# Patient Record
Sex: Male | Born: 1935 | Race: White | Hispanic: No | Marital: Single | Smoking: Former smoker
Health system: Southern US, Community
[De-identification: ages and names within clinical notes are randomized; demographics above are authoritative.]

## PROBLEM LIST (undated history)

## (undated) DIAGNOSIS — K8020 Calculus of gallbladder without cholecystitis without obstruction: Secondary | ICD-10-CM

## (undated) DIAGNOSIS — C320 Malignant neoplasm of glottis: Secondary | ICD-10-CM

## (undated) DIAGNOSIS — E785 Hyperlipidemia, unspecified: Secondary | ICD-10-CM

## (undated) DIAGNOSIS — D649 Anemia, unspecified: Secondary | ICD-10-CM

## (undated) DIAGNOSIS — M1990 Unspecified osteoarthritis, unspecified site: Secondary | ICD-10-CM

## (undated) DIAGNOSIS — K449 Diaphragmatic hernia without obstruction or gangrene: Secondary | ICD-10-CM

## (undated) DIAGNOSIS — I10 Essential (primary) hypertension: Secondary | ICD-10-CM

## (undated) DIAGNOSIS — J189 Pneumonia, unspecified organism: Secondary | ICD-10-CM

## (undated) DIAGNOSIS — Z923 Personal history of irradiation: Secondary | ICD-10-CM

## (undated) DIAGNOSIS — I4891 Unspecified atrial fibrillation: Secondary | ICD-10-CM

## (undated) DIAGNOSIS — I272 Pulmonary hypertension, unspecified: Secondary | ICD-10-CM

## (undated) DIAGNOSIS — H269 Unspecified cataract: Secondary | ICD-10-CM

## (undated) DIAGNOSIS — C61 Malignant neoplasm of prostate: Secondary | ICD-10-CM

## (undated) DIAGNOSIS — S72002A Fracture of unspecified part of neck of left femur, initial encounter for closed fracture: Secondary | ICD-10-CM

## (undated) DIAGNOSIS — K222 Esophageal obstruction: Secondary | ICD-10-CM

## (undated) DIAGNOSIS — C44310 Basal cell carcinoma of skin of unspecified parts of face: Secondary | ICD-10-CM

## (undated) DIAGNOSIS — I69991 Dysphagia following unspecified cerebrovascular disease: Secondary | ICD-10-CM

## (undated) DIAGNOSIS — Z87438 Personal history of other diseases of male genital organs: Secondary | ICD-10-CM

## (undated) DIAGNOSIS — E871 Hypo-osmolality and hyponatremia: Secondary | ICD-10-CM

## (undated) DIAGNOSIS — D61818 Other pancytopenia: Secondary | ICD-10-CM

## (undated) DIAGNOSIS — E876 Hypokalemia: Secondary | ICD-10-CM

## (undated) DIAGNOSIS — K219 Gastro-esophageal reflux disease without esophagitis: Secondary | ICD-10-CM

## (undated) HISTORY — PX: TONSILLECTOMY: SUR1361

## (undated) HISTORY — DX: Malignant neoplasm of glottis: C32.0

## (undated) HISTORY — PX: BACK SURGERY: SHX140

## (undated) HISTORY — DX: Unspecified cataract: H26.9

## (undated) HISTORY — DX: Hyperlipidemia, unspecified: E78.5

## (undated) HISTORY — DX: Other pancytopenia: D61.818

## (undated) HISTORY — DX: Unspecified atrial fibrillation: I48.91

## (undated) HISTORY — DX: Pneumonia, unspecified organism: J18.9

## (undated) HISTORY — DX: Hypo-osmolality and hyponatremia: E87.1

## (undated) HISTORY — PX: APPENDECTOMY: SHX54

## (undated) HISTORY — DX: Dysphagia following unspecified cerebrovascular disease: I69.991

## (undated) HISTORY — PX: OTHER SURGICAL HISTORY: SHX169

## (undated) HISTORY — DX: Unspecified osteoarthritis, unspecified site: M19.90

## (undated) HISTORY — DX: Calculus of gallbladder without cholecystitis without obstruction: K80.20

## (undated) HISTORY — PX: ESOPHAGEAL DILATION: SHX303

## (undated) HISTORY — DX: Personal history of irradiation: Z92.3

## (undated) HISTORY — DX: Esophageal obstruction: K22.2

## (undated) HISTORY — DX: Basal cell carcinoma of skin of unspecified parts of face: C44.310

## (undated) HISTORY — DX: Hypokalemia: E87.6

## (undated) HISTORY — DX: Anemia, unspecified: D64.9

## (undated) HISTORY — DX: Malignant neoplasm of prostate: C61

## (undated) HISTORY — DX: Other disorders of bilirubin metabolism: E80.6

## (undated) HISTORY — DX: Essential (primary) hypertension: I10

## (undated) HISTORY — PX: COLONOSCOPY: SHX174

## (undated) HISTORY — PX: HIP SURGERY: SHX245

## (undated) HISTORY — DX: Pulmonary hypertension, unspecified: I27.20

## (undated) HISTORY — DX: Diaphragmatic hernia without obstruction or gangrene: K44.9

## (undated) HISTORY — PX: UPPER GASTROINTESTINAL ENDOSCOPY: SHX188

## (undated) HISTORY — DX: Personal history of other diseases of male genital organs: Z87.438

## (undated) HISTORY — DX: Fracture of unspecified part of neck of left femur, initial encounter for closed fracture: S72.002A

## (undated) HISTORY — DX: Gastro-esophageal reflux disease without esophagitis: K21.9

---

## 2004-01-24 ENCOUNTER — Ambulatory Visit (HOSPITAL_COMMUNITY): Admission: RE | Admit: 2004-01-24 | Discharge: 2004-01-24 | Payer: Self-pay | Admitting: Neurological Surgery

## 2004-01-31 ENCOUNTER — Inpatient Hospital Stay (HOSPITAL_COMMUNITY): Admission: RE | Admit: 2004-01-31 | Discharge: 2004-02-01 | Payer: Self-pay | Admitting: Neurological Surgery

## 2004-04-19 ENCOUNTER — Ambulatory Visit (HOSPITAL_COMMUNITY): Admission: RE | Admit: 2004-04-19 | Discharge: 2004-04-19 | Payer: Self-pay | Admitting: Orthopedic Surgery

## 2004-04-19 ENCOUNTER — Ambulatory Visit (HOSPITAL_BASED_OUTPATIENT_CLINIC_OR_DEPARTMENT_OTHER): Admission: RE | Admit: 2004-04-19 | Discharge: 2004-04-19 | Payer: Self-pay | Admitting: Orthopedic Surgery

## 2005-06-13 ENCOUNTER — Ambulatory Visit (HOSPITAL_BASED_OUTPATIENT_CLINIC_OR_DEPARTMENT_OTHER): Admission: RE | Admit: 2005-06-13 | Discharge: 2005-06-13 | Payer: Self-pay | Admitting: Orthopedic Surgery

## 2006-06-02 ENCOUNTER — Encounter: Admission: RE | Admit: 2006-06-02 | Discharge: 2006-06-02 | Payer: Self-pay | Admitting: General Surgery

## 2006-12-16 ENCOUNTER — Inpatient Hospital Stay (HOSPITAL_COMMUNITY): Admission: RE | Admit: 2006-12-16 | Discharge: 2006-12-23 | Payer: Self-pay | Admitting: Orthopedic Surgery

## 2009-01-16 ENCOUNTER — Encounter (INDEPENDENT_AMBULATORY_CARE_PROVIDER_SITE_OTHER): Payer: Self-pay | Admitting: General Surgery

## 2009-01-16 ENCOUNTER — Ambulatory Visit (HOSPITAL_BASED_OUTPATIENT_CLINIC_OR_DEPARTMENT_OTHER): Admission: RE | Admit: 2009-01-16 | Discharge: 2009-01-16 | Payer: Self-pay | Admitting: General Surgery

## 2009-03-15 ENCOUNTER — Encounter: Admission: RE | Admit: 2009-03-15 | Discharge: 2009-03-15 | Payer: Self-pay | Admitting: General Surgery

## 2009-03-21 ENCOUNTER — Encounter: Admission: RE | Admit: 2009-03-21 | Discharge: 2009-03-21 | Payer: Self-pay | Admitting: General Surgery

## 2009-03-29 ENCOUNTER — Ambulatory Visit: Payer: Self-pay | Admitting: Thoracic Surgery

## 2009-04-04 ENCOUNTER — Ambulatory Visit (HOSPITAL_COMMUNITY): Admission: RE | Admit: 2009-04-04 | Discharge: 2009-04-04 | Payer: Self-pay | Admitting: Thoracic Surgery

## 2009-04-17 ENCOUNTER — Inpatient Hospital Stay (HOSPITAL_COMMUNITY): Admission: RE | Admit: 2009-04-17 | Discharge: 2009-04-23 | Payer: Self-pay | Admitting: Thoracic Surgery

## 2009-04-17 ENCOUNTER — Ambulatory Visit: Payer: Self-pay | Admitting: Thoracic Surgery

## 2009-04-17 ENCOUNTER — Encounter: Payer: Self-pay | Admitting: Thoracic Surgery

## 2009-04-20 ENCOUNTER — Ambulatory Visit: Payer: Self-pay | Admitting: Oncology

## 2009-04-21 ENCOUNTER — Ambulatory Visit: Payer: Self-pay | Admitting: Oncology

## 2009-04-27 ENCOUNTER — Encounter: Admission: RE | Admit: 2009-04-27 | Discharge: 2009-04-27 | Payer: Self-pay | Admitting: Thoracic Surgery

## 2009-04-27 ENCOUNTER — Ambulatory Visit: Payer: Self-pay | Admitting: Thoracic Surgery

## 2009-05-05 ENCOUNTER — Encounter: Payer: Self-pay | Admitting: Oncology

## 2009-05-05 ENCOUNTER — Other Ambulatory Visit: Admission: RE | Admit: 2009-05-05 | Discharge: 2009-05-05 | Payer: Self-pay | Admitting: Oncology

## 2009-05-17 ENCOUNTER — Encounter: Admission: RE | Admit: 2009-05-17 | Discharge: 2009-05-17 | Payer: Self-pay | Admitting: Thoracic Surgery

## 2009-05-17 ENCOUNTER — Ambulatory Visit: Payer: Self-pay | Admitting: Thoracic Surgery

## 2009-06-20 ENCOUNTER — Ambulatory Visit: Payer: Self-pay | Admitting: Oncology

## 2009-06-28 ENCOUNTER — Ambulatory Visit: Payer: Self-pay | Admitting: Thoracic Surgery

## 2009-06-28 ENCOUNTER — Encounter: Admission: RE | Admit: 2009-06-28 | Discharge: 2009-06-28 | Payer: Self-pay | Admitting: Thoracic Surgery

## 2009-07-04 LAB — CBC WITH DIFFERENTIAL/PLATELET
BASO%: 0.3 % (ref 0.0–2.0)
HCT: 38.2 % — ABNORMAL LOW (ref 38.4–49.9)
MCHC: 34.5 g/dL (ref 32.0–36.0)
MONO#: 0.6 10*3/uL (ref 0.1–0.9)
NEUT%: 58.9 % (ref 39.0–75.0)
RBC: 4.13 10*6/uL — ABNORMAL LOW (ref 4.20–5.82)
RDW: 14.2 % (ref 11.0–14.6)
WBC: 6 10*3/uL (ref 4.0–10.3)
lymph#: 1.8 10*3/uL (ref 0.9–3.3)

## 2009-07-05 LAB — COMPREHENSIVE METABOLIC PANEL
BUN: 18 mg/dL (ref 6–23)
CO2: 25 mEq/L (ref 19–32)
Calcium: 9.9 mg/dL (ref 8.4–10.5)
Chloride: 107 mEq/L (ref 96–112)
Creatinine, Ser: 0.94 mg/dL (ref 0.40–1.50)

## 2009-07-05 LAB — KAPPA/LAMBDA LIGHT CHAINS
Kappa free light chain: 0.7 mg/dL (ref 0.33–1.94)
Kappa:Lambda Ratio: 0 — ABNORMAL LOW (ref 0.26–1.65)

## 2009-08-29 ENCOUNTER — Ambulatory Visit: Payer: Self-pay | Admitting: Oncology

## 2009-08-29 LAB — CBC WITH DIFFERENTIAL/PLATELET
BASO%: 0.2 % (ref 0.0–2.0)
EOS%: 1.3 % (ref 0.0–7.0)
Eosinophils Absolute: 0.1 10*3/uL (ref 0.0–0.5)
LYMPH%: 27 % (ref 14.0–49.0)
MCH: 32.3 pg (ref 27.2–33.4)
MCHC: 35.1 g/dL (ref 32.0–36.0)
MCV: 92.2 fL (ref 79.3–98.0)
MONO%: 12.6 % (ref 0.0–14.0)
Platelets: 164 10*3/uL (ref 140–400)
RBC: 3.73 10*6/uL — ABNORMAL LOW (ref 4.20–5.82)

## 2009-08-30 ENCOUNTER — Ambulatory Visit (HOSPITAL_COMMUNITY): Admission: RE | Admit: 2009-08-30 | Discharge: 2009-08-30 | Payer: Self-pay | Admitting: Oncology

## 2009-08-30 LAB — COMPREHENSIVE METABOLIC PANEL
CO2: 26 mEq/L (ref 19–32)
Creatinine, Ser: 1.23 mg/dL (ref 0.40–1.50)
Glucose, Bld: 93 mg/dL (ref 70–99)
Sodium: 137 mEq/L (ref 135–145)
Total Bilirubin: 0.9 mg/dL (ref 0.3–1.2)
Total Protein: 7.1 g/dL (ref 6.0–8.3)

## 2009-08-30 LAB — KAPPA/LAMBDA LIGHT CHAINS
Kappa free light chain: 0.65 mg/dL (ref 0.33–1.94)
Lambda Free Lght Chn: 298 mg/dL — ABNORMAL HIGH (ref 0.57–2.63)

## 2009-09-05 LAB — BETA 2 MICROGLOBULIN, SERUM: Beta-2 Microglobulin: 5.36 mg/L — ABNORMAL HIGH (ref 1.01–1.73)

## 2009-09-05 LAB — BASIC METABOLIC PANEL
BUN: 11 mg/dL (ref 6–23)
Potassium: 4.1 mEq/L (ref 3.5–5.3)

## 2009-09-27 ENCOUNTER — Encounter: Admission: RE | Admit: 2009-09-27 | Discharge: 2009-09-27 | Payer: Self-pay | Admitting: Thoracic Surgery

## 2009-09-27 ENCOUNTER — Ambulatory Visit: Payer: Self-pay | Admitting: Thoracic Surgery

## 2009-09-27 LAB — CBC WITH DIFFERENTIAL/PLATELET
Basophils Absolute: 0 10*3/uL (ref 0.0–0.1)
Eosinophils Absolute: 0.4 10*3/uL (ref 0.0–0.5)
HGB: 12 g/dL — ABNORMAL LOW (ref 13.0–17.1)
LYMPH%: 18.2 % (ref 14.0–49.0)
MCH: 30.9 pg (ref 27.2–33.4)
MCV: 92.3 fL (ref 79.3–98.0)
MONO%: 15.9 % — ABNORMAL HIGH (ref 0.0–14.0)
NEUT#: 5.4 10*3/uL (ref 1.5–6.5)
NEUT%: 61.6 % (ref 39.0–75.0)
Platelets: 129 10*3/uL — ABNORMAL LOW (ref 140–400)

## 2009-10-11 ENCOUNTER — Ambulatory Visit: Payer: Self-pay | Admitting: Oncology

## 2009-10-13 LAB — CBC WITH DIFFERENTIAL/PLATELET
Basophils Absolute: 0 10*3/uL (ref 0.0–0.1)
EOS%: 0 % (ref 0.0–7.0)
Eosinophils Absolute: 0 10*3/uL (ref 0.0–0.5)
HCT: 34.2 % — ABNORMAL LOW (ref 38.4–49.9)
HGB: 11.8 g/dL — ABNORMAL LOW (ref 13.0–17.1)
LYMPH%: 9.4 % — ABNORMAL LOW (ref 14.0–49.0)
MCH: 32.2 pg (ref 27.2–33.4)
MCV: 93.3 fL (ref 79.3–98.0)
MONO%: 7.2 % (ref 0.0–14.0)
NEUT#: 12.9 10*3/uL — ABNORMAL HIGH (ref 1.5–6.5)
NEUT%: 83.3 % — ABNORMAL HIGH (ref 39.0–75.0)
Platelets: 228 10*3/uL (ref 140–400)
RDW: 16.5 % — ABNORMAL HIGH (ref 11.0–14.6)

## 2009-10-30 LAB — CBC WITH DIFFERENTIAL/PLATELET
EOS%: 14.5 % — ABNORMAL HIGH (ref 0.0–7.0)
Eosinophils Absolute: 1 10*3/uL — ABNORMAL HIGH (ref 0.0–0.5)
LYMPH%: 27.4 % (ref 14.0–49.0)
MCH: 31.8 pg (ref 27.2–33.4)
MCV: 93.7 fL (ref 79.3–98.0)
MONO%: 18.2 % — ABNORMAL HIGH (ref 0.0–14.0)
NEUT#: 2.7 10*3/uL (ref 1.5–6.5)
Platelets: 104 10*3/uL — ABNORMAL LOW (ref 140–400)
RBC: 3.95 10*6/uL — ABNORMAL LOW (ref 4.20–5.82)
RDW: 16.3 % — ABNORMAL HIGH (ref 11.0–14.6)

## 2009-10-30 LAB — COMPREHENSIVE METABOLIC PANEL
AST: 15 U/L (ref 0–37)
Alkaline Phosphatase: 81 U/L (ref 39–117)
BUN: 24 mg/dL — ABNORMAL HIGH (ref 6–23)
Glucose, Bld: 92 mg/dL (ref 70–99)
Sodium: 139 mEq/L (ref 135–145)
Total Bilirubin: 0.8 mg/dL (ref 0.3–1.2)

## 2009-11-08 ENCOUNTER — Ambulatory Visit: Payer: Self-pay | Admitting: Oncology

## 2009-11-08 LAB — CBC WITH DIFFERENTIAL/PLATELET
BASO%: 0.5 % (ref 0.0–2.0)
EOS%: 0.5 % (ref 0.0–7.0)
HCT: 36.2 % — ABNORMAL LOW (ref 38.4–49.9)
HGB: 12.3 g/dL — ABNORMAL LOW (ref 13.0–17.1)
MCH: 31.8 pg (ref 27.2–33.4)
MCHC: 34.1 g/dL (ref 32.0–36.0)
MONO#: 0.8 10*3/uL (ref 0.1–0.9)
NEUT%: 55.6 % (ref 39.0–75.0)
RDW: 16.2 % — ABNORMAL HIGH (ref 11.0–14.6)
WBC: 6.8 10*3/uL (ref 4.0–10.3)
lymph#: 2.2 10*3/uL (ref 0.9–3.3)

## 2009-11-09 LAB — BETA 2 MICROGLOBULIN, SERUM: Beta-2 Microglobulin: 2.24 mg/L — ABNORMAL HIGH (ref 1.01–1.73)

## 2009-11-09 LAB — KAPPA/LAMBDA LIGHT CHAINS
Kappa free light chain: 1.84 mg/dL (ref 0.33–1.94)
Kappa:Lambda Ratio: 0.3 (ref 0.26–1.65)
Lambda Free Lght Chn: 6.04 mg/dL — ABNORMAL HIGH (ref 0.57–2.63)

## 2009-11-30 LAB — BASIC METABOLIC PANEL
CO2: 27 mEq/L (ref 19–32)
Chloride: 101 mEq/L (ref 96–112)
Creatinine, Ser: 0.99 mg/dL (ref 0.40–1.50)
Potassium: 3.7 mEq/L (ref 3.5–5.3)
Sodium: 135 mEq/L (ref 135–145)

## 2009-11-30 LAB — CBC WITH DIFFERENTIAL/PLATELET
BASO%: 0.5 % (ref 0.0–2.0)
Basophils Absolute: 0 10*3/uL (ref 0.0–0.1)
EOS%: 6.6 % (ref 0.0–7.0)
HGB: 12.7 g/dL — ABNORMAL LOW (ref 13.0–17.1)
MCH: 32 pg (ref 27.2–33.4)
MCHC: 34.4 g/dL (ref 32.0–36.0)
MCV: 93.1 fL (ref 79.3–98.0)
MONO%: 15.1 % — ABNORMAL HIGH (ref 0.0–14.0)
RBC: 3.98 10*6/uL — ABNORMAL LOW (ref 4.20–5.82)
RDW: 16.6 % — ABNORMAL HIGH (ref 11.0–14.6)
lymph#: 1.4 10*3/uL (ref 0.9–3.3)

## 2009-12-29 ENCOUNTER — Ambulatory Visit: Payer: Self-pay | Admitting: Oncology

## 2009-12-29 LAB — CBC WITH DIFFERENTIAL/PLATELET
BASO%: 0.2 % (ref 0.0–2.0)
EOS%: 7 % (ref 0.0–7.0)
LYMPH%: 36.2 % (ref 14.0–49.0)
MCHC: 33.2 g/dL (ref 32.0–36.0)
MCV: 92.3 fL (ref 79.3–98.0)
MONO%: 23.7 % — ABNORMAL HIGH (ref 0.0–14.0)
Platelets: 117 10*3/uL — ABNORMAL LOW (ref 140–400)
RBC: 4.04 10*6/uL — ABNORMAL LOW (ref 4.20–5.82)
WBC: 4 10*3/uL (ref 4.0–10.3)
nRBC: 0 % (ref 0–0)

## 2010-01-01 LAB — KAPPA/LAMBDA LIGHT CHAINS: Kappa free light chain: 1.78 mg/dL (ref 0.33–1.94)

## 2010-01-01 LAB — COMPREHENSIVE METABOLIC PANEL
ALT: 20 U/L (ref 0–53)
AST: 16 U/L (ref 0–37)
Alkaline Phosphatase: 94 U/L (ref 39–117)
Creatinine, Ser: 1.1 mg/dL (ref 0.40–1.50)
Total Bilirubin: 0.8 mg/dL (ref 0.3–1.2)

## 2010-01-03 LAB — CBC WITH DIFFERENTIAL/PLATELET
Eosinophils Absolute: 0.1 10*3/uL (ref 0.0–0.5)
HCT: 34.7 % — ABNORMAL LOW (ref 38.4–49.9)
LYMPH%: 30.9 % (ref 14.0–49.0)
MONO#: 0.6 10*3/uL (ref 0.1–0.9)
NEUT#: 3.4 10*3/uL (ref 1.5–6.5)
Platelets: 149 10*3/uL (ref 140–400)
RBC: 3.81 10*6/uL — ABNORMAL LOW (ref 4.20–5.82)
WBC: 6 10*3/uL (ref 4.0–10.3)

## 2010-01-24 ENCOUNTER — Ambulatory Visit: Payer: Self-pay | Admitting: Thoracic Surgery

## 2010-01-24 ENCOUNTER — Encounter: Admission: RE | Admit: 2010-01-24 | Discharge: 2010-01-24 | Payer: Self-pay | Admitting: Thoracic Surgery

## 2010-01-26 LAB — CBC WITH DIFFERENTIAL/PLATELET
BASO%: 0.1 % (ref 0.0–2.0)
HCT: 34.4 % — ABNORMAL LOW (ref 38.4–49.9)
LYMPH%: 31.1 % (ref 14.0–49.0)
MCH: 32.4 pg (ref 27.2–33.4)
MCHC: 34.8 g/dL (ref 32.0–36.0)
MCV: 92.9 fL (ref 79.3–98.0)
MONO#: 0.6 10*3/uL (ref 0.1–0.9)
MONO%: 17.2 % — ABNORMAL HIGH (ref 0.0–14.0)
NEUT%: 46.3 % (ref 39.0–75.0)
Platelets: 97 10*3/uL — ABNORMAL LOW (ref 140–400)
WBC: 3.5 10*3/uL — ABNORMAL LOW (ref 4.0–10.3)

## 2010-01-26 LAB — COMPREHENSIVE METABOLIC PANEL
ALT: 20 U/L (ref 0–53)
CO2: 28 mEq/L (ref 19–32)
Creatinine, Ser: 1.01 mg/dL (ref 0.40–1.50)
Glucose, Bld: 98 mg/dL (ref 70–99)
Total Bilirubin: 0.8 mg/dL (ref 0.3–1.2)

## 2010-01-29 ENCOUNTER — Ambulatory Visit: Payer: Self-pay | Admitting: Oncology

## 2010-01-31 LAB — CBC WITH DIFFERENTIAL/PLATELET
Eosinophils Absolute: 0.1 10*3/uL (ref 0.0–0.5)
HCT: 35.5 % — ABNORMAL LOW (ref 38.4–49.9)
LYMPH%: 32 % (ref 14.0–49.0)
MONO#: 0.6 10*3/uL (ref 0.1–0.9)
NEUT#: 2.9 10*3/uL (ref 1.5–6.5)
NEUT%: 55.5 % (ref 39.0–75.0)
Platelets: 145 10*3/uL (ref 140–400)
WBC: 5.3 10*3/uL (ref 4.0–10.3)

## 2010-02-19 ENCOUNTER — Ambulatory Visit (HOSPITAL_COMMUNITY): Admission: RE | Admit: 2010-02-19 | Discharge: 2010-02-19 | Payer: Self-pay | Admitting: Oncology

## 2010-02-26 LAB — CBC WITH DIFFERENTIAL/PLATELET
BASO%: 0.4 % (ref 0.0–2.0)
EOS%: 4.4 % (ref 0.0–7.0)
MCH: 33.2 pg (ref 27.2–33.4)
MCHC: 35.5 g/dL (ref 32.0–36.0)
MONO#: 0.8 10*3/uL (ref 0.1–0.9)
RDW: 18.5 % — ABNORMAL HIGH (ref 11.0–14.6)
WBC: 4.8 10*3/uL (ref 4.0–10.3)
lymph#: 1.3 10*3/uL (ref 0.9–3.3)

## 2010-02-26 LAB — COMPREHENSIVE METABOLIC PANEL
ALT: 23 U/L (ref 0–53)
AST: 17 U/L (ref 0–37)
Albumin: 3.4 g/dL — ABNORMAL LOW (ref 3.5–5.2)
CO2: 29 mEq/L (ref 19–32)
Calcium: 9.2 mg/dL (ref 8.4–10.5)
Chloride: 106 mEq/L (ref 96–112)
Potassium: 3.6 mEq/L (ref 3.5–5.3)
Sodium: 139 mEq/L (ref 135–145)
Total Protein: 6 g/dL (ref 6.0–8.3)

## 2010-03-15 ENCOUNTER — Ambulatory Visit: Payer: Self-pay | Admitting: Oncology

## 2010-03-19 LAB — BASIC METABOLIC PANEL
CO2: 24 mEq/L (ref 19–32)
Calcium: 9.5 mg/dL (ref 8.4–10.5)
Creatinine, Ser: 1.13 mg/dL (ref 0.40–1.50)
Glucose, Bld: 88 mg/dL (ref 70–99)
Sodium: 135 mEq/L (ref 135–145)

## 2010-03-19 LAB — CBC WITH DIFFERENTIAL/PLATELET
BASO%: 0.5 % (ref 0.0–2.0)
Eosinophils Absolute: 0.1 10*3/uL (ref 0.0–0.5)
HCT: 34.8 % — ABNORMAL LOW (ref 38.4–49.9)
LYMPH%: 34.5 % (ref 14.0–49.0)
MCHC: 34.8 g/dL (ref 32.0–36.0)
MCV: 93.7 fL (ref 79.3–98.0)
MONO#: 0.6 10*3/uL (ref 0.1–0.9)
MONO%: 14.5 % — ABNORMAL HIGH (ref 0.0–14.0)
NEUT%: 49.1 % (ref 39.0–75.0)
Platelets: 108 10*3/uL — ABNORMAL LOW (ref 140–400)
RBC: 3.71 10*6/uL — ABNORMAL LOW (ref 4.20–5.82)
WBC: 4.3 10*3/uL (ref 4.0–10.3)

## 2010-04-09 LAB — COMPREHENSIVE METABOLIC PANEL
ALT: 23 U/L (ref 0–53)
AST: 16 U/L (ref 0–37)
Alkaline Phosphatase: 59 U/L (ref 39–117)
CO2: 28 mEq/L (ref 19–32)
Creatinine, Ser: 0.95 mg/dL (ref 0.40–1.50)
Sodium: 137 mEq/L (ref 135–145)
Total Bilirubin: 0.8 mg/dL (ref 0.3–1.2)
Total Protein: 6 g/dL (ref 6.0–8.3)

## 2010-04-09 LAB — CBC WITH DIFFERENTIAL/PLATELET
BASO%: 0.2 % (ref 0.0–2.0)
EOS%: 4.2 % (ref 0.0–7.0)
LYMPH%: 33.5 % (ref 14.0–49.0)
MCH: 32.9 pg (ref 27.2–33.4)
MCHC: 34.7 g/dL (ref 32.0–36.0)
MONO#: 0.6 10*3/uL (ref 0.1–0.9)
Platelets: 99 10*3/uL — ABNORMAL LOW (ref 140–400)
RBC: 3.72 10*6/uL — ABNORMAL LOW (ref 4.20–5.82)
WBC: 4.4 10*3/uL (ref 4.0–10.3)

## 2010-04-10 LAB — KAPPA/LAMBDA LIGHT CHAINS: Kappa free light chain: 1.11 mg/dL (ref 0.33–1.94)

## 2010-04-18 ENCOUNTER — Ambulatory Visit: Payer: Self-pay | Admitting: Oncology

## 2010-04-19 LAB — CBC WITH DIFFERENTIAL/PLATELET
Basophils Absolute: 0 10*3/uL (ref 0.0–0.1)
Eosinophils Absolute: 0 10*3/uL (ref 0.0–0.5)
HCT: 37.4 % — ABNORMAL LOW (ref 38.4–49.9)
HGB: 12.7 g/dL — ABNORMAL LOW (ref 13.0–17.1)
LYMPH%: 17 % (ref 14.0–49.0)
MCV: 93.7 fL (ref 79.3–98.0)
MONO#: 0 10*3/uL — ABNORMAL LOW (ref 0.1–0.9)
MONO%: 0.8 % (ref 0.0–14.0)
NEUT#: 3.3 10*3/uL (ref 1.5–6.5)
NEUT%: 82 % — ABNORMAL HIGH (ref 39.0–75.0)
Platelets: 143 10*3/uL (ref 140–400)
WBC: 4 10*3/uL (ref 4.0–10.3)

## 2010-05-15 LAB — BASIC METABOLIC PANEL
BUN: 12 mg/dL (ref 6–23)
Chloride: 103 mEq/L (ref 96–112)
Glucose, Bld: 105 mg/dL — ABNORMAL HIGH (ref 70–99)
Potassium: 4 mEq/L (ref 3.5–5.3)

## 2010-05-15 LAB — CBC WITH DIFFERENTIAL/PLATELET
Basophils Absolute: 0 10*3/uL (ref 0.0–0.1)
Eosinophils Absolute: 0 10*3/uL (ref 0.0–0.5)
HGB: 10.8 g/dL — ABNORMAL LOW (ref 13.0–17.1)
MCV: 92.6 fL (ref 79.3–98.0)
MONO#: 0.5 10*3/uL (ref 0.1–0.9)
NEUT#: 2.6 10*3/uL (ref 1.5–6.5)
RDW: 17.4 % — ABNORMAL HIGH (ref 11.0–14.6)
WBC: 4.3 10*3/uL (ref 4.0–10.3)
lymph#: 1.1 10*3/uL (ref 0.9–3.3)

## 2010-05-30 ENCOUNTER — Ambulatory Visit: Payer: Self-pay | Admitting: Oncology

## 2010-06-01 LAB — CBC WITH DIFFERENTIAL/PLATELET
BASO%: 0.1 % (ref 0.0–2.0)
Eosinophils Absolute: 0 10*3/uL (ref 0.0–0.5)
HCT: 35.2 % — ABNORMAL LOW (ref 38.4–49.9)
LYMPH%: 11.1 % — ABNORMAL LOW (ref 14.0–49.0)
MCHC: 34.2 g/dL (ref 32.0–36.0)
MCV: 94.9 fL (ref 79.3–98.0)
MONO%: 8.8 % (ref 0.0–14.0)
NEUT%: 80 % — ABNORMAL HIGH (ref 39.0–75.0)
Platelets: 120 10*3/uL — ABNORMAL LOW (ref 140–400)
RBC: 3.71 10*6/uL — ABNORMAL LOW (ref 4.20–5.82)

## 2010-06-04 ENCOUNTER — Encounter (INDEPENDENT_AMBULATORY_CARE_PROVIDER_SITE_OTHER): Payer: Self-pay | Admitting: *Deleted

## 2010-06-04 ENCOUNTER — Encounter: Admission: RE | Admit: 2010-06-04 | Discharge: 2010-06-04 | Payer: Self-pay | Admitting: Otolaryngology

## 2010-06-12 LAB — CBC WITH DIFFERENTIAL/PLATELET
Eosinophils Absolute: 0.1 10*3/uL (ref 0.0–0.5)
LYMPH%: 28.3 % (ref 14.0–49.0)
MCHC: 34.6 g/dL (ref 32.0–36.0)
MCV: 94 fL (ref 79.3–98.0)
MONO%: 11.6 % (ref 0.0–14.0)
NEUT#: 2.6 10*3/uL (ref 1.5–6.5)
Platelets: 117 10*3/uL — ABNORMAL LOW (ref 140–400)
RBC: 3.51 10*6/uL — ABNORMAL LOW (ref 4.20–5.82)

## 2010-06-13 LAB — COMPREHENSIVE METABOLIC PANEL
Alkaline Phosphatase: 51 U/L (ref 39–117)
Glucose, Bld: 119 mg/dL — ABNORMAL HIGH (ref 70–99)
Sodium: 138 mEq/L (ref 135–145)
Total Bilirubin: 0.7 mg/dL (ref 0.3–1.2)
Total Protein: 6.2 g/dL (ref 6.0–8.3)

## 2010-06-13 LAB — KAPPA/LAMBDA LIGHT CHAINS
Kappa:Lambda Ratio: 0.7 (ref 0.26–1.65)
Lambda Free Lght Chn: 2 mg/dL (ref 0.57–2.63)

## 2010-07-04 ENCOUNTER — Encounter (INDEPENDENT_AMBULATORY_CARE_PROVIDER_SITE_OTHER): Payer: Self-pay | Admitting: *Deleted

## 2010-07-09 ENCOUNTER — Ambulatory Visit: Payer: Self-pay | Admitting: Oncology

## 2010-07-11 LAB — BASIC METABOLIC PANEL
BUN: 14 mg/dL (ref 6–23)
CO2: 22 mEq/L (ref 19–32)
Chloride: 104 mEq/L (ref 96–112)
Glucose, Bld: 90 mg/dL (ref 70–99)
Potassium: 4.5 mEq/L (ref 3.5–5.3)

## 2010-07-11 LAB — CBC WITH DIFFERENTIAL/PLATELET
Basophils Absolute: 0 10*3/uL (ref 0.0–0.1)
Eosinophils Absolute: 0.1 10*3/uL (ref 0.0–0.5)
HGB: 10.6 g/dL — ABNORMAL LOW (ref 13.0–17.1)
MONO#: 0.4 10*3/uL (ref 0.1–0.9)
NEUT#: 2.2 10*3/uL (ref 1.5–6.5)
RDW: 18.2 % — ABNORMAL HIGH (ref 11.0–14.6)
lymph#: 1.2 10*3/uL (ref 0.9–3.3)

## 2010-07-26 ENCOUNTER — Encounter (INDEPENDENT_AMBULATORY_CARE_PROVIDER_SITE_OTHER): Payer: Self-pay | Admitting: *Deleted

## 2010-07-26 ENCOUNTER — Inpatient Hospital Stay (HOSPITAL_COMMUNITY): Admission: EM | Admit: 2010-07-26 | Discharge: 2010-08-03 | Payer: Self-pay | Admitting: Emergency Medicine

## 2010-07-26 ENCOUNTER — Encounter: Payer: Self-pay | Admitting: Cardiology

## 2010-07-26 ENCOUNTER — Ambulatory Visit: Payer: Self-pay | Admitting: Pulmonary Disease

## 2010-07-26 ENCOUNTER — Encounter: Payer: Self-pay | Admitting: Internal Medicine

## 2010-07-27 ENCOUNTER — Ambulatory Visit: Payer: Self-pay | Admitting: Thoracic Surgery

## 2010-07-29 ENCOUNTER — Encounter (INDEPENDENT_AMBULATORY_CARE_PROVIDER_SITE_OTHER): Payer: Self-pay | Admitting: Internal Medicine

## 2010-07-30 ENCOUNTER — Encounter: Payer: Self-pay | Admitting: Internal Medicine

## 2010-07-30 ENCOUNTER — Encounter (INDEPENDENT_AMBULATORY_CARE_PROVIDER_SITE_OTHER): Payer: Self-pay | Admitting: Pulmonary Disease

## 2010-08-02 ENCOUNTER — Ambulatory Visit: Payer: Self-pay | Admitting: Oncology

## 2010-08-03 ENCOUNTER — Encounter: Payer: Self-pay | Admitting: Internal Medicine

## 2010-08-06 ENCOUNTER — Encounter: Payer: Self-pay | Admitting: Cardiology

## 2010-08-09 ENCOUNTER — Ambulatory Visit: Payer: Self-pay | Admitting: Oncology

## 2010-08-09 ENCOUNTER — Encounter: Payer: Self-pay | Admitting: Cardiology

## 2010-08-09 LAB — CBC WITH DIFFERENTIAL/PLATELET
Basophils Absolute: 0 10*3/uL (ref 0.0–0.1)
EOS%: 0.8 % (ref 0.0–7.0)
HCT: 29.6 % — ABNORMAL LOW (ref 38.4–49.9)
HGB: 10.3 g/dL — ABNORMAL LOW (ref 13.0–17.1)
MCH: 33 pg (ref 27.2–33.4)
NEUT%: 63.3 % (ref 39.0–75.0)
lymph#: 1 10*3/uL (ref 0.9–3.3)

## 2010-08-10 ENCOUNTER — Ambulatory Visit: Payer: Self-pay | Admitting: Cardiology

## 2010-08-10 ENCOUNTER — Telehealth: Payer: Self-pay | Admitting: Cardiology

## 2010-08-10 DIAGNOSIS — R011 Cardiac murmur, unspecified: Secondary | ICD-10-CM

## 2010-08-10 DIAGNOSIS — I4891 Unspecified atrial fibrillation: Secondary | ICD-10-CM

## 2010-08-10 DIAGNOSIS — I2789 Other specified pulmonary heart diseases: Secondary | ICD-10-CM

## 2010-08-10 LAB — COMPREHENSIVE METABOLIC PANEL
AST: 12 U/L (ref 0–37)
BUN: 9 mg/dL (ref 6–23)
CO2: 29 mEq/L (ref 19–32)
Calcium: 9.2 mg/dL (ref 8.4–10.5)
Chloride: 103 mEq/L (ref 96–112)
Creatinine, Ser: 1.16 mg/dL (ref 0.40–1.50)
Glucose, Bld: 87 mg/dL (ref 70–99)

## 2010-08-10 LAB — KAPPA/LAMBDA LIGHT CHAINS
Kappa free light chain: 2.29 mg/dL — ABNORMAL HIGH (ref 0.33–1.94)
Kappa:Lambda Ratio: 0.65 (ref 0.26–1.65)
Lambda Free Lght Chn: 3.52 mg/dL — ABNORMAL HIGH (ref 0.57–2.63)

## 2010-08-21 ENCOUNTER — Encounter: Admission: RE | Admit: 2010-08-21 | Discharge: 2010-08-21 | Payer: Self-pay | Admitting: Thoracic Surgery

## 2010-08-21 ENCOUNTER — Ambulatory Visit: Payer: Self-pay | Admitting: Thoracic Surgery

## 2010-08-22 ENCOUNTER — Encounter: Admission: RE | Admit: 2010-08-22 | Discharge: 2010-08-23 | Payer: Self-pay | Admitting: Family Medicine

## 2010-08-23 DIAGNOSIS — K8020 Calculus of gallbladder without cholecystitis without obstruction: Secondary | ICD-10-CM | POA: Insufficient documentation

## 2010-08-23 DIAGNOSIS — K219 Gastro-esophageal reflux disease without esophagitis: Secondary | ICD-10-CM | POA: Insufficient documentation

## 2010-08-24 ENCOUNTER — Telehealth (INDEPENDENT_AMBULATORY_CARE_PROVIDER_SITE_OTHER): Payer: Self-pay | Admitting: *Deleted

## 2010-08-28 ENCOUNTER — Ambulatory Visit: Payer: Self-pay | Admitting: Internal Medicine

## 2010-08-28 ENCOUNTER — Encounter: Payer: Self-pay | Admitting: Cardiology

## 2010-08-28 DIAGNOSIS — R1310 Dysphagia, unspecified: Secondary | ICD-10-CM | POA: Insufficient documentation

## 2010-08-28 DIAGNOSIS — K222 Esophageal obstruction: Secondary | ICD-10-CM | POA: Insufficient documentation

## 2010-08-28 DIAGNOSIS — R933 Abnormal findings on diagnostic imaging of other parts of digestive tract: Secondary | ICD-10-CM

## 2010-09-11 ENCOUNTER — Ambulatory Visit: Payer: Self-pay | Admitting: Oncology

## 2010-09-11 ENCOUNTER — Encounter: Payer: Self-pay | Admitting: Internal Medicine

## 2010-09-11 LAB — CBC WITH DIFFERENTIAL/PLATELET
BASO%: 0.3 % (ref 0.0–2.0)
Basophils Absolute: 0 10*3/uL (ref 0.0–0.1)
Eosinophils Absolute: 0 10*3/uL (ref 0.0–0.5)
HCT: 34 % — ABNORMAL LOW (ref 38.4–49.9)
LYMPH%: 30.1 % (ref 14.0–49.0)
MCHC: 35 g/dL (ref 32.0–36.0)
MONO#: 0.4 10*3/uL (ref 0.1–0.9)
NEUT%: 60.1 % (ref 39.0–75.0)
Platelets: 126 10*3/uL — ABNORMAL LOW (ref 140–400)
WBC: 4 10*3/uL (ref 4.0–10.3)

## 2010-09-13 LAB — PROTEIN ELECTROPHORESIS, SERUM
Albumin ELP: 60.9 % (ref 55.8–66.1)
Alpha-1-Globulin: 4.3 % (ref 2.9–4.9)
Beta 2: 3.6 % (ref 3.2–6.5)
Beta Globulin: 5.5 % (ref 4.7–7.2)

## 2010-09-13 LAB — COMPREHENSIVE METABOLIC PANEL
ALT: 20 U/L (ref 0–53)
AST: 22 U/L (ref 0–37)
Alkaline Phosphatase: 49 U/L (ref 39–117)
Sodium: 140 mEq/L (ref 135–145)
Total Bilirubin: 0.7 mg/dL (ref 0.3–1.2)
Total Protein: 6.2 g/dL (ref 6.0–8.3)

## 2010-09-17 ENCOUNTER — Telehealth: Payer: Self-pay | Admitting: Internal Medicine

## 2010-10-01 ENCOUNTER — Ambulatory Visit: Payer: Self-pay | Admitting: Internal Medicine

## 2010-10-01 ENCOUNTER — Ambulatory Visit (HOSPITAL_COMMUNITY): Admission: RE | Admit: 2010-10-01 | Discharge: 2010-10-01 | Payer: Self-pay | Admitting: Internal Medicine

## 2010-10-09 ENCOUNTER — Encounter: Payer: Self-pay | Admitting: Cardiology

## 2010-10-10 ENCOUNTER — Telehealth: Payer: Self-pay | Admitting: Internal Medicine

## 2010-10-10 ENCOUNTER — Ambulatory Visit: Payer: Self-pay | Admitting: Cardiology

## 2010-10-10 DIAGNOSIS — I10 Essential (primary) hypertension: Secondary | ICD-10-CM | POA: Insufficient documentation

## 2010-10-18 ENCOUNTER — Ambulatory Visit: Payer: Self-pay | Admitting: Oncology

## 2010-10-22 ENCOUNTER — Encounter: Payer: Self-pay | Admitting: Cardiology

## 2010-10-22 LAB — CBC WITH DIFFERENTIAL/PLATELET
BASO%: 0.2 % (ref 0.0–2.0)
EOS%: 0.9 % (ref 0.0–7.0)
HCT: 36.3 % — ABNORMAL LOW (ref 38.4–49.9)
LYMPH%: 20.6 % (ref 14.0–49.0)
MCH: 33.9 pg — ABNORMAL HIGH (ref 27.2–33.4)
MCHC: 36.1 g/dL — ABNORMAL HIGH (ref 32.0–36.0)
MCV: 93.8 fL (ref 79.3–98.0)
NEUT%: 67.5 % (ref 39.0–75.0)
Platelets: 127 10*3/uL — ABNORMAL LOW (ref 140–400)

## 2010-10-23 LAB — COMPREHENSIVE METABOLIC PANEL
ALT: 22 U/L (ref 0–53)
AST: 21 U/L (ref 0–37)
CO2: 27 mEq/L (ref 19–32)
Creatinine, Ser: 1.27 mg/dL (ref 0.40–1.50)
Total Bilirubin: 0.9 mg/dL (ref 0.3–1.2)

## 2010-10-23 LAB — KAPPA/LAMBDA LIGHT CHAINS: Kappa free light chain: 1.1 mg/dL (ref 0.33–1.94)

## 2010-11-29 ENCOUNTER — Ambulatory Visit: Payer: Self-pay | Admitting: Oncology

## 2010-12-04 ENCOUNTER — Encounter: Payer: Self-pay | Admitting: Internal Medicine

## 2010-12-19 ENCOUNTER — Telehealth: Payer: Self-pay | Admitting: Cardiology

## 2011-01-08 LAB — BASIC METABOLIC PANEL
Calcium: 10.1 mg/dL (ref 8.4–10.5)
Creatinine, Ser: 1.19 mg/dL (ref 0.4–1.5)
GFR calc Af Amer: >60 mL/min (ref >60)
GFR calc non Af Amer: 60 mL/min — ABNORMAL LOW (ref >60)

## 2011-01-08 LAB — SURGICAL PCR SCREEN: MRSA, PCR: NEGATIVE

## 2011-01-08 LAB — CBC
Platelets: 105 10*3/uL — ABNORMAL LOW (ref 150–400)
RBC: 4.46 MIL/uL (ref 4.22–5.81)
WBC: 6 10*3/uL (ref 4.0–10.5)

## 2011-01-08 NOTE — Letter (Signed)
Summary: p 2/[facility]  p 2/[facility]   Imported By: Lester Summerhill 10/02/2010 07:46:05  _____________________________________________________________________  External Attachment:    Type:   Image     Comment:   External Document

## 2011-01-08 NOTE — Progress Notes (Signed)
Summary: Question concerning Pradaxa  Phone Note Call from Patient   Caller: Daughter Reason for Call: Talk to Doctor Summary of Call: Returned call from pt's daughter concerning pradaxa.  Pt had cut his finger and had to go to the urgent care for 16 stitches, daughter states the practitioner had a difficult time stopping the bleeding.  The bleeding had stopped once the sutures were in place and the finger was wrapped.  I spoke to the daughter approx 30 min after leaving the urgent care and at this time there was no signs of bleeding and the finger was still wrapped.  She was calling to see if the patient should continue his pradaxa.  I recommended to continue the pradaxa as prescribed since the bleeding appears to be under control.  She is to call if this does not remain the case.  She truly appreciated the call back.   Initial call taken by: Robbi Garter NP-PA,  August 24, 2010 6:15 PM

## 2011-01-08 NOTE — Letter (Signed)
Summary: [facility]  [facility]   Imported By: Lester Piatt 10/02/2010 07:42:46  _____________________________________________________________________  External Attachment:    Type:   Image     Comment:   External Document

## 2011-01-08 NOTE — Letter (Signed)
Summary: [facility]  [facility]   Imported By: Marylou Mccoy 09/13/2010 09:09:41  _____________________________________________________________________  External Attachment:    Type:   Image     Comment:   External Document

## 2011-01-08 NOTE — Letter (Signed)
Summary: [facility]  [facility]   Imported By: Marylou Mccoy 08/27/2010 09:23:45  _____________________________________________________________________  External Attachment:    Type:   Image     Comment:   External Document

## 2011-01-08 NOTE — Assessment & Plan Note (Signed)
Summary: [name]   Visit Type:  Follow-up Primary Provider:  [name], MD  CC:  Atrial Fibrillation.  History of Present Illness: The patient presents for followup of the above. Since I last saw him he has had absolutely no palpitations. At every doctor's appointment he has been in regular rhythm. He was started on Pradaxa and tolerates this well. He says he is feeling well and exercising. He has no presyncope or syncope. He has no chest discomfort, neck or arm discomfort. He has no shortness of breath, PND or orthopnea. He has had no weight gain or edema. He has had some mild cough and phlegm production but no fevers or chills.  Current Medications (verified): 1)  Nexium 40 Mg Cpdr (Esomeprazole Magnesium) .Marland Kitchen.. 1 By Mouth Two Times A Day 2)  Multivitamins   Tabs (Multiple Vitamin) .Marland Kitchen.. 1 By Mouth Daily 3)  Aspirin 81 Mg  Tabs (Aspirin) .Marland Kitchen.. 1 By Mouth Daily 4)  Saw Palmetto Plus  Caps (Misc Natural Products) .Marland Kitchen.. 1 By Mouth Daily 5)  Pacerone 200 Mg Tabs (Amiodarone Hcl) .... Once A Day 6)  Flomax 0.4 Mg Caps (Tamsulosin Hcl) .Marland Kitchen.. 1 By Mouth Daily 7)  Vitamin B-12 500 Mcg  Tabs (Cyanocobalamin) .Marland Kitchen.. 1 By Mouth Daily 8)  Benicar 20 Mg Tabs (Olmesartan Medoxomil) .Marland Kitchen.. 1 By Mouth Daily 9)  Pradaxa 150 Mg Caps (Dabigatran Etexilate Mesylate) .... One Twice A Day  Allergies (verified): No Known Drug Allergies  Past History:  Past Medical History: Reviewed history from 08/23/2010 and no changes required. Multiple myeloma.  CAD.  Hypertension.  Dyslipidemia Atrial fibrillation Cholelithiasis Aortic Atherosclerosis GERD Hiatal Hernia Esophageal Stricture Anemia BPH  Past Surgical History: Reviewed history from 08/10/2010 and no changes required. Hip replacement surgery Back surgery Trigeminal neuralgia Appendectomy  Tonsillectomy.  Rght chest wall resection of second, third and fourth rib.   Review of Systems       As stated in the HPI and negative for all  other systems.   Vital Signs:  Patient profile:   77 year old male Height:      68 inches Weight:      190 pounds BMI:     28.99 Pulse rate:   79 / minute Resp:     16 per minute BP sitting:   158 / 82  (right arm)  Vitals Entered By: Marrion Coy, CNA (October 10, 2010 9:48 AM)  Physical Exam  General:  Well developed, well nourished elderly male, no acute distress. Head:  Normocephalic and atraumatic. Neck:  Supple; no masses or thyromegaly. Chest Wall:  Well-healed thoracotomy scar Lungs:  Clear throughout to auscultation. Abdomen:  Soft, nontender and nondistended. No masses, hepatosplenomegaly or hernias noted. Normal bowel sounds. Msk:  no deformities Extremities:  no edema Neurologic:  Alert and  oriented x4;  grossly normal neurologically. Skin:  Intact without significant lesions or rashes. Cervical Nodes:  no significant adenopathy Inguinal Nodes:  no significant adenopathy Psych:  Alert and cooperative. Normal mood and affect.   Detailed Cardiovascular Exam  Neck    Carotids: Transmitted systolic murmur    Neck Veins: Normal, no JVD.    Heart    Inspection: no deformities or lifts noted.      Palpation: normal PMI with no thrills palpable.      Auscultation: S1 and S2 within normal limits, no S3, no S4, 3/6 systolic murmur heard best at the right upper sternal border, no diastolic murmurs, no clicks, no rubs  Vascular  Abdominal Aorta: no palpable masses, pulsations, or audible bruits.      Femoral Pulses: normal femoral pulses bilaterally.      Pedal Pulses: Normal pulses noted.    Radial Pulses: normal radial pulses bilaterally.      Peripheral Circulation: no clubbing, cyanosis, or edema noted with normal capillary refill.     Impression & Recommendations:  Problem # 1:  ATRIAL FIBRILLATION (ICD-427.31) I suspect he has been maintaining sinus rhythm. Today I will give him amiodarone. He will continue anticoagulation. If in the months ot come,   there is absolutely no evidence of atrial fibrillation I will get rid of the Pradaxa  Problem # 2:  ESSENTIAL HYPERTENSION, BENIGN (ICD-401.1) His blood pressure is not controlled and I will HCT 12.5 mg to his regimen.  He was instructed to increase his intake of potassium containing foods.  Problem # 3:  MURMUR (ICD-785.2) I will followup with an echo in the months to come  Patient Instructions: 1)  Your physician recommends that you schedule a follow-up appointment in: 3 months with Dr Antoine Poche 2)  Your physician has recommended you make the following change in your medication: Stop Pacerone and Benicar.  Start Benicar 20/12.5 mg once a day 3)  You have been diagnosed with atrial fibrillation.  Atrial fibrillation is a condition in which one of the upper chambers of the heart has extra electrical cells causing it to beat very fast.  Please see the handout/brochure given to you today for further information. Prescriptions: BENICAR HCT 20-12.5 MG TABS (OLMESARTAN MEDOXOMIL-HCTZ) once a day  #30 x 11   Entered by:   Charolotte Capuchin, RN   Authorized by:   Rollene Rotunda, MD, [facility]   Signed by:   Charolotte Capuchin, RN on 10/10/2010   Method used:   Electronically to        [facility]* (retail)       [address] [place], [place]  [address]       Ph: [phone]       Fax: [phone]   RxID:   [id number]  I have reviewed and approved all prescriptions at the time of this visit. Rollene Rotunda, MD, [facility]  October 10, 2010 10:08 AM

## 2011-01-08 NOTE — Letter (Signed)
Summary: [facility]  [facility]   Imported By: Marylou Mccoy 08/24/2010 16:51:11  _____________________________________________________________________  External Attachment:    Type:   Image     Comment:   External Document

## 2011-01-08 NOTE — Procedures (Signed)
Summary: Preparation for Endoscopy / [facility]  Preparation for Endoscopy /  GI   Imported By: Lennie Odor 10/02/2010 10:06:45  _____________________________________________________________________  External Attachment:    Type:   Image     Comment:   External Document

## 2011-01-08 NOTE — Letter (Signed)
Summary: [facility]  [facility]   Imported By: Lennie Odor 11/02/2010 11:30:00  _____________________________________________________________________  External Attachment:    Type:   Image     Comment:   External Document

## 2011-01-08 NOTE — Progress Notes (Signed)
Summary: Triage / direct colonoscopy  Phone Note Call from Patient Call back at Home Phone [phone]   Caller: Daughter Malenda Call For: Dr. Marina Goodell Reason for Call: Talk to Nurse Summary of Call: On BT and wants to sch'd his COL directly. Saw his cardiologist this morning and was told to stop BT 3 days before procedure. Does not want to sch'd REV first. Can we sch'd directly? Initial call taken by: Karna Christmas,  October 10, 2010 10:25 AM  Follow-up for Phone Call        Dr Marina Goodell does this pt still need to have rov first?  The note form DrHochrein this morning does not mention stopping the Pradaxa.  please advise Follow-up by: Chales Abrahams CMA Duncan Dull),  October 10, 2010 10:37 AM  Additional Follow-up for Phone Call Additional follow up Details #1::        who recommended that he have a colonoscopy at this time? Dr. Berna Bue mentioned that the patient may possibly come off of his Pradaxa in a few months if no further atrial fibrillation. If this is for routine screening, and it has been greater than 10 years since his last exam, and he is having no acute lower GI problems, I would wait until he is on Pradaxa at which point he could see one of our pre-visit nurses and the schedule directly for colonoscopy (without an office visit first) Additional Follow-up by: Hilarie Fredrickson MD,  October 10, 2010 11:01 AM    Additional Follow-up for Phone Call Additional follow up Details #2::    left message on machine to call back Chales Abrahams CMA Duncan Dull)  October 10, 2010 11:10 AM   I spoke with the pt daughter and she states it has been 10 years since her father has had a colon (she is having those records faxed to [place])  He is having no GI problems and agrees to wait until pt is off of Pradaxa to call and schedule a direct procedure as long as no problems arise before then.   Follow-up by: Chales Abrahams CMA Duncan Dull),  October 10, 2010 1:36 PM

## 2011-01-08 NOTE — Letter (Signed)
Summary: Anticoagulation Modification Letter  [facility]  [address] [place], [place] [address]   Phone: [phone]  Fax: [phone]    August 28, 2010  Re:    Richard [name] DOB:    [birth year] MRN:    [id number]    Dear Rollene Rotunda, MD:  We have scheduled the above patient for an EGD with Dil procedure. Our records show that  he is on anticoagulation therapy. Please advise as to how long the patient may come off their therapy of Pradaxa prior to the scheduled procedure(s) on 10/01/10.   Please fax back/or route the completed form to Milford Cage, CMA at [phone].  Thank you for your help with this matter.  Sincerely,    Wilhemina Bonito. Marina Goodell, MD  Physician Recommendation:   Other:   Pradaxa ____________________________________  Appended Document: Anticoagulation Modification Letter OK to hold anticoagulation as needed for the planned procedure.  Appended Document: Anticoagulation Modification Letter Pt. called and notified.

## 2011-01-08 NOTE — Procedures (Signed)
Summary: Upper Endoscopy  Patient: Richard [name] Note: All result statuses are Final unless otherwise noted.  Tests: (1) Upper Endoscopy (EGD)   EGD Upper Endoscopy       DONE     [facility]     [address] [place], [place]  [address]           ENDOSCOPY PROCEDURE REPORT           PATIENT:  Richard [name], Richard [name]  MR#:  [id number]     BIRTHDATE:  1933/05/23, 77 yrs. old  GENDER:  male           ENDOSCOPIST:  Wilhemina Bonito. Eda Keys, MD     Referred by:  Suzanna Obey, M.D.           PROCEDURE DATE:  10/01/2010     PROCEDURE:  EGD with dilatation over guidewire - 18mm     FLUOROSCOPY USED TO ASSIST     DILATION     ASA CLASS:  Class III     INDICATIONS:  dysphagia, dilation of esophageal stricture,     abnormal imaging           MEDICATIONS:   Fentanyl 50 mcg IV, Versed 5 mg IV     TOPICAL ANESTHETIC:  Exactacain Spray           DESCRIPTION OF PROCEDURE:   After the risks benefits and     alternatives of the procedure were thoroughly explained, informed     consent was obtained.  The EG-[id number] ([id number]) endoscope was     introduced through the mouth and advanced to the second portion of     the duodenum, without limitations.  The instrument was slowly     withdrawn as the mucosa was fully examined.     <<PROCEDUREIMAGES>>           A ring-like 15mm stricture was found in the distal esophagus.  The     stomach was entered and closely examined. The antrum, angularis,     and lesser curvature were well visualized, including a retroflexed     view of the cardia and fundus. The stomach wall was normally     distensable. The scope passed easily through the pylorus into the     duodenum.  The duodenal bulb was normal in appearance, as was the     postbulbar duodenum.    Retroflexed views revealed a small hiatal     hernia.           THERAPY: SAVARY GUIDEWIRE PLACED INTO GASTRIC ANTRUM WITH     FLUOROSCOPIC CONTROL. DILATOR PASSED OVER WIRE. NO RESISTANCE     OR HEME.  TOLERATED WELL           COMPLICATIONS:  None           ENDOSCOPIC IMPRESSION:     1) Benign Stricture in the distal esophagus - s/p dilation 18mm           2) Normal stomach     3) Normal duodenum           RECOMMENDATIONS:     1) Clear liquids until 11am, then soft foods rest of day. Resume     prior diet tomorrow.     2) Continue Nexium     3) Resume Pradaxa today     4) Follow-up as needed           ______________________________     Jonny Ruiz  Lanelle Bal, MD           CC:  Suzanna Obey, MD, [name], MD, The Patient           n.     eSIGNED:   Wilhemina Bonito. Eda Keys at 10/01/2010 09:06 AM           Drucie Opitz, [id number]  Note: An exclamation mark (!) indicates a result that was not dispersed into the flowsheet. Document Creation Date: 10/01/2010 9:07 AM _______________________________________________________________________  (1) Order result status: Final Collection or observation date-time: 10/01/2010 08:57 Requested date-time:  Receipt date-time:  Reported date-time:  Referring Physician:   Ordering Physician: Fransico Setters [phone]) Specimen Source:  Source: Launa Grill Order Number: [id number] Lab site:

## 2011-01-08 NOTE — Letter (Signed)
Summary: EGD Instructions  [facility]  [address] [place], [place] [address]   Phone: [phone]  Fax: [phone]       Richard [name]    [birth year]    MRN: [id number]       Procedure Day /Date:MONDAY 10/01/10     Arrival Time: 7:30 AM     Procedure Time:8:30 AM     Location of Procedure:                     [facility] ( Outpatient Registration)   PREPARATION FOR ENDOSCOPY W/DIL   On MONDAY, 10/01/10 THE DAY OF THE PROCEDURE:  1.   No solid foods, milk or milk products are allowed after midnight the night before your procedure.  2.   Do not drink anything colored red or purple.  Avoid juices with pulp.  No orange juice.  3.  You may drink clear liquids until 4:30 AM, which is 4 hours before your procedure.                                                                                                CLEAR LIQUIDS INCLUDE: Water Jello Ice Popsicles Tea (sugar ok, no milk/cream) Powdered fruit flavored drinks Coffee (sugar ok, no milk/cream) Gatorade Juice: apple, white grape, white cranberry  Lemonade Clear bullion, consomm, broth Carbonated beverages (any kind) Strained chicken noodle soup Hard Candy   MEDICATION INSTRUCTIONS  Unless otherwise instructed, you should take regular prescription medications with a small sip of water as early as possible the morning of your procedure.  YOU WILL NEED TO HOLD YOU PRADAXA.  OUR OFFICE WILL CONTACT YOU ONCE WE HEAR FROM DR. HOCHREIN.  IF YOU HAVE NOT HEAR FROM OUR OFFICE WITHIN 2 WEEKS PRIOR TO YOUR PROCEDURE, PLEASE  CALL OUR OFFICE.               OTHER INSTRUCTIONS  You will need a responsible adult at least 75 years of age to accompany you and drive you home.   This person must remain in the waiting room during your procedure.  Wear loose fitting clothing that is easily removed.  Leave jewelry and other valuables at home.  However, you may wish to bring a book to read or an  iPod/MP3 player to listen to music as you wait for your procedure to start.  Remove all body piercing jewelry and leave at home.  Total time from sign-in until discharge is approximately 2-3 hours.  You should go home directly after your procedure and rest.  You can resume normal activities the day after your procedure.  The day of your procedure you should not:   Drive   Make legal decisions   Operate machinery   Drink alcohol   Return to work  You will receive specific instructions about eating, activities and medications before you leave.    The above instructions have been reviewed and explained to me by   _______________________    I fully understand and can verbalize these instructions _____________________________ Date _________

## 2011-01-08 NOTE — Progress Notes (Signed)
  Phone Note Outgoing Call   Call placed by: Milford Cage NCMA,  September 17, 2010 1:51 PM Call placed to: Patient Summary of Call: Returned patient's daughter [name] phone call regarding Pre-0p.  I left msg. stating that he does not need pre-op for this procedure.   Initial call taken by: Milford Cage NCMA,  September 17, 2010 1:55 PM

## 2011-01-08 NOTE — Letter (Signed)
Summary: New Patient letter  [facility]  [address] [place], [place] [address]   Phone: [phone]  Fax: [phone]       07/04/2008 MRN: [id number]  Richard [name] [address] [place], [place]  [address]  Dear Richard [name],  Welcome to the Gastroenterology Division at [facility].    You are scheduled to see Dr.  Marina Goodell on 08-22-10 at 10:00a.m. on the 3rd floor at [facility], [address]  We ask that you try to arrive at our office 15 minutes prior to your appointment time to allow for check-in.  We would like you to complete the enclosed self-administered evaluation form prior to your visit and bring it with you on the day of your appointment.  We will review it with you.  Also, please bring a complete list of all your medications or, if you prefer, bring the medication bottles and we will list them.  Please bring your insurance card so that we may make a copy of it.  If your insurance requires a referral to see a specialist, please bring your referral form from your primary care physician.  Co-payments are due at the time of your visit and may be paid by cash, check or credit card.     Your office visit will consist of a consult with your physician (includes a physical exam), any laboratory testing he/she may order, scheduling of any necessary diagnostic testing (e.g. x-ray, ultrasound, CT-scan), and scheduling of a procedure (e.g. Endoscopy, Colonoscopy) if required.  Please allow enough time on your schedule to allow for any/all of these possibilities.    If you cannot keep your appointment, please call [phone] to cancel or reschedule prior to your appointment date.  This allows [place] the opportunity to schedule an appointment for another patient in need of care.  If you do not cancel or reschedule by 5 p.m. the business day prior to your appointment date, you will be charged a $50.00 late cancellation/no-show fee.    Thank you for choosing  [facility] for your medical needs.  We appreciate the opportunity to care for you.  Please visit [place] at our website  to learn more about our practice.                     Sincerely,                                                             The Gastroenterology Division

## 2011-01-08 NOTE — Letter (Signed)
[facility]   Imported By: Roderic Ovens 08/14/2010 15:25:38  _____________________________________________________________________  External Attachment:    Type:   Image     Comment:   External Document

## 2011-01-08 NOTE — Miscellaneous (Signed)
  Clinical Lists Changes  Observations: Added new observation of ECHOINTERP:   - Left ventricle: The cavity size was normal. Systolic function was     normal. The estimated ejection fraction was 50%. Regional wall     motion abnormalities cannot be excluded.   - Pulmonary arteries: PA peak pressure: 51mm Hg (S).   Impressions:    - The right ventricular systolic pressure was increased consistent     with moderate pulmonary hypertension (07/30/2010 10:27)      Echocardiogram  Procedure date:  07/30/2010  Findings:        - Left ventricle: The cavity size was normal. Systolic function was     normal. The estimated ejection fraction was 50%. Regional wall     motion abnormalities cannot be excluded.   - Pulmonary arteries: PA peak pressure: 51mm Hg (S).   Impressions:    - The right ventricular systolic pressure was increased consistent     with moderate pulmonary hypertension

## 2011-01-08 NOTE — Assessment & Plan Note (Signed)
Summary: DYSPHAGIA / STRICTURE - new patient   History of Present Illness Visit Type: new patient Primary GI MD: Yancey Flemings MD Primary Provider: [name], MD Requesting Provider: Suzanna Obey M.D. Chief Complaint: Solid food and liquid dysphagia with some acid reflux.  History of Present Illness:   77 year old white male with multiple myeloma, coronary artery disease, hypertension, dyslipidemia, recent pneumonia with respiratory insufficiency, recent atrial fibrillation for which he is on Pradaxa. He presents today regarding dysphagia. He is accompanied by his daughter. The patient reports a three-year history of intermittent solid food dysphagia. A problem currently occurs about once per week. He was evaluated by Dr. Jearld Fenton regarding hoarseness. 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 He was placed on PPI therapy. Biopsies of the nodules was anticipated but the patient developed pneumonia and was hospitalized for 8 days in mid-August 2011. Because of complaints of dysphagia, he did undergo a barium esophagram on June 04, 2010. This revealed a smooth distal esophageal stricture. 13 mm barium tablet lodged. He does have a background of intermittent pyrosis and regurgitation. Better on Nexium. No lower GI complaints. His daughter states that he underwent colonoscopy within the past 10 years and this was said to be negative and without polyps. Also, colonoscopy prior to that, also negative.. Not certain with whom or where . No lower GI complaints.   GI Review of Systems    Reports acid reflux, dysphagia with liquids, and  dysphagia with solids.      Denies abdominal pain, belching, bloating, chest pain, heartburn, loss of appetite, nausea, vomiting, vomiting blood, weight loss, and  weight gain.        Denies anal fissure, black tarry stools, change in bowel habit, constipation, diarrhea, diverticulosis, fecal incontinence, heme positive stool,  hemorrhoids, irritable bowel syndrome, jaundice, light color stool, liver problems, rectal bleeding, and  rectal pain.    Current Medications (verified): 1)  Nexium 40 Mg Cpdr (Esomeprazole Magnesium) .Marland Kitchen.. 1 By Mouth Two Times A Day 2)  Multivitamins   Tabs (Multiple Vitamin) .Marland Kitchen.. 1 By Mouth Daily 3)  Furosemide 20 Mg Tabs (Furosemide) .Marland Kitchen.. 1 By Mouth Daily 4)  Klor-Con 10 10 Meq Cr-Tabs (Potassium Chloride) .Marland Kitchen.. 1 By Mouth Daily 5)  Aspirin 81 Mg  Tabs (Aspirin) .Marland Kitchen.. 1 By Mouth Daily 6)  Saw Palmetto Plus  Caps (Misc Natural Products) .Marland Kitchen.. 1 By Mouth Daily 7)  Pacerone 200 Mg Tabs (Amiodarone Hcl) .... Once A Day 8)  Flomax 0.4 Mg Caps (Tamsulosin Hcl) .Marland Kitchen.. 1 By Mouth Daily 9)  Vitamin B-12 500 Mcg  Tabs (Cyanocobalamin) .Marland Kitchen.. 1 By Mouth Daily 10)  Benicar 20 Mg Tabs (Olmesartan Medoxomil) .Marland Kitchen.. 1 By Mouth Daily 11)  Pradaxa 150 Mg Caps (Dabigatran Etexilate Mesylate) .... One Twice A Day 12)  Doxycycline Hyclate 100 Mg Tabs (Doxycycline Hyclate) .... One Capsule By Mouth Two Times A Day  Allergies (verified): No Known Drug Allergies  Past History:  Past Medical History: Reviewed history from 08/23/2010 and no changes required. Multiple myeloma.  CAD.  Hypertension.  Dyslipidemia Atrial fibrillation Cholelithiasis Aortic Atherosclerosis GERD Hiatal Hernia Esophageal Stricture Anemia BPH  Past Surgical History: Reviewed history from 08/10/2010 and no changes required. Hip replacement surgery Back surgery Trigeminal neuralgia Appendectomy  Tonsillectomy.  Rght chest wall resection of second, third and fourth rib.   Family History: Reviewed history from 08/10/2010 and no changes required. His father died of MI infarctions age 36.  Social History: Reviewed  history from 08/10/2010 and no changes required.  He is single, has 2 children, is retired.  Quit smoking   in 1995.  Does not drink alcohol on a regular basis.      Review of Systems  The patient denies  allergy/sinus, anemia, anxiety-new, arthritis/joint pain, back pain, blood in urine, breast changes/lumps, change in vision, confusion, cough, coughing up blood, depression-new, fainting, fatigue, fever, headaches-new, hearing problems, heart murmur, heart rhythm changes, itching, menstrual pain, muscle pains/cramps, night sweats, nosebleeds, pregnancy symptoms, shortness of breath, skin rash, sleeping problems, sore throat, swelling of feet/legs, swollen lymph glands, thirst - excessive , urination - excessive , urination changes/pain, urine leakage, vision changes, and voice change.    Vital Signs:  Patient profile:   77 year old male Height:      68 inches Weight:      181.13 pounds BMI:     27.64 Pulse rate:   80 / minute Pulse rhythm:   regular BP sitting:   158 / 80  (right arm) Cuff size:   regular  Vitals Entered By: Christie Nottingham CMA Duncan Dull) (August 28, 2010 9:46 AM)  Physical Exam  General:  Well developed, well nourished elderly male, no acute distress. Head:  Normocephalic and atraumatic. Eyes:  PERRLA, no icterus. Mouth:  No deformity or lesions. No thrush Neck:  Supple; no masses or thyromegaly. Lungs:  Clear throughout to auscultation. Heart:  Regular rate and rhythm; no murmurs, rubs,  or bruits. Abdomen:  Soft, nontender and nondistended. No masses, hepatosplenomegaly or hernias noted. Normal bowel sounds. Msk:  no deformities Pulses:  Normal pulses noted. Extremities:  no edema Neurologic:  Alert and  oriented x4;  grossly normal neurologically. Skin:  Intact without significant lesions or rashes. Psych:  Alert and cooperative. Normal mood and affect.   Impression & Recommendations:  Problem # 1:  DYSPHAGIA UNSPECIFIED (ICD-86.64) 77 year old with multiple significant medical problems who presents today regarding a three-year history of intermittent solid food dysphagia. This on a background of GERD (530.81). Abnormal barium radiology (793.4) demonstrating  what appears to be a benign smooth stricture (530.3) of the distal esophagus, likely peptic. Would benefit from esophageal dilation. The patient is HIGH RISK given his comorbidities and the need to address his anticoagulation therapy.  Plan: #1. Upper endoscopy with esophageal dilation. The nature of the procedure as well as the risks, benefits, and alternatives have been reviewed. He understood and agreed to proceed. This will be performed at the hospital given his comorbidities. #2. Hold Pradaxa preprocedure. We will formally addressed this with his cardiologist Dr. Rollene Rotunda. A standard form letter has been sent to his office in this regard.  Problem # 2:  ATRIAL FIBRILLATION (ICD-427.31) on Pradaxa. See above discussion. Currently in sinus rhythm  Other Orders: EGD SAV (EGD SAV)  Patient Instructions: 1)  EGD with [facility] 10/01/10 8:30 am arrive at 7:30 am 2)  Letter will be sent to Dr. Antoine Poche for directions on how long to hold your Pradaxa.  We will call you once that is done. 3)  Copy sent to : [name], MD, Rollene Rotunda, MD, Dr. Suzanna Obey 4)  The medication list was reviewed and reconciled.  All changed / newly prescribed medications were explained.  A complete medication list was provided to the patient / caregiver.

## 2011-01-08 NOTE — Assessment & Plan Note (Signed)
Summary: np6/ new afib, pt has [employer]. gd   Visit Type:  Initial Consult Primary Richard [name]:  Richard Gu, MD  CC:  Atrial fibrillation and flutter.  History of Present Illness: The patient presents for evaluation of the above.  He has no prior cardiac history until recently. He has had problems related to the diagnosis of multiple myeloma. He's been treated for this successfully but has had apparent neutropenia intermittently. He was hospitalized on 818 with pneumonia and pleural effusions. He had acute respiratory failure. During this hospitalization he did have atrial fibrillation and flutter. He was treated with amiodarone and had paroxysms apparently. It was suggested that he start anticoagulation but the daughter was very hesitant about this.   He now presents as a new patient. Prior to this he had not had any cardiac issues. He never noted any arrhythmias. He had a treadmill test years ago. Since going home from the hospital he has been weak. However, he has had no palpitations, presyncope or syncope. He has had a chest discomfort, neck or arm discomfort. He has had no shortness of breath, PND or orthopnea. He has been maintained on amiodarone. Of note during his hospitalization he did have an echocardiogram.  His ejection fraction was found to be 50%. There was some mild-to-moderate pulmonary hypertension noted.   Allergies (verified): No Known Drug Allergies  Past History:  Past Medical History: Multiple myeloma.  CAD.  Hypertension.  Dyslipidemia Atrial fibrillation  Past Surgical History: Hip replacement surgery Back surgery Trigeminal neuralgia Appendectomy  Tonsillectomy.  Rght chest wall resection of second, third and fourth rib.   Family History: His father died of MI infarctions age 17.  Social History:  He is single, has 2 children, is retired.  Quit smoking   in 1995.  Does not drink alcohol on a regular basis.      Review of Systems       Positive  for diarrhea, reflux, decreased urinary stream, joint pain, edema  Vital Signs:  Patient profile:   77 year old male Height:      68 inches Weight:      181 pounds BMI:     27.62 Pulse rate:   73 / minute Resp:     16 per minute BP sitting:   150 / 84  (right arm)  Vitals Entered By: Marrion Coy, CNA (August 10, 2010 11:42 AM)  Physical Exam  General:  Well developed, well nourished, in no acute distress. Head:  normocephalic and atraumatic Eyes:  PERRLA/EOM intact; conjunctiva and lids normal. Mouth:  Teeth, gums and palate normal. Oral mucosa normal. Neck:  Neck supple, no JVD. No masses, thyromegaly or abnormal cervical nodes. Chest Wall:  Well-healed thoracotomy scar Lungs:  Mildly decreased breath sounds right greater than left Abdomen:  Bowel sounds positive; abdomen soft and non-tender without masses, organomegaly, or hernias noted. No hepatosplenomegaly. Msk:  Back normal, normal gait. Muscle strength and tone normal. Pulses:  pulses normal in all 4 extremities Extremities:  No clubbing or cyanosis, mild bilateral ankle edema Neurologic:  Alert and oriented x 3. Skin:  Ecchymosis Cervical Nodes:  no significant adenopathy Inguinal Nodes:  no significant adenopathy Psych:  Normal affect.   Detailed Cardiovascular Exam  Neck    Carotids: Transmitted systolic murmur    Neck Veins: Normal, no JVD.    Heart    Inspection: no deformities or lifts noted.      Palpation: normal PMI with no thrills palpable.  Auscultation: S1 and S2 within normal limits, no S3, no S4, 3/6 systolic murmur heard best at the right upper sternal border, no diastolic murmurs, no clicks, no rubs  Vascular    Abdominal Aorta: no palpable masses, pulsations, or audible bruits.      Femoral Pulses: normal femoral pulses bilaterally.      Pedal Pulses: normal pedal pulses bilaterally.      Radial Pulses: normal radial pulses bilaterally.      Peripheral Circulation: no clubbing,  cyanosis, or edema noted with normal capillary refill.     EKG  Procedure date:  08/10/2010  Findings:      Sinus rhythm, rate 73, axis within normal limits, RSR prime V1, QT prolonged, no acute ST-T wave changes  Impression & Recommendations:  Problem # 1:  ATRIAL FIBRILLATION (ICD-427.31) I do believe the patient should be on Coumadin given his recent paroxysm. Per DACs who would be a reasonable choice. He has no contraindications to this. His daughter would be okay with per DACs. She will look in to see whether it is cause prohibitive. They did not want to take Coumadin. Today I will decrease his amiodarone to 200 mg daily. Eventually I would like to get rid of the amiodarone as this arrhythmia could have been related to the acute illness. At that point I would like to have him on systemic anticoagulation in case he pops back into fibrillation. If he has any palpitations going forward I will place a monitor to see if there is any evidence of ongoing paroxysmal fibrillation. Orders: EKG w/ Interpretation (93000)  Problem # 2:  MURMUR (ICD-785.2) His murmur sounds like that of aortic sclerosis though this was not described on the echo which I reviewed. At this point I'll follow this up with a repeat echo in the future as described below.  Problem # 3:  HYPERTENSION, PULMONARY (ICD-416.8) There was some evidence of pulmonary hypertension on the recent echo. I will follow this up with an echocardiogram in a few months. This could have been related to his acute illness.  Patient Instructions: 1)  Your physician recommends that you schedule a follow-up appointment in 2 months 2)  Your physician has recommended you make the following change in your medication: Decrease Pacerone to 200 mg once a day

## 2011-01-08 NOTE — Progress Notes (Signed)
Summary: pt needs pradaxa called in     done  Phone Note Call from Patient Call back at [phone]   Caller: Daughter/Melinda Rob Bunting Reason for Call: Talk to Nurse, Talk to Doctor Summary of Call: pt needs pradaxa called in to insurance for prior autherization, pls contact medicare complete/aarp/secure horizon's @ [phone] 150mg  two times a day's he uses [facility] [phone]. She also needs a discount card she was told about one at the visit but didn't get it prior to leaving. Initial call taken by: Omer Jack,  August 10, 2010 3:11 PM  Follow-up for Phone Call        pt's dtr picked up discount card and activated it-now she needs to make sure the rx is called in so she can pick up today Glynda Jaeger  August 10, 2010 4:58 PM   Additional Follow-up for Phone Call Additional follow up Details #1::        prior auth obtained - rx to be sent to [facility] ar requested.  Daughter aware Additional Follow-up by: Charolotte Capuchin, RN,  August 10, 2010 6:26 PM    New/Updated Medications: PRADAXA 150 MG CAPS (DABIGATRAN ETEXILATE MESYLATE) one twice a day Prescriptions: PRADAXA 150 MG CAPS (DABIGATRAN ETEXILATE MESYLATE) one twice a day  #60 x 6   Entered by:   Charolotte Capuchin, RN   Authorized by:   Rollene Rotunda, MD, [facility]   Signed by:   Charolotte Capuchin, RN on 08/10/2010   Method used:   Electronically to        [facility]* (retail)       [address] [place], [place]  [address]       Ph: [phone]       Fax: [phone]   RxID:   [id number]

## 2011-01-10 ENCOUNTER — Ambulatory Visit (HOSPITAL_COMMUNITY)
Admission: RE | Admit: 2011-01-10 | Discharge: 2011-01-10 | Disposition: A | Discharge status: Home / Self Care | Payer: MEDICARE | Attending: Otolaryngology | Admitting: Otolaryngology

## 2011-01-10 ENCOUNTER — Other Ambulatory Visit: Payer: Self-pay | Admitting: Otolaryngology

## 2011-01-10 DIAGNOSIS — C320 Malignant neoplasm of glottis: Secondary | ICD-10-CM

## 2011-01-10 DIAGNOSIS — J383 Other diseases of vocal cords: Secondary | ICD-10-CM | POA: Insufficient documentation

## 2011-01-10 HISTORY — DX: Malignant neoplasm of glottis: C32.0

## 2011-01-10 NOTE — Letter (Signed)
Summary:  Cancer Center  [facility]   Imported By: Sherian Rein 12/21/2010 12:30:04  _____________________________________________________________________  External Attachment:    Type:   Image     Comment:   External Document

## 2011-01-10 NOTE — Progress Notes (Signed)
Summary: question re surgery and meds  Phone Note From Other Clinic Call back at [phone]   Caller: Nurse/glenda dr. Suzanna Obey office Summary of Call: pt needs clearnace for surgery. they have question re PRADAXA 150 MG and ASPIRIN 81 MG glenda wants to know how long can pt be off meds for surgery. Initial call taken by: Roe Coombs,  December 19, 2010 4:37 PM  Follow-up for Phone Call        per [facility] pt will be having a micro laryngoscopy with biopsy of r. vocal cord due to a lesion. will be under general anesthesia and a 45 min procedure at the main hospital.  need surgical clearance and approval to hold pradaxa and ASA. please advise.  Follow-up by: Claris Gladden RN,  December 19, 2010 4:55 PM  Additional Follow-up for Phone Call Additional follow up Details #1::        OK for the planned surgery.  He can hold the ASA for 5 days prior.  Even thought we don't need to hold the Pradaxa for that long, for convenience he can also hold this for five days.  He should restart both when the MD thinks the risk of bleeding is low. 12/20/10--faxed surgical clearance letter to glenda at dr byer's office--they will call pt with instructions on pradaxa and ASA--NT Additional Follow-up by: Rollene Rotunda, MD, [facility],  December 19, 2010 5:07 PM

## 2011-01-14 ENCOUNTER — Encounter: Payer: Self-pay | Admitting: Cardiology

## 2011-01-14 ENCOUNTER — Ambulatory Visit (INDEPENDENT_AMBULATORY_CARE_PROVIDER_SITE_OTHER): Payer: MEDICARE | Admitting: Cardiology

## 2011-01-14 DIAGNOSIS — I4891 Unspecified atrial fibrillation: Secondary | ICD-10-CM

## 2011-01-15 ENCOUNTER — Ambulatory Visit: Payer: MEDICARE | Attending: Radiation Oncology | Admitting: Radiation Oncology

## 2011-01-15 DIAGNOSIS — Z87891 Personal history of nicotine dependence: Secondary | ICD-10-CM | POA: Insufficient documentation

## 2011-01-15 DIAGNOSIS — D020 Carcinoma in situ of larynx: Secondary | ICD-10-CM | POA: Insufficient documentation

## 2011-01-15 DIAGNOSIS — I4892 Unspecified atrial flutter: Secondary | ICD-10-CM | POA: Insufficient documentation

## 2011-01-15 DIAGNOSIS — I4891 Unspecified atrial fibrillation: Secondary | ICD-10-CM | POA: Insufficient documentation

## 2011-01-15 DIAGNOSIS — Z79899 Other long term (current) drug therapy: Secondary | ICD-10-CM | POA: Insufficient documentation

## 2011-01-15 DIAGNOSIS — Z510 Encounter for antineoplastic radiation therapy: Secondary | ICD-10-CM | POA: Insufficient documentation

## 2011-01-15 DIAGNOSIS — Z807 Family history of other malignant neoplasms of lymphoid, hematopoietic and related tissues: Secondary | ICD-10-CM | POA: Insufficient documentation

## 2011-01-15 DIAGNOSIS — C9001 Multiple myeloma in remission: Secondary | ICD-10-CM | POA: Insufficient documentation

## 2011-01-15 DIAGNOSIS — K219 Gastro-esophageal reflux disease without esophagitis: Secondary | ICD-10-CM | POA: Insufficient documentation

## 2011-01-15 DIAGNOSIS — Y842 Radiological procedure and radiotherapy as the cause of abnormal reaction of the patient, or of later complication, without mention of misadventure at the time of the procedure: Secondary | ICD-10-CM | POA: Insufficient documentation

## 2011-01-15 DIAGNOSIS — I10 Essential (primary) hypertension: Secondary | ICD-10-CM | POA: Insufficient documentation

## 2011-01-15 DIAGNOSIS — T66XXXA Radiation sickness, unspecified, initial encounter: Secondary | ICD-10-CM | POA: Insufficient documentation

## 2011-01-15 DIAGNOSIS — R1310 Dysphagia, unspecified: Secondary | ICD-10-CM | POA: Insufficient documentation

## 2011-01-15 DIAGNOSIS — R490 Dysphonia: Secondary | ICD-10-CM | POA: Insufficient documentation

## 2011-01-17 ENCOUNTER — Telehealth: Payer: Self-pay | Admitting: Cardiology

## 2011-01-17 ENCOUNTER — Encounter (HOSPITAL_BASED_OUTPATIENT_CLINIC_OR_DEPARTMENT_OTHER): Payer: MEDICARE | Admitting: Oncology

## 2011-01-17 ENCOUNTER — Other Ambulatory Visit: Payer: Self-pay | Admitting: Oncology

## 2011-01-17 ENCOUNTER — Encounter: Payer: Self-pay | Admitting: Cardiology

## 2011-01-17 DIAGNOSIS — D801 Nonfamilial hypogammaglobulinemia: Secondary | ICD-10-CM

## 2011-01-17 DIAGNOSIS — M549 Dorsalgia, unspecified: Secondary | ICD-10-CM

## 2011-01-17 DIAGNOSIS — C9000 Multiple myeloma not having achieved remission: Secondary | ICD-10-CM

## 2011-01-17 DIAGNOSIS — D6959 Other secondary thrombocytopenia: Secondary | ICD-10-CM

## 2011-01-17 LAB — CBC WITH DIFFERENTIAL/PLATELET
BASO%: 0.2 % (ref 0.0–2.0)
Basophils Absolute: 0 10*3/uL (ref 0.0–0.1)
HCT: 40.5 % (ref 38.4–49.9)
HGB: 14.2 g/dL (ref 13.0–17.1)
MCHC: 35.1 g/dL (ref 32.0–36.0)
MONO#: 0.5 10*3/uL (ref 0.1–0.9)
NEUT%: 59.5 % (ref 39.0–75.0)
RDW: 13.9 % (ref 11.0–14.6)
WBC: 5.3 10*3/uL (ref 4.0–10.3)
lymph#: 1.5 10*3/uL (ref 0.9–3.3)

## 2011-01-18 LAB — COMPREHENSIVE METABOLIC PANEL
AST: 21 U/L (ref 0–37)
Alkaline Phosphatase: 45 U/L (ref 39–117)
BUN: 14 mg/dL (ref 6–23)
CO2: 27 mEq/L (ref 19–32)
Chloride: 98 mEq/L (ref 96–112)
Glucose, Bld: 89 mg/dL (ref 70–99)
Potassium: 4 mEq/L (ref 3.5–5.3)
Total Bilirubin: 0.9 mg/dL (ref 0.3–1.2)

## 2011-01-18 LAB — KAPPA/LAMBDA LIGHT CHAINS
Kappa:Lambda Ratio: 0.52 (ref 0.26–1.65)
Lambda Free Lght Chn: 1.76 mg/dL (ref 0.57–2.63)

## 2011-01-23 DIAGNOSIS — C61 Malignant neoplasm of prostate: Secondary | ICD-10-CM

## 2011-01-23 HISTORY — DX: Malignant neoplasm of prostate: C61

## 2011-01-24 NOTE — Progress Notes (Signed)
Summary: Need to hold Pradaxa and ASA for biopsy  Phone Note From Other Clinic   Caller: Nurse Call For: pt Details for Reason: need to hold Pradaxa for biopsy Summary of Call: Biopsy of prostate - MD wants to hold Pradaxa and ASA for 5 days prior to biopsy and needs OK for this.  Dr Larey Dresser, fax number [phone].  Will discuss with Dr Antoine Poche Initial call taken by: Charolotte Capuchin, RN,  January 17, 2011 10:41 AM  Follow-up for Phone Call        OK to hold Pradaxa and ECASA as needed for the planned biopsy.  We suggest holding Pradaxa for 5 doses. Follow-up by: Rollene Rotunda, MD, [facility],  January 17, 2011 11:49 AM  Additional Follow-up for Phone Call Additional follow up Details #1::        information faxed as requested Additional Follow-up by: Charolotte Capuchin, RN,  January 17, 2011 12:25 PM

## 2011-01-24 NOTE — Op Note (Signed)
  NAMEPruitt, Richard [name]                ACCOUNT NO.:  [id number]  MEDICAL RECORD NO.:  [id number]           PATIENT TYPE:  O  LOCATION:  [facility]                         FACILITY:  [facility]  PHYSICIAN:  Suzanna Obey, M.D.       DATE OF BIRTH:  15-Feb-1933  DATE OF PROCEDURE:  01/10/2011 DATE OF DISCHARGE:  01/10/2011                              OPERATIVE REPORT   PREOPERATIVE DIAGNOSIS:  Right true vocal cord lesion.  POSTOPERATIVE DIAGNOSIS:  Right true vocal cord lesion.  SURGICAL PROCEDURE:  Microlaryngoscopy with biopsy of right vocal cord.  ANESTHESIA:  General.  ESTIMATED BLOOD LOSS:  Less than 5 mL.  INDICATIONS:  This is a 77 year old with a lesion on this right vocal cord that was diagnosed many months ago.  The patient was cancelled the original surgery for cardiac workup and has been through all of that, now has clearance.  He has maintained his hoarseness.  No new symptoms. He was informed of risks and benefits of the procedure and options were discussed.  All questions were answered and consent was obtained.  OPERATION:  The patient was taken to the operating room and placed in supine position.  After general endotracheal tube anesthesia, was placed in this Rose position.  The Dedo scope was inserted and the larynx and hypopharynx were examined, and there was no lesions in the hypopharynx or oral cavity.  The larynx was then identified, and the scope was suspended.  Using the microscope, the right vocal cord had an obvious exophytic lesion that extended from about mid cord up into the area of the anterior commissure.  It was completely replacing that portion of the cord.  It did not appear to extend the best I could tell significantly into the ventricle.  The left cord looked normal as well as the arytenoids and postcricoid area.  The biopsies were taken from this exophytic right lesion and sent for pathology.  Adrenaline-soaked pledget was placed to gain good hemostasis.   There was no further blood or blood clots.  The patient was then removed of the pledgets and scope, and was awakened and brought to recovery in stable condition.  Counts correct.          ______________________________ Suzanna Obey, M.D.     JB/MEDQ  D:  01/10/2011  T:  01/11/2011  Job:  [id number]  Electronically Signed by Suzanna Obey M.D. on 01/24/2011 10:02:24 AM

## 2011-01-24 NOTE — Assessment & Plan Note (Signed)
Summary: 427.31, 401.1, 3 month rov. pfh.rn/sp   Visit Type:  Follow-up Primary Provider:  [name], MD  CC:  Atrial Fibrillation.  History of Present Illness: The patient presents for followup of the above. Since I last saw him he has had a biopsy of his vocal cords. I actually reviewed these results with him and discussed it with Dr. Donell Beers.  This is showing squamous cell cancer. The plan is for radiation. The patient also has an elevated PSA. Finally he is being followed for management of his multiple myeloma which has been in remission. He has been off of his amiodarone but has had no symptomatic tachypalpitations. He has had no presyncope or syncope. He denies any new shortness of breath, PND or orthopnea. He has had no weight gain or edema. We have treated him with Predaxa and he tolerates this.  He did recently have diarrhea that he thought was related to Nexium. He's had some minimal specks of blood when he uses wipes after using the bathroom.  Current Medications (verified): 1)  Nexium 40 Mg Cpdr (Esomeprazole Magnesium) .Marland Kitchen.. 1 By Mouth Two Times A Day 2)  Multivitamins   Tabs (Multiple Vitamin) .Marland Kitchen.. 1 By Mouth Daily 3)  Aspirin 81 Mg  Tabs (Aspirin) .Marland Kitchen.. 1 By Mouth Daily 4)  Saw Palmetto Plus  Caps (Misc Natural Products) .Marland Kitchen.. 1 By Mouth Daily 5)  Flomax 0.4 Mg Caps (Tamsulosin Hcl) .Marland Kitchen.. 1 By Mouth Daily 6)  Vitamin B-12 500 Mcg  Tabs (Cyanocobalamin) .Marland Kitchen.. 1 By Mouth Daily 7)  Pradaxa 150 Mg Caps (Dabigatran Etexilate Mesylate) .... One Twice A Day 8)  Benicar Hct 20-12.5 Mg Tabs (Olmesartan Medoxomil-Hctz) .... Once A Day  Allergies (verified): No Known Drug Allergies  Past History:  Past Medical History: Last updated: 08/23/2010 Multiple myeloma.  CAD.  Hypertension.  Dyslipidemia Atrial fibrillation Cholelithiasis Aortic Atherosclerosis GERD Hiatal Hernia Esophageal Stricture Anemia BPH  Review of Systems       As stated in the HPI and negative for all  other systems  Vital Signs:  Patient profile:   77 year old male Height:      68 inches Weight:      195 pounds BMI:     29.76 Pulse rate:   75 / minute Resp:     18 per minute BP sitting:   138 / 76  (right arm)  Vitals Entered By: Marrion Coy, CNA (January 14, 2011 10:36 AM)  Physical Exam  General:  Well developed, well nourished elderly male, no acute distress. Head:  Normocephalic and atraumatic. Eyes:  PERRLA, no icterus. Neck:  Supple; no masses or thyromegaly. Chest Wall:  Well-healed thoracotomy scar Lungs:  Clear throughout to auscultation. Abdomen:  Soft, nontender and nondistended. No masses, hepatosplenomegaly or hernias noted. Normal bowel sounds. Msk:  no deformities Extremities:  no edema Neurologic:  Alert and  oriented x4;  grossly normal neurologically. Skin:  Intact without significant lesions or rashes. Cervical Nodes:  no significant adenopathy Inguinal Nodes:  no significant adenopathy Psych:  Alert and cooperative. Normal mood and affect.   Detailed Cardiovascular Exam  Neck    Carotids: Transmitted systolic murmur    Neck Veins: Normal, no JVD.    Heart    Inspection: no deformities or lifts noted.      Palpation: normal PMI with no thrills palpable.      Auscultation: S1 and S2 within normal limits, no S3, no S4, 3/6 systolic murmur heard best at the right upper  sternal border, no diastolic murmurs, no clicks, no rubs  Vascular    Abdominal Aorta: no palpable masses, pulsations, or audible bruits.      Femoral Pulses: normal femoral pulses bilaterally.      Pedal Pulses: Normal pulses noted.    Radial Pulses: normal radial pulses bilaterally.      Peripheral Circulation: no clubbing, cyanosis, or edema noted with normal capillary refill.     EKG  Procedure date:  01/14/2011  Findings:      Sinus rhythm, rate 75, axis left, intervals within normal limits, no acute ST-T wave changes.  Impression & Recommendations:  Problem # 1:   ATRIAL FIBRILLATION (ICD-427.31) The patient has had no symptomatic. He has been off of amiodarone. At this point I reviewed all of his past in recent records in the hospital in the office extensively. He does have some risk factors for thromboembolic event such as his hypertension and mildly reduced ejection fraction. He has a new diagnosis of malignancy. He is having absolutely no problems with Pradaxa except for some very small spotting on the toilet paper when he wipes.  At this point, after lengthy discussion,  I think that it is most prudent to continue the Pradaxa until I see hm again in a few months.  Again, we discussed at length the risks and benefits. Orders: EKG w/ Interpretation (93000)  Problem # 2:  ESSENTIAL HYPERTENSION, BENIGN (ICD-401.1) His blood pressure is well controlled.  He will continue his same meds.    Problem # 3:  MURMUR (ICD-785.2) I reviewed the echo done most recently and there was no significant valve pathology.  No further work up is indicated.  Patient Instructions: 1)  Your physician recommends that you schedule a follow-up appointment in: 3 months 2)  Your physician recommends that you continue on your current medications as directed. Please refer to the Current Medication list given to you today.

## 2011-01-26 IMAGING — CR DG CHEST 2V
2 series · 2 of 2 positions shown · non-contrast
Comparison: Chest radiographs CTs 12/11/2006 and earlier.

Addendum Begins

The first line of the findings section should read:
"Extrapleural abnormal density in the lateral right upper
hemithorax corresponding to the lateral aspect of the right second
and third ribs."
Also, I discussed this case with Dr. Nipat E Aucin on the morning
of 03/16/2009.
Addendum Ends
CLINICAL DATA: 75-year-old male with "lump "removed from right
axilla 1 month ago.  Pain for 3 days.
CHEST - 2 VIEW

[w chest pa]
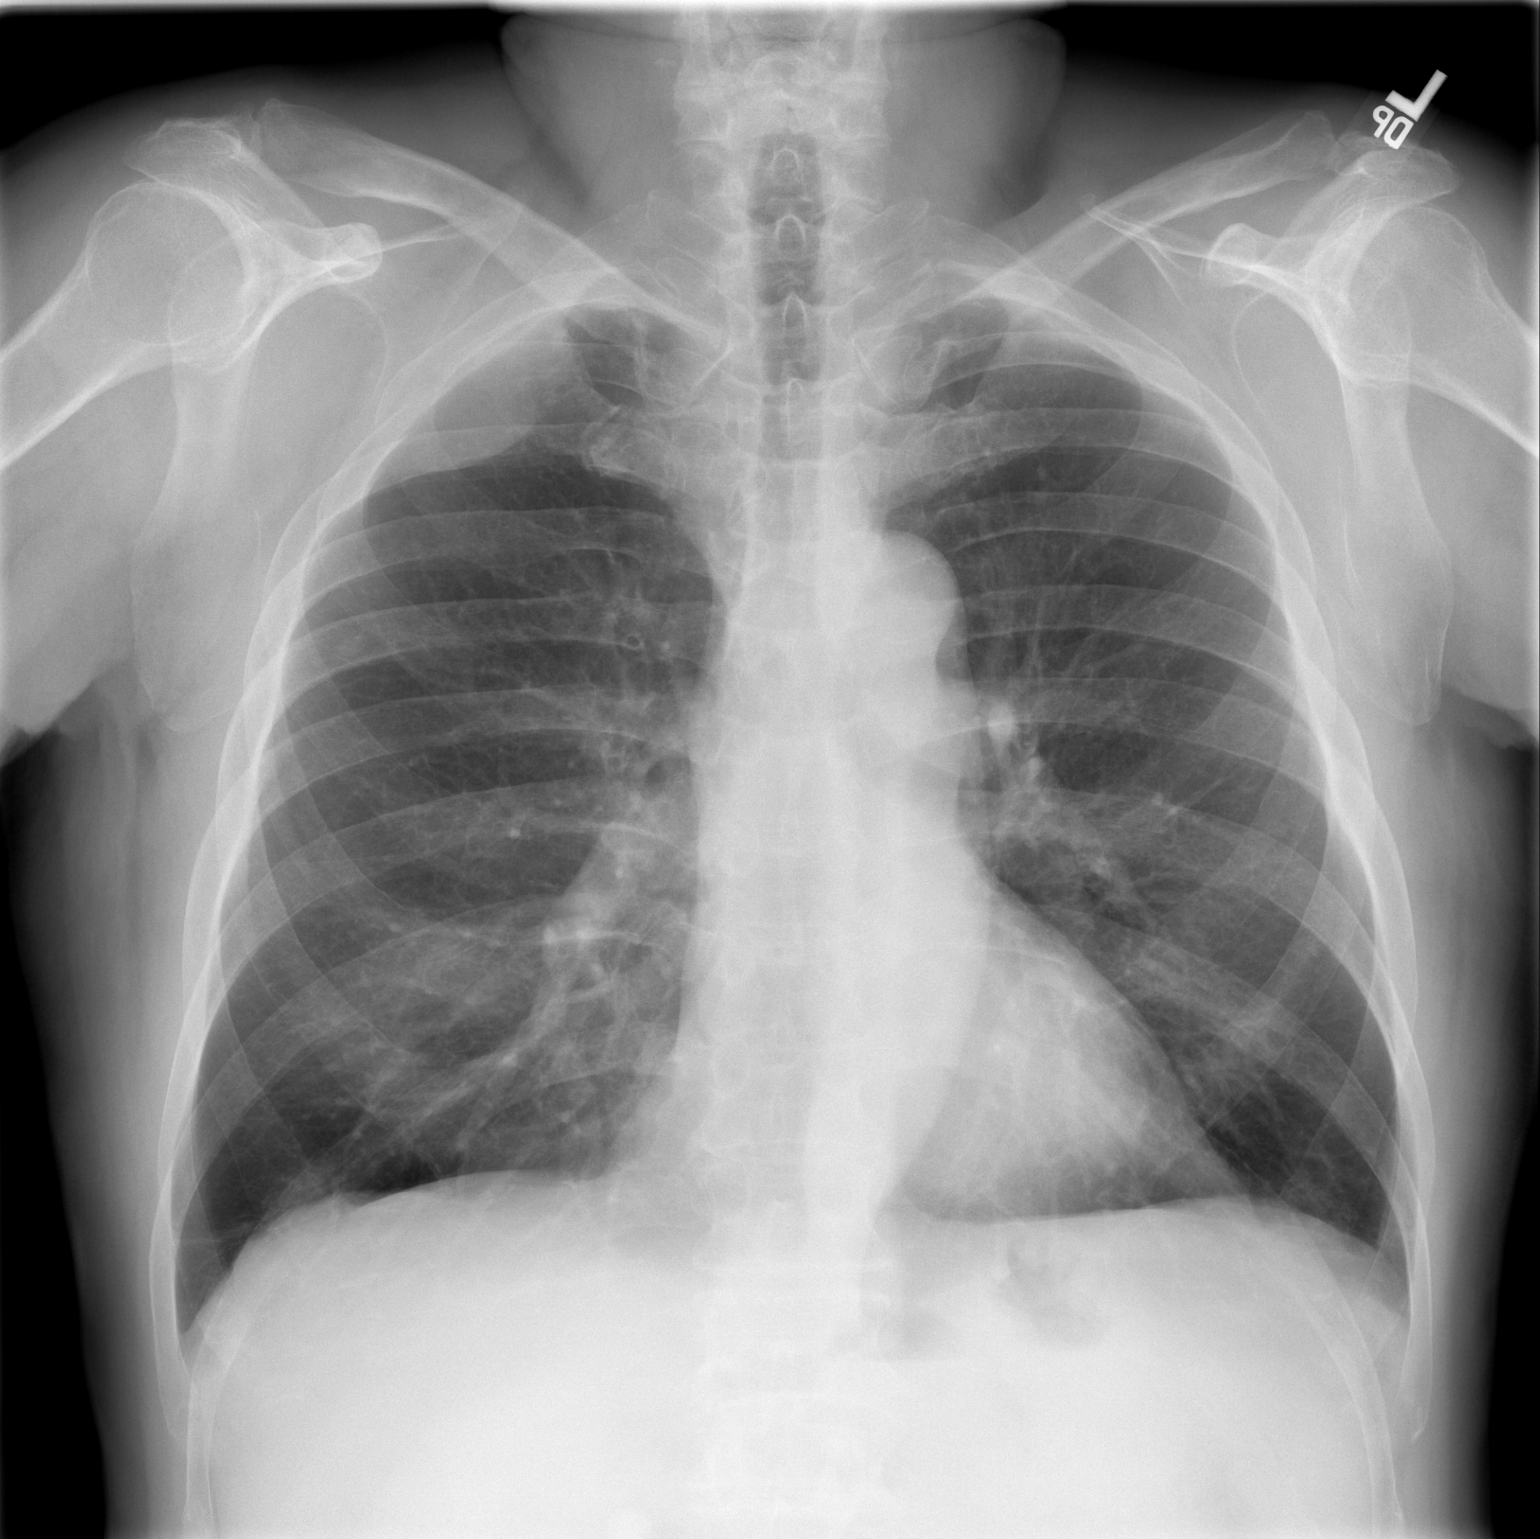

[w chest lat]
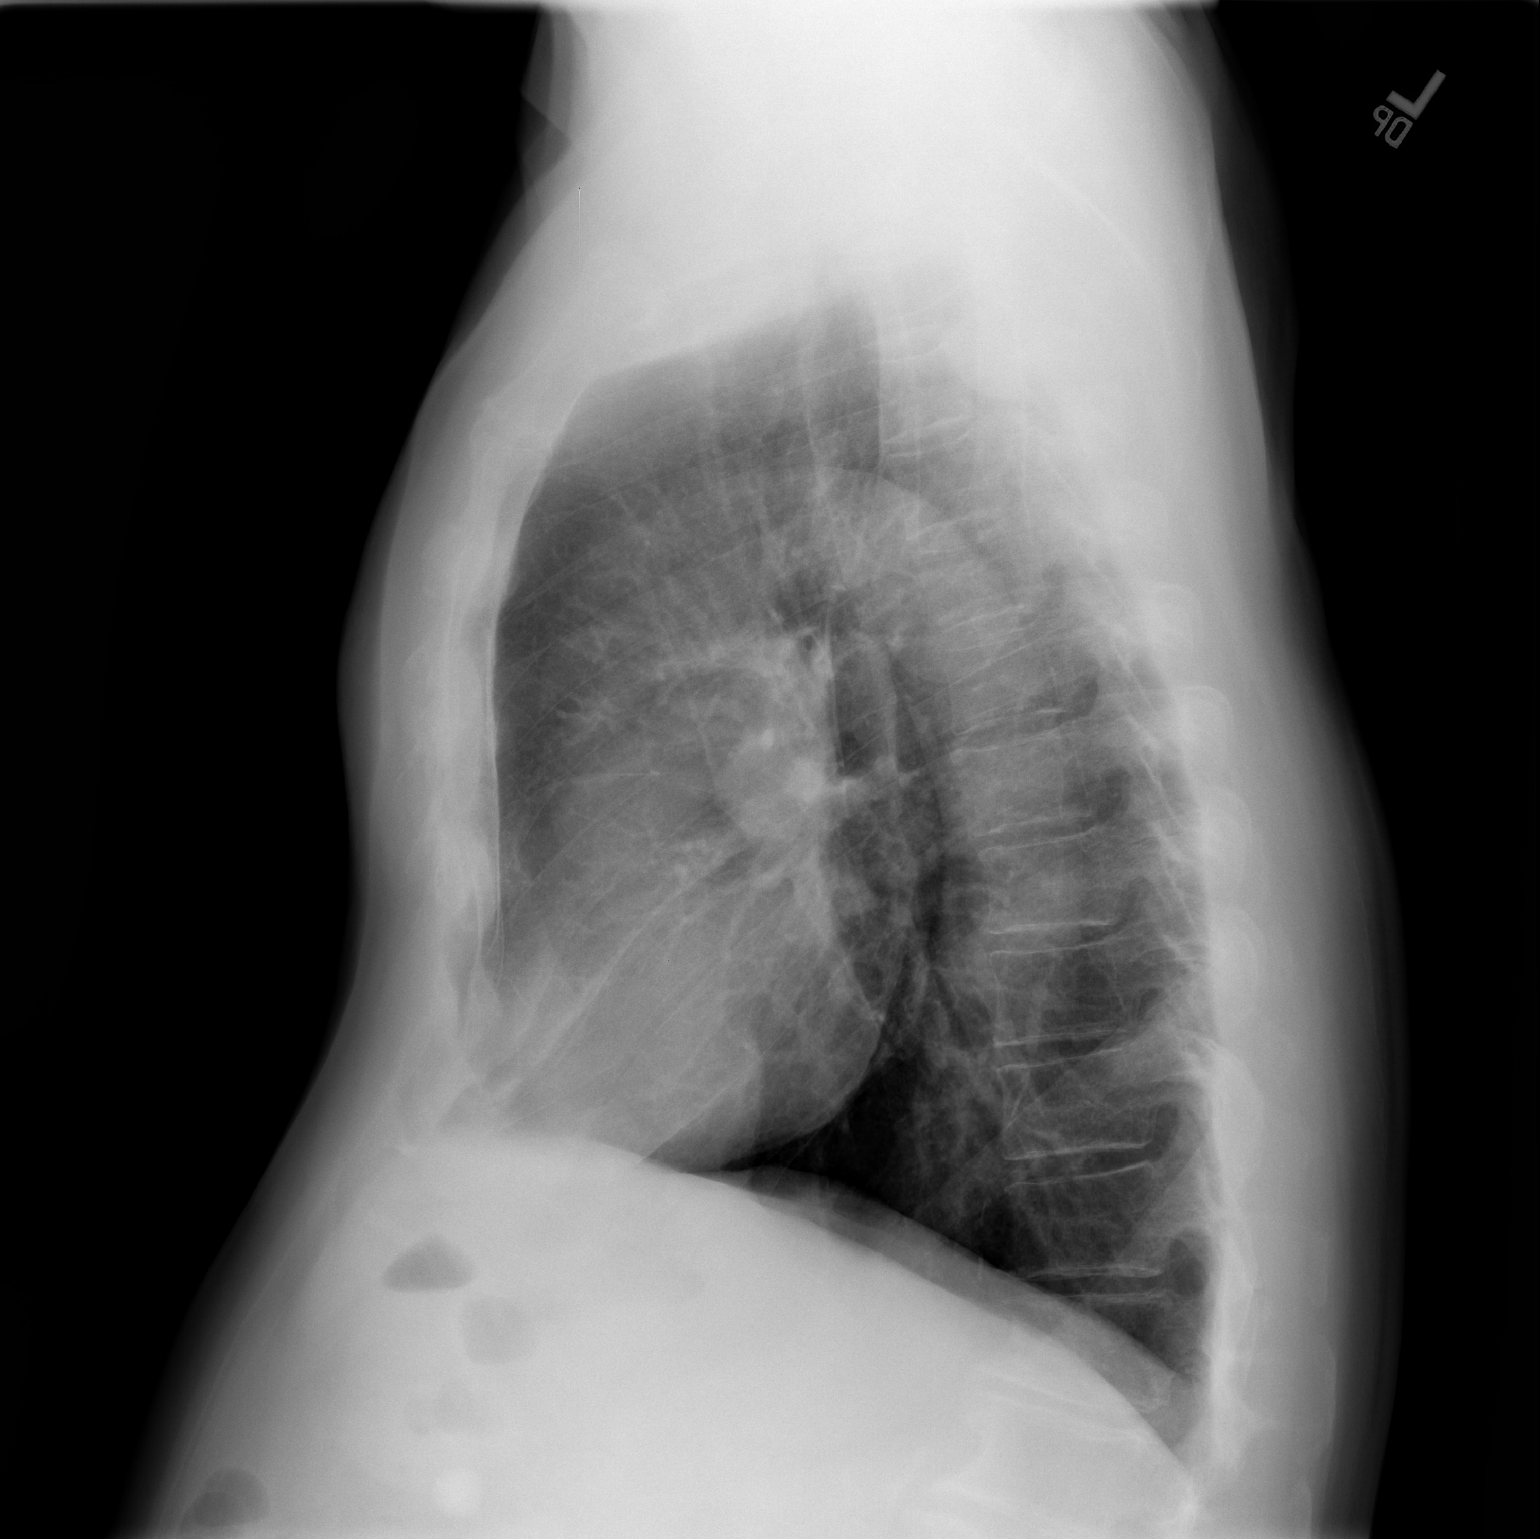

[2 of 2 positions shown; findings below may reference images not displayed]

FINDINGS: No extrapleural abnormal density in the lateral right
upper hemithorax corresponding to the lateral aspect of the right
second and third ribs.  There is destruction of the lateral aspect
of the right third rib.  No other acute bony changes identified.
Right axillary and supraclavicular soft tissues appear grossly
stable. Visualized tracheal air column is within normal limits.
Cardiac size and mediastinal contours are within normal limits.
Stable lung volumes.  No pneumothorax, pulmonary edema, pleural
effusion, consolidation, or acute airspace opacity.  Stable
visualized spine.  Chronic cholelithiasis re-identified.
IMPRESSION: 1.  Lateral right upper chest subpleural mass associated with bony
destruction of the adjacent lateral right third rib, consistent
with malignancy versus aggressive infection.  Correlate with
histology of recent right axillary surgery.  Cross sectional
imaging (chest CT with contrast) would characterize further.
2. No pulmonary metastatic disease or other acute cardiopulmonary
abnormality identified.

I discussed the above findings with Yeyuint Dongal nurse for Dr.
Nipat E Aucin by telephone at [phone] hours on 03/15/2009.

## 2011-01-28 ENCOUNTER — Encounter: Payer: Self-pay | Admitting: Cardiology

## 2011-02-14 ENCOUNTER — Encounter (HOSPITAL_BASED_OUTPATIENT_CLINIC_OR_DEPARTMENT_OTHER): Payer: MEDICARE | Admitting: Oncology

## 2011-02-14 ENCOUNTER — Other Ambulatory Visit: Payer: Self-pay | Admitting: Oncology

## 2011-02-14 DIAGNOSIS — D6959 Other secondary thrombocytopenia: Secondary | ICD-10-CM

## 2011-02-14 DIAGNOSIS — M549 Dorsalgia, unspecified: Secondary | ICD-10-CM

## 2011-02-14 DIAGNOSIS — D801 Nonfamilial hypogammaglobulinemia: Secondary | ICD-10-CM

## 2011-02-14 DIAGNOSIS — C9000 Multiple myeloma not having achieved remission: Secondary | ICD-10-CM

## 2011-02-14 LAB — CBC WITH DIFFERENTIAL/PLATELET
Eosinophils Absolute: 0.1 10*3/uL (ref 0.0–0.5)
LYMPH%: 21.4 % (ref 14.0–49.0)
MONO#: 0.7 10*3/uL (ref 0.1–0.9)
NEUT#: 4 10*3/uL (ref 1.5–6.5)
Platelets: 104 10*3/uL — ABNORMAL LOW (ref 140–400)
RBC: 4 10*6/uL — ABNORMAL LOW (ref 4.20–5.82)
WBC: 6.1 10*3/uL (ref 4.0–10.3)
lymph#: 1.3 10*3/uL (ref 0.9–3.3)

## 2011-02-14 NOTE — Letter (Signed)
Summary: [facility]   Imported By: Marylou Mccoy 02/07/2011 16:18:34  _____________________________________________________________________  External Attachment:    Type:   Image     Comment:   External Document

## 2011-02-19 NOTE — Progress Notes (Signed)
Summary: [facility]: Office Visit  [facility]: Office Visit   Imported By: Earl Many 02/08/2011 18:04:05  _____________________________________________________________________  External Attachment:    Type:   Image     Comment:   External Document

## 2011-02-22 LAB — PHOSPHORUS
Phosphorus: 1.5 mg/dL — ABNORMAL LOW (ref 2.3–4.6)
Phosphorus: 1.7 mg/dL — ABNORMAL LOW (ref 2.3–4.6)
Phosphorus: 2.4 mg/dL (ref 2.3–4.6)
Phosphorus: 2.8 mg/dL (ref 2.3–4.6)

## 2011-02-22 LAB — LACTATE DEHYDROGENASE, PLEURAL OR PERITONEAL FLUID: LD, Fluid: 197 U/L — ABNORMAL HIGH (ref 3–23)

## 2011-02-22 LAB — LIPID PANEL
Cholesterol: 82 mg/dL (ref 0–200)
LDL Cholesterol: 36 mg/dL (ref 0–99)
Total CHOL/HDL Ratio: 2.6 RATIO
Triglycerides: 75 mg/dL (ref <150)

## 2011-02-22 LAB — BLOOD GAS, ARTERIAL
Bicarbonate: 20.1 mEq/L (ref 20.0–24.0)
Drawn by: 331761
FIO2: 0.3 %
O2 Saturation: 98.5 %
Patient temperature: 98.6
TCO2: 18.8 mmol/L (ref 0–100)
pCO2 arterial: 27.9 mmHg — ABNORMAL LOW (ref 35.0–45.0)
pH, Arterial: 7.401 (ref 7.350–7.450)
pO2, Arterial: 110 mmHg — ABNORMAL HIGH (ref 80.0–100.0)

## 2011-02-22 LAB — BASIC METABOLIC PANEL
BUN: 10 mg/dL (ref 6–23)
BUN: 10 mg/dL (ref 6–23)
BUN: 11 mg/dL (ref 6–23)
CO2: 24 mEq/L (ref 19–32)
CO2: 24 mEq/L (ref 19–32)
CO2: 26 mEq/L (ref 19–32)
CO2: 26 mEq/L (ref 19–32)
CO2: 28 mEq/L (ref 19–32)
Calcium: 6.9 mg/dL — ABNORMAL LOW (ref 8.4–10.5)
Calcium: 7 mg/dL — ABNORMAL LOW (ref 8.4–10.5)
Calcium: 7.2 mg/dL — ABNORMAL LOW (ref 8.4–10.5)
Calcium: 7.4 mg/dL — ABNORMAL LOW (ref 8.4–10.5)
Chloride: 101 mEq/L (ref 96–112)
Chloride: 102 mEq/L (ref 96–112)
Chloride: 104 mEq/L (ref 96–112)
Chloride: 98 mEq/L (ref 96–112)
Creatinine, Ser: 0.82 mg/dL (ref 0.4–1.5)
Creatinine, Ser: 1.03 mg/dL (ref 0.4–1.5)
Creatinine, Ser: 1.15 mg/dL (ref 0.4–1.5)
Creatinine, Ser: 1.17 mg/dL (ref 0.4–1.5)
GFR calc Af Amer: >60 mL/min (ref >60)
GFR calc Af Amer: >60 mL/min (ref >60)
GFR calc Af Amer: >60 mL/min (ref >60)
GFR calc Af Amer: >60 mL/min (ref >60)
GFR calc non Af Amer: >60 mL/min (ref >60)
GFR calc non Af Amer: >60 mL/min (ref >60)
GFR calc non Af Amer: >60 mL/min (ref >60)
GFR calc non Af Amer: >60 mL/min (ref >60)
Glucose, Bld: 108 mg/dL — ABNORMAL HIGH (ref 70–99)
Glucose, Bld: 116 mg/dL — ABNORMAL HIGH (ref 70–99)
Glucose, Bld: 121 mg/dL — ABNORMAL HIGH (ref 70–99)
Glucose, Bld: 135 mg/dL — ABNORMAL HIGH (ref 70–99)
Glucose, Bld: 139 mg/dL — ABNORMAL HIGH (ref 70–99)
Glucose, Bld: 190 mg/dL — ABNORMAL HIGH (ref 70–99)
Potassium: 3.1 mEq/L — ABNORMAL LOW (ref 3.5–5.1)
Potassium: 3.2 mEq/L — ABNORMAL LOW (ref 3.5–5.1)
Potassium: 3.7 mEq/L (ref 3.5–5.1)
Potassium: 3.7 mEq/L (ref 3.5–5.1)
Potassium: 3.8 mEq/L (ref 3.5–5.1)
Potassium: 4 mEq/L (ref 3.5–5.1)
Sodium: 129 mEq/L — ABNORMAL LOW (ref 135–145)
Sodium: 131 mEq/L — ABNORMAL LOW (ref 135–145)
Sodium: 131 mEq/L — ABNORMAL LOW (ref 135–145)
Sodium: 132 mEq/L — ABNORMAL LOW (ref 135–145)
Sodium: 133 mEq/L — ABNORMAL LOW (ref 135–145)
Sodium: 133 mEq/L — ABNORMAL LOW (ref 135–145)

## 2011-02-22 LAB — EXPECTORATED SPUTUM ASSESSMENT W GRAM STAIN, RFLX TO RESP C

## 2011-02-22 LAB — CBC
HCT: 22.6 % — ABNORMAL LOW (ref 39.0–52.0)
HCT: 23.7 % — ABNORMAL LOW (ref 39.0–52.0)
HCT: 25.6 % — ABNORMAL LOW (ref 39.0–52.0)
HCT: 26.6 % — ABNORMAL LOW (ref 39.0–52.0)
HCT: 29.3 % — ABNORMAL LOW (ref 39.0–52.0)
Hemoglobin: 10.4 g/dL — ABNORMAL LOW (ref 13.0–17.0)
Hemoglobin: 8.9 g/dL — ABNORMAL LOW (ref 13.0–17.0)
Hemoglobin: 9.3 g/dL — ABNORMAL LOW (ref 13.0–17.0)
MCH: 33.1 pg (ref 26.0–34.0)
MCH: 34.1 pg — ABNORMAL HIGH (ref 26.0–34.0)
MCHC: 34.1 g/dL (ref 30.0–36.0)
MCHC: 34.3 g/dL (ref 30.0–36.0)
MCHC: 35 g/dL (ref 30.0–36.0)
MCV: 95.3 fL (ref 78.0–100.0)
MCV: 95.6 fL (ref 78.0–100.0)
MCV: 97.6 fL (ref 78.0–100.0)
MCV: 98.1 fL (ref 78.0–100.0)
Platelets: 106 10*3/uL — ABNORMAL LOW (ref 150–400)
Platelets: 112 10*3/uL — ABNORMAL LOW (ref 150–400)
Platelets: 88 10*3/uL — ABNORMAL LOW (ref 150–400)
Platelets: 99 10*3/uL — ABNORMAL LOW (ref 150–400)
RBC: 2.55 MIL/uL — ABNORMAL LOW (ref 4.22–5.81)
RBC: 2.61 MIL/uL — ABNORMAL LOW (ref 4.22–5.81)
RBC: 3 MIL/uL — ABNORMAL LOW (ref 4.22–5.81)
RDW: 17.4 % — ABNORMAL HIGH (ref 11.5–15.5)
RDW: 17.5 % — ABNORMAL HIGH (ref 11.5–15.5)
RDW: 17.5 % — ABNORMAL HIGH (ref 11.5–15.5)
RDW: 17.5 % — ABNORMAL HIGH (ref 11.5–15.5)
RDW: 17.8 % — ABNORMAL HIGH (ref 11.5–15.5)
WBC: 3.6 10*3/uL — ABNORMAL LOW (ref 4.0–10.5)
WBC: 3.8 10*3/uL — ABNORMAL LOW (ref 4.0–10.5)
WBC: 3.9 10*3/uL — ABNORMAL LOW (ref 4.0–10.5)
WBC: 4 10*3/uL (ref 4.0–10.5)
WBC: 4.9 10*3/uL (ref 4.0–10.5)

## 2011-02-22 LAB — CARDIAC PANEL(CRET KIN+CKTOT+MB+TROPI)
CK, MB: 1.1 ng/mL (ref 0.3–4.0)
CK, MB: 1.1 ng/mL (ref 0.3–4.0)
CK, MB: 1.6 ng/mL (ref 0.3–4.0)
CK, MB: 1.7 ng/mL (ref 0.3–4.0)
Relative Index: INVALID (ref 0.0–2.5)
Relative Index: INVALID (ref 0.0–2.5)
Relative Index: INVALID (ref 0.0–2.5)
Total CK: 19 U/L (ref 7–232)
Total CK: 26 U/L (ref 7–232)
Total CK: 42 U/L (ref 7–232)
Total CK: 44 U/L (ref 7–232)
Total CK: 50 U/L (ref 7–232)
Troponin I: 0.02 ng/mL (ref 0.00–0.06)
Troponin I: 0.04 ng/mL (ref 0.00–0.06)

## 2011-02-22 LAB — COMPREHENSIVE METABOLIC PANEL
ALT: 34 U/L (ref 0–53)
AST: 19 U/L (ref 0–37)
AST: 24 U/L (ref 0–37)
Albumin: 2.1 g/dL — ABNORMAL LOW (ref 3.5–5.2)
Albumin: 2.4 g/dL — ABNORMAL LOW (ref 3.5–5.2)
Alkaline Phosphatase: 43 U/L (ref 39–117)
BUN: 13 mg/dL (ref 6–23)
BUN: 17 mg/dL (ref 6–23)
CO2: 25 mEq/L (ref 19–32)
Calcium: 6.5 mg/dL — ABNORMAL LOW (ref 8.4–10.5)
Chloride: 95 mEq/L — ABNORMAL LOW (ref 96–112)
Chloride: 96 mEq/L (ref 96–112)
Creatinine, Ser: 0.88 mg/dL (ref 0.4–1.5)
Creatinine, Ser: 1.07 mg/dL (ref 0.4–1.5)
GFR calc Af Amer: >60 mL/min (ref >60)
GFR calc Af Amer: >60 mL/min (ref >60)
Glucose, Bld: 106 mg/dL — ABNORMAL HIGH (ref 70–99)
Potassium: 3.5 mEq/L (ref 3.5–5.1)
Total Bilirubin: 0.9 mg/dL (ref 0.3–1.2)
Total Bilirubin: 2.8 mg/dL — ABNORMAL HIGH (ref 0.3–1.2)
Total Protein: 5.5 g/dL — ABNORMAL LOW (ref 6.0–8.3)

## 2011-02-22 LAB — PROCALCITONIN: Procalcitonin: <0.5 ng/mL

## 2011-02-22 LAB — HEPATIC FUNCTION PANEL
ALT: 28 U/L (ref 0–53)
AST: 24 U/L (ref 0–37)
Albumin: 2.5 g/dL — ABNORMAL LOW (ref 3.5–5.2)
Alkaline Phosphatase: 41 U/L (ref 39–117)
Total Bilirubin: 1.4 mg/dL — ABNORMAL HIGH (ref 0.3–1.2)

## 2011-02-22 LAB — POCT CARDIAC MARKERS
CKMB, poc: <1 ng/mL — ABNORMAL LOW (ref 1.0–8.0)
Troponin i, poc: <0.05 ng/mL (ref 0.00–0.09)

## 2011-02-22 LAB — AFB CULTURE WITH SMEAR (NOT AT ARMC)

## 2011-02-22 LAB — DIFFERENTIAL
Basophils Absolute: 0 10*3/uL (ref 0.0–0.1)
Basophils Absolute: 0 10*3/uL (ref 0.0–0.1)
Eosinophils Absolute: 0.1 10*3/uL (ref 0.0–0.7)
Eosinophils Relative: 0 % (ref 0–5)
Lymphocytes Relative: 13 % (ref 12–46)
Lymphocytes Relative: 20 % (ref 12–46)
Lymphs Abs: 0.5 10*3/uL — ABNORMAL LOW (ref 0.7–4.0)
Monocytes Absolute: 0.5 10*3/uL (ref 0.1–1.0)
Monocytes Absolute: 0.8 10*3/uL (ref 0.1–1.0)
Monocytes Relative: 13 % — ABNORMAL HIGH (ref 3–12)
Neutro Abs: 2.9 10*3/uL (ref 1.7–7.7)
Neutrophils Relative %: 57 % (ref 43–77)

## 2011-02-22 LAB — GLUCOSE, SEROUS FLUID: Glucose, Fluid: 110 mg/dL

## 2011-02-22 LAB — URINALYSIS, ROUTINE W REFLEX MICROSCOPIC
Bilirubin Urine: NEGATIVE
Glucose, UA: NEGATIVE mg/dL
Ketones, ur: NEGATIVE mg/dL
Nitrite: NEGATIVE
Protein, ur: NEGATIVE mg/dL

## 2011-02-22 LAB — LIPASE, BLOOD: Lipase: 41 U/L (ref 11–59)

## 2011-02-22 LAB — PROTIME-INR
INR: 1.18 (ref 0.00–1.49)
Prothrombin Time: 15.2 seconds (ref 11.6–15.2)

## 2011-02-22 LAB — CULTURE, RESPIRATORY W GRAM STAIN

## 2011-02-22 LAB — BODY FLUID CELL COUNT WITH DIFFERENTIAL
Eos, Fluid: 3 %
Neutrophil Count, Fluid: 53 % — ABNORMAL HIGH (ref 0–25)
Total Nucleated Cell Count, Fluid: 545 cu mm (ref 0–1000)

## 2011-02-22 LAB — D-DIMER, QUANTITATIVE: D-Dimer, Quant: 3.15 ug/mL-FEU — ABNORMAL HIGH (ref 0.00–0.48)

## 2011-02-22 LAB — HEMOCCULT GUIAC POC 1CARD (OFFICE): Fecal Occult Bld: NEGATIVE

## 2011-02-22 LAB — TSH: TSH: 0.869 u[IU]/mL (ref 0.350–4.500)

## 2011-02-22 LAB — LEGIONELLA ANTIGEN, URINE

## 2011-02-22 LAB — VANCOMYCIN, TROUGH: Vancomycin Tr: 15.9 ug/mL (ref 10.0–20.0)

## 2011-02-22 LAB — CULTURE, BLOOD (ROUTINE X 2): Culture: NO GROWTH

## 2011-02-22 LAB — BODY FLUID CULTURE

## 2011-02-22 LAB — CORTISOL: Cortisol, Plasma: 23.8 ug/dL

## 2011-02-22 LAB — CK TOTAL AND CKMB (NOT AT ARMC): CK, MB: 1.4 ng/mL (ref 0.3–4.0)

## 2011-02-22 LAB — MRSA CULTURE

## 2011-02-22 LAB — STREP PNEUMONIAE URINARY ANTIGEN: Strep Pneumo Urinary Antigen: NEGATIVE

## 2011-02-22 LAB — PROTEIN, BODY FLUID

## 2011-02-22 LAB — MAGNESIUM: Magnesium: 1.8 mg/dL (ref 1.5–2.5)

## 2011-02-22 LAB — PH, BODY FLUID

## 2011-03-18 ENCOUNTER — Other Ambulatory Visit: Payer: Self-pay | Admitting: Oncology

## 2011-03-18 ENCOUNTER — Encounter (HOSPITAL_BASED_OUTPATIENT_CLINIC_OR_DEPARTMENT_OTHER): Payer: MEDICARE | Admitting: Oncology

## 2011-03-18 DIAGNOSIS — D801 Nonfamilial hypogammaglobulinemia: Secondary | ICD-10-CM

## 2011-03-18 DIAGNOSIS — C9000 Multiple myeloma not having achieved remission: Secondary | ICD-10-CM

## 2011-03-18 DIAGNOSIS — D6959 Other secondary thrombocytopenia: Secondary | ICD-10-CM

## 2011-03-18 DIAGNOSIS — I10 Essential (primary) hypertension: Secondary | ICD-10-CM

## 2011-03-18 DIAGNOSIS — M549 Dorsalgia, unspecified: Secondary | ICD-10-CM

## 2011-03-18 LAB — CBC WITH DIFFERENTIAL/PLATELET
BASO%: 0.2 % (ref 0.0–2.0)
Eosinophils Absolute: 0.1 10*3/uL (ref 0.0–0.5)
HCT: 38 % — ABNORMAL LOW (ref 38.4–49.9)
HGB: 13.1 g/dL (ref 13.0–17.1)
LYMPH%: 18.5 % (ref 14.0–49.0)
MCHC: 34.5 g/dL (ref 32.0–36.0)
MONO#: 0.6 10*3/uL (ref 0.1–0.9)
NEUT#: 3.6 10*3/uL (ref 1.5–6.5)
NEUT%: 67.6 % (ref 39.0–75.0)
Platelets: 129 10*3/uL — ABNORMAL LOW (ref 140–400)
WBC: 5.3 10*3/uL (ref 4.0–10.3)
lymph#: 1 10*3/uL (ref 0.9–3.3)

## 2011-03-19 LAB — COMPREHENSIVE METABOLIC PANEL
ALT: 20 U/L (ref 0–53)
AST: 16 U/L (ref 0–37)
AST: 26 U/L (ref 0–37)
Alkaline Phosphatase: 88 U/L (ref 39–117)
BUN: 17 mg/dL (ref 6–23)
CO2: 22 mEq/L (ref 19–32)
CO2: 29 mEq/L (ref 19–32)
Calcium: 9.7 mg/dL (ref 8.4–10.5)
Calcium: 10.2 mg/dL (ref 8.4–10.5)
Calcium: 9.6 mg/dL (ref 8.4–10.5)
Chloride: 95 mEq/L — ABNORMAL LOW (ref 96–112)
Chloride: 106 mEq/L (ref 96–112)
Creatinine, Ser: 1.1 mg/dL (ref 0.40–1.50)
Creatinine, Ser: 0.96 mg/dL (ref 0.4–1.5)
GFR calc Af Amer: >60 mL/min (ref >60)
GFR calc non Af Amer: >60 mL/min (ref >60)
GFR calc non Af Amer: >60 mL/min (ref >60)
Glucose, Bld: 101 mg/dL — ABNORMAL HIGH (ref 70–99)
Glucose, Bld: 134 mg/dL — ABNORMAL HIGH (ref 70–99)
Sodium: 137 mEq/L (ref 135–145)
Total Bilirubin: 0.5 mg/dL (ref 0.3–1.2)
Total Bilirubin: 1.3 mg/dL — ABNORMAL HIGH (ref 0.3–1.2)
Total Protein: 5.2 g/dL — ABNORMAL LOW (ref 6.0–8.3)

## 2011-03-19 LAB — POCT I-STAT 3, ART BLOOD GAS (G3+)
Bicarbonate: 24.6 mEq/L — ABNORMAL HIGH (ref 20.0–24.0)
O2 Saturation: 99 %
Patient temperature: 98.1
TCO2: 26 mmol/L (ref 0–100)
pCO2 arterial: 36 mmHg (ref 35.0–45.0)
pH, Arterial: 7.442 (ref 7.350–7.450)

## 2011-03-19 LAB — PROTEIN ELECTROPH W RFLX QUANT IMMUNOGLOBULINS
Alpha-1-Globulin: 9.1 % — ABNORMAL HIGH (ref 2.9–4.9)
Beta 2: 4.7 % (ref 3.2–6.5)
Gamma Globulin: 7.1 % — ABNORMAL LOW (ref 11.1–18.8)
M-Spike, %: NOT DETECTED g/dL

## 2011-03-19 LAB — URINALYSIS, ROUTINE W REFLEX MICROSCOPIC
Bilirubin Urine: NEGATIVE
Glucose, UA: NEGATIVE mg/dL
Hgb urine dipstick: NEGATIVE
Ketones, ur: NEGATIVE mg/dL
Protein, ur: NEGATIVE mg/dL
pH: 6 (ref 5.0–8.0)

## 2011-03-19 LAB — CBC
HCT: 34.7 % — ABNORMAL LOW (ref 39.0–52.0)
Hemoglobin: 10.7 g/dL — ABNORMAL LOW (ref 13.0–17.0)
Hemoglobin: 11.6 g/dL — ABNORMAL LOW (ref 13.0–17.0)
Hemoglobin: 15.2 g/dL (ref 13.0–17.0)
MCHC: 35.1 g/dL (ref 30.0–36.0)
MCHC: 35.2 g/dL (ref 30.0–36.0)
MCHC: 35.2 g/dL (ref 30.0–36.0)
MCV: 95.3 fL (ref 78.0–100.0)
MCV: 95.8 fL (ref 78.0–100.0)
MCV: 95.9 fL (ref 78.0–100.0)
Platelets: 230 10*3/uL (ref 150–400)
RBC: 3.18 MIL/uL — ABNORMAL LOW (ref 4.22–5.81)
RBC: 3.45 MIL/uL — ABNORMAL LOW (ref 4.22–5.81)
RBC: 4.51 MIL/uL (ref 4.22–5.81)
RDW: 13.6 % (ref 11.5–15.5)
WBC: 8.4 10*3/uL (ref 4.0–10.5)
WBC: 5.8 10*3/uL (ref 4.0–10.5)
WBC: 8.4 10*3/uL (ref 4.0–10.5)

## 2011-03-19 LAB — TYPE AND SCREEN

## 2011-03-19 LAB — BASIC METABOLIC PANEL
CO2: 26 mEq/L (ref 19–32)
Chloride: 102 mEq/L (ref 96–112)
Chloride: 106 mEq/L (ref 96–112)
Creatinine, Ser: 0.93 mg/dL (ref 0.4–1.5)
Creatinine, Ser: 0.98 mg/dL (ref 0.4–1.5)
GFR calc Af Amer: >60 mL/min (ref >60)
GFR calc Af Amer: >60 mL/min (ref >60)
Potassium: 4.2 mEq/L (ref 3.5–5.1)
Sodium: 133 mEq/L — ABNORMAL LOW (ref 135–145)
Sodium: 136 mEq/L (ref 135–145)

## 2011-03-19 LAB — IMMUNOFIXATION ELECTROPHORESIS
IgA: 52 mg/dL — ABNORMAL LOW (ref 68–378)
IgG (Immunoglobin G), Serum: 386 mg/dL — ABNORMAL LOW (ref 694–1618)
IgM, Serum: 20 mg/dL — ABNORMAL LOW (ref 60–263)
Total Protein ELP: 5.2 g/dL — ABNORMAL LOW (ref 6.0–8.3)

## 2011-03-19 LAB — BONE MARROW EXAM

## 2011-03-19 LAB — BLOOD GAS, ARTERIAL
Acid-base deficit: 0.9 mmol/L (ref 0.0–2.0)
Drawn by: 206361
TCO2: 23.8 mmol/L (ref 0–100)
pCO2 arterial: 34.8 mmHg — ABNORMAL LOW (ref 35.0–45.0)
pH, Arterial: 7.431 (ref 7.350–7.450)
pO2, Arterial: 85.6 mmHg (ref 80.0–100.0)

## 2011-03-19 LAB — ABO/RH: ABO/RH(D): O NEG

## 2011-03-19 LAB — DIFFERENTIAL
Eosinophils Relative: 3 % (ref 0–5)
Lymphocytes Relative: 22 % (ref 12–46)
Lymphs Abs: 1.9 10*3/uL (ref 0.7–4.0)
Neutrophils Relative %: 67 % (ref 43–77)

## 2011-03-19 LAB — UIFE/LIGHT CHAINS/TP QN, 24-HR UR
Albumin, U: DETECTED
Beta, Urine: DETECTED — AB
Free Lambda Excretion/Day: 2197 mg/d
Free Lambda Lt Chains,Ur: 169 mg/dL — ABNORMAL HIGH (ref 0.08–1.01)
Total Protein, Urine-Ur/day: 2243 mg/d — ABNORMAL HIGH (ref 10–140)
Total Protein, Urine: 172.5 mg/dL

## 2011-03-19 LAB — KAPPA/LAMBDA LIGHT CHAINS
Kappa free light chain: 0.68 mg/dL (ref 0.33–1.94)
Kappa:Lambda Ratio: 0.48 (ref 0.26–1.65)

## 2011-03-19 LAB — PROTIME-INR: Prothrombin Time: 14.1 seconds (ref 11.6–15.2)

## 2011-03-20 LAB — GLUCOSE, CAPILLARY: Glucose-Capillary: 108 mg/dL — ABNORMAL HIGH (ref 70–99)

## 2011-03-26 LAB — DIFFERENTIAL
Basophils Relative: 0 % (ref 0–1)
Eosinophils Absolute: 0.1 10*3/uL (ref 0.0–0.7)
Monocytes Absolute: 0.7 10*3/uL (ref 0.1–1.0)
Monocytes Relative: 9 % (ref 3–12)

## 2011-03-26 LAB — COMPREHENSIVE METABOLIC PANEL
ALT: 30 U/L (ref 0–53)
AST: 27 U/L (ref 0–37)
Albumin: 4.6 g/dL (ref 3.5–5.2)
Alkaline Phosphatase: 85 U/L (ref 39–117)
Potassium: 4.6 mEq/L (ref 3.5–5.1)
Sodium: 141 mEq/L (ref 135–145)
Total Protein: 6.9 g/dL (ref 6.0–8.3)

## 2011-03-26 LAB — CBC
Hemoglobin: 15.9 g/dL (ref 13.0–17.0)
Platelets: 148 10*3/uL — ABNORMAL LOW (ref 150–400)
RDW: 13.3 % (ref 11.5–15.5)

## 2011-04-11 ENCOUNTER — Encounter (HOSPITAL_BASED_OUTPATIENT_CLINIC_OR_DEPARTMENT_OTHER): Payer: MEDICARE | Admitting: Oncology

## 2011-04-11 ENCOUNTER — Other Ambulatory Visit: Payer: Self-pay | Admitting: Oncology

## 2011-04-11 DIAGNOSIS — D801 Nonfamilial hypogammaglobulinemia: Secondary | ICD-10-CM

## 2011-04-11 DIAGNOSIS — C9000 Multiple myeloma not having achieved remission: Secondary | ICD-10-CM

## 2011-04-11 DIAGNOSIS — M549 Dorsalgia, unspecified: Secondary | ICD-10-CM

## 2011-04-11 DIAGNOSIS — D6959 Other secondary thrombocytopenia: Secondary | ICD-10-CM

## 2011-04-11 LAB — BASIC METABOLIC PANEL
BUN: 25 mg/dL — ABNORMAL HIGH (ref 6–23)
Creatinine, Ser: 1.1 mg/dL (ref 0.40–1.50)

## 2011-04-13 ENCOUNTER — Encounter: Payer: Self-pay | Admitting: Cardiology

## 2011-04-16 ENCOUNTER — Ambulatory Visit: Payer: MEDICARE | Attending: Radiation Oncology | Admitting: Radiation Oncology

## 2011-04-17 ENCOUNTER — Encounter: Payer: Self-pay | Admitting: Cardiology

## 2011-04-17 ENCOUNTER — Ambulatory Visit (INDEPENDENT_AMBULATORY_CARE_PROVIDER_SITE_OTHER): Payer: MEDICARE | Admitting: Cardiology

## 2011-04-17 VITALS — BP 120/71 | HR 73 | Ht 68.0 in | Wt 186.0 lb

## 2011-04-17 DIAGNOSIS — I4891 Unspecified atrial fibrillation: Secondary | ICD-10-CM

## 2011-04-17 DIAGNOSIS — I2789 Other specified pulmonary heart diseases: Secondary | ICD-10-CM

## 2011-04-17 NOTE — Progress Notes (Signed)
HPI The patient presents to the atrial fibrillation. Since I last saw him he has had no new cardiovascular complaints. He denies any palpitations, presyncope or syncope. He has been through radiation treatment for management of his vocal cord cancer. Has been seeing with this and not exercising as much though he has been doing some walking at [employer]. With activity he's not having any chest pressure, neck or arm discomfort. He is having no shortness of breath, PND or orthopnea. He does have a little bit of bleeding on the toilet paper occasionally.  Allergies  Allergen Reactions  . Celebrex (Celecoxib)     Current Outpatient Prescriptions  Medication Sig Dispense Refill  . aspirin 81 MG tablet Take 81 mg by mouth daily.        . dabigatran (PRADAXA) 150 MG CAPS Take 150 mg by mouth every 12 (twelve) hours.        Marland Kitchen esomeprazole (NEXIUM) 40 MG capsule Take 40 mg by mouth as needed.       . Multiple Vitamin (MULTIVITAMIN) capsule Take 1 capsule by mouth daily.        Marland Kitchen olmesartan-hydrochlorothiazide (BENICAR HCT) 20-12.5 MG per tablet Take 1 tablet by mouth daily.       . saw palmetto 160 MG capsule Take 160 mg by mouth 2 (two) times daily.        . Tamsulosin HCl (FLOMAX) 0.4 MG CAPS Take by mouth.        . vitamin B-12 (CYANOCOBALAMIN) 100 MCG tablet Take 100 mcg by mouth daily.          Past Medical History  Diagnosis Date  . Multiple myeloma   . CAD (coronary artery disease)   . Hypertension   . Dyslipidemia   . Arrhythmia     atr ial fibrillation  . Cholelithiasis   . Aortic aneurysm   . GERD (gastroesophageal reflux disease)   . Hiatal hernia   . Esophageal stricture   . Anemia   . History of BPH     Past Surgical History  Procedure Date  . Hip surgery   . Back surgery   . Trigeminal neuralgia   . Appendectomy   . Tonsillectomy   . Right chest wall resection     of second ,third,and fourth rib    ROS:  As stated in the HPI and negative for all other  systems.  PHYSICAL EXAM BP 120/71  Pulse 73  Ht 5\' 8"  (1.727 m)  Wt 186 lb (84.369 kg)  BMI 28.28 kg/m2 GENERAL:  Well appearing HEENT:  Pupils equal round and reactive, fundi not visualized, oral mucosa unremarkable NECK:  No jugular venous distention, waveform within normal limits, carotid upstroke brisk and symmetric, no bruits, no thyromegaly LYMPHATICS:  No cervical, inguinal adenopathy LUNGS:  Clear to auscultation bilaterally BACK:  No CVA tenderness CHEST:  Unremarkable HEART:  PMI not displaced or sustained,S1 and S2 within normal limits, no S3, no S4, no clicks, no rubs, no murmurs ABD:  Flat, positive bowel sounds normal in frequency in pitch, no bruits, no rebound, no guarding, no midline pulsatile mass, no hepatomegaly, no splenomegaly EXT:  2 plus pulses throughout, no edema, no cyanosis no clubbing SKIN:  No rashes no nodules NEURO:  Cranial nerves II through XII grossly intact, motor grossly intact throughout PSYCH:  Cognitively intact, oriented to person place and time   EKG;  Sinus rhythm, rate 73, left axis deviation, early transition, incomplete right bundle branch block, no acute ST-T wave  changes ASSESSMENT AND PLAN

## 2011-04-17 NOTE — Patient Instructions (Signed)
Stop Pradaxa Continue all other medications Follow up with Dr Antoine Poche in 6 months

## 2011-04-17 NOTE — Assessment & Plan Note (Signed)
At this point I think it is most prudent to stop his Pradaxa.  He has had no evidence her relation since he was hospitalized with pneumonia despite the stress he been under recently. He will let me know if he has any symptoms going forward.

## 2011-04-17 NOTE — Assessment & Plan Note (Signed)
He does have a mildly reduced ejection fraction and pulmonary artery pressures peak of 51 mm by echo. I will follow clinically and consider repeating this when I see him again in 6 months.

## 2011-04-23 NOTE — Letter (Signed)
September 27, 2009   [name] B. Richard [name] Perna, MD  [address]  [place], [place] [address]   Re:  Richard [name], Richard [name]                DOB:  05/04/1933   Dear Nida Boatman:   I saw the patient in the office today and reviewed his CT scan.  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  I think that this is probably  in the area where we could not resect more of the transverse process in  the vertebral body.  It looks like there is either recurrence or just  further enlargement of the previous plasmacytoma.  I hope he gets a good  response to his new medication and I will see him again in 4 months with  a chest x-ray.  His blood pressure was 143/57, pulse 68, respirations  18, and sats are 98%.   Sincerely,   Ines Bloomer, M.D.  Electronically Signed   DPB/MEDQ  D:  09/27/2009  T:  09/28/2009  Job:  [id number]

## 2011-04-23 NOTE — Discharge Summary (Signed)
NAMEDeago, Richard [name]                ACCOUNT NO.:  [id number]   MEDICAL RECORD NO.:  [id number]          PATIENT TYPE:  INP   LOCATION:  3316                         FACILITY:  [facility]   PHYSICIAN:  Ines Bloomer, M.D. DATE OF BIRTH:  July 28, 1933   DATE OF ADMISSION:  04/17/2009  DATE OF DISCHARGE:                               DISCHARGE SUMMARY   ADMITTING DIAGNOSES:  1. Right third rib mass.  2. History of hypertension.   DISCHARGE DIAGNOSES:  1. Plasmacytoma.  2. History of hypertension.  3. Postoperative thrombocytopenia.   PROCEDURE:  Right VATS, right thoracotomy, right chest resection of  right ribs 2 through 4 by Dr. Edwyna Shell on Apr 17, 2009.   HISTORY OF PRESENTING ILLNESS:  This is a 77 year old Caucasian male who  was originally seen by Dr. Edwyna Shell in the office on March 29, 2009.  According to medical records, the patient had an excision of a right  axilla mass.  During this workup, he was found to have a 2.1- x 3.8-cm  mass on the third right rib with rib destruction.  He then had  complaints of right chest pain, especially when he coughed.  He denied  any fever, chills, or sputum.  The patient then underwent a PET scan on  April 04, 2009.  Results revealed a pleural-based hypermetabolic mass  with SCV max of 11.3.  The mass measured 4.2 cm long, rib destruction  present, and no additional hypermetabolic pulmonary nodules or lymph  nodes in the hilum or mediastinum.  The patient was admitted to [facility] on Apr 17, 2009, to undergo the right VATS, right thoracotomy, and  resection of right ribs 2 through 4.  Pathology did reveal a  plasmacytoma.   BRIEF HOSPITAL COURSE STAY:  The patient was found to have a 2-3/7 air  leak on postop day 1.  Art line was removed on this date.  Suction was  decreased.  On chest x-ray, the patient was found to have a small  subpulmonic right-sided pneumothorax.  Posterior chest tube was removed  on postoperative day #2.  A followup  chest x-ray revealed no  pneumothorax, atelectasis at the right lung base.  Remaining chest tube  was placed to waterseal.  An oncology consult was obtained.  Lab workup  was initiated regarding the plasmacytoma.  Currently on postop day #4,  the patient had a T-max of 99.1, heart rate was in the 70s-80s, BP  145/57, O2 sat 95-96% on room air.  Remaining chest tube had minimal  output and no air leak.   PHYSICAL EXAMINATION:  CARDIOVASCULAR:  Regular rate and rhythm.  There  is some rhonchi on the right.  ABDOMEN:  Soft, nontender.  Bowel sounds present.  EXTREMITIES:  No lower extremity edema.  CHEST:  Right chest wound is clean and dry.  There is some minor  serosanguineous ooze around the chest tube site.  Remaining chest tube  will most likely be removed today.  PCA will then be removed after the  chest tube is removed.  Provided the patient remains afebrile,  hemodynamically stable,  and the chest x-ray stable, he will be  discharged on Apr 22, 2009.   Latest laboratory studies are as follows:  Last BMET done on Apr 20, 2009, potassium 4.2.  BUN and creatinine were 12 and 0.98 respectively.  Last CBC done on Apr 19, 2009, H and H 10.7 and 30.3 respectively, white  count of 9100, platelet count of 107,000.  Last chest x-ray done on Apr 21, 2009, showed no pneumothorax, improvement in lung volumes.   Discharge instructions are as follows:  The patient is to remain on a  low-sodium, heart-healthy diet.  He may shower.  He is not to lift or  drive for 2 weeks.  He is to continue with his breathing exercise daily.  He is to walk everyday.  He has a followup appointment to see Dr. Edwyna Shell  on Apr 27, 2009, at 4:30 p.m.  Prior to this office appointment, a chest  x-ray will be obtained.  In addition, chest tube suture and any staples  will be removed at this office appointment.  The patient also needs to  make an appointment to follow up with Oncology within 2 weeks.  The  patient may  use soap and water and let his wounds open to the air.      Richard [name], [name]      Ines Bloomer, M.D.  Electronically Signed    DZ/MEDQ  D:  04/21/2009  T:  04/22/2009  Job:  [id number]   cc:   Richard [name]. Richard [name], M.D.  Richard [name], M.D.

## 2011-04-23 NOTE — H&P (Signed)
NAMERaylyn, Richard [name]                ACCOUNT NO.:  [id number]   MEDICAL RECORD NO.:  [id number]          PATIENT TYPE:  INP   LOCATION:  NA                           FACILITY:  [facility]   PHYSICIAN:  Ines Bloomer, M.D. DATE OF BIRTH:  08-Mar-1933   DATE OF ADMISSION:  DATE OF DISCHARGE:                              HISTORY & PHYSICAL   CHIEF COMPLAINT:  Right chest pain.   HISTORY OF PRESENT ILLNESS:  This 77 year old patient had excision of a  right axillary mass that turned out to be a lipoma but on CT scan was  found to have a 2.1 x 3.8 mass in the right third rib with rib  destruction.  PET scan was done that showed this is a pleural-based mass  that was either in the ribs or possibly extending into the lung.  He has  some right chest pain with cough.  CT scan in 2007 was negative.  He has  had no hemoptysis, fever, chills or excessive sputum.   ALLERGIES:  He has no allergies.   MEDICATIONS:  1. Flomax 0.4 grams a day.  2. Vitamin B12.  3. Multivites.  4. Avapro 150 mg daily.  5. Saw Palmetto 160 mg twice a day.  6. Benicar for hypertension.  7. Tamsulosin once a day.   PAST MEDICAL HISTORY:  He has cardiac disease and dyslipidemia.   PAST SURGICAL HISTORY:  He has had a previous hip replacement surgery,  back surgery, trigeminal neuralgia, appendectomy and tonsillectomy.   FAMILY HISTORY:  Positive for cardiac disease.   SOCIAL HISTORY:  He is single, has 2 children, is retired.  Quit smoking  in 1995.  Does not drink alcohol on a regular basis.   REVIEW OF SYSTEMS:  He is 5 feet 8 inches, he is 180 pounds.  CARDIAC:  No angina or atrial fibrillation.  PULMONARY:  No hemoptysis, fever,  chills or excessive sputum.  GI: No nausea, vomiting, constipation,  diarrhea.  GU:  He has kidney disease.  He has frequent urination.  Is  on Flomax.  VASCULAR:  No claudication, DVT, TIA.  NEUROLOGICAL:  No  dizziness, headaches, blackouts or seizures.  MUSCULOSKELETAL:  No  arthritis.  ENT:  No changes in his eyesight or hearing.  PSYCHIATRIC:  No psychiatric illnesses.  HEMATOLOGICAL:  No problems with bleeding,  clotting disorders or anemia.   PHYSICAL EXAMINATION:  He is a well-developed Caucasian male in no acute  distress.  HEENT:  Unremarkable.  NECK:  Supple without thyromegaly.  There is no supraclavicular or  axillary adenopathy.  CHEST:  Clear to auscultation and percussion.  HEART:  Regular sinus rhythm, no murmurs.  ABDOMEN:  Soft.  No hepatosplenomegaly.  Pulses are 2+.  There is no clubbing or edema.  NEUROLOGICAL:  He is oriented x3.  Sensory and motor intact.  Cranial  nerves intact.   IMPRESSION:  1. Right upper lobe third rib lesion.  2. History of hypertension.  3. History of trigeminal neuralgia.  4. History of osteoarthritis.   PLAN:  Right thoracotomy and resection of right third rib and  possibly a  right upper lobectomy.      Ines Bloomer, M.D.  Electronically Signed     DPB/MEDQ  D:  04/14/2009  T:  04/14/2009  Job:  [id number]

## 2011-04-23 NOTE — Letter (Signed)
June 28, 2009   Leighton Roach. Truett Perna, MD  [address]  [place], [place] [address]   Re:  KING, PINZON                DOB:  1933-09-08   Dear Dr. Truett Perna:   The patient came today.  His incisions are well healed.  You can see his  rib resection has healed well where we took out the frozen cytoma.  His  blood pressure was 133/76, pulse 78, respirations were 18, and  saturations were 94%.  He is doing well overall and I will see him back  again in 3 months with a chest x-ray.   Sincerely,   Ines Bloomer, M.D.  Electronically Signed   DPB/MEDQ  D:  06/28/2009  T:  06/29/2009  Job:  [id number]   cc:   Adolph Pollack, M.D.  Artist Pais Kathrynn Running, M.D.

## 2011-04-23 NOTE — Letter (Signed)
March 29, 2009   Adolph Pollack, MD  [phone]  [place], [place] [address]   Re:  Richard [name]                DOB:  [birth year]   Dear Richard [name],   I appreciate the opportunity of seeing the patient.  This 77 year old  patient apparently had excision of a painful right axilla mass and on CT  scan was found to have a right third rib 2.1 x 3.8 mass with rib  destruction.  He started having right chest pain particularly when he  coughs.  His CT scan done in 2007, this lesion was not there.  He has  had no fever, chills, or excessive sputum.   PAST MEDICAL HISTORY:  He quit smoking in 1995.  He has no allergies.   His medications include Flomax 0.4 daily, vitamin B12, multivitamins,  Avapro 150 mg daily, Saw Palmetto 160 mg twice a day, Benicar,  tamsulosin once a day.   PAST SURGICAL HISTORY:  He had right hip replacement surgery, back  surgery, trigeminal neuralgia, appendectomy, and tonsillectomy.   FAMILY HISTORY:  Positive for cardiac disease.   SOCIAL HISTORY:  He is single, has 2 children, retired; does not drink  alcohol on a regular basis.  Quit smoking in 1995.   REVIEW OF SYSTEMS:  He is 180 pounds.  He is 5 feet 8.  Cardiac:  No  angina or atrial fibrillation.  Pulmonary:  No hemoptysis, fever, or  chills.  GI:  He had some dysphagia.  GU:  No kidney disease, dysuria,  but has frequent urination and is on Flomax.  Vascular:  No  claudication, DVT, or TIAs.  Neurological:  She has some headaches.  See  past medical history.  Musculoskeletal:  See past medical history.  Psychiatric:  No depression or nervousness.  Eyes/ENT:  No change in his  eyesight or hearing.  Hematological:  No problems with bleeding,  clotting, or anemia.   PHYSICAL EXAMINATION:  GENERAL:  He is a well-developed Caucasian male  in no acute distress.  VITAL SIGNS:  His blood pressure was 158/87, pulse 75, respirations 18,  and saturations were 97%.  HEENT:  Head is atraumatic.   Eyes, pupils equal and reactive to light  and accommodation.  Extraocular movements normal.  Ears, tympanic  membranes are intact.  Nose, there is no septal deviation.  Throat  without lesions.  NECK:  Supple without thyromegaly.  CHEST:  Clear to auscultation and percussion.  HEART:  Regular sinus rhythm.  No murmurs.  ABDOMEN:  Soft.  No hepatosplenomegaly.  EXTREMITIES:  Pulses 2+.  There is no clubbing or edema.  NEUROLOGICAL:  He is oriented x3.   I discussed the situation with he and his family, and I have recommended  he get a PET scan and if that is just positive at the area of the  lesion, then he will need a chest wall resection of this.  If it shows  other areas that need to be worked up, then we will have to probably do  a needle biopsy prior to considering resection.  I appreciate the  opportunity of seeing the patient.   Sincerely,   Ines Bloomer, [name].D.  Electronically Signed   DPB/MEDQ  D:  03/29/2009  T:  03/30/2009  Job:  [id number]   cc:   J. [name], MD

## 2011-04-23 NOTE — Op Note (Signed)
NAMEDanial, [name]                ACCOUNT NO.:  [id number]   MEDICAL RECORD NO.:  [id number]          PATIENT TYPE:  INP   LOCATION:  2312                         FACILITY:  [facility]   PHYSICIAN:  Ines Bloomer, M.D. DATE OF BIRTH:  Jul 24, 1933   DATE OF PROCEDURE:  04/17/2009  DATE OF DISCHARGE:                               OPERATIVE REPORT   PREOPERATIVE DIAGNOSIS:  Tumor of right third rib.   POSTOPERATIVE DIAGNOSIS:  Tumor of right third rib.   OPERATION PERFORMED:  Right chest wall resection of second, third and  fourth rib.   SURGEON:  Ines Bloomer, MD   GENERAL:  Anesthesia.   This is a 77 year old patient who was found to have right chest wall  pain was sent to the emergency room was found to have a right third rib  lesion, had a negative history as far as lung cancer.  PET scan  thoroughly was involving the right upper lobe and then going into the  right third rib, though on CT scan looks like it has more than isolated  right third rib lesion with marked destruction of the bone.  We elected  to do a primary chest wall resection.  After general anesthesia, a dual-  lumen tube was inserted.  The patient was carried to the right lateral  thoracotomy position.  Two trocar sites were made in the anterior,  posterior at seventh intercostal space.  Two trocars were inserted.  The  lesion was seen in the third rib and pictures were taken and then a  posterolateral thoracotomy was made over the fifth intercostal space.  The fifth intercostal space was entered and through that we could  palpate the rib superiorly.  A 0-degree scope had been used previously  to evaluate the situation, with that we decided to do the chest wall  resection, so we started in the fourth intercostal space dividing the  fourth intercostal space and then dividing the fourth rib anteriorly  with electrocautery down to the bone and then divided with the third  intercostal muscle and then dividing the  intercostal muscle of the first  rib.  I then went posteriorly and dissected out the paraspinous muscles  exposing the transverse process and the rib articulation on the fourth,  third, and second rib.  I then used to accommodate periosteal elevator  to dissect the rib at the transverse process rib articulation for the  fourth, third, and second rib.  When they had been dissected out, we  then divided the rib proximally using a rib sheer of the fourth, third,  and second rib.  Then posteriorly, we started the fourth rib and divided  the rib below the disk articulation and then dissected out and divided  this third rib at the same spot and then the second rib again at the  insertion near the transverse process.  Electrocautery was used to  divide the pleura posteriorly and the rib was removed.  We could see the  second and third intercostal nerves and these were divided with  electrocautery.  All bleeding was electrocoagulated and  there was some  bleeding from the bone for which we put ostein on the bone and then with  2 chest tube to the trocar sites and sutured in place with 0 silk.  Marcaine block was done in the usual fashion.  A single On-Q was  inserted in the usual fashion and subsequently one pericostal around the  fourth and fifth rib, and  then we closed the muscle layers allowing the scapula to fill the whole  with running #1 Vicryl in the muscle layer and interrupted #1 Vicryl in  both muscle layers, 2-0 Vicryl with subcutaneous tissue and Ethicon skin  clips.  The patient was returned recovery room in stable condition.      Ines Bloomer, M.D.  Electronically Signed     DPB/MEDQ  D:  04/17/2009  T:  04/18/2009  Job:  [id number]

## 2011-04-23 NOTE — Letter (Signed)
August 21, 2010   Leighton Roach. Truett Perna, MD  [address]  [place], [place] [address]   Re:  Richard [name], Richard [name] [place] M                DOB:  Aug 20, 1933   Dear Nida Boatman:   I saw the patient back today, and his chest x-ray showed improved  aeration of his left base with marked decrease in his left pleural  effusion.  There is just a tiny fusion there.  His lungs are clear to  auscultation and percussion bilaterally.  His blood pressure is 141/72,  pulse 76, respirations 18, sats were 93%.  He feels much better, and he  will see you in October and I will be happy to see him again if he has  any future problems.   Ines Bloomer, M.D.  Electronically Signed   DPB/MEDQ  D:  08/21/2010  T:  08/22/2010  Job:  [id number]

## 2011-04-23 NOTE — Letter (Signed)
Apr 27, 2009   Dr. [name]  [address]  [place], [place] [address]   Re:  MAKAEL, STEIN                DOB:  10-18-1933   Dear Dr. 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  We  removed his sutures.  His blood pressure was 132/77, pulse 85,  respirations 18, and saturations were 94%.  He is doing well overall.  He is being referred to Dr. Mancel Bale for his evaluation and Dr.  Truett Perna feels he has a strong possibility of having multiple myeloma.  I will see him back again in 3 weeks for continued postop followup.   Ines Bloomer, M.D.  Electronically Signed   DPB/MEDQ  D:  04/27/2009  T:  04/28/2009  Job:  [id number]   cc:   Adolph Pollack, M.D.

## 2011-04-23 NOTE — Assessment & Plan Note (Signed)
OFFICE VISIT   [name], [name]  DOB:  [birth year]                                        May 17, 2009  CHART #:  [id number]   The patient came today.  He has been followed by Dr. Truett Perna for his  plasmacytoma.  His incision was well healed.  His chest x-ray was  stable.  He is going great overall with minimal pain.  His blood  pressure was 150/81, pulse 74, respirations 18, and sats were 96%.  I  will see him back again in 6 weeks with another chest x-ray for final  check.   Ines Bloomer, M.D.  Electronically Signed   DPB/MEDQ  D:  05/17/2009  T:  05/17/2009  Job:  [id number]

## 2011-04-23 NOTE — Assessment & Plan Note (Signed)
OFFICE VISIT   [name], [name]  DOB:  1933-06-28                                        April 04, 2009  CHART #:  [id number]   I called the patient and talked to his daughter, Richard [name], and  explained to him that the PET scan was positive with bone destruction  and there is no evidence of any other metastatic disease that this is a  pleura-based mass in the right upper lobe consistent with primary lung  cancer or it could be a primary bone cancer.  Whatever the case is, we  have scheduled a right chest wall resection and possibly wedge resection  and node sampling of the lesion depending on what we find on the 10th.  The daughter said that the patient will call me if he has any further  questions.   Ines Bloomer, M.D.  Electronically Signed   DPB/MEDQ  D:  04/04/2009  T:  04/05/2009  Job:  [id number]

## 2011-04-23 NOTE — Assessment & Plan Note (Signed)
OFFICE VISIT   VANDEVELDE, [name]  DOB:  07/04/1933                                        January 24, 2010  CHART #:  [id number]   The patient came today.  His chest x-ray showed normal postoperative  changes.  We removed his plasmacytoma.  He was on 2 types of medication  from Dr. Truett Perna.  He is doing well overall.  I will let Dr. Truett Perna  follow him, and I will see him back again if he has any future problems.  Blood pressure is 138/82, pulse 82, respirations 18, sats were 90.   Ines Bloomer, M.D.  Electronically Signed   DPB/MEDQ  D:  01/24/2010  T:  01/25/2010  Job:  [id number]

## 2011-04-23 NOTE — Op Note (Signed)
NAMEAaron, Bostwick Aldyn                ACCOUNT NO.:  [id number]   MEDICAL RECORD NO.:  [id number]          PATIENT TYPE:  AMB   LOCATION:  [facility]                         [facility]AC[facility]   PHYSICIAN:  Adolph Pollack, M.D.DATE OF BIRTH:  07/28/1933   DATE OF PROCEDURE:  01/16/2009  DATE OF DISCHARGE:                               OPERATIVE REPORT   PREOPERATIVE DIAGNOSIS:  Right axillary soft tissue mass.   POSTOPERATIVE DIAGNOSIS:  Right axillary soft tissue mass.   PROCEDURE:  Excision of right axillary soft tissue mass.   SURGEON:  Adolph Pollack, M.D.   ANESTHESIA:  General/LMA plus local (lidocaine Marcaine mixed).   INDICATIONS:  This is a 77 year old male who has noted a painful nodule  in the right axillary area.  It hurts him with specific movements and  has not resolved.  On examination he has a tender 2.5 to 3 cm  subcutaneous nodule in the right axilla that appears to be fairly deep  and now he presents for excision of this.   TECHNIQUE:  He was seen in the holding area and the right axilla marked  with my initials.  Of note was that he had shaved his axilla the night  before and had some rash type changes.  He was then brought to the  operating room, placed supine on the operating table and given general  anesthesia.  The right axillary area and chest wall and right upper arm  were sterilely prepped and draped.   The mass was palpated and a transverse incision made directly over it  through the skin and subcutaneous tissue.  Blunt dissection was used to  dissect down to the deep tissue and there was a soft tissue mass that  appeared to be consistent with lipoma lying directly on the muscle.  This was carefully dissected free from the muscle and excised and sent  to pathology.  A final measure was about 3 cm.  It was near a plexus of  veins but no bleeding was noted.   Following this I then anesthetized the superficial and deep tissue with  a local anesthetic.   I then closed the wound in two layers.  The  subcutaneous tissue was approximated with interrupted 3-0 Vicryl  sutures.  The skin was closed with a 4-0 Monocryl subcuticular stitch.  Steri-Strips and sterile dressings were applied.   He tolerated the procedure well without any apparent complications and  was taken to the recovery room in satisfactory condition.      Adolph Pollack, M.D.  Electronically Signed     TJR/MEDQ  D:  01/16/2009  T:  01/17/2009  Job:  [id number]

## 2011-04-26 NOTE — Op Note (Signed)
NAME:  Richard [name], Richard [name]                          ACCOUNT NO.:  [id number]   MEDICAL RECORD NO.:  [id number]                   PATIENT TYPE:  AMB   LOCATION:  DSC                                  FACILITY:  [facility]   PHYSICIAN:  Katy Fitch. Naaman Plummer., M.D.          DATE OF BIRTH:  19-Jul-1933   DATE OF PROCEDURE:  04/19/2004  DATE OF DISCHARGE:                                 OPERATIVE REPORT   PREOPERATIVE DIAGNOSIS:  Painful mass, dorsal aspect of the left wrist, with  clinical evidence of possible scapholunate ligament degenerative tear versus  intra-articular mass.   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  Also,  degenerative tear of central and peripheral triangle focal cartilage.   OPERATION:  1. Diagnostic arthroscopy, left wrist, identifying a myxomatous degeneration     of the dorsal scapholunate interosseus ligament connected with a mass,     probably a ganglion-type lesion.  2. Arthroscopic debridement of degenerative triangle focal cartilage tear     with ulnar-sided synovectomy.  3. Open resection of myxomatous mass and debridement of myxomatous     degeneration of scapholunate interosseus ligament, left wrist.   SURGEON:  Katy Fitch. Sypher, M.D.   ASSISTANT:  None.   ANESTHESIA:  General by LMA.   SUPERVISING ANESTHESIOLOGIST:  Dr. Katrinka Blazing.   INDICATIONS:  Richard [name] is a 77 year old gentleman referred for  evaluation and management of a painful left wrist.  Clinical examination  revealed a mass over the dorsal aspect of the wrist with weakness of grasp  and impairment of motion.   Clinical examination suggested a subretinacular mass consistent with a  ganglion; however, Watson's maneuver suggested an unstable and painful  scapholunate interosseus ligament, raising the question of a possible  myxomatous degeneration of the ligament.  Also, Richard [name] had some  generalized wrist symptoms that could represent synovitis and/or some type  of other internal derangement.   Given his more global symptoms not consistent with a typical dorsal  ganglion, we recommended diagnostic arthroscopy in an effort to full assess  his wrist pathology followed by open biopsy of his mass.   After informed consent, he is brought to the operating room at this time.   PROCEDURE:  Richard [name] is brought to the operating room and placed in a  supine position on the operating room table.  Ancef 1 gm was administered as  an IV prophylactic antibiotics.  Following induction of general anesthesia  by LMA, the left arm was prepped with Betadine soap solution and sterilely  draped.  A pneumatic tourniquet was applied in the proximal brachium.  Following exsanguination of the left arm with an esmarch bandage, the  arterial tourniquet was inflated to 220 mmHg.  The wrist was distracted, and  the tower designed for wrist arthroscopy with metal finger traps on  the  index and long fingers and counter-traction on the forearm.  Then 10 pounds  of distraction was applied.   The wrist was distended with 4 cc of sterile saline through a 3-4 dorsal  portal, taking care to avoid the mass.  The scope was introduced with blunt  technique.  Diagnostic arthroscopy revealed intact hyaline articular  cartilage surfaces on the scaphoid, lunate, and triquetrum as well as intact  volar and radiocarpal ligaments.  The scapholunate interosseus ligament was  inspected and found to be normal in its central and palmar surfaces;  however, its dorsal aspect was yellow, degenerative in appearance, and  mounded with an apparent connection of the capsule, consistent with the  ganglion or other myxomatous dorsal capsular lesion.   The triangle focal cartilage is well visualized.  There was a central  degenerative tear with extension  in a stellate manner.  A 6R portal was  created under direct vision with an 18 gauge needle followed by use of a  trocar and placement of a 2.9 mm suction shaver.  The triangle focal  cartilage was debrided through a stable margin with exposure of the ulnar  head followed by debridement of synovitis in the ulnar carpal joint.  The  ulnocarpal ligaments were intact.   The arthroscopic equipment was removed after photographic documentation of  the pathology.   The mass was then addressed by transverse incision through the 3-4 dorsal  portal.  Care was taken to preserve the extensor retinaculum.  The  retinaculum was retracted proximally followed by retraction of the extensor  tendon in the fourth dorsal compartment and an ulnar direction followed by  triple circumferential dissection around the ganglion-type mass/myxoma,  utilizing tenotomy scissors.   This had a sessile base strictly of the scapholunate ligament.  The mass was  excised with a portion of the scapholunate ligament.  The scapholunate  interosseous ligament was carefully inspected and found to be quite  degenerative, fibrillar, and necrotic in places.  This was thoroughly  debrided to a stable-appearing ligament.  This ultimately became a rather  extensive intra-articular debridement, debriding the entire dorsal portion  of the scapholunate interosseus ligament, leaving deep fibers intact.   The wound was then thoroughly lavaged with sterile saline.  The capsule was  not closed.  The tendons were placed in anatomic position, and the  retinaculum returned to an anatomic position.   The wound was repaired with intradermal 3-0 Prolene.  A compressive dressing  is applied with a volar plaster splint to maintain the wrist in 5 degrees of  dorsiflexion.   There were no apparent complications.  Richard [name] tolerated the surgery  and anesthesia well.  He was transferred to the recovery room with stable  vital signs.  He will  be discharged with prescriptions for Percocet 5 mg 1-2 tablets p.o.  q.4-6h. p.r.n. pain, 20 tablets without refill.  Also, Keflex 500 mg 1 p.o.  q.8h. x4 days as a prophylactic antibiotic.                                               Katy Fitch Naaman Plummer., M.D.    RVS/MEDQ  D:  04/19/2004  T:  04/20/2004  Job:  [id number]

## 2011-04-26 NOTE — Op Note (Signed)
NAMECorban Kistler, [name]                ACCOUNT NO.:  [id number]   MEDICAL RECORD NO.:  [id number]          PATIENT TYPE:  AMB   LOCATION:  DSC                          FACILITY:  [facility]   PHYSICIAN:  Katy Fitch. Sypher, M.D. DATE OF BIRTH:  May 11, 1933   DATE OF PROCEDURE:  06/13/2005  DATE OF DISCHARGE:                                 OPERATIVE REPORT   PREOPERATIVE DIAGNOSIS:  Chronic stenosing tenosynovitis, right thumb at A1  pulley.   POSTOPERATIVE DIAGNOSIS:  Chronic stenosing tenosynovitis, right thumb at A1  pulley.   OPERATION:  Release of right thumb A1 pulley.   OPERATING SURGEON:  Katy Fitch. Sypher, M.D.   ASSISTANT:  Molly Maduro Dasnoit PA-C.   ANESTHESIA:  Marcaine 0.25% and 2% lidocaine metacarpal head-level block and  flexor sheath block of right thumb with no supplemental sedation.  This the  minor operating room procedure.  The anesthesia was provided by Dr. Teressa Senter.   INDICATIONS:  [name] is a 77 year old right-hand dominant man who has  had a history of stenosing tenosynovitis affecting his right thumb.  He has  failed nonoperative measures including splinting, activity modification and  anti-inflammatory medication.  Due to a failure to respond to nonoperative  measures, he is brought to the operating room at this time for release of  his right thumb A1 pulley.   PROCEDURE:  Richard [name] was brought to the operating room and placed in  supine position on the operating table.   Following routine Betadine scrub and paint, the right arm was draped with  sterile stockinette and impervious arthroscopy drapes.   Marcaine 0.25% and 2% lidocaine were infiltrated at the metacarpal head  level to obtain a digital block.   When anesthesia was satisfactory, the procedure commenced with  exsanguination of the right arm with an Esmarch bandage and inflation of an  arterial tourniquet on [name] proximal brachium at 230 mmHg.   Procedure commenced with a short  transverse incision directly over the  palpably thickened A1 pulley.  The subcutaneous tissues were carefully divided, revealing the flexor  sheath.  The radial proper digital nerve was gently retracted.  The sheath  was noted to be markedly fibrotic and thickened.  The A1 pulley was split  with a scalpel and scissors.  The flexor pollicis longus had a significantly  swollen area proximal to the A1 pulley.  After release of the A1 pulley,  full IP range of motion was recovered and demonstrated as active range of  motion by Richard [name].   The wound was then repaired with a mattress suture of 5-0 nylon x2.  The  wound was dressed with Xeroflo, sterile gauze and an Ace wrap.   For aftercare Richard [name] is provided Darvocet-N 100 one p.o. q.4-6h.  p.r.n. pain, 20 tablets without refill.  He is advised to move his IP joint  frequently during the next week while awake and will return to our office  for follow-up in one week for dressing change and suture removal.   He was advised that he could remove the Ace bandage and apply a  Band-Aid  after four days.       RVS/MEDQ  D:  06/13/2005  T:  06/13/2005  Job:  [id number]

## 2011-04-26 NOTE — Discharge Summary (Signed)
NAMEDenman, Pichardo Fleet                ACCOUNT NO.:  [id number]   MEDICAL RECORD NO.:  [id number]          PATIENT TYPE:  INP   LOCATION:  1521                         FACILITY:  [facility]   PHYSICIAN:  Ollen Gross, M.D.    DATE OF BIRTH:  26-Jun-1933   DATE OF ADMISSION:  12/16/2006  DATE OF DISCHARGE:  12/22/2006                               DISCHARGE SUMMARY   ADMITTING DIAGNOSES:  1. Painful right hip hemiarthroplasty.  2. Hypertension.  3. Hemorrhoids.   DISCHARGE DIAGNOSES:  1. Painful right hip hemiarthroplasty, conversion revision from right      hip hemiarthroplasty over to right total hip arthroplasty.  2. Acute blood loss anemia.  3. Postoperative hyponatremia, improved.  4. Hypertension.  5. Hemorrhoids.   PROCEDURE:  On December 16, 2006, conversion of right hip hemiarthroplasty  over to right total hip arthroplasty.  Surgeon:  Ollen Gross, M.D.  Assistant:  Alexzandrew L. Perkins, P.A.-C.  Anesthesia:  General.   CONSULTS:  None.   BRIEF HISTORY:  Mr. Dilone is a 77 year old male with a unipolar right  hemiarthroplasty for a fracture several years ago.  He has had  progressive right hip pain.  Radiographs confirm loss of cartilage.  It  was felt that he would benefit from conversion.  He was subsequently  admitted to the hospital for conversion to a right total hip.   LABORATORY DATA:  CBC showed hemoglobin of 14.2, hematocrit 40.6, white  cell count 4.9.  Serial CBCs were followed.  Hemoglobin dropped down to  9.2 postop, then came back up to 9.8.  He did not require transfusion.  PT/PTT preop 13.6 and 35, respectively.  INR 1.  Serial pro times were  followed.  Last noted PT/INR 37.9 and 3.6.  Chem panel on admission all  within normal limits.  Serum BMETs were followed.  Sodium did drop from  138 to 129, back up to 136.  Remaining chem panel within normal limits.  The preop UA was negative.  Blood group type is O negative.   Chest x-ray, December 11, 2006, no  acute cardiopulmonary disease.  Chronic  changes.  Hip films preop, December 11, 2006:  Right hip hemiarthroplasty.  EKG, December 11, 2006:  Normal sinus rhythm, with sinus arrhythmia, left  axis deviation, incomplete right bundle branch block, abnormal.  No  significant changes from last tracing on Apr 17, 2004, confirmed by Dr.  Verdis Prime.   HOSPITAL COURSE:  The patient was subsequently admitted to the hospital  and underwent the above procedure without complication.  Later  transferred to the orthopedic floor.  He was started on PCA and p.o.  analgesic pain control following surgery and given 24 hours of postop IV  antibiotics.  Physical therapy was consulted postop.  This was for gait  training, ambulation, and ADLs.  Hemoglobin was 10.  He started getting  up out of bed.  Did have some pain.  Was weaned over from PCA over to  p.o. medications.  By day 2, he was doing a little bit better.  Hemoglobin was down to 9.2.  Sodium had  dropped to 129.  Fluids was  discontinued.  Dressing was changed.  He did have a little bit of bloody  drainage on the dressing.  However, the incision looked good.  From the  therapy standpoint, he started to progress and get up and start moving  around a little bit better with PT.  Ambulating better at 150-175 feet.  He was progressing well.  Unfortunately, due to social issues, he did  not have any help and was at home by himself.  Therefore, he was unable  to go home.  Discharge planning was consulted to assist with placement  of this patient.  He continued to receive therapy over the weekend and  eventually weaned over to p.o. medications, progressing well with  physical therapy.  Hemoglobin had dropped down to 9.2, but he did not  require transfusion.  It was coming back up.  By day 3, it was back up  to 9.8.  It became therapeutic.  His INR was up to 2.4.  By postop day  4, he was waiting for placement issues.  He continued to receive therapy  through  the weekend.  On Monday, December 22, 2006, we were waiting for  bed offers.  There was some possibility that a bed may open up.  Arrangements were being made, and it was felt that the patient would be  able to transfer at that time once a bed was available.   DISCHARGE PLAN:  1. Possible tentative date of discharge of December 22, 2006.  2. For discharge diagnoses, please see above.   DISCHARGE MEDICATIONS:  1. Coumadin per pharmacy protocol.  Please titrate the Coumadin level      for a target INR between 2-3.  2. Colace 100 mg p.o. b.i.d.  3. Hydrochlorothiazide 25 mg daily.  4. Depakote 500 mg daily.  5. Flomax 0.4 mg p.o. daily.  6. Avapro 150 mg daily.  7. Laxative of choice.  8. Enema of choice.  9. Percocet 5 mg 1 or 2 q.4-6 h. as needed for pain.  10.Tylenol 1 or 2 q.4-6 h. as needed for mild pain, temperature or      headache.  11.Reglan 10 p.o. q.8 h. p.r.n. nausea.  12.Robaxin 500 mg p.o. q.6-8 h. p.r.n. spasm.  13.Ambien 5 mg p.o. at bedtime p.r.n. sleep.  14.Zofran 4 mg p.o. q.6 h. p.r.n. nausea.   DIET:  As tolerated.   ACTIVITIES:  Partial weightbearing 25%-50% to the right lower extremity.  Continue gait training, ambulation, and ADLs as per PT and OT.  Needs to  be up out of bed a minimum of b.i.d.  May start showering.  Daily  dressing changes.  Hip precautions.  Total hip protocol.   FOLLOWUP:  Needs to follow up in the office with Dr. Lequita Halt by two  weeks from surgery.  Please contact the office at [phone] to arrange  appointment time and transfer over for further care.   DISPOSITION:  Pending at the time of dictation.  Awaiting bed offers.   CONDITION UPON DISCHARGE:  Improved.      Alexzandrew L. Julien Girt, P.A.      Ollen Gross, M.D.  Electronically Signed    ALP/MEDQ  D:  12/22/2006  T:  12/22/2006  Job:  [id number]   cc:   Jeanice Lim, M.D.   Maretta Bees. Vonita Moss, M.D.  Fax: [phone]

## 2011-04-26 NOTE — Op Note (Signed)
NAME:  DAQWAN, DOUGAL                          ACCOUNT NO.:  [id number]   MEDICAL RECORD NO.:  [id number]                   PATIENT TYPE:  OUT   LOCATION:  MRI                                  FACILITY:  [facility]   PHYSICIAN:  Stefani Dama, M.D.               DATE OF BIRTH:  10-06-1933   DATE OF PROCEDURE:  01/31/2004  DATE OF DISCHARGE:  01/24/2004                                 OPERATIVE REPORT   PREOPERATIVE DIAGNOSIS:  Herniated nucleus pulposus, L5-S1, right with  lumbar radiculopathy.   POSTOPERATIVE DIAGNOSIS:  Herniated nucleus pulposus, L5-S1, right with  lumbar radiculopathy.   OPERATION PERFORMED:  Right L5 hemilaminectomy via METRx microendoscopic  diskectomy, operating microscope, microdissection technique.   SURGEON:  Stefani Dama, M.D.   ASSISTANT:  Coletta Memos, M.D.   ANESTHESIA:  General endotracheal.   INDICATIONS FOR PROCEDURE:  The patient is a 77 year old individual who has  had significant back and right lower extremity pain.  He has evidence of a  disk herniation at the L5-S1 level on the right side with compression and  elevation of the right S1 nerve root.  The patient also had significant  central herniation at L4-L5 which caused some elevation and compression of  the left-sided nerve root.  He was taken to the operating room to undergo  surgical decompression.   DESCRIPTION OF PROCEDURE:  The patient was brought to the operating room  supine on a stretcher.  After a smooth induction of general endotracheal  anesthesia, he was turned prone.  The back was shaved, prepped with  Hibiclens, then alcohol and then draped sterilely.  Fluoroscopic guidance  was used to localize the L5 hemilamina on that right side.  A K-wire was  passed to the laminar arch after infiltrating the skin with local  anesthetic. A small vertical incision was created over this region and  dissection was then performed by using a wanding technique and a series of  dilators  over the K-wire to open this area up to an 18 mm diameter.  An 18  mm x 6 cm endoscopic cannula was then affixed to the operating table and  placed over the L5 laminar arch.  Hemilaminectomy of L5 was then performed  removing the interlaminar ligament and exposing the common dural tube at the  take off of the S1 nerve root.  At L5-S1 there was noted to be some  fragments of disk underneath the S1 nerve root.  These were removed and  decompressed and allowed for good decompression of the S1 nerve root itself.  Medially, several other fragments were encountered.  However, the disk space  itself was noted to be quite closed down and it was not therefore opened or  explored further.  Hemostasis in this area was achieved.  The L4-5  interspace was noted to have a significant prominence centrally and with  careful dissection cephalad, the common  dural tube was retracted medially  and the inferior portion of this mass was encountered.  Then by dissecting  through it, several fragments of disk were removed from within this allowed  for good decompression of the common dural tube centrally and presumably off  to the left side.  Because the patient had no left-sided radiculopathy,  further exploration at that level was not performed.  With this then,  hemostasis was obtained in the epidural tissues and the soft tissues with  some pledgets of Gelfoam soaked in thrombin which were later irrigated away.  The area was inspected carefully both cephalad and inferiorly.  It should be  noted that the entire procedure was done with the help of Dr. Franky Macho who  provided retraction and also performed a portion of the diskectomy removing  fragments of disk from within the endoscopic cannula.  In the end,  hemostasis was achieved.  Approximately 30 mg of Depo-Medrol was left in the  epidural space along with 0.5 mL of fentanyl.  The microscope was removed  from the field.  A single fascial stitch was placed in the  lumbodorsal  fascia and the subcutaneous tissue was closed with 3-0 Vicryl in interrupted  fashion.  3-0 Vicryl was used to closed the subcuticular skin.  Dermabond  was placed on the skin.  The patient tolerated the procedure well and was  transferred to the recovery room in stable condition.                                               Stefani Dama, M.D.    Merla Riches  D:  01/31/2004  T:  02/01/2004  Job:  [id number]

## 2011-04-26 NOTE — H&P (Signed)
NAMERaymel, Richard [name]                ACCOUNT NO.:  [id number]   MEDICAL RECORD NO.:  [id number]          PATIENT TYPE:  INP   LOCATION:  NA                           FACILITY:  [facility]   PHYSICIAN:  Ollen Gross, M.D.    DATE OF BIRTH:  06-11-1933   DATE OF ADMISSION:  12/16/2006  DATE OF DISCHARGE:                              HISTORY & PHYSICAL   CHIEF COMPLAINT:  Right hip pain following previous hip surgery.   HISTORY OF PRESENT ILLNESS:  The patient is a 77 year old male who has  been seen by Dr. Lequita Halt for approximately a 4 month history of  increasing right hip pain.  History is significant to fall back in 2001  which he sustained a femoral neck fracture requiring a cemented Unipolar  hemiarthroplasty back in [place], [place] by Dr. Marita Kansas.  Did  not have much problems until the past 4-5 months, started having  increasing pain.  He has been seen by Dr. Annette Stable Ward at [facility], told him  he needed to be converted over to a total hip.  He was seen by Dr.  Lequita Halt in second opinion and agrees with the initial assessment.  X-  rays show he has a well fixed femoral stem of the unipolar  hemiarthroplasty with the acetabular cartilage completely eroded.  Has  significant sclerosis of the acetabular bone in the weight bearing  portion.  It was felt he would benefit undergoing conversion over to a  total hip.  Risks and benefits have been discussed, and he has elected  to proceed with surgery.   ALLERGIES:  No known drug allergies.   MEDICATIONS:  1. Hydrocort.  2. Benicar.  3. Depakote.  4. Flomax.  5. Mature multivitamin.  6. B12.  7. Saw Palmetto.  8. Ibuprofen.   PAST MEDICAL HISTORY:  1. Hypertension.  2. Hemorrhoids.   PAST SURGICAL HISTORY:  1. Right trigger thumb surgery.  2. Left wrist ganglion excision.  3. Right hip hemiarthroplasty secondary to fracture 2001.  4. Trigeminal nerve surgery 2003.  5. Back surgery per Dr. Danielle Dess in 2005.   SOCIAL HISTORY:   Single, retired, nonsmoker.  One drink a week.  Two  children.  His daughter lives out of town, is the closest relative, but  he has no one at home to help him.   FAMILY HISTORY:  Father with a history of heart disease.  Mother with a  history of melanoma, cancer.  Sister with a history of Parkinson's.   REVIEW OF SYSTEMS:  GENERAL:  No fever, chills, night sweats.  NEUROLOGICAL:  No seizures, syncope or paralysis.  RESPIRATORY:  No  shortness of breath, productive cough or hemoptysis.  CARDIOVASCULAR:  Chest pain and angina or orthopnea.  GI:  No nausea, vomiting, diarrhea  or constipation.  GU:  No dysuria, hematuria.  Does have a little bit of  frequency and nocturia.  MUSCULOSKELETAL:  Right hip.   PHYSICAL EXAMINATION:  VITAL SIGNS:  Pulse 76, respirations 12, blood  pressure 148/82.  GENERAL:  A 77 year old white male, well-nourished, well-developed, no  acute distress.  He  is alert, oriented and cooperative.  Somewhat  nervous and anxious at the time of his exam.  He is a good historian  though.  HEENT:  Normocephalic, atraumatic.  Pupils round and reactive.  EOM's  intact.  NECK:  Supple.  No bruits.  CHEST:  Clear anterior and posterior chest wall.  HEART:  Regular rate and rhythm.  No murmur.  ABDOMEN:  Soft, slightly round.  Bowel sounds present.  __________.  EXTREMITIES:  Right hip just flexed with a 100 internal rotation and  external rotation of 28.  Abduction of 30 degrees.  He does ambulate and  an antalgic gait secondary to his hip pain.   IMPRESSION:  1. Painful right hip hemiarthroplasty.  2. Hypertension.  3. Hemorrhoids.   PLAN:  The patient admitted to [facility] to undergo  conversion of a right hip hemiarthroplasty over to a right total hip.  Surgery will be performed by Dr. Ollen Gross.      Alexzandrew L. Julien Girt, P.A.      Ollen Gross, M.D.  Electronically Signed    ALP/MEDQ  D:  12/14/2006  T:  12/15/2006  Job:  [id number]    cc:   Ollen Gross, M.D.  Fax: [phone]   Sloan M.D. Arrie Aran J. Vonita Moss, M.D.  Fax: [phone]

## 2011-04-26 NOTE — Discharge Summary (Signed)
NAMEAndrzej, Richard [name]                ACCOUNT NO.:  [id number]   MEDICAL RECORD NO.:  [id number]          PATIENT TYPE:  INP   LOCATION:  1521                         [facility]ACILITY:  [facility]   PHYSICIAN:  Ollen Gross, M.D.    DATE OF BIRTH:  1933/06/11   DATE OF ADMISSION:  12/16/2006  DATE OF DISCHARGE:  12/23/2006                               DISCHARGE SUMMARY   ADDENDUM:   ADMISSION AND DISCHARGE DIAGNOSES:  Please see the previous discharge  summary.   ADDENDUM:  The patient is a 77 year old male who underwent the above  procedure.  He has been in the hospital since December 16, 2006.  As per  the previous summary, there was a possible discharged on January 14;  however, a bed did not become available and through discharge planning,  we had obtained information concerning bed availability.  A bed would be  available at [facility] on December 23, 2006.  The patient was  progressing, ready for transfer, and will be discharged on December 23, 2006.   DISCHARGE PLAN:  As per the previous summary.  Only change is the  following:   FOLLOW-UP:  The patient will need to follow up in the office on Friday,  December 26, 2006.  Please contact the office at [phone] to arrange an  appointment and time for the patient.  Will follow up in the office.      Alexzandrew L. Julien Girt, P.A.      Ollen Gross, M.D.  Electronically Signed    ALP/MEDQ  D:  12/23/2006  T:  12/23/2006  Job:  [id number]   cc:   Jeanice Lim, M.D.   Maretta Bees. Vonita Moss, M.D.  Fax: [phone]   [facility]

## 2011-04-26 NOTE — Op Note (Signed)
NAMELorenzo, Richard [name]                ACCOUNT NO.:  [id number]   MEDICAL RECORD NO.:  [id number]          PATIENT TYPE:  INP   LOCATION:  E454                         FACILITY:  [facility]   PHYSICIAN:  Ollen Gross, M.D.    DATE OF BIRTH:  August 25, 1933   DATE OF PROCEDURE:  12/16/2006  DATE OF DISCHARGE:                               OPERATIVE REPORT   PREOP DIAGNOSIS:  Painful right hip hemiarthroplasty.   POSTOP DIAGNOSIS:  Painful right hip hemiarthroplasty.   PROCEDURE:  Conversion of right hip hemiarthroplasty to total hip  arthroplasty.   SURGEON:  Ollen Gross, M.D.   ASSISTANT:  Alexzandrew L. Julien Girt, P.A.   ANESTHESIA:  General.   ESTIMATED BLOOD LOSS:  250 mL   DRAIN:  Hemovac x1.   COMPLICATIONS:  None.   CONDITION:  Stable to the recovery.   CLINICAL NOTE:  Richard [name] is a 77 year old male who had a unipolar  right hip by hemiarthroplasty done for a fracture several years ago.  He  has had progressive right groin pain.  Radiographs confirm that he has  lost all of his acetabular cartilage.  He presents, now, for conversion  of the hemiarthroplasty to a total hip arthroplasty.   PROCEDURE IN DETAIL:  After successful administration of general  anesthetic, the patient was placed in the left lateral decubitus  position with the right side up and held with the hip positioner.  The  right lower extremity was isolated from his perineum with plastic drapes  and prepped and draped in the usual sterile fashion.  I used the distal  part of his old incision and proximally, I went to a posterolateral-type  incision.  Skin was cut with 10-blade through the subcutaneous tissue to  the level of fascia lata which is incised in line with the skin  incision.  Sciatic nerve is palpated and protected.  I removed the  pseudocapsule, but he had a large amount heterotopic bone in the joint.  I excised the heterotopic bone back to normal bony landmarks.  I was  then able to dislocate  the hip.  He had a unipolar hemiarthroplasty; and  I removed the unipolar head.  The cemented stem was found to be stable  to rotational and axial forces; and I subsequently retracted the entire  femur anteriorly to gain exposure to the acetabulum.  All of the  acetabular cartilage was gone.  It was exposed bone.  I removed the rest  of the osteophytes after placement of the acetabular retractors.  We  then began reaming at 45 mm coursing in increments of 2 up to 55 mm.  A  56-mm pinnacle acetabular shell was placed in anatomic position and  transfixed with two dome screws.  The trial 36 mm neutral +4 liner was  placed.  We then placed the trial 36 +0 head.  There was too much soft  tissue laxity; and we went to a 36 +8.5 which left excellent soft tissue  tension.  He had great stability with full extension, full external  rotation, 70 degrees flexion, 40 degrees adduction, 90 degrees  internal  rotation, then 90 degrees of flexion and 70 degrees of internal  rotation.  By placing the right leg on top of the left; it felt as  though the leg lengths were equal.   The hip was then dislocated and the trial was removed.  The permanent  apex hole eliminator and the permanent 36 mm neutral +4 marathon liner  were placed into the acetabular shell.  On the femoral side we placed a  36 +8.5 head.  The hip was reduced to the same stability parameters.  The wound was copiously irrigated with saline solution; and the short  rotators are reattached to the femur through drill holes.  Fascia lata  was closed over Hemovac drain with interrupted #1 Vicryl, subcu closed  with #1 and 2-0 Vicryl, and the skin closed with staples.  The Hemovac  is hooked to suction; incision cleaned and dried; and a bulky sterile  dressing applied.  He was then placed into a knee immobilizer, awakened,  and transported to recovery in stable condition.      Ollen Gross, M.D.  Electronically Signed     FA/MEDQ  D:   12/16/2006  T:  12/16/2006  Job:  [id number]

## 2011-06-03 ENCOUNTER — Other Ambulatory Visit: Payer: Self-pay | Admitting: Oncology

## 2011-06-03 ENCOUNTER — Encounter (HOSPITAL_BASED_OUTPATIENT_CLINIC_OR_DEPARTMENT_OTHER): Payer: Medicare Other | Admitting: Oncology

## 2011-06-03 DIAGNOSIS — D6959 Other secondary thrombocytopenia: Secondary | ICD-10-CM

## 2011-06-03 DIAGNOSIS — D801 Nonfamilial hypogammaglobulinemia: Secondary | ICD-10-CM

## 2011-06-03 DIAGNOSIS — C9000 Multiple myeloma not having achieved remission: Secondary | ICD-10-CM

## 2011-06-03 DIAGNOSIS — M549 Dorsalgia, unspecified: Secondary | ICD-10-CM

## 2011-06-03 LAB — CBC WITH DIFFERENTIAL/PLATELET
BASO%: 0.2 % (ref 0.0–2.0)
Basophils Absolute: 0 10*3/uL (ref 0.0–0.1)
EOS%: 1.2 % (ref 0.0–7.0)
MCH: 32.5 pg (ref 27.2–33.4)
MCHC: 35.5 g/dL (ref 32.0–36.0)
MCV: 91.7 fL (ref 79.3–98.0)
MONO%: 10.7 % (ref 0.0–14.0)
RBC: 4.4 10*6/uL (ref 4.20–5.82)
RDW: 14 % (ref 11.0–14.6)
lymph#: 1.1 10*3/uL (ref 0.9–3.3)

## 2011-06-04 LAB — COMPREHENSIVE METABOLIC PANEL
ALT: 21 U/L (ref 0–53)
Albumin: 4.4 g/dL (ref 3.5–5.2)
Alkaline Phosphatase: 48 U/L (ref 39–117)
Glucose, Bld: 92 mg/dL (ref 70–99)
Potassium: 4.1 mEq/L (ref 3.5–5.3)
Sodium: 138 mEq/L (ref 135–145)
Total Bilirubin: 0.8 mg/dL (ref 0.3–1.2)
Total Protein: 6.7 g/dL (ref 6.0–8.3)

## 2011-06-04 LAB — KAPPA/LAMBDA LIGHT CHAINS: Lambda Free Lght Chn: 2.06 mg/dL (ref 0.57–2.63)

## 2011-07-11 ENCOUNTER — Other Ambulatory Visit: Payer: Self-pay | Admitting: Oncology

## 2011-07-11 ENCOUNTER — Encounter (HOSPITAL_BASED_OUTPATIENT_CLINIC_OR_DEPARTMENT_OTHER): Payer: Medicare Other | Admitting: Oncology

## 2011-07-11 DIAGNOSIS — C9000 Multiple myeloma not having achieved remission: Secondary | ICD-10-CM

## 2011-07-11 DIAGNOSIS — D801 Nonfamilial hypogammaglobulinemia: Secondary | ICD-10-CM

## 2011-07-11 DIAGNOSIS — D61818 Other pancytopenia: Secondary | ICD-10-CM

## 2011-07-11 DIAGNOSIS — D6959 Other secondary thrombocytopenia: Secondary | ICD-10-CM

## 2011-07-11 DIAGNOSIS — M549 Dorsalgia, unspecified: Secondary | ICD-10-CM

## 2011-07-11 LAB — CBC WITH DIFFERENTIAL/PLATELET
Basophils Absolute: 0 10*3/uL (ref 0.0–0.1)
EOS%: 1.3 % (ref 0.0–7.0)
Eosinophils Absolute: 0.1 10*3/uL (ref 0.0–0.5)
HCT: 41.1 % (ref 38.4–49.9)
HGB: 14.5 g/dL (ref 13.0–17.1)
LYMPH%: 26.2 % (ref 14.0–49.0)
MCH: 32.8 pg (ref 27.2–33.4)
MCV: 93.1 fL (ref 79.3–98.0)
MONO%: 9.3 % (ref 0.0–14.0)
NEUT#: 3.6 10*3/uL (ref 1.5–6.5)
NEUT%: 62.9 % (ref 39.0–75.0)
Platelets: 112 10*3/uL — ABNORMAL LOW (ref 140–400)

## 2011-07-11 LAB — BASIC METABOLIC PANEL
BUN: 20 mg/dL (ref 6–23)
Creatinine, Ser: 1 mg/dL (ref 0.50–1.35)
Glucose, Bld: 102 mg/dL — ABNORMAL HIGH (ref 70–99)
Potassium: 3.7 mEq/L (ref 3.5–5.3)

## 2011-07-12 LAB — KAPPA/LAMBDA LIGHT CHAINS
Kappa:Lambda Ratio: 0.59 (ref 0.26–1.65)
Lambda Free Lght Chn: 2.09 mg/dL (ref 0.57–2.63)

## 2011-07-23 ENCOUNTER — Ambulatory Visit
Admission: RE | Admit: 2011-07-23 | Discharge: 2011-07-23 | Disposition: A | Discharge status: Home / Self Care | Payer: Medicare Other | Source: Ambulatory Visit | Attending: Radiation Oncology | Admitting: Radiation Oncology

## 2011-09-05 ENCOUNTER — Other Ambulatory Visit: Payer: Self-pay | Admitting: Dermatology

## 2011-10-01 ENCOUNTER — Other Ambulatory Visit: Payer: Self-pay | Admitting: Oncology

## 2011-10-01 ENCOUNTER — Encounter (HOSPITAL_BASED_OUTPATIENT_CLINIC_OR_DEPARTMENT_OTHER): Payer: Medicare Other | Admitting: Oncology

## 2011-10-01 DIAGNOSIS — Z23 Encounter for immunization: Secondary | ICD-10-CM

## 2011-10-01 DIAGNOSIS — D6959 Other secondary thrombocytopenia: Secondary | ICD-10-CM

## 2011-10-01 DIAGNOSIS — C9000 Multiple myeloma not having achieved remission: Secondary | ICD-10-CM

## 2011-10-01 DIAGNOSIS — D61818 Other pancytopenia: Secondary | ICD-10-CM

## 2011-10-01 DIAGNOSIS — D696 Thrombocytopenia, unspecified: Secondary | ICD-10-CM

## 2011-10-01 LAB — CBC WITH DIFFERENTIAL/PLATELET
BASO%: 0.4 % (ref 0.0–2.0)
EOS%: 2 % (ref 0.0–7.0)
LYMPH%: 17.9 % (ref 14.0–49.0)
MCH: 32.7 pg (ref 27.2–33.4)
MCHC: 35.1 g/dL (ref 32.0–36.0)
MONO#: 0.6 10*3/uL (ref 0.1–0.9)
Platelets: 118 10*3/uL — ABNORMAL LOW (ref 140–400)
RBC: 4.42 10*6/uL (ref 4.20–5.82)
WBC: 6.4 10*3/uL (ref 4.0–10.3)
lymph#: 1.1 10*3/uL (ref 0.9–3.3)

## 2011-10-01 LAB — BASIC METABOLIC PANEL
CO2: 29 mEq/L (ref 19–32)
Calcium: 10.1 mg/dL (ref 8.4–10.5)
Sodium: 133 mEq/L — ABNORMAL LOW (ref 135–145)

## 2011-10-06 ENCOUNTER — Telehealth: Payer: Self-pay | Admitting: Oncology

## 2011-10-06 NOTE — Telephone Encounter (Signed)
PT TO BE CONTACTED RE APPTS FOR JAN 2013 - LBFU/ZOMETA PER 10/23 POF. bs TEMPLATE NOT AVAILABLE. PT REQUESTS Tuesday MORNINGS.

## 2011-10-09 ENCOUNTER — Other Ambulatory Visit: Payer: Self-pay | Admitting: Cardiology

## 2011-10-10 ENCOUNTER — Other Ambulatory Visit: Payer: Self-pay | Admitting: Dermatology

## 2011-10-17 ENCOUNTER — Telehealth: Payer: Self-pay | Admitting: Oncology

## 2011-10-17 NOTE — Telephone Encounter (Signed)
S/w pt's dtr re appt for 1/8 @ 9 am.

## 2011-10-18 ENCOUNTER — Ambulatory Visit (INDEPENDENT_AMBULATORY_CARE_PROVIDER_SITE_OTHER): Payer: Medicare Other | Admitting: Cardiology

## 2011-10-18 ENCOUNTER — Encounter: Payer: Self-pay | Admitting: Cardiology

## 2011-10-18 DIAGNOSIS — I4891 Unspecified atrial fibrillation: Secondary | ICD-10-CM

## 2011-10-18 DIAGNOSIS — I714 Abdominal aortic aneurysm, without rupture: Secondary | ICD-10-CM

## 2011-10-18 DIAGNOSIS — I10 Essential (primary) hypertension: Secondary | ICD-10-CM

## 2011-10-18 DIAGNOSIS — I2789 Other specified pulmonary heart diseases: Secondary | ICD-10-CM

## 2011-10-18 NOTE — Assessment & Plan Note (Signed)
He has had this documented before in his chart. However, I see no recent ultrasound. I will followup with an abdominal aortic ultrasound

## 2011-10-18 NOTE — Assessment & Plan Note (Signed)
The blood pressure is at target. No change in medications is indicated. We will continue with therapeutic lifestyle changes (TLC).  

## 2011-10-18 NOTE — Assessment & Plan Note (Signed)
He had moderate pulmonary pressure elevation on echocardiography last year. I will repeat this. He has no symptoms related to this.

## 2011-10-18 NOTE — Progress Notes (Signed)
HPI The patient presents for follow up of atrial fibrillation. He had this documented in the past and was on Pradaxa for a short time.  However, he has had no recurrence of this.  He was taken off Pradaxa earlier this year.  He denies any palpitations, presyncope or syncope. He is active exercising routinely. With this he denies any shortness of breath, neck discomfort, arm discomfort or chest pain. He has no PND or orthopnea. He's had no weight gain or lower extremity swelling.  Allergies  Allergen Reactions  . Celebrex (Celecoxib)     Current Outpatient Prescriptions  Medication Sig Dispense Refill  . aspirin 81 MG tablet Take 81 mg by mouth daily.        Marland Kitchen BENICAR HCT 20-12.5 MG per tablet TAKE ONE TABLET BY MOUTH EVERY DAY  30 each  6  . esomeprazole (NEXIUM) 40 MG capsule Take 40 mg by mouth as needed.       . Multiple Vitamin (MULTIVITAMIN) capsule Take 1 capsule by mouth daily.        . saw palmetto 160 MG capsule Take 160 mg by mouth 2 (two) times daily.        . Tamsulosin HCl (FLOMAX) 0.4 MG CAPS Take by mouth.        . vitamin B-12 (CYANOCOBALAMIN) 100 MCG tablet Take 100 mcg by mouth daily.          Past Medical History  Diagnosis Date  . Multiple myeloma   . CAD (coronary artery disease)   . Hypertension   . Dyslipidemia   . Arrhythmia     atr ial fibrillation  . Cholelithiasis   . Aortic aneurysm   . GERD (gastroesophageal reflux disease)   . Hiatal hernia   . Esophageal stricture   . Anemia   . History of BPH     Past Surgical History  Procedure Date  . Hip surgery   . Back surgery   . Trigeminal neuralgia   . Appendectomy   . Tonsillectomy   . Right chest wall resection     of second ,third,and fourth rib    ROS:  As stated in the HPI and negative for all other systems.  PHYSICAL EXAM BP 126/70  Pulse 79  Ht 5\' 8"  (1.727 m)  Wt 183 lb (83.008 kg)  BMI 27.82 kg/m2 GENERAL:  Well appearing HEENT:  Pupils equal round and reactive, fundi not  visualized, oral mucosa unremarkable NECK:  No jugular venous distention, waveform within normal limits, carotid upstroke brisk and symmetric, no bruits, no thyromegaly LYMPHATICS:  No cervical, inguinal adenopathy LUNGS:  Clear to auscultation bilaterally BACK:  No CVA tenderness CHEST:  Large right posterior thoracotomy scar HEART:  PMI not displaced or sustained,S1 and S2 within normal limits, no S3, no S4, no clicks, no rubs, no murmurs ABD:  Flat, positive bowel sounds normal in frequency in pitch, no bruits, no rebound, no guarding, no midline pulsatile mass, no hepatomegaly, no splenomegaly EXT:  2 plus pulses throughout, no edema, no cyanosis no clubbing SKIN:  No rashes no nodules NEURO:  Cranial nerves II through XII grossly intact, motor grossly intact throughout [facility]:  Cognitively intact, oriented to person place and time  EKG;  Sinus rhythm, rate 78, left axis deviation, early transition, incomplete right bundle branch block, no acute ST-T wave , PACs 10/18/2011  ASSESSMENT AND PLAN

## 2011-10-18 NOTE — Assessment & Plan Note (Signed)
He has had PAF but this was at the time of acute illness.  He has had no recurrence of this.  No change in therapy is indicated.

## 2011-10-18 NOTE — Patient Instructions (Signed)
Your physician has requested that you have an abdominal aorta duplex. During this test, an ultrasound is used to evaluate the aorta. Allow 30 minutes for this exam. Do not eat after midnight the day before and avoid carbonated beverages  Your physician has requested that you have an echocardiogram. Echocardiography is a painless test that uses sound waves to create images of your heart. It provides your doctor with information about the size and shape of your heart and how well your heart's chambers and valves are working. This procedure takes approximately one hour. There are no restrictions for this procedure.  The current medical regimen is effective;  continue present plan and medications.  Follow up in 1 year with Dr Antoine Poche.  You will receive a letter in the mail 2 months before you are due.  Please call us when you receive this letter to schedule your follow up appointment.

## 2011-11-21 ENCOUNTER — Ambulatory Visit (HOSPITAL_COMMUNITY): Payer: Medicare Other | Attending: Cardiovascular Disease | Admitting: Radiology

## 2011-11-21 ENCOUNTER — Encounter (INDEPENDENT_AMBULATORY_CARE_PROVIDER_SITE_OTHER): Payer: Medicare Other | Admitting: Cardiology

## 2011-11-21 DIAGNOSIS — I4891 Unspecified atrial fibrillation: Secondary | ICD-10-CM | POA: Insufficient documentation

## 2011-11-21 DIAGNOSIS — I2789 Other specified pulmonary heart diseases: Secondary | ICD-10-CM

## 2011-11-21 DIAGNOSIS — I714 Abdominal aortic aneurysm, without rupture: Secondary | ICD-10-CM

## 2011-11-21 DIAGNOSIS — I700 Atherosclerosis of aorta: Secondary | ICD-10-CM

## 2011-11-21 DIAGNOSIS — I079 Rheumatic tricuspid valve disease, unspecified: Secondary | ICD-10-CM | POA: Insufficient documentation

## 2011-11-21 DIAGNOSIS — I2510 Atherosclerotic heart disease of native coronary artery without angina pectoris: Secondary | ICD-10-CM | POA: Insufficient documentation

## 2011-11-21 DIAGNOSIS — I270 Primary pulmonary hypertension: Secondary | ICD-10-CM | POA: Insufficient documentation

## 2011-11-21 DIAGNOSIS — R011 Cardiac murmur, unspecified: Secondary | ICD-10-CM

## 2011-11-21 DIAGNOSIS — I379 Nonrheumatic pulmonary valve disorder, unspecified: Secondary | ICD-10-CM | POA: Insufficient documentation

## 2011-11-21 DIAGNOSIS — I059 Rheumatic mitral valve disease, unspecified: Secondary | ICD-10-CM | POA: Insufficient documentation

## 2011-12-17 ENCOUNTER — Other Ambulatory Visit: Payer: Medicare Other | Admitting: Lab

## 2011-12-17 ENCOUNTER — Other Ambulatory Visit: Payer: Self-pay | Admitting: Oncology

## 2011-12-17 ENCOUNTER — Ambulatory Visit (HOSPITAL_BASED_OUTPATIENT_CLINIC_OR_DEPARTMENT_OTHER): Payer: Medicare Other | Admitting: Oncology

## 2011-12-17 ENCOUNTER — Telehealth: Payer: Self-pay | Admitting: Oncology

## 2011-12-17 DIAGNOSIS — C888 Other malignant immunoproliferative diseases not having achieved remission: Secondary | ICD-10-CM

## 2011-12-17 DIAGNOSIS — D801 Nonfamilial hypogammaglobulinemia: Secondary | ICD-10-CM

## 2011-12-17 DIAGNOSIS — D61818 Other pancytopenia: Secondary | ICD-10-CM

## 2011-12-17 DIAGNOSIS — C9000 Multiple myeloma not having achieved remission: Secondary | ICD-10-CM

## 2011-12-17 LAB — BASIC METABOLIC PANEL
BUN: 16 mg/dL (ref 6–23)
CO2: 29 mEq/L (ref 19–32)
Chloride: 99 mEq/L (ref 96–112)
Glucose, Bld: 97 mg/dL (ref 70–99)
Potassium: 3.6 mEq/L (ref 3.5–5.3)

## 2011-12-17 LAB — CBC WITH DIFFERENTIAL/PLATELET
Basophils Absolute: 0 10*3/uL (ref 0.0–0.1)
Eosinophils Absolute: 0.1 10*3/uL (ref 0.0–0.5)
HGB: 14.8 g/dL (ref 13.0–17.1)
LYMPH%: 26.1 % (ref 14.0–49.0)
MCV: 92.7 fL (ref 79.3–98.0)
MONO%: 8.7 % (ref 0.0–14.0)
NEUT#: 3.1 10*3/uL (ref 1.5–6.5)
Platelets: 114 10*3/uL — ABNORMAL LOW (ref 140–400)
RDW: 14.3 % (ref 11.0–14.6)

## 2011-12-17 NOTE — Progress Notes (Signed)
OFFICE PROGRESS NOTE   INTERVAL HISTORY:   He returns as scheduled. He feels well. He denies pain. He fell a few weeks ago and developed a bruise at the right lateral chest wall.  Objective:  Vital signs in last 24 hours:  Blood pressure 163/87, pulse 71, temperature 97.6 F (36.4 C), temperature source Oral, height 5\' 8"  (1.727 m), weight 187 lb (84.823 kg).    Lymphatics: No axillary lymph node Resp: Lungs clear bilaterally Cardio: Regular rate and rhythm GI: No hepatosplenomegaly Vascular: No leg edema  Musculoskeletal: Resolving ecchymosis at the right lateral chest wall    Lab Results:  Lab Results  Component Value Date   WBC 4.9 12/17/2011   HGB 14.8 12/17/2011   HCT 42.1 12/17/2011   MCV 92.7 12/17/2011   PLT 114* 12/17/2011      Medications: I have reviewed the patient's current medications.  Assessment/Plan: 1. Multiple myeloma:  The serum free light chains were elevated 08/29/2009.  A CT of the chest 08/31/2009 showed a new pleural mass consistent with progression of multiple myeloma.  He completed 12 cycles of Revlimid/Decadron.  The serum free light chains were improved to 4.34 on 12/29/2009.  A restaging CT of the chest 02/19/2010 showed resolution of right axillary lymphadenopathy and a pleural-based mass.  The serum free light chains were normal on 10/01/2011 2. History of back pain, likely related to the pleural-based mass at the right chest, resolved. 3. Right axillary/subpectoral lymphadenopathy on a CT of the chest 08/31/2009, likely related to multiple myeloma, resolved. 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 5. Pancytopenia secondary to Revlimid and multiple myeloma.  He has persistent mild thrombocytopenia. 6. Hypertension. 7. Status post hip replacement. 8. History of back surgery. 9. History of trigeminal neuralgia. 10. Status post removal of a lipoma from the right axilla in February 2010. 11. 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 12. Esophageal reflux disease, followed by Dr. Jearld Fenton.   13. Esophageal stricture, status post an evaluation by Dr. Marina Goodell. 14. Right vocal cord lesion, status post a biopsy by Dr. 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  He completed radiation under the direction of Dr. Dayton Scrape on 03/15/2011. 15. Admission with pneumonia 03/26/2010. 16. Atrial flutter/fibrillation while hospitalized August 2011.  He is followed by Dr. Antoine Poche. 17. Elevated prostate specific antigen, status post a biopsy 01/23/2011.  He was found to have a Gleason 6 cancer in 10% of the cores.  He is followed with an observation approach.  He is now followed by Dr. Retta Diones.     Disposition:  He appears stable. There is no clinical evidence for progression of the multiple myeloma. We obtained serum light chain levels today. He will return for Zometa on January 15. He is scheduled for an office visit and Zometa on April 16.   Lucile Shutters, MD  12/17/2011  10:30 AM

## 2011-12-17 NOTE — Telephone Encounter (Signed)
gv pt relative appt schedule for jan/april.

## 2011-12-18 LAB — KAPPA/LAMBDA LIGHT CHAINS: Kappa:Lambda Ratio: 0.72 (ref 0.26–1.65)

## 2011-12-23 ENCOUNTER — Other Ambulatory Visit: Payer: Self-pay | Admitting: Oncology

## 2011-12-23 DIAGNOSIS — C9000 Multiple myeloma not having achieved remission: Secondary | ICD-10-CM

## 2011-12-24 ENCOUNTER — Ambulatory Visit (HOSPITAL_BASED_OUTPATIENT_CLINIC_OR_DEPARTMENT_OTHER): Payer: Medicare Other

## 2011-12-24 ENCOUNTER — Ambulatory Visit
Admission: RE | Admit: 2011-12-24 | Discharge: 2011-12-24 | Disposition: A | Discharge status: Home / Self Care | Payer: Medicare Other | Source: Ambulatory Visit | Attending: Radiation Oncology | Admitting: Radiation Oncology

## 2011-12-24 ENCOUNTER — Encounter: Payer: Self-pay | Admitting: Radiation Oncology

## 2011-12-24 DIAGNOSIS — C320 Malignant neoplasm of glottis: Secondary | ICD-10-CM

## 2011-12-24 DIAGNOSIS — C61 Malignant neoplasm of prostate: Secondary | ICD-10-CM

## 2011-12-24 DIAGNOSIS — C9000 Multiple myeloma not having achieved remission: Secondary | ICD-10-CM

## 2011-12-24 MED ORDER — SODIUM CHLORIDE 0.9 % IJ SOLN
3.0000 mL | Freq: Once | INTRAMUSCULAR | Status: DC | PRN
  Filled 2011-12-24: qty 10

## 2011-12-24 MED ORDER — ZOLEDRONIC ACID 4 MG/100ML IV SOLN
4.0000 mg | Freq: Once | INTRAVENOUS | Status: AC
  Administered 2011-12-24: 4 mg via INTRAVENOUS
  Filled 2011-12-24: qty 100

## 2011-12-24 NOTE — Patient Instructions (Signed)
Patient ambulatory out of clinic without complaints.  Instructed to call with any issues. Patient aware of next appointment 

## 2011-12-24 NOTE — Progress Notes (Addendum)
Patient presents to the clinic today accompanied by his daughter for a follow up appointment with Dr. Dayton Scrape. Patient is alert and oriented to person, place, and time. No distress noted. Steady gait noted. Pleasant affect noted. Patient denies pain at this time. Patient's only complaint is difficulty sleeping but, he reports this to be a long standing problem . Patient reports he last visited [facility] on 12/19/2011 and was informed his PSA is down to 0.93. Will call again to request this office note. Last visit to [facility] was in June. Reported all findings to Dr. Dayton Scrape.

## 2011-12-24 NOTE — Progress Notes (Signed)
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 He is doing well from this standpoint. His voice is now strong and has returned to normal. He missed his last followup visit with Dr. Suzanna Obey, but is scheduled to see him again this summer. He has been off chemotherapy for his myeloma and there is no evidence for progression. He is on active surveillance for his favorable risk adenocarcinoma prostate through Dr. Retta Diones. His last PSA from January 10 was 0.93 off any therapy.  Physical examination: Nodes: No palpable lymphadenopathy in the neck. Oral cavity and oropharynx are unremarkable to inspection with surgical absence of the uvula as described previously. Indirect mirror examination without evidence for recurrent disease within the glottic larynx. Both true vocal cords move well.  Impression: No evidence for recurrent laryngeal cancer.  Plan: I instructed the patient to see Dr. Jearld Fenton this spring, and I will see him back for a followup visit in 6 months.

## 2011-12-26 ENCOUNTER — Telehealth: Payer: Self-pay | Admitting: *Deleted

## 2011-12-26 NOTE — Telephone Encounter (Signed)
Daughter notified that light chains are normal.

## 2011-12-26 NOTE — Telephone Encounter (Signed)
Message copied by Wandalee Ferdinand on Thu Dec 26, 2011  7:01 PM ------      Message from: Thornton Papas B      Created: Sun Dec 22, 2011  7:25 PM       Please call patient, light chains are normal, f/u as scheduled

## 2012-01-14 ENCOUNTER — Telehealth: Payer: Self-pay | Admitting: *Deleted

## 2012-01-14 NOTE — Telephone Encounter (Signed)
Having lower back pain with some radiation to his right leg and slight numbness in right leg. Patient afraid it could be cancer in his spine. Asking if Dr. Truett Perna should see him, or see Dr. Danielle Dess or chiropractor to assess? Has been hurting for last 2-3 weeks now. Patient has H/O chronic back pain. Suggested she have Dr. Danielle Dess see him 1st. Discouraged seeing chiropractor.

## 2012-03-24 ENCOUNTER — Telehealth: Payer: Self-pay | Admitting: Oncology

## 2012-03-24 ENCOUNTER — Ambulatory Visit (HOSPITAL_BASED_OUTPATIENT_CLINIC_OR_DEPARTMENT_OTHER): Payer: Medicare Other

## 2012-03-24 ENCOUNTER — Ambulatory Visit: Payer: Medicare Other | Admitting: Oncology

## 2012-03-24 ENCOUNTER — Other Ambulatory Visit (HOSPITAL_BASED_OUTPATIENT_CLINIC_OR_DEPARTMENT_OTHER): Payer: Medicare Other | Admitting: Lab

## 2012-03-24 DIAGNOSIS — C888 Other malignant immunoproliferative diseases: Secondary | ICD-10-CM

## 2012-03-24 DIAGNOSIS — C9000 Multiple myeloma not having achieved remission: Secondary | ICD-10-CM

## 2012-03-24 LAB — BASIC METABOLIC PANEL
BUN: 17 mg/dL (ref 6–23)
Chloride: 96 mEq/L (ref 96–112)
Creatinine, Ser: 1.18 mg/dL (ref 0.50–1.35)

## 2012-03-24 LAB — CBC WITH DIFFERENTIAL/PLATELET
Eosinophils Absolute: 0.2 10*3/uL (ref 0.0–0.5)
HCT: 39.6 % (ref 38.4–49.9)
HGB: 13.9 g/dL (ref 13.0–17.1)
LYMPH%: 22.4 % (ref 14.0–49.0)
MONO#: 0.5 10*3/uL (ref 0.1–0.9)
NEUT#: 3.1 10*3/uL (ref 1.5–6.5)
NEUT%: 62.5 % (ref 39.0–75.0)
Platelets: 103 10*3/uL — ABNORMAL LOW (ref 140–400)
WBC: 5 10*3/uL (ref 4.0–10.3)
lymph#: 1.1 10*3/uL (ref 0.9–3.3)
nRBC: 0 % (ref 0–0)

## 2012-03-24 MED ORDER — ZOLEDRONIC ACID 4 MG/100ML IV SOLN
4.0000 mg | Freq: Once | INTRAVENOUS | Status: AC
  Administered 2012-03-24: 4 mg via INTRAVENOUS
  Filled 2012-03-24: qty 100

## 2012-03-24 MED ORDER — SODIUM CHLORIDE 0.9 % IV SOLN
Freq: Once | INTRAVENOUS | Status: AC
  Administered 2012-03-24: 12:00:00 via INTRAVENOUS

## 2012-03-24 NOTE — Telephone Encounter (Signed)
Gave pt calendar for 06/23/12 lab,md and Zometa

## 2012-03-24 NOTE — Progress Notes (Signed)
OFFICE PROGRESS NOTE   INTERVAL HISTORY:   He returns as scheduled. No complaint. No pain. No problem with the Zometa, no tooth pain or loosening  Objective:  Vital signs in last 24 hours:  Blood pressure 132/78, pulse 79, temperature 97.2 F (36.2 C), temperature source Oral, height 5\' 8"  (1.727 m), weight 184 lb 14.4 oz (83.87 kg).    HEENT: Neck without mass Lymphatics: No cervical, supraclavicular, or axillary nodes Resp: Lungs clear bilaterally Cardio: Regular rate and rhythm GI: No hepatosplenomegaly Vascular: No leg edema  Lab Results:  Lab Results  Component Value Date   WBC 5.0 03/24/2012   HGB 13.9 03/24/2012   HCT 39.6 03/24/2012   MCV 92.7 03/24/2012   PLT 103* 03/24/2012   ANC 3.1   Medications: I have reviewed the patient's current medications.  Assessment/Plan: 1. Multiple myeloma: The serum free light chains were elevated 08/29/2009. A CT of the chest 08/31/2009 showed a new pleural mass consistent with progression of multiple myeloma. He completed 12 cycles of Revlimid/Decadron. The serum free light chains were improved to 4.34 on 12/29/2009. A restaging CT of the chest 02/19/2010 showed resolution of right axillary lymphadenopathy and a pleural-based mass. The serum free light chains were normal on 12/17/2011 2. History of back pain, likely related to the pleural-based mass at the right chest, resolved. 3. Right axillary/subpectoral lymphadenopathy on a CT of the chest 08/31/2009, likely related to multiple myeloma, resolved. 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 5. Pancytopenia secondary to Revlimid and multiple myeloma. He has persistent mild thrombocytopenia. 6. Hypertension. 7. Status post hip replacement. 8. History of back surgery. 9. History of trigeminal neuralgia. 10. Status post removal of a lipoma from the right axilla in February 2010. 11. 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 12. Esophageal reflux  disease, followed by Dr. Jearld Fenton.  13. Esophageal stricture, status post an evaluation by Dr. Marina Goodell. 14. Right vocal cord lesion, status post a biopsy by Dr. 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 He completed radiation under the direction of Dr. Dayton Scrape on 03/15/2011. 15. Admission with pneumonia 03/26/2010. 16. Atrial flutter/fibrillation while hospitalized August 2011. He is followed by Dr. Antoine Poche. 17. Elevated prostate specific antigen, status post a biopsy 01/23/2011. He was found to have a Gleason 6 cancer in 10% of the cores. He is followed with an observation approach. He is now followed by Dr. Retta Diones.    Disposition:  He appears stable. He will receive Zometa today. We will followup on the serum free light chains from today. He will return for an office visit and Zometa in 3 months.   Lucile Shutters, MD  03/24/2012  5:52 PM

## 2012-03-24 NOTE — Patient Instructions (Signed)
Pt discharged home ambulatory with instructions to call for questions and concerns.

## 2012-03-25 LAB — KAPPA/LAMBDA LIGHT CHAINS: Lambda Free Lght Chn: 2.43 mg/dL (ref 0.57–2.63)

## 2012-03-30 ENCOUNTER — Telehealth: Payer: Self-pay | Admitting: *Deleted

## 2012-03-30 NOTE — Telephone Encounter (Signed)
Notified daughter that light chains are normal.

## 2012-03-30 NOTE — Telephone Encounter (Signed)
Message copied by Wandalee Ferdinand on Mon Mar 30, 2012  6:27 PM ------      Message from: Thornton Papas B      Created: Wed Mar 25, 2012  9:01 PM       Please call patient, light chains are normal

## 2012-04-24 ENCOUNTER — Other Ambulatory Visit: Payer: Self-pay | Admitting: Neurological Surgery

## 2012-04-24 DIAGNOSIS — R269 Unspecified abnormalities of gait and mobility: Secondary | ICD-10-CM

## 2012-04-24 DIAGNOSIS — M5416 Radiculopathy, lumbar region: Secondary | ICD-10-CM

## 2012-04-28 ENCOUNTER — Other Ambulatory Visit: Payer: Self-pay | Admitting: Dermatology

## 2012-05-01 ENCOUNTER — Ambulatory Visit
Admission: RE | Admit: 2012-05-01 | Discharge: 2012-05-01 | Disposition: A | Discharge status: Home / Self Care | Payer: Medicare Other | Source: Ambulatory Visit | Attending: Neurological Surgery | Admitting: Neurological Surgery

## 2012-05-01 DIAGNOSIS — R269 Unspecified abnormalities of gait and mobility: Secondary | ICD-10-CM

## 2012-05-01 DIAGNOSIS — M5416 Radiculopathy, lumbar region: Secondary | ICD-10-CM

## 2012-05-01 MED ORDER — GADOBENATE DIMEGLUMINE 529 MG/ML IV SOLN
17.0000 mL | Freq: Once | INTRAVENOUS | Status: AC | PRN
  Administered 2012-05-01: 17 mL via INTRAVENOUS

## 2012-06-15 ENCOUNTER — Other Ambulatory Visit: Payer: Self-pay | Admitting: Cardiology

## 2012-06-23 ENCOUNTER — Ambulatory Visit: Payer: Medicare Other | Admitting: Radiation Oncology

## 2012-06-23 ENCOUNTER — Ambulatory Visit (HOSPITAL_BASED_OUTPATIENT_CLINIC_OR_DEPARTMENT_OTHER): Payer: Medicare Other

## 2012-06-23 ENCOUNTER — Ambulatory Visit (HOSPITAL_BASED_OUTPATIENT_CLINIC_OR_DEPARTMENT_OTHER): Payer: Medicare Other | Admitting: Oncology

## 2012-06-23 ENCOUNTER — Other Ambulatory Visit (HOSPITAL_BASED_OUTPATIENT_CLINIC_OR_DEPARTMENT_OTHER): Payer: Medicare Other | Admitting: Lab

## 2012-06-23 ENCOUNTER — Telehealth: Payer: Self-pay | Admitting: Oncology

## 2012-06-23 ENCOUNTER — Telehealth: Payer: Self-pay | Admitting: *Deleted

## 2012-06-23 VITALS — BP 146/82 | HR 75 | Temp 97.5°F | Ht 68.0 in | Wt 188.2 lb

## 2012-06-23 DIAGNOSIS — D801 Nonfamilial hypogammaglobulinemia: Secondary | ICD-10-CM

## 2012-06-23 DIAGNOSIS — D61818 Other pancytopenia: Secondary | ICD-10-CM

## 2012-06-23 DIAGNOSIS — C9000 Multiple myeloma not having achieved remission: Secondary | ICD-10-CM

## 2012-06-23 DIAGNOSIS — D696 Thrombocytopenia, unspecified: Secondary | ICD-10-CM

## 2012-06-23 LAB — CBC WITH DIFFERENTIAL/PLATELET
Basophils Absolute: 0 10*3/uL (ref 0.0–0.1)
EOS%: 1.5 % (ref 0.0–7.0)
HCT: 42.3 % (ref 38.4–49.9)
HGB: 15 g/dL (ref 13.0–17.1)
MCH: 32.2 pg (ref 27.2–33.4)
MCV: 91.1 fL (ref 79.3–98.0)
MONO%: 9.3 % (ref 0.0–14.0)
NEUT%: 64.1 % (ref 39.0–75.0)
Platelets: 102 10*3/uL — ABNORMAL LOW (ref 140–400)
lymph#: 1.6 10*3/uL (ref 0.9–3.3)

## 2012-06-23 LAB — BASIC METABOLIC PANEL
BUN: 14 mg/dL (ref 6–23)
Chloride: 93 mEq/L — ABNORMAL LOW (ref 96–112)
Creatinine, Ser: 1.12 mg/dL (ref 0.50–1.35)

## 2012-06-23 MED ORDER — ZOLEDRONIC ACID 4 MG/100ML IV SOLN
4.0000 mg | Freq: Once | INTRAVENOUS | Status: AC
  Administered 2012-06-23: 4 mg via INTRAVENOUS
  Filled 2012-06-23: qty 100

## 2012-06-23 NOTE — Telephone Encounter (Signed)
appts made and printed for pt and aware that mw will at zometa

## 2012-06-23 NOTE — Telephone Encounter (Signed)
Per staff message and POF I have scheduled appts.  JMW  

## 2012-06-23 NOTE — Progress Notes (Signed)
   [facility]    OFFICE PROGRESS NOTE   INTERVAL HISTORY:   He returns as scheduled. He feels a fullness in the right ear. No hearing loss. He has been cleaning the ear with a peroxide solution. No pain. No other complaint. He saw Dr. Danielle Dess for gait abnormality and an MRI of the brain on may 24th 2013 revealed no acute abnormality.  Objective:  Vital signs in last 24 hours:  Blood pressure 146/82, pulse 75, temperature 97.5 F (36.4 C), temperature source Oral, height 5\' 8"  (1.727 m), weight 188 lb 3.2 oz (85.367 kg).    HEENT: Mild cerumen in the right external canal, no evidence of infection,? Fluid at the right tympanic membrane Lymphatics: No cervical, supraclavicular, or axillary nodes Resp: Lungs clear bilaterally Cardio: Regular rate and rhythm GI: No hepatosplenomegaly, nontender Vascular: No leg edema   Lab Results:  Lab Results  Component Value Date   WBC 6.3 06/23/2012   HGB 15.0 06/23/2012   HCT 42.3 06/23/2012   MCV 91.1 06/23/2012   PLT 102* 06/23/2012   ANC 4.1 Lambda free light chains 2.43 on 03/24/2012  Medications: I have reviewed the patient's current medications.  Assessment/Plan: 1. Multiple myeloma: The serum free light chains were elevated 08/29/2009. A CT of the chest 08/31/2009 showed a new pleural mass consistent with progression of multiple myeloma. He completed 12 cycles of Revlimid/Decadron. The serum free light chains were improved to 4.34 on 12/29/2009. A restaging CT of the chest 02/19/2010 showed resolution of right axillary lymphadenopathy and a pleural-based mass. The serum free light chains were normal on 03/24/2012. 2. History of back pain, likely related to the pleural-based mass at the right chest, resolved. 3. Right axillary/subpectoral lymphadenopathy on a CT of the chest 08/31/2009, likely related to multiple myeloma, resolved. 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 5. Pancytopenia secondary  to Revlimid and multiple myeloma. He has persistent mild thrombocytopenia. 6. Hypertension. 7. Status post hip replacement. 8. History of back surgery. 9. History of trigeminal neuralgia. 10. Status post removal of a lipoma from the right axilla in February 2010. 11. 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 12. Esophageal reflux disease, followed by Dr. Jearld Fenton.  13. Esophageal stricture, status post an evaluation by Dr. Marina Goodell. 14. Right vocal cord lesion, status post a biopsy by Dr. 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 He completed radiation under the direction of Dr. Dayton Scrape on 03/15/2011. 15. Admission with pneumonia 03/26/2010. 16. Atrial flutter/fibrillation while hospitalized August 2011. He is followed by Dr. Antoine Poche. 17. Elevated prostate specific antigen, status post a biopsy 01/23/2011. He was found to have a Gleason 6 cancer in 10% of the cores. He is followed with an observation approach. He is now followed by Dr. Retta Diones.    Disposition:  He appears stable. No clinical evidence for progression of the myeloma. He will receive Zometa today. We will followup on the serum free lambda light chains from today. Mr. Kernen will return for an office visit and Zometa in 3 months.   Thornton Papas, MD  06/23/2012  10:59 AM

## 2012-06-23 NOTE — Patient Instructions (Signed)
[facility] Discharge Instructions for Patients Receiving Chemotherapy  Today you received the following chemotherapy agents Zometa To help prevent nausea and vomiting after your treatment, we encourage you to take your nausea medication as prescribed. If you develop nausea and vomiting that is not controlled by your nausea medication, call the clinic. If it is after clinic hours your family physician or the after hours number for the clinic or go to the Emergency Department.   BELOW ARE SYMPTOMS THAT SHOULD BE REPORTED IMMEDIATELY:  *FEVER GREATER THAN 100.5 F  *CHILLS WITH OR WITHOUT FEVER  NAUSEA AND VOMITING THAT IS NOT CONTROLLED WITH YOUR NAUSEA MEDICATION  *UNUSUAL SHORTNESS OF BREATH  *UNUSUAL BRUISING OR BLEEDING  TENDERNESS IN MOUTH AND THROAT WITH OR WITHOUT PRESENCE OF ULCERS  *URINARY PROBLEMS  *BOWEL PROBLEMS  UNUSUAL RASH Items with * indicate a potential emergency and should be followed up as soon as possible.  One of the nurses will contact you 24 hours after your treatment. Please let the nurse know about any problems that you may have experienced. Feel free to call the clinic you have any questions or concerns. The clinic phone number is [phone].   I have been informed and understand all the instructions given to me. I know to contact the clinic, my physician, or go to the Emergency Department if any problems should occur. I do not have any questions at this time, but understand that I may call the clinic during office hours   should I have any questions or need assistance in obtaining follow up care.    __________________________________________  _____________  __________ Signature of Patient or Authorized Representative            Date                   Time    __________________________________________ Nurse's Signature    

## 2012-06-24 LAB — KAPPA/LAMBDA LIGHT CHAINS
Kappa free light chain: 0.57 mg/dL (ref 0.33–1.94)
Kappa:Lambda Ratio: 0.21 — ABNORMAL LOW (ref 0.26–1.65)
Lambda Free Lght Chn: 2.66 mg/dL — ABNORMAL HIGH (ref 0.57–2.63)

## 2012-06-25 ENCOUNTER — Telehealth: Payer: Self-pay | Admitting: *Deleted

## 2012-06-25 NOTE — Telephone Encounter (Signed)
Left message on voicemail for pt to call office for lab results. 

## 2012-06-25 NOTE — Telephone Encounter (Signed)
Message copied by Caleb Popp on Thu Jun 25, 2012 12:45 PM ------      Message from: Richard [name]      Created: Wed Jun 24, 2012  8:54 PM       Please call patient, light chains are stable, f/u as scheduled

## 2012-07-03 ENCOUNTER — Encounter: Payer: Self-pay | Admitting: Radiation Oncology

## 2012-07-03 DIAGNOSIS — C320 Malignant neoplasm of glottis: Secondary | ICD-10-CM | POA: Insufficient documentation

## 2012-07-07 ENCOUNTER — Ambulatory Visit
Admission: RE | Admit: 2012-07-07 | Discharge: 2012-07-07 | Disposition: A | Discharge status: Home / Self Care | Payer: Medicare Other | Source: Ambulatory Visit | Attending: Radiation Oncology | Admitting: Radiation Oncology

## 2012-07-07 VITALS — BP 133/78 | HR 75 | Temp 97.7°F | Resp 20 | Wt 187.4 lb

## 2012-07-07 DIAGNOSIS — C320 Malignant neoplasm of glottis: Secondary | ICD-10-CM

## 2012-07-07 NOTE — Progress Notes (Signed)
Followup note:  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 He is doing well. I understand that he saw Dr. Jearld Fenton back in April. His also being followed closely by Dr. Truett Perna for his myeloma which is in remission. He is undergoing active surveillance for his favorable risk adenocarcinoma prostate to Dr. Retta Diones. His last PSA was in January which was 0.93. I believe that he'll see Dr. Retta Diones again in January of 2014. He recently had excision of a basal cell carcinoma along his right nose.  Physical examination: Alert and oriented. Wt Readings from Last 3 Encounters:  07/07/12 187 lb 6.4 oz (85.004 kg)  06/23/12 188 lb 3.2 oz (85.367 kg)  03/24/12 184 lb 14.4 oz (83.87 kg)   Temp Readings from Last 3 Encounters:  07/07/12 97.7 F (36.5 C)   06/23/12 97.5 F (36.4 C) Oral  03/24/12 97.2 F (36.2 C) Oral   BP Readings from Last 3 Encounters:  07/07/12 133/78  06/23/12 146/82  03/24/12 132/78   Pulse Readings from Last 3 Encounters:  07/07/12 75  06/23/12 75  03/24/12 79   Head and neck examination: There is a surgical dressing along his right nose which is not removed. Nodes: There is no palpable adenopathy in the neck. Oral cavity and oropharynx are unremarkable to inspection. There is surgical absence of the uvula. Indirect mirror examination is without evidence for recurrent disease. Both true vocal cords move well.  Impression: No evidence for recurrent vocal cord cancer.  Plan: Followup visit here in 6 months, and followup with Dr. Jearld Fenton in approximately 3 months. We'll also maintain his followup with Dr. Truett Perna and also Dr. Retta Diones.

## 2012-07-07 NOTE — Progress Notes (Signed)
Pt denies issues w/eating, swallowing, fatigue, loss of appetite. No dryness of mouth. Pt had skin cancer removed from right side of nose yesterday; taking Aleve prn for soreness. Dressing in place w/slight amt dry blood on bandage.

## 2012-09-22 ENCOUNTER — Ambulatory Visit (HOSPITAL_BASED_OUTPATIENT_CLINIC_OR_DEPARTMENT_OTHER): Payer: Medicare Other | Admitting: Oncology

## 2012-09-22 ENCOUNTER — Telehealth: Payer: Self-pay | Admitting: Oncology

## 2012-09-22 ENCOUNTER — Telehealth: Payer: Self-pay | Admitting: *Deleted

## 2012-09-22 ENCOUNTER — Other Ambulatory Visit (HOSPITAL_BASED_OUTPATIENT_CLINIC_OR_DEPARTMENT_OTHER): Payer: Medicare Other | Admitting: Lab

## 2012-09-22 ENCOUNTER — Ambulatory Visit: Payer: Medicare Other

## 2012-09-22 VITALS — BP 146/84 | HR 77 | Temp 97.5°F | Resp 20 | Ht 68.0 in | Wt 187.8 lb

## 2012-09-22 DIAGNOSIS — C9000 Multiple myeloma not having achieved remission: Secondary | ICD-10-CM

## 2012-09-22 DIAGNOSIS — Z23 Encounter for immunization: Secondary | ICD-10-CM

## 2012-09-22 LAB — CBC WITH DIFFERENTIAL/PLATELET
BASO%: 0.2 % (ref 0.0–2.0)
EOS%: 1.6 % (ref 0.0–7.0)
MCH: 32.7 pg (ref 27.2–33.4)
MCHC: 35.3 g/dL (ref 32.0–36.0)
NEUT%: 60.7 % (ref 39.0–75.0)
RDW: 14.1 % (ref 11.0–14.6)
lymph#: 1.4 10*3/uL (ref 0.9–3.3)

## 2012-09-22 LAB — BASIC METABOLIC PANEL (CC13)
CO2: 27 mEq/L (ref 22–29)
Calcium: 10.5 mg/dL — ABNORMAL HIGH (ref 8.4–10.4)
Creatinine: 1.1 mg/dL (ref 0.7–1.3)

## 2012-09-22 MED ORDER — INFLUENZA VIRUS VACC SPLIT PF IM SUSP
0.5000 mL | Freq: Once | INTRAMUSCULAR | Status: AC
  Administered 2012-09-22: 0.5 mL via INTRAMUSCULAR
  Filled 2012-09-22: qty 0.5

## 2012-09-22 NOTE — Progress Notes (Signed)
[facility]    OFFICE PROGRESS NOTE   INTERVAL HISTORY:   He returns as scheduled. He feels well. He has developed "soreness "at the right anterior chest wall for the past week. He wonders whether this is related to exercise. No other complaint.  Objective:  Vital signs in last 24 hours:  Blood pressure 146/84, pulse 77, temperature 97.5 F (36.4 C), temperature source Oral, resp. rate 20, height 5\' 8"  (1.727 m), weight 187 lb 12.8 oz (85.186 kg).    HEENT: Neck without mass Lymphatics: No cervical, supraclavicular, or axillary nodes Resp: Lungs clear bilaterally Cardio: Regular rate and rhythm GI: No hepatosplenomegaly Vascular: No leg edema  Musculoskeletal: There is tenderness over the right anterior chest wall lateral to the right breast and near the axillary line. The tenderness appears to be over a rib. No mass. No rash.    Lab Results:  Lab Results  Component Value Date   WBC 5.4 09/22/2012   HGB 15.0 09/22/2012   HCT 42.5 09/22/2012   MCV 92.8 09/22/2012   PLT 114* 09/22/2012   ANC 3.3    Medications: I have reviewed the patient's current medications.  Assessment/Plan: 1. Multiple myeloma: The serum free light chains were elevated 08/29/2009. A CT of the chest 08/31/2009 showed a new pleural mass consistent with progression of multiple myeloma. He completed 12 cycles of Revlimid/Decadron. The serum free light chains were improved to 4.34 on 12/29/2009. A restaging CT of the chest 02/19/2010 showed resolution of right axillary lymphadenopathy and a pleural-based mass. The serum free lambda light chains were mildly elevated on 06/23/2012. 2. History of back pain, likely related to the pleural-based mass at the right chest, resolved. 3. Right axillary/subpectoral lymphadenopathy on a CT of the chest 08/31/2009, likely related to multiple myeloma, resolved. 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 5. Pancytopenia secondary to  Revlimid and multiple myeloma. He has persistent mild thrombocytopenia. 6. Hypertension. 7. Status post hip replacement. 8. History of back surgery. 9. History of trigeminal neuralgia. 10. Status post removal of a lipoma from the right axilla in February 2010. 11. 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 12. Esophageal reflux disease, followed by Dr. Jearld Fenton.  13. Esophageal stricture, status post an evaluation by Dr. Marina Goodell. 14. Right vocal cord lesion, status post a biopsy by Dr. 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 He completed radiation under the direction of Dr. Dayton Scrape on 03/15/2011. 15. Admission with pneumonia 03/26/2010. 16. Atrial flutter/fibrillation while hospitalized August 2011. He is followed by Dr. Antoine Poche. 17. Elevated prostate specific antigen, status post a biopsy 01/23/2011. He was found to have a Gleason 6 cancer in 10% of the cores. He is followed with an observation approach. He is now followed by Dr. Retta Diones.  18. Tenderness at the right anterior chest wall-? Related to a benign musculoskeletal condition versus a myeloma lesion involving the rib, he will contact us if this discomfort persists.      Disposition:  Richard [name] appears stable. We will followup on the serum free light chain analysis and chemistry panel from today. He is due for Zometa today, but he declines Zometa because his insurance company did not pay for the last dose. His daughter is working on [employer]. He will be scheduled for an office visit and Zometa in 3 months. Richard [name] will be scheduled for a metastatic bone survey within the next few weeks. He received an influenza vaccine today.   Adelaine Roppolo,  [name], MD  09/22/2012  10:11 AM

## 2012-09-22 NOTE — Telephone Encounter (Signed)
appts made and printed for pt,per ?grandaughter the zometa was cx today due to ins. Not covering it and they do wasn't to have a bill.  They hope that ins will cover the appt in 12/2012       aom

## 2012-09-22 NOTE — Telephone Encounter (Signed)
Per staff message and POF I have scduled appts.   JMW

## 2012-09-23 ENCOUNTER — Encounter: Payer: Self-pay | Admitting: Oncology

## 2012-09-23 NOTE — Progress Notes (Signed)
Pt is approved for 55% financial assistance effective 09/30/12 - 03/31/13.  I will mail approval letter and green card today.

## 2012-09-24 LAB — KAPPA/LAMBDA LIGHT CHAINS
Kappa:Lambda Ratio: 0.21 — ABNORMAL LOW (ref 0.26–1.65)
Lambda Free Lght Chn: 5 mg/dL — ABNORMAL HIGH (ref 0.57–2.63)

## 2012-09-25 ENCOUNTER — Other Ambulatory Visit: Payer: Self-pay | Admitting: *Deleted

## 2012-09-25 ENCOUNTER — Telehealth: Payer: Self-pay | Admitting: *Deleted

## 2012-09-25 ENCOUNTER — Telehealth: Payer: Self-pay | Admitting: Oncology

## 2012-09-25 DIAGNOSIS — C9000 Multiple myeloma not having achieved remission: Secondary | ICD-10-CM

## 2012-09-25 NOTE — Telephone Encounter (Signed)
Message copied by Wandalee Ferdinand on Fri Sep 25, 2012 11:51 AM ------      Message from: Thornton Papas B      Created: Fri Sep 25, 2012  7:39 AM       Please call patient, lamda light chains are mildly elevated, repeat cbc, bmet , light chains 6 weeks            cmet 2-3 weeks to f/u on Calcium

## 2012-09-25 NOTE — Telephone Encounter (Signed)
Notified daughter of lab results. Will recheck Cmet in 2-3 weeks and CBC,Bmet, light chains in 6 weeks. She requests that she be called for the appointment.

## 2012-09-25 NOTE — Telephone Encounter (Signed)
s/w pt and she is aware of the lab appts   aom

## 2012-10-12 ENCOUNTER — Ambulatory Visit (HOSPITAL_COMMUNITY)
Admission: RE | Admit: 2012-10-12 | Discharge: 2012-10-12 | Disposition: A | Discharge status: Home / Self Care | Payer: Medicare Other | Source: Ambulatory Visit | Attending: Oncology | Admitting: Oncology

## 2012-10-12 ENCOUNTER — Other Ambulatory Visit (HOSPITAL_BASED_OUTPATIENT_CLINIC_OR_DEPARTMENT_OTHER): Payer: Medicare Other | Admitting: Lab

## 2012-10-12 DIAGNOSIS — C9000 Multiple myeloma not having achieved remission: Secondary | ICD-10-CM

## 2012-10-12 LAB — COMPREHENSIVE METABOLIC PANEL (CC13)
Albumin: 4.1 g/dL (ref 3.5–5.0)
Alkaline Phosphatase: 58 U/L (ref 40–150)
BUN: 13 mg/dL (ref 7.0–26.0)
Glucose: 101 mg/dl — ABNORMAL HIGH (ref 70–99)
Potassium: 4.3 mEq/L (ref 3.5–5.1)
Total Bilirubin: 0.67 mg/dL (ref 0.20–1.20)

## 2012-10-14 ENCOUNTER — Telehealth: Payer: Self-pay | Admitting: *Deleted

## 2012-10-14 NOTE — Telephone Encounter (Signed)
Notified daughter of bone survey results and new slight compression fracture at L3. She wishes to confirm that MD still wants labs on 11/02/12 since he just had repeat cmet 11/4 and light chains were done 09/22/12.?

## 2012-10-14 NOTE — Telephone Encounter (Signed)
Message copied by Wandalee Ferdinand on Wed Oct 14, 2012  3:31 PM ------      Message from: Richard [name]      Created: Mon Oct 12, 2012  9:01 PM       Please call patient, bone survey is ok, new slight L3 compression fracture            F/u as scheduled

## 2012-10-15 ENCOUNTER — Telehealth: Payer: Self-pay | Admitting: *Deleted

## 2012-10-15 NOTE — Telephone Encounter (Signed)
Message left for daughter, Juliette Alcide to call back for message:  OK to wait until Jan appt for lab if pt wants.  Lab appt cancelled per Dr Truett Perna.

## 2012-10-16 ENCOUNTER — Telehealth: Payer: Self-pay | Admitting: *Deleted

## 2012-10-16 NOTE — Telephone Encounter (Signed)
Left message that MD said we can delay his November labs until January appointment.

## 2012-10-28 ENCOUNTER — Telehealth: Payer: Self-pay | Admitting: Oncology

## 2012-10-28 NOTE — Telephone Encounter (Signed)
Called pt back regarding appt for 11/25 that pt wants to cancel

## 2012-11-02 ENCOUNTER — Other Ambulatory Visit: Payer: Medicare Other | Admitting: Lab

## 2012-12-15 ENCOUNTER — Telehealth: Payer: Self-pay | Admitting: *Deleted

## 2012-12-15 ENCOUNTER — Ambulatory Visit (HOSPITAL_BASED_OUTPATIENT_CLINIC_OR_DEPARTMENT_OTHER): Payer: Medicare Other

## 2012-12-15 ENCOUNTER — Ambulatory Visit (HOSPITAL_BASED_OUTPATIENT_CLINIC_OR_DEPARTMENT_OTHER): Payer: Medicare Other | Admitting: Oncology

## 2012-12-15 ENCOUNTER — Other Ambulatory Visit (HOSPITAL_BASED_OUTPATIENT_CLINIC_OR_DEPARTMENT_OTHER): Payer: Medicare Other | Admitting: Lab

## 2012-12-15 ENCOUNTER — Telehealth: Payer: Self-pay | Admitting: Oncology

## 2012-12-15 VITALS — BP 132/80 | HR 77 | Temp 98.0°F | Resp 20 | Ht 68.0 in | Wt 183.4 lb

## 2012-12-15 VITALS — BP 120/69 | HR 65

## 2012-12-15 DIAGNOSIS — C9000 Multiple myeloma not having achieved remission: Secondary | ICD-10-CM

## 2012-12-15 DIAGNOSIS — N401 Enlarged prostate with lower urinary tract symptoms: Secondary | ICD-10-CM

## 2012-12-15 DIAGNOSIS — I4891 Unspecified atrial fibrillation: Secondary | ICD-10-CM

## 2012-12-15 DIAGNOSIS — D801 Nonfamilial hypogammaglobulinemia: Secondary | ICD-10-CM

## 2012-12-15 LAB — CBC WITH DIFFERENTIAL/PLATELET
Basophils Absolute: 0 10*3/uL (ref 0.0–0.1)
Eosinophils Absolute: 0.1 10*3/uL (ref 0.0–0.5)
HGB: 15.5 g/dL (ref 13.0–17.1)
MCV: 91.5 fL (ref 79.3–98.0)
MONO#: 0.6 10*3/uL (ref 0.1–0.9)
MONO%: 10 % (ref 0.0–14.0)
NEUT#: 3.5 10*3/uL (ref 1.5–6.5)
RDW: 13.6 % (ref 11.0–14.6)
WBC: 5.6 10*3/uL (ref 4.0–10.3)

## 2012-12-15 LAB — BASIC METABOLIC PANEL (CC13)
BUN: 23 mg/dL (ref 7.0–26.0)
CO2: 26 mEq/L (ref 22–29)
Chloride: 97 mEq/L — ABNORMAL LOW (ref 98–107)
Glucose: 110 mg/dl — ABNORMAL HIGH (ref 70–99)
Potassium: 4.4 mEq/L (ref 3.5–5.1)

## 2012-12-15 MED ORDER — HEPARIN SOD (PORK) LOCK FLUSH 100 UNIT/ML IV SOLN
500.0000 [IU] | Freq: Once | INTRAVENOUS | Status: DC | PRN
  Filled 2012-12-15: qty 5

## 2012-12-15 MED ORDER — SODIUM CHLORIDE 0.9 % IJ SOLN
10.0000 mL | INTRAMUSCULAR | Status: DC | PRN
  Filled 2012-12-15: qty 10

## 2012-12-15 MED ORDER — ZOLEDRONIC ACID 4 MG/100ML IV SOLN
4.0000 mg | Freq: Once | INTRAVENOUS | Status: AC
  Administered 2012-12-15: 4 mg via INTRAVENOUS
  Filled 2012-12-15: qty 100

## 2012-12-15 MED ORDER — SODIUM CHLORIDE 0.9 % IV SOLN
Freq: Once | INTRAVENOUS | Status: AC
  Administered 2012-12-15: 100 mL via INTRAVENOUS

## 2012-12-15 MED ORDER — ALTEPLASE 2 MG IJ SOLR
2.0000 mg | Freq: Once | INTRAMUSCULAR | Status: DC | PRN
  Filled 2012-12-15: qty 2

## 2012-12-15 MED ORDER — HEPARIN SOD (PORK) LOCK FLUSH 100 UNIT/ML IV SOLN
250.0000 [IU] | Freq: Once | INTRAVENOUS | Status: DC | PRN
  Filled 2012-12-15: qty 5

## 2012-12-15 MED ORDER — SODIUM CHLORIDE 0.9 % IJ SOLN
3.0000 mL | Freq: Once | INTRAMUSCULAR | Status: DC | PRN
  Filled 2012-12-15: qty 10

## 2012-12-15 NOTE — Telephone Encounter (Signed)
appts made and printed for pt  °

## 2012-12-15 NOTE — Progress Notes (Signed)
    [facility]    OFFICE PROGRESS NOTE   INTERVAL HISTORY:   He returns as scheduled. He feels well. No complaint. No pain.  Objective:  Vital signs in last 24 hours:  Blood pressure 132/80, pulse 77, temperature 98 F (36.7 C), temperature source Oral, resp. rate 20, height 5\' 8"  (1.727 m), weight 183 lb 6.4 oz (83.19 kg).    HEENT: No thrush Lymphatics: No cervical, supraclavicular, or axillary nodes Resp: Lungs clear bilaterally Cardio: Regular rate and rhythm GI: No hepatosplenomegaly Vascular: No leg edema   Lab Results:  Lab Results  Component Value Date   WBC 5.6 12/15/2012   HGB 15.5 12/15/2012   HCT 43.1 12/15/2012   MCV 91.5 12/15/2012   PLT 114* 12/15/2012   ANC 3.5 BUN 23, creatinine 1.2, calcium 10.7  Serum free lambda light chains on 09/22/2012-5.0   Medications: I have reviewed the patient's current medications.  Assessment/Plan: 1. Multiple myeloma: The serum free light chains were elevated 08/29/2009. A CT of the chest 08/31/2009 showed a new pleural mass consistent with progression of multiple myeloma. He completed 12 cycles of Revlimid/Decadron. The serum free light chains were improved to 4.34 on 12/29/2009. A restaging CT of the chest 02/19/2010 showed resolution of right axillary lymphadenopathy and a pleural-based mass. The serum free lambda light chains were mildly elevated on 09/22/2012 2. History of back pain, likely related to the pleural-based mass at the right chest, resolved. 3. Right axillary/subpectoral lymphadenopathy on a CT of the chest 08/31/2009, likely related to multiple myeloma, resolved. 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 5. Pancytopenia secondary to Revlimid and multiple myeloma. He has persistent mild thrombocytopenia. 6. Hypertension. 7. Status post hip replacement. 8. History of back surgery. 9. History of trigeminal neuralgia. 10. Status post removal of a lipoma from the right axilla  in February 2010. 11. 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 12. Esophageal reflux disease, followed by Dr. Jearld Fenton.  13. Esophageal stricture, status post an evaluation by Dr. Marina Goodell. 14. Right vocal cord lesion, status post a biopsy by Dr. 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 He completed radiation under the direction of Dr. Dayton Scrape on 03/15/2011. 15. Admission with pneumonia 03/26/2010. 16. Atrial flutter/fibrillation while hospitalized August 2011. He is followed by Dr. Antoine Poche. 17. Elevated prostate specific antigen, status post a biopsy 01/23/2011. He was found to have a Gleason 6 cancer in 10% of the cores. He is followed with an observation approach. He is now followed by Dr. Retta Diones. A PSA was checked today       18. Mild hypercalcemia-the calcium level is mildly elevated today, not significantly changed compared to 09/22/2012-he will receive Zometa today. He will return for a repeat calcium level in one month. He knows to contact us for symptoms of hypercalcemia.   Disposition:  Mr. Zieger remains asymptomatic from the multiple myeloma. The plan is to initiate systemic therapy if the calcium level is more elevated when he returns in one month or the serum free light chains are more significantly elevated.  He is scheduled for office visit in 3 months. He will receive Zometa today and again in 3 months.  The PSA was repeated today and he will followup with Dr. Retta Diones.   Thornton Papas, MD  12/15/2012  10:37 AM

## 2012-12-15 NOTE — Telephone Encounter (Signed)
Per staff message and POF I have scheduled appts.  JMW  

## 2012-12-15 NOTE — Patient Instructions (Addendum)
Zoledronic Acid injection (Hypercalcemia, Oncology) What is this medicine? ZOLEDRONIC ACID (ZOE le dron ik AS id) lowers the amount of calcium loss from bone. It is used to treat too much calcium in your blood from cancer. It is also used to prevent complications of cancer that has spread to the bone. This medicine may be used for other purposes; ask your health care provider or pharmacist if you have questions. What should I tell my health care provider before I take this medicine? They need to know if you have any of these conditions: -aspirin-sensitive asthma -dental disease -kidney disease -an unusual or allergic reaction to zoledronic acid, other medicines, foods, dyes, or preservatives -pregnant or trying to get pregnant -breast-feeding How should I use this medicine? This medicine is for infusion into a vein. It is given by a health care professional in a hospital or clinic setting. Talk to your pediatrician regarding the use of this medicine in children. Special care may be needed. Overdosage: If you think you have taken too much of this medicine contact a poison control center or emergency room at once. NOTE: This medicine is only for you. Do not share this medicine with others. What if I miss a dose? It is important not to miss your dose. Call your doctor or health care professional if you are unable to keep an appointment. What may interact with this medicine? -certain antibiotics given by injection -NSAIDs, medicines for pain and inflammation, like ibuprofen or naproxen -some diuretics like bumetanide, furosemide -teriparatide -thalidomide This list may not describe all possible interactions. Give your health care provider a list of all the medicines, herbs, non-prescription drugs, or dietary supplements you use. Also tell them if you smoke, drink alcohol, or use illegal drugs. Some items may interact with your medicine. What should I watch for while using this medicine? Visit  your doctor or health care professional for regular checkups. It may be some time before you see the benefit from this medicine. Do not stop taking your medicine unless your doctor tells you to. Your doctor may order blood tests or other tests to see how you are doing. Women should inform their doctor if they wish to become pregnant or think they might be pregnant. There is a potential for serious side effects to an unborn child. Talk to your health care professional or pharmacist for more information. You should make sure that you get enough calcium and vitamin D while you are taking this medicine. Discuss the foods you eat and the vitamins you take with your health care professional. Some people who take this medicine have severe bone, joint, and/or muscle pain. This medicine may also increase your risk for a broken thigh bone. Tell your doctor right away if you have pain in your upper leg or groin. Tell your doctor if you have any pain that does not go away or that gets worse. What side effects may I notice from receiving this medicine? Side effects that you should report to your doctor or health care professional as soon as possible: -allergic reactions like skin rash, itching or hives, swelling of the face, lips, or tongue -anxiety, confusion, or depression -breathing problems -changes in vision -feeling faint or lightheaded, falls -jaw burning, cramping, pain -muscle cramps, stiffness, or weakness -trouble passing urine or change in the amount of urine Side effects that usually do not require medical attention (report to your doctor or health care professional if they continue or are bothersome): -bone, joint, or muscle pain -  fever -hair loss -irritation at site where injected -loss of appetite -nausea, vomiting -stomach upset -tired This list may not describe all possible side effects. Call your doctor for medical advice about side effects. You may report side effects to FDA at  1-800-FDA-1088. Where should I keep my medicine? This drug is given in a hospital or clinic and will not be stored at home. NOTE: This sheet is a summary. It may not cover all possible information. If you have questions about this medicine, talk to your doctor, pharmacist, or health care provider.  2013, [employer]. (05/24/2011 9:06:58 AM)  

## 2012-12-16 ENCOUNTER — Telehealth: Payer: Self-pay | Admitting: *Deleted

## 2012-12-16 LAB — KAPPA/LAMBDA LIGHT CHAINS: Kappa free light chain: 1.24 mg/dL (ref 0.33–1.94)

## 2012-12-16 LAB — PSA: PSA: 1.08 ng/mL (ref <=4.00)

## 2012-12-16 NOTE — Telephone Encounter (Signed)
Spoke with pt's daughter. PSA results given. She requests to be called when light chains are resulted.

## 2012-12-16 NOTE — Telephone Encounter (Signed)
Message copied by Caleb Popp on Wed Dec 16, 2012 10:10 AM ------      Message from: Ladene Artist      Created: Tue Dec 15, 2012  9:17 PM       Please call patient, PSA is normal, copy to Dr. Retta Diones

## 2012-12-17 ENCOUNTER — Telehealth: Payer: Self-pay | Admitting: *Deleted

## 2012-12-17 NOTE — Telephone Encounter (Signed)
[name] daughter called for light chains results. Lab results given. Mildly elevated per Dr. Truett Perna. Follow up as scheduled for repeat lab in Feb. She voiced understanding.

## 2012-12-17 NOTE — Telephone Encounter (Signed)
Message copied by Caleb Popp on Thu Dec 17, 2012  2:08 PM ------      Message from: Thornton Papas B      Created: Wed Dec 16, 2012  9:27 PM       Please call patient, light chains are mildly elevated, repeat feb. As scheduled

## 2012-12-17 NOTE — Telephone Encounter (Signed)
Left message on voicemail for pt to call office for lab results. (Light chains are mildly elevated. Repeat lab in Feb as scheduled.)

## 2013-01-12 ENCOUNTER — Other Ambulatory Visit (HOSPITAL_BASED_OUTPATIENT_CLINIC_OR_DEPARTMENT_OTHER): Payer: Medicare Other | Admitting: Lab

## 2013-01-12 ENCOUNTER — Ambulatory Visit
Admission: RE | Admit: 2013-01-12 | Discharge: 2013-01-12 | Disposition: A | Discharge status: Home / Self Care | Payer: Medicare Other | Source: Ambulatory Visit | Attending: Radiation Oncology | Admitting: Radiation Oncology

## 2013-01-12 VITALS — BP 157/80 | HR 70 | Temp 97.6°F | Wt 184.8 lb

## 2013-01-12 DIAGNOSIS — N401 Enlarged prostate with lower urinary tract symptoms: Secondary | ICD-10-CM

## 2013-01-12 DIAGNOSIS — C9000 Multiple myeloma not having achieved remission: Secondary | ICD-10-CM

## 2013-01-12 DIAGNOSIS — C61 Malignant neoplasm of prostate: Secondary | ICD-10-CM

## 2013-01-12 DIAGNOSIS — C320 Malignant neoplasm of glottis: Secondary | ICD-10-CM

## 2013-01-12 LAB — COMPREHENSIVE METABOLIC PANEL (CC13)
ALT: 30 U/L (ref 0–55)
CO2: 26 mEq/L (ref 22–29)
Calcium: 10.7 mg/dL — ABNORMAL HIGH (ref 8.4–10.4)
Chloride: 96 mEq/L — ABNORMAL LOW (ref 98–107)
Potassium: 3.9 mEq/L (ref 3.5–5.1)
Sodium: 133 mEq/L — ABNORMAL LOW (ref 136–145)
Total Protein: 7.3 g/dL (ref 6.4–8.3)

## 2013-01-12 NOTE — Progress Notes (Signed)
CC Dr. Marcine Matar, Dr. Suzanna Obey  Followup note: Mr. 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 He is without complaints today. He maintains followup with Dr. Suzanna Obey. He is also undergoing close surveillance by Dr. Retta Diones for his favorable risk prostate cancer. He sees Dr. Retta Diones again later this month. I still he'll see Dr. Truett Perna and this month for followup of his myeloma. His daughter tells me that he may have a slight rise in his light chains suggesting some degree of progression of his multiple myeloma.  The physical examination:  Alert and oriented. He appears to be much younger than his stated age of 89. Wt Readings from Last 3 Encounters:  01/12/13 184 lb 12.8 oz (83.825 kg)  12/15/12 183 lb 6.4 oz (83.19 kg)  09/22/12 187 lb 12.8 oz (85.186 kg)   Temp Readings from Last 3 Encounters:  01/12/13 97.6 F (36.4 C)   12/15/12 98 F (36.7 C) Oral  09/22/12 97.5 F (36.4 C) Oral   BP Readings from Last 3 Encounters:  01/12/13 157/80  12/15/12 120/69  12/15/12 132/80   Pulse Readings from Last 3 Encounters:  01/12/13 70  12/15/12 65  12/15/12 77   Nodes: Without palpable lymphadenopathy in the neck. Oral cavity and oropharynx are unremarkable to inspection. On indirect mirror examination there is no evidence for recurrent disease along the endolarynx. Both true vocal cords move well.  Impression: Satisfactory progress with no evidence for recurrent disease along his larynx. He'll maintain followup with Drs. Dahlstedt for his prostate cancer in Dr. Truett Perna for his myeloma. Follow visit here in 6 months.

## 2013-01-12 NOTE — Progress Notes (Signed)
Patient here for routine follow up completion of vocal cord radiation on 03/15/11.Denies pain.No dry mouth or difficulty swallowing.Seen by Dr.Byers on October 09, 2012.Note has been called for.

## 2013-01-13 ENCOUNTER — Telehealth: Payer: Self-pay | Admitting: *Deleted

## 2013-01-13 DIAGNOSIS — C9000 Multiple myeloma not having achieved remission: Secondary | ICD-10-CM

## 2013-01-13 LAB — KAPPA/LAMBDA LIGHT CHAINS: Kappa:Lambda Ratio: 0.14 — ABNORMAL LOW (ref 0.26–1.65)

## 2013-01-13 NOTE — Telephone Encounter (Signed)
Message copied by Wandalee Ferdinand on Wed Jan 13, 2013  3:02 PM ------      Message from: Thornton Papas B      Created: Tue Jan 12, 2013  8:50 PM       Please call patient, ca++ mildly elevated and stable            Add serum light chains to 2/4 labs

## 2013-01-14 ENCOUNTER — Telehealth: Payer: Self-pay | Admitting: Oncology

## 2013-01-14 ENCOUNTER — Telehealth: Payer: Self-pay | Admitting: *Deleted

## 2013-01-14 DIAGNOSIS — C9000 Multiple myeloma not having achieved remission: Secondary | ICD-10-CM

## 2013-01-14 NOTE — Telephone Encounter (Signed)
Talked to pt's daughter gave her appt for lab and MD, 3/11 1st opening for MD

## 2013-01-14 NOTE — Telephone Encounter (Signed)
Message copied by Caleb Popp on Thu Jan 14, 2013  8:42 AM ------      Message from: Thornton Papas B      Created: Tue Jan 12, 2013  8:50 PM       Please call patient, ca++ mildly elevated and stable            Add serum light chains to 2/4 labs

## 2013-01-14 NOTE — Telephone Encounter (Signed)
Light chains were done with 2/4 labs. Orders entered to repeat light chains and CMET in one month, per Dr. Truett Perna. Left message on voicemail for pt's daughter to call office.

## 2013-01-15 ENCOUNTER — Other Ambulatory Visit: Payer: Medicare Other | Admitting: Lab

## 2013-01-25 ENCOUNTER — Encounter: Payer: Self-pay | Admitting: Oncology

## 2013-01-25 NOTE — Progress Notes (Signed)
Pt. daughter called with questions regarding financial assistance program. Explained EPP program. [name] daughter asked if she add [name] on her taxes because she provides him with household supplies such as tooth paste, food etc.. will it harm his discount. Advised typically when patients apply for financial assistance it is based on the total household income. Explained I cannot release Richard [name] information, but can explained the EPP guidelines.

## 2013-02-12 ENCOUNTER — Ambulatory Visit (HOSPITAL_BASED_OUTPATIENT_CLINIC_OR_DEPARTMENT_OTHER): Payer: Medicare Other | Admitting: Lab

## 2013-02-12 DIAGNOSIS — C9000 Multiple myeloma not having achieved remission: Secondary | ICD-10-CM

## 2013-02-12 LAB — COMPREHENSIVE METABOLIC PANEL (CC13)
Alkaline Phosphatase: 64 U/L (ref 40–150)
BUN: 15 mg/dL (ref 7.0–26.0)
Creatinine: 1.2 mg/dL (ref 0.7–1.3)
Glucose: 97 mg/dl (ref 70–99)
Sodium: 139 mEq/L (ref 136–145)
Total Bilirubin: 1.18 mg/dL (ref 0.20–1.20)

## 2013-02-15 LAB — KAPPA/LAMBDA LIGHT CHAINS: Kappa free light chain: 0.98 mg/dL (ref 0.33–1.94)

## 2013-02-16 ENCOUNTER — Telehealth: Payer: Self-pay | Admitting: Oncology

## 2013-02-16 ENCOUNTER — Other Ambulatory Visit: Payer: Medicare Other | Admitting: Lab

## 2013-02-16 ENCOUNTER — Ambulatory Visit (HOSPITAL_BASED_OUTPATIENT_CLINIC_OR_DEPARTMENT_OTHER): Payer: Medicare Other | Admitting: Oncology

## 2013-02-16 VITALS — BP 130/75 | HR 65 | Temp 97.1°F | Resp 20 | Ht 68.0 in | Wt 181.7 lb

## 2013-02-16 DIAGNOSIS — C9000 Multiple myeloma not having achieved remission: Secondary | ICD-10-CM

## 2013-02-16 DIAGNOSIS — D696 Thrombocytopenia, unspecified: Secondary | ICD-10-CM

## 2013-02-16 DIAGNOSIS — I4891 Unspecified atrial fibrillation: Secondary | ICD-10-CM

## 2013-02-16 NOTE — Progress Notes (Signed)
   [facility]    OFFICE PROGRESS NOTE   INTERVAL HISTORY:   He returns as scheduled. No new complaint. He continues to have intermittent is covered at the right anterolateral chest wall.  Objective:  Vital signs in last 24 hours:  Blood pressure 130/75, pulse 65, temperature 97.1 F (36.2 C), temperature source Oral, resp. rate 20, height 5\' 8"  (1.727 m), weight 181 lb 11.2 oz (82.419 kg).    HEENT: Neck without mass Lymphatics: No cervical, supra-clavicular, or axillary nodes Resp: Lungs clear bilaterally Cardio: Regular rate and rhythm GI: No hepatosplenomegaly Vascular: No leg edema  Musculoskeletal: Mild tenderness at the right anterior chest wall lateral to the right breast, no mass  Lab Results:  Lab Results  Component Value Date   WBC 5.6 12/15/2012   HGB 15.5 12/15/2012   HCT 43.1 12/15/2012   MCV 91.5 12/15/2012   PLT 114* 12/15/2012   BUN 15, creatinine 1.2, calcium 10.5 on 02/12/2013 Lambda free light chains 10.0 on 02/12/2013    Medications: I have reviewed the patient's current medications.  Assessment/Plan: 1. Multiple myeloma: The serum free light chains were elevated 08/29/2009. A CT of the chest 08/31/2009 showed a new pleural mass consistent with progression of multiple myeloma. He completed 12 cycles of Revlimid/Decadron. The serum free light chains were improved to 4.34 on 12/29/2009. A restaging CT of the chest 02/19/2010 showed resolution of right axillary lymphadenopathy and a pleural-based mass. The serum free lambda light chains are now mildly elevated. 2. History of back pain, likely related to the pleural-based mass at the right chest, resolved. 3. Right axillary/subpectoral lymphadenopathy on a CT of the chest 08/31/2009, likely related to multiple myeloma, resolved. 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 5. Pancytopenia secondary to Revlimid and multiple myeloma. He has persistent mild  thrombocytopenia. 6. Hypertension. 7. Status post hip replacement. 8. History of back surgery. 9. History of trigeminal neuralgia. 10. Status post removal of a lipoma from the right axilla in February 2010. 11. 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 12. Esophageal reflux disease, followed by Dr. Jearld Fenton.  13. Esophageal stricture, status post an evaluation by Dr. Marina Goodell. 14. Right vocal cord lesion, status post a biopsy by Dr. 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 He completed radiation under the direction of Dr. Dayton Scrape on 03/15/2011. 15. Admission with pneumonia 03/26/2010. 16. Atrial flutter/fibrillation while hospitalized August 2011. He is followed by Dr. Antoine Poche. 17. Elevated prostate specific antigen, status post a biopsy 01/23/2011. He was found to have a Gleason 6 cancer in 10% of the cores. He is followed with an observation approach. He is now followed by Dr. Retta Diones.        18. Mild hypercalcemia-the calcium level has been mildly elevated for the past several months, asymptomatic and stable  Disposition:  He appears stable. The myeloma appears to be slowly progressing. He would like to avoid resuming systemic therapy until after his granddaughter's wedding in July. Mr. Beckers will return for a lab visit and Zometa on 03/15/2013. He is scheduled for office visit and Zometa on 06/16/2013. The plan is to resume treatment with Revlimid/Decadron if he develops progressive hypercalcemia or other clinical evidence of disease progression.   Thornton Papas, MD  02/16/2013  12:49 PM

## 2013-02-17 ENCOUNTER — Other Ambulatory Visit: Payer: Self-pay | Admitting: Dermatology

## 2013-03-15 ENCOUNTER — Other Ambulatory Visit (HOSPITAL_BASED_OUTPATIENT_CLINIC_OR_DEPARTMENT_OTHER): Payer: Medicare Other | Admitting: Lab

## 2013-03-15 ENCOUNTER — Other Ambulatory Visit: Payer: Medicare Other | Admitting: Lab

## 2013-03-15 ENCOUNTER — Ambulatory Visit: Payer: Medicare Other | Admitting: Oncology

## 2013-03-15 ENCOUNTER — Ambulatory Visit (HOSPITAL_BASED_OUTPATIENT_CLINIC_OR_DEPARTMENT_OTHER): Payer: Medicare Other

## 2013-03-15 DIAGNOSIS — C9000 Multiple myeloma not having achieved remission: Secondary | ICD-10-CM

## 2013-03-15 DIAGNOSIS — N138 Other obstructive and reflux uropathy: Secondary | ICD-10-CM

## 2013-03-15 DIAGNOSIS — N401 Enlarged prostate with lower urinary tract symptoms: Secondary | ICD-10-CM

## 2013-03-15 LAB — BASIC METABOLIC PANEL (CC13)
BUN: 16.6 mg/dL (ref 7.0–26.0)
Chloride: 101 mEq/L (ref 98–107)
Creatinine: 1.1 mg/dL (ref 0.7–1.3)
Glucose: 82 mg/dl (ref 70–99)

## 2013-03-15 LAB — CBC WITH DIFFERENTIAL/PLATELET
Basophils Absolute: 0 10*3/uL (ref 0.0–0.1)
EOS%: 1.2 % (ref 0.0–7.0)
LYMPH%: 22.2 % (ref 14.0–49.0)
MCH: 32.5 pg (ref 27.2–33.4)
MCV: 91.9 fL (ref 79.3–98.0)
MONO%: 10 % (ref 0.0–14.0)
Platelets: 103 10*3/uL — ABNORMAL LOW (ref 140–400)
RBC: 4.6 10*6/uL (ref 4.20–5.82)
RDW: 13.6 % (ref 11.0–14.6)

## 2013-03-15 MED ORDER — ZOLEDRONIC ACID 4 MG/100ML IV SOLN
4.0000 mg | Freq: Once | INTRAVENOUS | Status: AC
  Administered 2013-03-15: 4 mg via INTRAVENOUS
  Filled 2013-03-15: qty 100

## 2013-03-15 MED ORDER — SODIUM CHLORIDE 0.9 % IV SOLN
Freq: Once | INTRAVENOUS | Status: AC
  Administered 2013-03-15: 11:00:00 via INTRAVENOUS

## 2013-03-15 NOTE — Patient Instructions (Signed)
Zoledronic Acid injection (Hypercalcemia, Oncology) What is this medicine? ZOLEDRONIC ACID (ZOE le dron ik AS id) lowers the amount of calcium loss from bone. It is used to treat too much calcium in your blood from cancer. It is also used to prevent complications of cancer that has spread to the bone. This medicine may be used for other purposes; ask your health care provider or pharmacist if you have questions. What should I tell my health care provider before I take this medicine? They need to know if you have any of these conditions: -aspirin-sensitive asthma -dental disease -kidney disease -an unusual or allergic reaction to zoledronic acid, other medicines, foods, dyes, or preservatives -pregnant or trying to get pregnant -breast-feeding How should I use this medicine? This medicine is for infusion into a vein. It is given by a health care professional in a hospital or clinic setting. Talk to your pediatrician regarding the use of this medicine in children. Special care may be needed. Overdosage: If you think you have taken too much of this medicine contact a poison control center or emergency room at once. NOTE: This medicine is only for you. Do not share this medicine with others. What if I miss a dose? It is important not to miss your dose. Call your doctor or health care professional if you are unable to keep an appointment. What may interact with this medicine? -certain antibiotics given by injection -NSAIDs, medicines for pain and inflammation, like ibuprofen or naproxen -some diuretics like bumetanide, furosemide -teriparatide -thalidomide This list may not describe all possible interactions. Give your health care provider a list of all the medicines, herbs, non-prescription drugs, or dietary supplements you use. Also tell them if you smoke, drink alcohol, or use illegal drugs. Some items may interact with your medicine. What should I watch for while using this medicine? Visit  your doctor or health care professional for regular checkups. It may be some time before you see the benefit from this medicine. Do not stop taking your medicine unless your doctor tells you to. Your doctor may order blood tests or other tests to see how you are doing. Women should inform their doctor if they wish to become pregnant or think they might be pregnant. There is a potential for serious side effects to an unborn child. Talk to your health care professional or pharmacist for more information. You should make sure that you get enough calcium and vitamin D while you are taking this medicine. Discuss the foods you eat and the vitamins you take with your health care professional. Some people who take this medicine have severe bone, joint, and/or muscle pain. This medicine may also increase your risk for a broken thigh bone. Tell your doctor right away if you have pain in your upper leg or groin. Tell your doctor if you have any pain that does not go away or that gets worse. What side effects may I notice from receiving this medicine? Side effects that you should report to your doctor or health care professional as soon as possible: -allergic reactions like skin rash, itching or hives, swelling of the face, lips, or tongue -anxiety, confusion, or depression -breathing problems -changes in vision -feeling faint or lightheaded, falls -jaw burning, cramping, pain -muscle cramps, stiffness, or weakness -trouble passing urine or change in the amount of urine Side effects that usually do not require medical attention (report to your doctor or health care professional if they continue or are bothersome): -bone, joint, or muscle pain -  fever -hair loss -irritation at site where injected -loss of appetite -nausea, vomiting -stomach upset -tired This list may not describe all possible side effects. Call your doctor for medical advice about side effects. You may report side effects to FDA at  1-800-FDA-1088. Where should I keep my medicine? This drug is given in a hospital or clinic and will not be stored at home. NOTE: This sheet is a summary. It may not cover all possible information. If you have questions about this medicine, talk to your doctor, pharmacist, or health care provider.  2013, [employer]vier/[employer] Standard. (05/24/2011 9:06:58 AM)  

## 2013-03-16 ENCOUNTER — Telehealth: Payer: Self-pay | Admitting: *Deleted

## 2013-03-16 LAB — KAPPA/LAMBDA LIGHT CHAINS
Kappa free light chain: 1.55 mg/dL (ref 0.33–1.94)
Lambda Free Lght Chn: 13.9 mg/dL — ABNORMAL HIGH (ref 0.57–2.63)

## 2013-03-16 NOTE — Telephone Encounter (Signed)
Notified that Bmet is normal.

## 2013-03-16 NOTE — Telephone Encounter (Signed)
Message copied by Wandalee Ferdinand on Tue Mar 16, 2013  6:35 PM ------      Message from: Thornton Papas B      Created: Mon Mar 15, 2013  9:31 PM       Please call patient,calcium is normal ------

## 2013-03-17 ENCOUNTER — Telehealth: Payer: Self-pay | Admitting: *Deleted

## 2013-03-17 DIAGNOSIS — C9000 Multiple myeloma not having achieved remission: Secondary | ICD-10-CM

## 2013-03-17 NOTE — Telephone Encounter (Signed)
Message copied by Caleb Popp on Wed Mar 17, 2013  1:36 PM ------      Message from: Thornton Papas B      Created: Tue Mar 16, 2013  9:26 PM       Please call daughter, calcium is normal, light chains slightly higher, hb/platelets are ok.            MM appears to be slowly progressing.            Repeat same labs 6 weeks ------

## 2013-03-17 NOTE — Telephone Encounter (Signed)
Left message on voicemail for pt/ daughter to call office.

## 2013-03-18 ENCOUNTER — Telehealth: Payer: Self-pay | Admitting: Oncology

## 2013-03-18 NOTE — Telephone Encounter (Signed)
, °

## 2013-04-15 ENCOUNTER — Encounter: Payer: Self-pay | Admitting: Oncology

## 2013-04-15 NOTE — Progress Notes (Signed)
Pt is approved for 55% financial assistance effective 04/15/13 - 10/16/13.  I will mail approval letter and green card today.

## 2013-04-26 ENCOUNTER — Other Ambulatory Visit (HOSPITAL_BASED_OUTPATIENT_CLINIC_OR_DEPARTMENT_OTHER): Payer: Medicare Other | Admitting: Lab

## 2013-04-26 ENCOUNTER — Other Ambulatory Visit: Payer: Medicare Other | Admitting: Lab

## 2013-04-26 DIAGNOSIS — C9000 Multiple myeloma not having achieved remission: Secondary | ICD-10-CM

## 2013-04-26 LAB — CBC WITH DIFFERENTIAL/PLATELET
BASO%: 0.2 % (ref 0.0–2.0)
EOS%: 1.1 % (ref 0.0–7.0)
Eosinophils Absolute: 0.1 10*3/uL (ref 0.0–0.5)
LYMPH%: 22 % (ref 14.0–49.0)
MCH: 32.6 pg (ref 27.2–33.4)
MCHC: 35.3 g/dL (ref 32.0–36.0)
MCV: 92.4 fL (ref 79.3–98.0)
MONO%: 8.5 % (ref 0.0–14.0)
NEUT#: 4.8 10*3/uL (ref 1.5–6.5)
Platelets: 118 10*3/uL — ABNORMAL LOW (ref 140–400)
RBC: 4.75 10*6/uL (ref 4.20–5.82)
RDW: 13.8 % (ref 11.0–14.6)

## 2013-04-26 LAB — BASIC METABOLIC PANEL (CC13)
BUN: 16.8 mg/dL (ref 7.0–26.0)
Chloride: 102 mEq/L (ref 98–107)
Glucose: 116 mg/dl — ABNORMAL HIGH (ref 70–99)
Potassium: 4.3 mEq/L (ref 3.5–5.1)
Sodium: 137 mEq/L (ref 136–145)

## 2013-04-27 LAB — KAPPA/LAMBDA LIGHT CHAINS: Lambda Free Lght Chn: 21.1 mg/dL — ABNORMAL HIGH (ref 0.57–2.63)

## 2013-04-28 ENCOUNTER — Telehealth: Payer: Self-pay | Admitting: Medical Oncology

## 2013-04-28 NOTE — Telephone Encounter (Signed)
Message copied by Charma Igo on Wed Apr 28, 2013  3:37 PM ------      Message from: Wandalee Ferdinand      Created: Wed Apr 28, 2013  3:21 PM                   ----- Message -----         From: Ladene Artist, MD         Sent: 04/27/2013  10:20 PM           To: Wandalee Ferdinand, RN, Glori Luis, RN, #            Please call patient, cbc/calcium are ok, light chains are higher, f/u as scheduled ------

## 2013-04-28 NOTE — Telephone Encounter (Signed)
Patient notified per Dr. Kalman Drape note.

## 2013-04-28 NOTE — Telephone Encounter (Signed)
Message copied by Corky Sing on Wed Apr 28, 2013  3:40 PM ------      Message from: Charma Igo      Created: Wed Apr 28, 2013  3:38 PM                   ----- Message -----         From: Wandalee Ferdinand, RN         Sent: 04/28/2013   3:21 PM           To: Charma Igo, RN                        ----- Message -----         From: Ladene Artist, MD         Sent: 04/27/2013  10:20 PM           To: Wandalee Ferdinand, RN, Glori Luis, RN, #            Please call patient, cbc/calcium are ok, light chains are higher, f/u as scheduled ------

## 2013-05-11 ENCOUNTER — Other Ambulatory Visit: Payer: Self-pay | Admitting: Dermatology

## 2013-06-13 ENCOUNTER — Other Ambulatory Visit: Payer: Self-pay | Admitting: Oncology

## 2013-06-14 ENCOUNTER — Other Ambulatory Visit (HOSPITAL_BASED_OUTPATIENT_CLINIC_OR_DEPARTMENT_OTHER): Payer: Medicare Other | Admitting: Lab

## 2013-06-14 DIAGNOSIS — C9000 Multiple myeloma not having achieved remission: Secondary | ICD-10-CM

## 2013-06-14 LAB — CBC WITH DIFFERENTIAL/PLATELET
Basophils Absolute: 0 10*3/uL (ref 0.0–0.1)
Eosinophils Absolute: 0.1 10*3/uL (ref 0.0–0.5)
HGB: 15.4 g/dL (ref 13.0–17.1)
MCV: 91.8 fL (ref 79.3–98.0)
MONO#: 0.4 10*3/uL (ref 0.1–0.9)
MONO%: 8.4 % (ref 0.0–14.0)
NEUT#: 3.2 10*3/uL (ref 1.5–6.5)
RDW: 13.9 % (ref 11.0–14.6)
WBC: 5.2 10*3/uL (ref 4.0–10.3)
lymph#: 1.4 10*3/uL (ref 0.9–3.3)

## 2013-06-14 LAB — BASIC METABOLIC PANEL (CC13)
BUN: 18.5 mg/dL (ref 7.0–26.0)
CO2: 26 mEq/L (ref 22–29)
Chloride: 102 mEq/L (ref 98–109)
Glucose: 104 mg/dl (ref 70–140)
Potassium: 3.8 mEq/L (ref 3.5–5.1)
Sodium: 137 mEq/L (ref 136–145)

## 2013-06-15 LAB — KAPPA/LAMBDA LIGHT CHAINS: Lambda Free Lght Chn: 24.8 mg/dL — ABNORMAL HIGH (ref 0.57–2.63)

## 2013-06-16 ENCOUNTER — Ambulatory Visit (HOSPITAL_BASED_OUTPATIENT_CLINIC_OR_DEPARTMENT_OTHER): Payer: Medicare Other | Admitting: Oncology

## 2013-06-16 ENCOUNTER — Telehealth: Payer: Self-pay | Admitting: *Deleted

## 2013-06-16 ENCOUNTER — Telehealth: Payer: Self-pay | Admitting: Oncology

## 2013-06-16 ENCOUNTER — Ambulatory Visit (HOSPITAL_BASED_OUTPATIENT_CLINIC_OR_DEPARTMENT_OTHER): Payer: Medicare Other

## 2013-06-16 ENCOUNTER — Other Ambulatory Visit: Payer: Medicare Other | Admitting: Lab

## 2013-06-16 VITALS — BP 136/78 | HR 77 | Temp 97.9°F | Resp 18 | Ht 68.0 in | Wt 185.0 lb

## 2013-06-16 DIAGNOSIS — C9000 Multiple myeloma not having achieved remission: Secondary | ICD-10-CM

## 2013-06-16 MED ORDER — HEPARIN SOD (PORK) LOCK FLUSH 100 UNIT/ML IV SOLN
500.0000 [IU] | Freq: Once | INTRAVENOUS | Status: DC | PRN
  Filled 2013-06-16: qty 5

## 2013-06-16 MED ORDER — ZOLEDRONIC ACID 4 MG/100ML IV SOLN
4.0000 mg | Freq: Once | INTRAVENOUS | Status: AC
  Administered 2013-06-16: 4 mg via INTRAVENOUS
  Filled 2013-06-16: qty 100

## 2013-06-16 MED ORDER — ALTEPLASE 2 MG IJ SOLR
2.0000 mg | Freq: Once | INTRAMUSCULAR | Status: DC | PRN
  Filled 2013-06-16: qty 2

## 2013-06-16 MED ORDER — SODIUM CHLORIDE 0.9 % IV SOLN
Freq: Once | INTRAVENOUS | Status: AC
  Administered 2013-06-16: 10:00:00 via INTRAVENOUS

## 2013-06-16 NOTE — Patient Instructions (Signed)
Zoledronic Acid injection (Hypercalcemia, Oncology) What is this medicine? ZOLEDRONIC ACID (ZOE le dron ik AS id) lowers the amount of calcium loss from bone. It is used to treat too much calcium in your blood from cancer. It is also used to prevent complications of cancer that has spread to the bone. This medicine may be used for other purposes; ask your health care provider or pharmacist if you have questions. What should I tell my health care provider before I take this medicine? They need to know if you have any of these conditions: -aspirin-sensitive asthma -dental disease -kidney disease -an unusual or allergic reaction to zoledronic acid, other medicines, foods, dyes, or preservatives -pregnant or trying to get pregnant -breast-feeding How should I use this medicine? This medicine is for infusion into a vein. It is given by a health care professional in a hospital or clinic setting. Talk to your pediatrician regarding the use of this medicine in children. Special care may be needed. Overdosage: If you think you have taken too much of this medicine contact a poison control center or emergency room at once. NOTE: This medicine is only for you. Do not share this medicine with others. What if I miss a dose? It is important not to miss your dose. Call your doctor or health care professional if you are unable to keep an appointment. What may interact with this medicine? -certain antibiotics given by injection -NSAIDs, medicines for pain and inflammation, like ibuprofen or naproxen -some diuretics like bumetanide, furosemide -teriparatide -thalidomide This list may not describe all possible interactions. Give your health care provider a list of all the medicines, herbs, non-prescription drugs, or dietary supplements you use. Also tell them if you smoke, drink alcohol, or use illegal drugs. Some items may interact with your medicine. What should I watch for while using this medicine? Visit  your doctor or health care professional for regular checkups. It may be some time before you see the benefit from this medicine. Do not stop taking your medicine unless your doctor tells you to. Your doctor may order blood tests or other tests to see how you are doing. Women should inform their doctor if they wish to become pregnant or think they might be pregnant. There is a potential for serious side effects to an unborn child. Talk to your health care professional or pharmacist for more information. You should make sure that you get enough calcium and vitamin D while you are taking this medicine. Discuss the foods you eat and the vitamins you take with your health care professional. Some people who take this medicine have severe bone, joint, and/or muscle pain. This medicine may also increase your risk for a broken thigh bone. Tell your doctor right away if you have pain in your upper leg or groin. Tell your doctor if you have any pain that does not go away or that gets worse. What side effects may I notice from receiving this medicine? Side effects that you should report to your doctor or health care professional as soon as possible: -allergic reactions like skin rash, itching or hives, swelling of the face, lips, or tongue -anxiety, confusion, or depression -breathing problems -changes in vision -feeling faint or lightheaded, falls -jaw burning, cramping, pain -muscle cramps, stiffness, or weakness -trouble passing urine or change in the amount of urine Side effects that usually do not require medical attention (report to your doctor or health care professional if they continue or are bothersome): -bone, joint, or muscle pain -  fever -hair loss -irritation at site where injected -loss of appetite -nausea, vomiting -stomach upset -tired This list may not describe all possible side effects. Call your doctor for medical advice about side effects. You may report side effects to FDA at  1-800-FDA-1088. Where should I keep my medicine? This drug is given in a hospital or clinic and will not be stored at home. NOTE: This sheet is a summary. It may not cover all possible information. If you have questions about this medicine, talk to your doctor, pharmacist, or health care provider.  2013, [employer]vier/[employer] Standard. (05/24/2011 9:06:58 AM)  

## 2013-06-16 NOTE — Progress Notes (Signed)
   [facility]    OFFICE PROGRESS NOTE   INTERVAL HISTORY:   He returns as scheduled. He feels well. Good appetite and energy level. He plans a trip to [place] next week.  Objective:  Vital signs in last 24 hours:  Blood pressure 136/78, pulse 77, temperature 97.9 F (36.6 C), temperature source Oral, resp. rate 18, height 5\' 8"  (1.727 m), weight 185 lb (83.915 kg).    Lymphatics: no cervical, supraclavicular, or right axillary nodes,? 1 cm left axillary node Resp: lungs clear bilaterally Cardio: regular rate and rhythm GI: no hepatosplenomegaly Vascular: no leg edema   Lab Results:  Lab Results  Component Value Date   WBC 5.2 06/14/2013   HGB 15.4 06/14/2013   HCT 43.6 06/14/2013   MCV 91.8 06/14/2013   PLT 128* 06/14/2013  ANC 3.2  06/14/2013-BUN 18.5, creatinine 1.2, calcium 10.6, lambda free light chains 24.8  Medications: I have reviewed the patient's current medications.  Assessment/Plan: 1. Multiple myeloma: The serum free light chains were elevated 08/29/2009. A CT of the chest 08/31/2009 showed a new pleural mass consistent with progression of multiple myeloma. He completed 12 cycles of Revlimid/Decadron. The serum free light chains were improved to 4.34 on 12/29/2009. A restaging CT of the chest 02/19/2010 showed resolution of right axillary lymphadenopathy and a pleural-based mass. The serum free lambda light chains are now mildly elevated. 2. History of back pain, likely related to the pleural-based mass at the right chest, resolved. 3. Right axillary/subpectoral lymphadenopathy on a CT of the chest 08/31/2009, likely related to multiple myeloma, resolved. 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 5. Pancytopenia secondary to Revlimid and multiple myeloma. He has persistent mild thrombocytopenia. 6. Hypertension. 7. Status post hip replacement. 8. History of back surgery. 9. History of trigeminal neuralgia. 10. Status post  removal of a lipoma from the right axilla in February 2010. 11. 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 12. Esophageal reflux disease, followed by Dr. Jearld Fenton.  13. Esophageal stricture, status post an evaluation by Dr. Marina Goodell. 14. Right vocal cord lesion, status post a biopsy by Dr. 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 He completed radiation under the direction of Dr. Dayton Scrape on 03/15/2011. 15. Admission with pneumonia 03/26/2010. 16. Atrial flutter/fibrillation while hospitalized August 2011. He is followed by Dr. Antoine Poche. 17. Elevated prostate specific antigen, status post a biopsy 01/23/2011. He was found to have a Gleason 6 cancer in 10% of the cores. He is followed with an observation approach. He is now followed by Dr. Retta Diones.  18. Mild hypercalcemia-the calcium level is mildly elevated, asymptomatic   Disposition:  He remains asymptomatic from the myeloma. We decided to continue an observation approach for now. He knows to contact us for symptoms of hypercalcemia-these were reviewed today. He will receive Zometa today. The plan is to continue every three-month Zometa. Mr. Petrosian will return for a lab visit in one month. He is scheduled for a 3 month office visit. A metastatic bone survey will be scheduled for November of 2014.   Thornton Papas, MD  06/16/2013  3:51 PM

## 2013-06-16 NOTE — Telephone Encounter (Signed)
Per staff phone call and POF I have schedueld appts.  JMW  

## 2013-06-16 NOTE — Telephone Encounter (Signed)
gave pt appt for lab, MD and Zometa for October and lab in August 2014

## 2013-06-22 ENCOUNTER — Other Ambulatory Visit: Payer: Self-pay

## 2013-06-22 MED ORDER — OLMESARTAN MEDOXOMIL-HCTZ 20-12.5 MG PO TABS: ORAL_TABLET | ORAL | Status: DC

## 2013-07-13 ENCOUNTER — Ambulatory Visit
Admission: RE | Admit: 2013-07-13 | Discharge: 2013-07-13 | Disposition: A | Discharge status: Home / Self Care | Payer: Medicare Other | Source: Ambulatory Visit | Attending: Radiation Oncology | Admitting: Radiation Oncology

## 2013-07-13 ENCOUNTER — Other Ambulatory Visit: Payer: Medicare Other | Admitting: Lab

## 2013-07-13 ENCOUNTER — Other Ambulatory Visit (HOSPITAL_BASED_OUTPATIENT_CLINIC_OR_DEPARTMENT_OTHER): Payer: Medicare Other | Admitting: Lab

## 2013-07-13 VITALS — BP 133/72 | HR 84 | Temp 98.5°F | Ht 68.0 in | Wt 180.1 lb

## 2013-07-13 DIAGNOSIS — C320 Malignant neoplasm of glottis: Secondary | ICD-10-CM

## 2013-07-13 DIAGNOSIS — C9000 Multiple myeloma not having achieved remission: Secondary | ICD-10-CM

## 2013-07-13 LAB — CBC WITH DIFFERENTIAL/PLATELET
BASO%: 0.1 % (ref 0.0–2.0)
EOS%: 1.8 % (ref 0.0–7.0)
HGB: 14.5 g/dL (ref 13.0–17.1)
MCH: 32.4 pg (ref 27.2–33.4)
MCHC: 35.6 g/dL (ref 32.0–36.0)
RDW: 13.8 % (ref 11.0–14.6)
lymph#: 1.3 10*3/uL (ref 0.9–3.3)

## 2013-07-13 LAB — COMPREHENSIVE METABOLIC PANEL (CC13)
AST: 17 U/L (ref 5–34)
Albumin: 4 g/dL (ref 3.5–5.0)
Alkaline Phosphatase: 56 U/L (ref 40–150)
BUN: 16.1 mg/dL (ref 7.0–26.0)
Glucose: 99 mg/dl (ref 70–140)
Potassium: 3.8 mEq/L (ref 3.5–5.1)
Sodium: 138 mEq/L (ref 136–145)
Total Bilirubin: 1.1 mg/dL (ref 0.20–1.20)

## 2013-07-13 NOTE — Progress Notes (Signed)
Stephane Reynold here for follow up after treatment to his right true vocal cord.  He denies pain except for some soreness in his right hip.  He denies a dry mouth, hearing loss and trouble swallowing.  He does have a red, small sore on the roof of his mouth.  He reports that his voice and taste buds are back to normal.  He does report having swallowed some corn last weekend and then coughing up some 12 hours later.  He said he was eating fast and this has happened to him before.

## 2013-07-13 NOTE — Progress Notes (Signed)
CC: Dr. 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 He is without complaints today. His voice is somewhat raspy and remains unchanged. He is being followed by Richard [name] for favorable risk prostate cancer and Richard [name] for his myeloma which is apparently stable or in remission off therapy.  Physical examination: Alert and oriented. Wt Readings from Last 3 Encounters:  07/13/13 180 lb 1.6 oz (81.693 kg)  06/16/13 185 lb (83.915 kg)  02/16/13 181 lb 11.2 oz (82.419 kg)   Temp Readings from Last 3 Encounters:  07/13/13 98.5 F (36.9 C)   06/16/13 97.9 F (36.6 C) Oral  02/16/13 97.1 F (36.2 C) Oral   BP Readings from Last 3 Encounters:  07/13/13 133/72  06/16/13 136/78  02/16/13 130/75   Pulse Readings from Last 3 Encounters:  07/13/13 84  06/16/13 77  02/16/13 65   Head and neck examination: Nodes: Without palpable lymphadenopathy. Oral cavity and oropharynx unremarkable to inspection. There is surgical absence of the uvula. Indirect mirror examination is without evidence for recurrent disease. Both true vocal cords move well.  Impression: No evidence for recurrent vocal cord cancer.  Plan: He'll see Richard [name], and if Richard [name] sees him 5 months later we can alternate one of his seeing him every 6 months. Therefore, I will schedule a one year followup visit with me.

## 2013-07-14 LAB — KAPPA/LAMBDA LIGHT CHAINS
Kappa:Lambda Ratio: 0.05 — ABNORMAL LOW (ref 0.26–1.65)
Lambda Free Lght Chn: 28.9 mg/dL — ABNORMAL HIGH (ref 0.57–2.63)

## 2013-07-15 ENCOUNTER — Telehealth: Payer: Self-pay | Admitting: *Deleted

## 2013-07-15 NOTE — Telephone Encounter (Signed)
Notified pt's daughter labs ok, f/u as schedule.  [name] daughter [name]) verbalized understanding.

## 2013-07-15 NOTE — Telephone Encounter (Signed)
Message copied by Gala Romney on Thu Jul 15, 2013  4:13 PM ------      Message from: Thornton Papas B      Created: Wed Jul 14, 2013  7:57 PM       Please call patient, labs ok, f/u as scheduled ------

## 2013-09-06 ENCOUNTER — Other Ambulatory Visit (HOSPITAL_BASED_OUTPATIENT_CLINIC_OR_DEPARTMENT_OTHER): Payer: Medicare Other | Admitting: Lab

## 2013-09-06 DIAGNOSIS — D6959 Other secondary thrombocytopenia: Secondary | ICD-10-CM

## 2013-09-06 DIAGNOSIS — C9000 Multiple myeloma not having achieved remission: Secondary | ICD-10-CM

## 2013-09-06 LAB — CBC WITH DIFFERENTIAL/PLATELET
BASO%: 0.2 % (ref 0.0–2.0)
EOS%: 2.3 % (ref 0.0–7.0)
HCT: 42.2 % (ref 38.4–49.9)
MCH: 32.5 pg (ref 27.2–33.4)
MCHC: 35 g/dL (ref 32.0–36.0)
MONO#: 0.6 10*3/uL (ref 0.1–0.9)
NEUT%: 61.6 % (ref 39.0–75.0)
RBC: 4.54 10*6/uL (ref 4.20–5.82)
RDW: 13.9 % (ref 11.0–14.6)
WBC: 6.2 10*3/uL (ref 4.0–10.3)
lymph#: 1.6 10*3/uL (ref 0.9–3.3)

## 2013-09-06 LAB — BASIC METABOLIC PANEL (CC13)
CO2: 24 mEq/L (ref 22–29)
Chloride: 103 mEq/L (ref 98–109)
Creatinine: 1.3 mg/dL (ref 0.7–1.3)
Potassium: 4 mEq/L (ref 3.5–5.1)
Sodium: 138 mEq/L (ref 136–145)

## 2013-09-07 LAB — KAPPA/LAMBDA LIGHT CHAINS
Kappa free light chain: 1.43 mg/dL (ref 0.33–1.94)
Kappa:Lambda Ratio: 0.03 — ABNORMAL LOW (ref 0.26–1.65)
Lambda Free Lght Chn: 47.1 mg/dL — ABNORMAL HIGH (ref 0.57–2.63)

## 2013-09-08 ENCOUNTER — Ambulatory Visit (HOSPITAL_BASED_OUTPATIENT_CLINIC_OR_DEPARTMENT_OTHER): Payer: Medicare Other

## 2013-09-08 ENCOUNTER — Ambulatory Visit: Payer: Medicare Other

## 2013-09-08 ENCOUNTER — Other Ambulatory Visit: Payer: Medicare Other | Admitting: Lab

## 2013-09-08 ENCOUNTER — Ambulatory Visit (HOSPITAL_BASED_OUTPATIENT_CLINIC_OR_DEPARTMENT_OTHER): Payer: Medicare Other | Admitting: Oncology

## 2013-09-08 ENCOUNTER — Telehealth: Payer: Self-pay | Admitting: Oncology

## 2013-09-08 ENCOUNTER — Ambulatory Visit (HOSPITAL_COMMUNITY)
Admission: RE | Admit: 2013-09-08 | Discharge: 2013-09-08 | Disposition: A | Discharge status: Home / Self Care | Payer: Medicare Other | Source: Ambulatory Visit | Attending: Oncology | Admitting: Oncology

## 2013-09-08 VITALS — BP 139/72 | HR 83 | Temp 98.7°F | Resp 18 | Ht 68.0 in | Wt 185.5 lb

## 2013-09-08 DIAGNOSIS — D61818 Other pancytopenia: Secondary | ICD-10-CM

## 2013-09-08 DIAGNOSIS — D801 Nonfamilial hypogammaglobulinemia: Secondary | ICD-10-CM

## 2013-09-08 DIAGNOSIS — M5030 Other cervical disc degeneration, unspecified cervical region: Secondary | ICD-10-CM | POA: Insufficient documentation

## 2013-09-08 DIAGNOSIS — Z23 Encounter for immunization: Secondary | ICD-10-CM

## 2013-09-08 DIAGNOSIS — D696 Thrombocytopenia, unspecified: Secondary | ICD-10-CM

## 2013-09-08 DIAGNOSIS — C9000 Multiple myeloma not having achieved remission: Secondary | ICD-10-CM

## 2013-09-08 DIAGNOSIS — M8448XA Pathological fracture, other site, initial encounter for fracture: Secondary | ICD-10-CM | POA: Insufficient documentation

## 2013-09-08 MED ORDER — INFLUENZA VAC SPLIT QUAD 0.5 ML IM SUSP
0.5000 mL | Freq: Once | INTRAMUSCULAR | Status: AC
  Administered 2013-09-08: 0.5 mL via INTRAMUSCULAR
  Filled 2013-09-08: qty 0.5

## 2013-09-08 MED ORDER — ZOLEDRONIC ACID 4 MG/100ML IV SOLN
4.0000 mg | Freq: Once | INTRAVENOUS | Status: AC
  Administered 2013-09-08: 4 mg via INTRAVENOUS
  Filled 2013-09-08: qty 100

## 2013-09-08 NOTE — Patient Instructions (Addendum)
Zoledronic Acid injection (Hypercalcemia, Oncology) What is this medicine? ZOLEDRONIC ACID (ZOE le dron ik AS id) lowers the amount of calcium loss from bone. It is used to treat too much calcium in your blood from cancer. It is also used to prevent complications of cancer that has spread to the bone. This medicine may be used for other purposes; ask your health care provider or pharmacist if you have questions. What should I tell my health care provider before I take this medicine? They need to know if you have any of these conditions: -aspirin-sensitive asthma -dental disease -kidney disease -an unusual or allergic reaction to zoledronic acid, other medicines, foods, dyes, or preservatives -pregnant or trying to get pregnant -breast-feeding How should I use this medicine? This medicine is for infusion into a vein. It is given by a health care professional in a hospital or clinic setting. Talk to your pediatrician regarding the use of this medicine in children. Special care may be needed. Overdosage: If you think you have taken too much of this medicine contact a poison control center or emergency room at once. NOTE: This medicine is only for you. Do not share this medicine with others. What if I miss a dose? It is important not to miss your dose. Call your doctor or health care professional if you are unable to keep an appointment. What may interact with this medicine? -certain antibiotics given by injection -NSAIDs, medicines for pain and inflammation, like ibuprofen or naproxen -some diuretics like bumetanide, furosemide -teriparatide -thalidomide This list may not describe all possible interactions. Give your health care provider a list of all the medicines, herbs, non-prescription drugs, or dietary supplements you use. Also tell them if you smoke, drink alcohol, or use illegal drugs. Some items may interact with your medicine. What should I watch for while using this medicine? Visit  your doctor or health care professional for regular checkups. It may be some time before you see the benefit from this medicine. Do not stop taking your medicine unless your doctor tells you to. Your doctor may order blood tests or other tests to see how you are doing. Women should inform their doctor if they wish to become pregnant or think they might be pregnant. There is a potential for serious side effects to an unborn child. Talk to your health care professional or pharmacist for more information. You should make sure that you get enough calcium and vitamin D while you are taking this medicine. Discuss the foods you eat and the vitamins you take with your health care professional. Some people who take this medicine have severe bone, joint, and/or muscle pain. This medicine may also increase your risk for a broken thigh bone. Tell your doctor right away if you have pain in your upper leg or groin. Tell your doctor if you have any pain that does not go away or that gets worse. What side effects may I notice from receiving this medicine? Side effects that you should report to your doctor or health care professional as soon as possible: -allergic reactions like skin rash, itching or hives, swelling of the face, lips, or tongue -anxiety, confusion, or depression -breathing problems -changes in vision -feeling faint or lightheaded, falls -jaw burning, cramping, pain -muscle cramps, stiffness, or weakness -trouble passing urine or change in the amount of urine Side effects that usually do not require medical attention (report to your doctor or health care professional if they continue or are bothersome): -bone, joint, or muscle pain -  fever -hair loss -irritation at site where injected -loss of appetite -nausea, vomiting -stomach upset -tired This list may not describe all possible side effects. Call your doctor for medical advice about side effects. You may report side effects to FDA at  1-800-FDA-1088. Where should I keep my medicine? This drug is given in a hospital or clinic and will not be stored at home. NOTE: This sheet is a summary. It may not cover all possible information. If you have questions about this medicine, talk to your doctor, pharmacist, or health care provider.  2012, [employer]vier/Gold Standard. (05/24/2011 9:06:58 AM) 

## 2013-09-08 NOTE — Telephone Encounter (Signed)
gv and printed appt sched and av sfo rpt for OCT and NOV.... °

## 2013-09-08 NOTE — Progress Notes (Signed)
   [facility]    OFFICE PROGRESS NOTE   INTERVAL HISTORY:   [name] returns for followup of multiple myeloma. He feels well. Good appetite and energy level. He reports "night sweats "approximately once every 2 weeks. No fever. Intermittent pain at the right "hip ".  Objective:  Vital signs in last 24 hours:  Blood pressure 139/72, pulse 83, temperature 98.7 F (37.1 C), temperature source Oral, resp. rate 18, height 5\' 8"  (1.727 m), weight 185 lb 8 oz (84.142 kg).   Lymphatics: no right axillary or inguinal nodes Resp: lungs clear bilaterally Cardio: regular rate and rhythm GI: no hepatosplenomegaly Vascular: no leg edema Musculoskeletal: Full range of motion at the right hip without pain.    Portacath/PICC-without erythema  Lab Results:  Lab Results  Component Value Date   WBC 6.2 09/06/2013   HGB 14.8 09/06/2013   HCT 42.2 09/06/2013   MCV 92.9 09/06/2013   PLT 110* 09/06/2013  ANC 3.8  Potassium 4.0, creatinine 1.3, calcium 10.5  Free lambda light chains 47.1  Medications: I have reviewed the patient's current medications.  Assessment/Plan: 1. Multiple myeloma: The serum free light chains were elevated 08/29/2009. A CT of the chest 08/31/2009 showed a new pleural mass consistent with progression of multiple myeloma. He completed 12 cycles of Revlimid/Decadron. The serum free light chains were improved to 4.34 on 12/29/2009. A restaging CT of the chest 02/19/2010 showed resolution of right axillary lymphadenopathy and a pleural-based mass. The serum free lambda light chains are now elevated. 2. History of back pain, likely related to the pleural-based mass at the right chest, resolved. 3. Right axillary/subpectoral lymphadenopathy on a CT of the chest 08/31/2009, likely related to multiple myeloma, resolved. 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 5. Pancytopenia secondary to Revlimid and multiple myeloma. He has persistent  mild thrombocytopenia. 6. Hypertension. 7. Status post hip replacement. 8. History of back surgery. 9. History of trigeminal neuralgia. 10. Status post removal of a lipoma from the right axilla in February 2010. 11. 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 12. Esophageal reflux disease, followed by Dr. Jearld Fenton.  13. Esophageal stricture, status post an evaluation by Dr. Marina Goodell. 14. Right vocal cord lesion, status post a biopsy by Dr. 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 He completed radiation under the direction of Dr. Dayton Scrape on 03/15/2011. 15. Admission with pneumonia 03/26/2010. 16. Atrial flutter/fibrillation while hospitalized August 2011. He is followed by Dr. Antoine Poche. 17. Elevated prostate specific antigen, status post a biopsy 01/23/2011. He was found to have a Gleason 6 cancer in 10% of the cores. He is followed with an observation approach. He is now followed by Dr. Retta Diones.  18. Mild hypercalcemia-the calcium level is mildly elevated, asymptomatic   Disposition:  His overall status is unchanged. The intermittent night sweats could be related to multiple myeloma. The serum free light chains are rising.  We decided to continue an observation approach for now. He will return for an office visit and repeat laboratory evaluation in 6 weeks. He will be referred for a  bone survey today.he continues every three-month Zometa. Mr. Marrs received an influenza vaccine today.   Thornton Papas, MD  09/08/2013  8:09 PM

## 2013-09-10 ENCOUNTER — Telehealth: Payer: Self-pay | Admitting: *Deleted

## 2013-09-10 NOTE — Telephone Encounter (Signed)
Message copied by Wandalee Ferdinand on Fri Sep 10, 2013  3:04 PM ------      Message from: Thornton Papas B      Created: Wed Sep 08, 2013  9:30 PM       Please call patient,bone survey is unchanged ------

## 2013-09-10 NOTE — Telephone Encounter (Signed)
Notified daughter via VM that bone survey is stable-nothing new.

## 2013-09-13 ENCOUNTER — Telehealth: Payer: Self-pay | Admitting: *Deleted

## 2013-09-13 NOTE — Telephone Encounter (Signed)
Message copied by Caleb Popp on Mon Sep 13, 2013  2:15 PM ------      Message from: Thornton Papas B      Created: Wed Sep 08, 2013  9:30 PM       Please call patient,bone survey is unchanged ------

## 2013-09-22 ENCOUNTER — Other Ambulatory Visit: Payer: Self-pay | Admitting: Cardiology

## 2013-09-22 ENCOUNTER — Other Ambulatory Visit: Payer: Self-pay | Admitting: Dermatology

## 2013-10-11 ENCOUNTER — Ambulatory Visit: Payer: Medicare Other | Admitting: Cardiology

## 2013-10-11 ENCOUNTER — Encounter: Payer: Self-pay | Admitting: Cardiology

## 2013-10-11 ENCOUNTER — Ambulatory Visit (INDEPENDENT_AMBULATORY_CARE_PROVIDER_SITE_OTHER): Payer: Medicare Other | Admitting: Cardiology

## 2013-10-11 VITALS — BP 140/76 | HR 73 | Ht 68.0 in | Wt 185.0 lb

## 2013-10-11 DIAGNOSIS — I4891 Unspecified atrial fibrillation: Secondary | ICD-10-CM

## 2013-10-11 MED ORDER — OLMESARTAN MEDOXOMIL-HCTZ 20-12.5 MG PO TABS: 1.0000 | ORAL_TABLET | Freq: Every day | ORAL | Status: DC

## 2013-10-11 NOTE — Progress Notes (Signed)
HPI The patient presents for follow up of atrial fibrillation. He had this one time when he was acutely ill. However, he's had no recurrence of it and has been off anticoagulation.  He denies any palpitations, presyncope or syncope. He is active exercising routinely. With this he denies any shortness of breath, neck discomfort, arm discomfort or chest pain. He has no PND or orthopnea. He's had no weight gain or lower extremity swelling.  He is limited in his activities because of some pain in his joints.   Allergies  Allergen Reactions  . Celebrex [Celecoxib] Other (See Comments)    GI bleeding    Current Outpatient Prescriptions  Medication Sig Dispense Refill  . aspirin 81 MG tablet Take 81 mg by mouth daily.        Marland Kitchen BENICAR HCT 20-12.5 MG per tablet TAKE ONE TABLET BY MOUTH ONCE DAILY **NEED  OFFICE  VISIT  FOR  FURTHER  REFILLS**  30 tablet  0  . esomeprazole (NEXIUM) 40 MG capsule Take 40 mg by mouth as needed.       . Multiple Vitamin (MULTIVITAMIN) capsule Take 1 capsule by mouth daily.        . saw palmetto 160 MG capsule Take 160 mg by mouth daily. Three pills once per day      . Tamsulosin HCl (FLOMAX) 0.4 MG CAPS Take by mouth daily.       . vitamin B-12 (CYANOCOBALAMIN) 100 MCG tablet Take 100 mcg by mouth daily.         No current facility-administered medications for this visit.    Past Medical History  Diagnosis Date  . Multiple myeloma   . CAD (coronary artery disease)   . Hypertension   . Dyslipidemia   . Arrhythmia     atrial fibrillation  . Cholelithiasis   . Aortic aneurysm   . GERD (gastroesophageal reflux disease)   . Hiatal hernia   . Esophageal stricture   . Anemia   . History of BPH   . History of radiation therapy 01/29/11 -03/15/11    right true vocal cord  . Cancer of true vocal cord 01/10/2011    right  . Prostate cancer 01/23/11    Gleason 3+3=6  . Arthritis     Past Surgical History  Procedure Laterality Date  . Hip surgery    . Back  surgery    . Trigeminal neuralgia    . Appendectomy    . Tonsillectomy    . Right chest wall resection      of second ,third,and fourth rib  . Esophageal dilation      ROS:  As stated in the HPI and negative for all other systems.  PHYSICAL EXAM BP 140/76  Pulse 73  Ht 5\' 8"  (1.727 m)  Wt 185 lb (83.915 kg)  BMI 28.14 kg/m2  SpO2 97% GENERAL:  Well appearing HEENT:  Pupils equal round and reactive, fundi not visualized, oral mucosa unremarkable NECK:  No jugular venous distention, waveform within normal limits, carotid upstroke brisk and symmetric, no bruits, no thyromegaly LYMPHATICS:  No cervical, inguinal adenopathy LUNGS:  Clear to auscultation bilaterally BACK:  No CVA tenderness CHEST:  Large right posterior thoracotomy scar HEART:  PMI not displaced or sustained,S1 and S2 within normal limits, no S3, no S4, no clicks, no rubs, no murmurs ABD:  Flat, positive bowel sounds normal in frequency in pitch, no bruits, no rebound, no guarding, no midline pulsatile mass, no hepatomegaly, no splenomegaly EXT:  2 plus pulses throughout, no edema, no cyanosis no clubbing SKIN:  No rashes no nodules NEURO:  Cranial nerves II through XII grossly intact, motor grossly intact throughout [facility]:  Cognitively intact, oriented to person place and time  EKG;  Sinus rhythm, rate 71, left anterior fascicular block, early transition,right bundle branch block, no acute ST-T wave  10/11/2013  ASSESSMENT AND PLAN   ATRIAL FIBRILLATION:  He has had no recurrence of this in years. This was only documented at the time of acute illness. Therefore, no further management or change in therapy is indicated. We would certainly reconsider anticoagulation if we ever see in the future.  AAA:  I reviewed the most recent abdominal ultrasound and he had normal caliber aorta. I removed this from his problem list.  ABNORMAL ECHO: the last echo did not suggest any pulmonary hypertension though the EF was low  limits of normal. He has no symptoms. No further evaluation is indicated.

## 2013-10-11 NOTE — Patient Instructions (Signed)
The current medical regimen is effective;  continue present plan and medications.  Follow up as needed 

## 2013-10-18 ENCOUNTER — Other Ambulatory Visit: Payer: Medicare Other | Admitting: Lab

## 2013-10-18 DIAGNOSIS — C9000 Multiple myeloma not having achieved remission: Secondary | ICD-10-CM

## 2013-10-18 LAB — CBC WITH DIFFERENTIAL/PLATELET
Basophils Absolute: 0 10*3/uL (ref 0.0–0.1)
Eosinophils Absolute: 0.1 10*3/uL (ref 0.0–0.5)
HCT: 41.5 % (ref 38.4–49.9)
HGB: 14.3 g/dL (ref 13.0–17.1)
LYMPH%: 32 % (ref 14.0–49.0)
MONO#: 0.5 10*3/uL (ref 0.1–0.9)
MONO%: 8.7 % (ref 0.0–14.0)
NEUT#: 3.3 10*3/uL (ref 1.5–6.5)
NEUT%: 57.1 % (ref 39.0–75.0)
Platelets: 110 10*3/uL — ABNORMAL LOW (ref 140–400)
RBC: 4.41 10*6/uL (ref 4.20–5.82)
WBC: 5.8 10*3/uL (ref 4.0–10.3)
lymph#: 1.9 10*3/uL (ref 0.9–3.3)

## 2013-10-18 LAB — COMPREHENSIVE METABOLIC PANEL (CC13)
ALT: 30 U/L (ref 0–55)
Albumin: 4.2 g/dL (ref 3.5–5.0)
Alkaline Phosphatase: 67 U/L (ref 40–150)
CO2: 24 mEq/L (ref 22–29)
Calcium: 10.3 mg/dL (ref 8.4–10.4)
Chloride: 103 mEq/L (ref 98–109)
Glucose: 99 mg/dl (ref 70–140)
Potassium: 3.8 mEq/L (ref 3.5–5.1)
Sodium: 138 mEq/L (ref 136–145)
Total Bilirubin: 0.64 mg/dL (ref 0.20–1.20)
Total Protein: 6.9 g/dL (ref 6.4–8.3)

## 2013-10-19 LAB — KAPPA/LAMBDA LIGHT CHAINS
Kappa free light chain: 1.27 mg/dL (ref 0.33–1.94)
Kappa:Lambda Ratio: 0.02 — ABNORMAL LOW (ref 0.26–1.65)

## 2013-10-22 ENCOUNTER — Ambulatory Visit (HOSPITAL_BASED_OUTPATIENT_CLINIC_OR_DEPARTMENT_OTHER): Payer: Medicare Other | Admitting: Oncology

## 2013-10-22 ENCOUNTER — Telehealth: Payer: Self-pay | Admitting: Oncology

## 2013-10-22 VITALS — BP 153/80 | HR 71 | Temp 97.6°F | Ht 68.0 in | Wt 186.5 lb

## 2013-10-22 DIAGNOSIS — I4891 Unspecified atrial fibrillation: Secondary | ICD-10-CM

## 2013-10-22 DIAGNOSIS — D801 Nonfamilial hypogammaglobulinemia: Secondary | ICD-10-CM

## 2013-10-22 DIAGNOSIS — C9000 Multiple myeloma not having achieved remission: Secondary | ICD-10-CM

## 2013-10-22 NOTE — Telephone Encounter (Signed)
gvj adnp rinted papt sched and avs for pt for DEC...sed added tx.

## 2013-10-22 NOTE — Progress Notes (Signed)
[facility]    OFFICE PROGRESS NOTE   INTERVAL HISTORY:   He returns for scheduled followup of multiple myeloma. He feels well. No pain. He has noted tearing and erythema at the left conjunctiva for the past 2 weeks. He was prescribed eye drops by an optometrist. No visual change.  Objective:  Vital signs in last 24 hours:  Blood pressure 153/80, pulse 71, temperature 97.6 F (36.4 C), temperature source Oral, height 5\' 8"  (1.727 m), weight 186 lb 8 oz (84.596 kg), SpO2 99.00%.    HEENT: Oropharynx without visible mass, neck without mass, left conjunctival erythema. No. Months. No mass at the left eyelid Lymphatics: No cervical, supraclavicular, or right axillary nodes.? 1/2 cm left axillary node Resp: Lungs clear bilaterally Cardio: Regular rate and rhythm GI: No hepatosplenomegaly, nontender Vascular: No leg edema   Lab Results:  Lab Results  Component Value Date   WBC 5.8 10/18/2013   HGB 14.3 10/18/2013   HCT 41.5 10/18/2013   MCV 94.0 10/18/2013   PLT 110* 10/18/2013   ANC 3.3  Potassium 3.8, creatinine 1.1, calcium 10.3, albumin 4.2, serum free lambda light chains 52.5    Medications: I have reviewed the patient's current medications.  Assessment/Plan: 1. Multiple myeloma: The serum free light chains were elevated 08/29/2009. A CT of the chest 08/31/2009 showed a new pleural mass consistent with progression of multiple myeloma. He completed 12 cycles of Revlimid/Decadron. The serum free light chains were improved to 4.34 on 12/29/2009. A restaging CT of the chest 02/19/2010 showed resolution of right axillary lymphadenopathy and a pleural-based mass. The serum free lambda light chains are now elevated. 2. History of back pain, likely related to the pleural-based mass at the right chest, resolved. 3. Right axillary/subpectoral lymphadenopathy on a CT of the chest 08/31/2009, likely related to multiple myeloma, resolved. 4. Right 3rd rib  plasmacytoma, status post surgical resection. He is maintained on every three-month Zometa. 5. Pancytopenia secondary to Revlimid and multiple myeloma. He has persistent mild thrombocytopenia. 6. Hypertension. 7. Status post hip replacement. 8. History of back surgery. 9. History of trigeminal neuralgia. 10. Status post removal of a lipoma from the right axilla in February 2010. 11. 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 12. Esophageal reflux disease, followed by Dr. Jearld Fenton.  13. Esophageal stricture, status post an evaluation by Dr. Marina Goodell. 14. Right vocal cord lesion, status post a biopsy by Dr. 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 He completed radiation under the direction of Dr. Dayton Scrape on 03/15/2011. 15. Admission with pneumonia 03/26/2010. 16. Atrial flutter/fibrillation while hospitalized August 2011. He is followed by Dr. Antoine Poche. 17. Elevated prostate specific antigen, status post a biopsy 01/23/2011. He was found to have a Gleason 6 cancer in 10% of the cores. He is followed with an observation approach. He is now followed by Dr. Retta Diones.        18. History of Mild hypercalcemia-the calcium level was normal on 10/18/2013        19. Left eye conjunctival erythema/increased tearing-? Viral conjunctivitis. He will followup with ophthalmology later today.   Disposition:  The serum free light chains are rising, but he has no symptoms related to myeloma. There is no clear indication for treating the myeloma at present. He would like to continue an observation approach. He will return for an office and lab visit in 6 weeks. He will receive Zometa when he returns in 6 weeks. He will make the optometrist aware he has  myeloma. He will contact us if the eye symptoms do not improve.   Thornton Papas, MD  10/22/2013  10:51 AM

## 2013-11-26 ENCOUNTER — Telehealth: Payer: Self-pay | Admitting: *Deleted

## 2013-11-26 NOTE — Telephone Encounter (Signed)
Received faxed request from Dr. Retta Diones requesting PSA to be drawn in this office prior to pt's next appt with Dahlstedt in Feb. Pt has lab appt 12/22. Request given to phlebotomy staff to be drawn that day.

## 2013-11-29 ENCOUNTER — Other Ambulatory Visit (HOSPITAL_BASED_OUTPATIENT_CLINIC_OR_DEPARTMENT_OTHER): Payer: Medicare Other

## 2013-11-29 DIAGNOSIS — C9000 Multiple myeloma not having achieved remission: Secondary | ICD-10-CM

## 2013-11-29 LAB — COMPREHENSIVE METABOLIC PANEL (CC13)
ALT: 41 U/L (ref 0–55)
AST: 37 U/L — ABNORMAL HIGH (ref 5–34)
Alkaline Phosphatase: 72 U/L (ref 40–150)
Anion Gap: 7 mEq/L (ref 3–11)
BUN: 19.3 mg/dL (ref 7.0–26.0)
CO2: 27 mEq/L (ref 22–29)
Chloride: 103 mEq/L (ref 98–109)
Creatinine: 1.1 mg/dL (ref 0.7–1.3)
Sodium: 137 mEq/L (ref 136–145)
Total Bilirubin: 0.61 mg/dL (ref 0.20–1.20)
Total Protein: 6.9 g/dL (ref 6.4–8.3)

## 2013-11-29 LAB — CBC WITH DIFFERENTIAL/PLATELET
BASO%: 0.3 % (ref 0.0–2.0)
EOS%: 2.4 % (ref 0.0–7.0)
HCT: 42 % (ref 38.4–49.9)
LYMPH%: 19.9 % (ref 14.0–49.0)
MCH: 32.3 pg (ref 27.2–33.4)
MCHC: 34.6 g/dL (ref 32.0–36.0)
MONO#: 0.7 10*3/uL (ref 0.1–0.9)
MONO%: 9 % (ref 0.0–14.0)
NEUT%: 68.4 % (ref 39.0–75.0)
Platelets: 111 10*3/uL — ABNORMAL LOW (ref 140–400)
RBC: 4.5 10*6/uL (ref 4.20–5.82)
WBC: 7.8 10*3/uL (ref 4.0–10.3)

## 2013-11-30 LAB — KAPPA/LAMBDA LIGHT CHAINS
Kappa:Lambda Ratio: 0.03 — ABNORMAL LOW (ref 0.26–1.65)
Lambda Free Lght Chn: 51 mg/dL — ABNORMAL HIGH (ref 0.57–2.63)

## 2013-12-03 ENCOUNTER — Telehealth: Payer: Self-pay | Admitting: *Deleted

## 2013-12-03 ENCOUNTER — Telehealth: Payer: Self-pay | Admitting: Oncology

## 2013-12-03 ENCOUNTER — Ambulatory Visit (HOSPITAL_BASED_OUTPATIENT_CLINIC_OR_DEPARTMENT_OTHER): Payer: Medicare Other | Admitting: Oncology

## 2013-12-03 ENCOUNTER — Ambulatory Visit (HOSPITAL_BASED_OUTPATIENT_CLINIC_OR_DEPARTMENT_OTHER): Payer: Medicare Other

## 2013-12-03 VITALS — BP 122/68 | HR 77 | Temp 97.0°F | Resp 18 | Ht 68.0 in | Wt 189.2 lb

## 2013-12-03 DIAGNOSIS — D801 Nonfamilial hypogammaglobulinemia: Secondary | ICD-10-CM

## 2013-12-03 DIAGNOSIS — D61818 Other pancytopenia: Secondary | ICD-10-CM

## 2013-12-03 DIAGNOSIS — D696 Thrombocytopenia, unspecified: Secondary | ICD-10-CM

## 2013-12-03 DIAGNOSIS — C61 Malignant neoplasm of prostate: Secondary | ICD-10-CM

## 2013-12-03 DIAGNOSIS — C9000 Multiple myeloma not having achieved remission: Secondary | ICD-10-CM

## 2013-12-03 MED ORDER — SODIUM CHLORIDE 0.9 % IV SOLN
INTRAVENOUS | Status: DC
  Administered 2013-12-03: 11:00:00 via INTRAVENOUS

## 2013-12-03 MED ORDER — ZOLEDRONIC ACID 4 MG/100ML IV SOLN
4.0000 mg | Freq: Once | INTRAVENOUS | Status: AC
  Administered 2013-12-03: 4 mg via INTRAVENOUS
  Filled 2013-12-03: qty 100

## 2013-12-03 MED ORDER — ZOLEDRONIC ACID 4 MG/100ML IV SOLN
4.0000 mg | Freq: Once | INTRAVENOUS | Status: DC
  Filled 2013-12-03: qty 100

## 2013-12-03 NOTE — Telephone Encounter (Signed)
appts made per 12/26 POF Email to MW to add tx AVS and CAl given shh

## 2013-12-03 NOTE — Progress Notes (Signed)
   Richard [name]    OFFICE PROGRESS NOTE   INTERVAL HISTORY:   Richard [name] returns for scheduled followup of multiple myeloma. He feels well. No complaint. Good appetite. The left eye erythema has improved.  Objective:  Vital signs in last 24 hours:  Blood pressure 122/68, pulse 77, temperature 97 F (36.1 C), temperature source Oral, resp. rate 18, height 5\' 8"  (1.727 m), weight 189 lb 3.2 oz (85.821 kg), SpO2 100.00%.    HEENT: Mild left conjunctival erythema Resp: Lungs clear bilaterally Cardio: Regular rate and rhythm GI: No hepatosplenomegaly Vascular: No leg edema   Lab Results:  Lab Results  Component Value Date   WBC 7.8 11/29/2013   HGB 14.5 11/29/2013   HCT 42.0 11/29/2013   MCV 93.3 11/29/2013   PLT 111* 11/29/2013   NEUTROABS 5.3 11/29/2013   calcium 10.5, creatinine 1.1 Serum free lambda light chains 51    Medications: I have reviewed the patient's current medications.  Assessment/Plan: 1. Multiple myeloma: The serum free light chains were elevated 08/29/2009. A CT of the chest 08/31/2009 showed a new pleural mass consistent with progression of multiple myeloma. He completed 12 cycles of Revlimid/Decadron. The serum free light chains were improved to 4.34 on 12/29/2009. A restaging CT of the chest 02/19/2010 showed resolution of right axillary lymphadenopathy and a pleural-based mass. The serum free lambda light chains are now elevated. 2. History of back pain, likely related to the pleural-based mass at the right chest, resolved. 3. Right axillary/subpectoral lymphadenopathy on a CT of the chest 08/31/2009, likely related to multiple myeloma, resolved. 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 He is maintained on every three-month Zometa. 5. Pancytopenia secondary to Revlimid and multiple myeloma. He has persistent mild thrombocytopenia. 6. Hypertension. 7. Status post hip replacement. 8. History of back  surgery. 9. History of trigeminal neuralgia. 10. Status post removal of a lipoma from the right axilla in February 2010. 11. 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 12. Esophageal reflux disease, followed by Dr. Jearld Fenton.  13. Esophageal stricture, status post an evaluation by Dr. Marina Goodell. 14. Right vocal cord lesion, status post a biopsy by Dr. 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 He completed radiation under the direction of Dr. Dayton Scrape on 03/15/2011. 15. Admission with pneumonia 03/26/2010. 16. Atrial flutter/fibrillation while hospitalized August 2011. He is followed by Dr. Antoine Poche. 17. Elevated prostate specific antigen, status post a biopsy 01/23/2011. He was found to have a Gleason 6 cancer in 10% of the cores. He is followed with an observation approach. He is now followed by Dr. Retta Diones.        18. History of Mild hypercalcemia-the calcium level remains mildly elevate       19. Left eye conjunctival erythema/increased tearing-? Viral conjunctivitis. He is followed by ophthalmology and reports he will need "cataract "surgery, the left conjunctival erythema has improved       Disposition:  He remains asymptomatic from the multiple myeloma. The serum light chains and CBC are stable. The calcium is still mildly elevated.  We decided to continue an observation approach. He will return for a lab visit in 6 weeks and an office visit in 3 months. He will receive Zometa today and again in 3 months.   Thornton Papas, MD  12/03/2013  3:30 PM

## 2013-12-03 NOTE — Patient Instructions (Signed)
[facility] Discharge Instructions for Patients Receiving Zometa Today you received the following agent: Zometa  If you develop nausea and vomiting that is not controlled by your nausea medication, call the clinic.   BELOW ARE SYMPTOMS THAT SHOULD BE REPORTED IMMEDIATELY:  *FEVER GREATER THAN 100.5 F  *CHILLS WITH OR WITHOUT FEVER  NAUSEA AND VOMITING THAT IS NOT CONTROLLED WITH YOUR NAUSEA MEDICATION  *UNUSUAL SHORTNESS OF BREATH  *UNUSUAL BRUISING OR BLEEDING  TENDERNESS IN MOUTH AND THROAT WITH OR WITHOUT PRESENCE OF ULCERS  *URINARY PROBLEMS  *BOWEL PROBLEMS  UNUSUAL RASH Items with * indicate a potential emergency and should be followed up as soon as possible.  Feel free to call the clinic you have any questions or concerns. The clinic phone number is [phone].

## 2013-12-03 NOTE — Telephone Encounter (Signed)
Per staff message and POF I have scheduled appts.  JMW  

## 2013-12-24 ENCOUNTER — Other Ambulatory Visit: Payer: Medicare Other

## 2014-01-14 ENCOUNTER — Other Ambulatory Visit (HOSPITAL_BASED_OUTPATIENT_CLINIC_OR_DEPARTMENT_OTHER): Payer: Medicare Other

## 2014-01-14 DIAGNOSIS — C61 Malignant neoplasm of prostate: Secondary | ICD-10-CM

## 2014-01-14 DIAGNOSIS — C9000 Multiple myeloma not having achieved remission: Secondary | ICD-10-CM

## 2014-01-14 LAB — COMPREHENSIVE METABOLIC PANEL (CC13)
ALK PHOS: 70 U/L (ref 40–150)
ALT: 35 U/L (ref 0–55)
AST: 23 U/L (ref 5–34)
Albumin: 4.7 g/dL (ref 3.5–5.0)
Anion Gap: 9 mEq/L (ref 3–11)
BILIRUBIN TOTAL: 0.89 mg/dL (ref 0.20–1.20)
BUN: 11.2 mg/dL (ref 7.0–26.0)
CO2: 26 mEq/L (ref 22–29)
Calcium: 10 mg/dL (ref 8.4–10.4)
Chloride: 97 mEq/L — ABNORMAL LOW (ref 98–109)
Creatinine: 1.1 mg/dL (ref 0.7–1.3)
Glucose: 102 mg/dl (ref 70–140)
Potassium: 4.3 mEq/L (ref 3.5–5.1)
Sodium: 132 mEq/L — ABNORMAL LOW (ref 136–145)
Total Protein: 7 g/dL (ref 6.4–8.3)

## 2014-01-14 LAB — CBC WITH DIFFERENTIAL/PLATELET
BASO%: 0.2 % (ref 0.0–2.0)
BASOS ABS: 0 10*3/uL (ref 0.0–0.1)
EOS%: 1.7 % (ref 0.0–7.0)
Eosinophils Absolute: 0.1 10*3/uL (ref 0.0–0.5)
HCT: 43.6 % (ref 38.4–49.9)
HGB: 15.2 g/dL (ref 13.0–17.1)
LYMPH%: 24.2 % (ref 14.0–49.0)
MCH: 32.2 pg (ref 27.2–33.4)
MCHC: 34.8 g/dL (ref 32.0–36.0)
MCV: 92.7 fL (ref 79.3–98.0)
MONO#: 0.5 10*3/uL (ref 0.1–0.9)
MONO%: 10 % (ref 0.0–14.0)
NEUT#: 3.2 10*3/uL (ref 1.5–6.5)
NEUT%: 63.9 % (ref 39.0–75.0)
Platelets: 115 10*3/uL — ABNORMAL LOW (ref 140–400)
RBC: 4.7 10*6/uL (ref 4.20–5.82)
RDW: 13.6 % (ref 11.0–14.6)
WBC: 5 10*3/uL (ref 4.0–10.3)
lymph#: 1.2 10*3/uL (ref 0.9–3.3)

## 2014-01-17 LAB — PSA: PSA: 1.21 ng/mL (ref <=4.00)

## 2014-01-17 LAB — KAPPA/LAMBDA LIGHT CHAINS
KAPPA LAMBDA RATIO: 0.02 — AB (ref 0.26–1.65)
Kappa free light chain: 1.41 mg/dL (ref 0.33–1.94)
LAMBDA FREE LGHT CHN: 58.6 mg/dL — AB (ref 0.57–2.63)

## 2014-01-19 ENCOUNTER — Telehealth: Payer: Self-pay | Admitting: *Deleted

## 2014-01-19 NOTE — Telephone Encounter (Signed)
Message copied by Tania Ade on Wed Jan 19, 2014  2:20 PM ------      Message from: Richard [name]      Created: Tue Jan 18, 2014  8:08 PM       Please call patient, labs are stable, f/u as scheduled ------

## 2014-01-19 NOTE — Telephone Encounter (Signed)
Notified daughter of stable lab results. She requests copy of PSA be forwarded to Dr. Diona Fanti. Lab routed.

## 2014-01-27 ENCOUNTER — Other Ambulatory Visit: Payer: Self-pay | Admitting: Dermatology

## 2014-02-21 ENCOUNTER — Other Ambulatory Visit (HOSPITAL_BASED_OUTPATIENT_CLINIC_OR_DEPARTMENT_OTHER): Payer: Medicare Other

## 2014-02-21 DIAGNOSIS — C9000 Multiple myeloma not having achieved remission: Secondary | ICD-10-CM

## 2014-02-21 LAB — COMPREHENSIVE METABOLIC PANEL (CC13)
ALBUMIN: 4.4 g/dL (ref 3.5–5.0)
ALT: 30 U/L (ref 0–55)
ANION GAP: 7 meq/L (ref 3–11)
AST: 25 U/L (ref 5–34)
Alkaline Phosphatase: 55 U/L (ref 40–150)
BUN: 19.3 mg/dL (ref 7.0–26.0)
CHLORIDE: 103 meq/L (ref 98–109)
CO2: 27 meq/L (ref 22–29)
Calcium: 10.1 mg/dL (ref 8.4–10.4)
Creatinine: 1.1 mg/dL (ref 0.7–1.3)
GLUCOSE: 65 mg/dL — AB (ref 70–140)
Potassium: 4.1 mEq/L (ref 3.5–5.1)
SODIUM: 137 meq/L (ref 136–145)
TOTAL PROTEIN: 7 g/dL (ref 6.4–8.3)
Total Bilirubin: 0.8 mg/dL (ref 0.20–1.20)

## 2014-02-21 LAB — CBC WITH DIFFERENTIAL/PLATELET
BASO%: 0.3 % (ref 0.0–2.0)
Basophils Absolute: 0 10*3/uL (ref 0.0–0.1)
EOS ABS: 0.1 10*3/uL (ref 0.0–0.5)
EOS%: 2.3 % (ref 0.0–7.0)
HCT: 41.4 % (ref 38.4–49.9)
HGB: 14.3 g/dL (ref 13.0–17.1)
LYMPH#: 1.5 10*3/uL (ref 0.9–3.3)
LYMPH%: 23.5 % (ref 14.0–49.0)
MCH: 32.5 pg (ref 27.2–33.4)
MCHC: 34.6 g/dL (ref 32.0–36.0)
MCV: 94.2 fL (ref 79.3–98.0)
MONO#: 0.5 10*3/uL (ref 0.1–0.9)
MONO%: 8.8 % (ref 0.0–14.0)
NEUT%: 65.1 % (ref 39.0–75.0)
NEUTROS ABS: 4 10*3/uL (ref 1.5–6.5)
Platelets: 122 10*3/uL — ABNORMAL LOW (ref 140–400)
RBC: 4.4 10*6/uL (ref 4.20–5.82)
RDW: 14.1 % (ref 11.0–14.6)
WBC: 6.2 10*3/uL (ref 4.0–10.3)

## 2014-02-22 LAB — KAPPA/LAMBDA LIGHT CHAINS
KAPPA FREE LGHT CHN: 1.66 mg/dL (ref 0.33–1.94)
Kappa:Lambda Ratio: 0.03 — ABNORMAL LOW (ref 0.26–1.65)
Lambda Free Lght Chn: 66.4 mg/dL — ABNORMAL HIGH (ref 0.57–2.63)

## 2014-02-25 ENCOUNTER — Telehealth: Payer: Self-pay | Admitting: Oncology

## 2014-02-25 ENCOUNTER — Ambulatory Visit (HOSPITAL_BASED_OUTPATIENT_CLINIC_OR_DEPARTMENT_OTHER): Payer: Medicare Other | Admitting: Oncology

## 2014-02-25 ENCOUNTER — Ambulatory Visit (HOSPITAL_BASED_OUTPATIENT_CLINIC_OR_DEPARTMENT_OTHER): Payer: Medicare Other

## 2014-02-25 VITALS — BP 129/72 | HR 88 | Temp 97.9°F | Resp 18 | Ht 68.0 in | Wt 184.3 lb

## 2014-02-25 DIAGNOSIS — C9000 Multiple myeloma not having achieved remission: Secondary | ICD-10-CM

## 2014-02-25 DIAGNOSIS — D696 Thrombocytopenia, unspecified: Secondary | ICD-10-CM

## 2014-02-25 DIAGNOSIS — D801 Nonfamilial hypogammaglobulinemia: Secondary | ICD-10-CM

## 2014-02-25 MED ORDER — HEPARIN SOD (PORK) LOCK FLUSH 100 UNIT/ML IV SOLN
250.0000 [IU] | Freq: Once | INTRAVENOUS | Status: DC | PRN
  Filled 2014-02-25: qty 5

## 2014-02-25 MED ORDER — SODIUM CHLORIDE 0.9 % IJ SOLN
10.0000 mL | INTRAMUSCULAR | Status: DC | PRN
  Filled 2014-02-25: qty 10

## 2014-02-25 MED ORDER — HEPARIN SOD (PORK) LOCK FLUSH 100 UNIT/ML IV SOLN
500.0000 [IU] | Freq: Once | INTRAVENOUS | Status: DC | PRN
  Filled 2014-02-25: qty 5

## 2014-02-25 MED ORDER — ALTEPLASE 2 MG IJ SOLR
2.0000 mg | Freq: Once | INTRAMUSCULAR | Status: DC | PRN
  Filled 2014-02-25: qty 2

## 2014-02-25 MED ORDER — ZOLEDRONIC ACID 4 MG/100ML IV SOLN
4.0000 mg | Freq: Once | INTRAVENOUS | Status: AC
  Administered 2014-02-25: 4 mg via INTRAVENOUS
  Filled 2014-02-25: qty 100

## 2014-02-25 MED ORDER — SODIUM CHLORIDE 0.9 % IJ SOLN
3.0000 mL | Freq: Once | INTRAMUSCULAR | Status: DC | PRN
  Filled 2014-02-25: qty 10

## 2014-02-25 NOTE — Patient Instructions (Signed)

## 2014-02-25 NOTE — Telephone Encounter (Signed)
gv adn printed appt sched and avs for pt for June....sed added tx. °

## 2014-02-25 NOTE — Progress Notes (Signed)
   [place]    OFFICE PROGRESS NOTE   INTERVAL HISTORY:   Richard [name] returns for scheduled followup of multiple myeloma. He feels well. No pain. No specific complaint. He recently had right cataract surgery.  Objective:  Vital signs in last 24 hours:  Blood pressure 129/72, pulse 88, temperature 97.9 F (36.6 C), temperature source Oral, resp. rate 18, height $RemoveBe'5\' 8"'mSgAHWSZA$  (1.727 m), weight 184 lb 4.8 oz (83.598 kg), SpO2 99.00%.    HEENT: Neck without mass Lymphatics: No cervical, supraventricular, or axillary nodes Resp: Lungs clear bilaterally Cardio: Regular rate and rhythm GI: No hepatosplenomegaly Vascular: No leg edema   Lab Results:  Lab Results  Component Value Date   WBC 6.2 02/21/2014   HGB 14.3 02/21/2014   HCT 41.4 02/21/2014   MCV 94.2 02/21/2014   PLT 122* 02/21/2014   NEUTROABS 4.0 02/21/2014   BUN 19.3, creatinine 1.1, calcium 10.1 Serum free lambda light chains 66.4   Medications: I have reviewed the patient's current medications.  Assessment/Plan: 1. Multiple myeloma: The serum free light chains were elevated 08/29/2009. A CT of the chest 08/31/2009 showed a new pleural mass consistent with progression of multiple myeloma. He completed 12 cycles of Revlimid/Decadron. The serum free light chains were improved to 4.34 on 12/29/2009. A restaging CT of the chest 02/19/2010 showed resolution of right axillary lymphadenopathy and a pleural-based mass. The serum free lambda light chains are now elevated. 2. History of back pain, likely related to the pleural-based mass at the right chest, resolved. 3. Right axillary/subpectoral lymphadenopathy on a CT of the chest 08/31/2009, likely related to multiple myeloma, resolved. 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 He is maintained on every three-month Zometa. 5. Pancytopenia secondary to Revlimid and multiple myeloma. He has persistent mild  thrombocytopenia. 6. Hypertension. 7. Status post hip replacement. 8. History of back surgery. 9. History of trigeminal neuralgia. 10. Status post removal of a lipoma from the right axilla in February 2010. 11. 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 12. Esophageal reflux disease, followed by Dr. Janace Hoard.  13. Esophageal stricture, status post an evaluation by Dr. Henrene Pastor. 14. Right vocal cord lesion, status post a biopsy by Dr. 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 He completed radiation under the direction of Dr. Valere Dross on 03/15/2011. 15. Admission with pneumonia 03/26/2010. 16. Atrial flutter/fibrillation while hospitalized August 2011. He is followed by Dr. Percival Spanish. 17. Elevated prostate specific antigen, status post a biopsy 01/23/2011. He was found to have a Gleason 6 cancer in 10% of the cores. He is followed with an observation approach. He is now followed by Dr. Diona Fanti.  18. History of Mild hypercalcemia   Disposition:  Richard [name] appears stable. The serum light chains are slightly higher, but there is no other evidence for progression of the multiple myeloma. The plan is to continue an observation approach. He continues every three-month Zometa. He will return for an office and lab visit in 3 months.   Betsy Coder, MD  02/25/2014  11:04 AM

## 2014-05-19 ENCOUNTER — Other Ambulatory Visit (HOSPITAL_BASED_OUTPATIENT_CLINIC_OR_DEPARTMENT_OTHER): Payer: Medicare Other

## 2014-05-19 DIAGNOSIS — C9000 Multiple myeloma not having achieved remission: Secondary | ICD-10-CM

## 2014-05-19 LAB — COMPREHENSIVE METABOLIC PANEL (CC13)
ALK PHOS: 66 U/L (ref 40–150)
ALT: 27 U/L (ref 0–55)
ANION GAP: 8 meq/L (ref 3–11)
AST: 21 U/L (ref 5–34)
Albumin: 4.2 g/dL (ref 3.5–5.0)
BILIRUBIN TOTAL: 0.88 mg/dL (ref 0.20–1.20)
BUN: 19.3 mg/dL (ref 7.0–26.0)
CO2: 26 meq/L (ref 22–29)
CREATININE: 1.1 mg/dL (ref 0.7–1.3)
Calcium: 10.2 mg/dL (ref 8.4–10.4)
Chloride: 101 mEq/L (ref 98–109)
GLUCOSE: 94 mg/dL (ref 70–140)
Potassium: 4.3 mEq/L (ref 3.5–5.1)
Sodium: 135 mEq/L — ABNORMAL LOW (ref 136–145)
TOTAL PROTEIN: 6.6 g/dL (ref 6.4–8.3)

## 2014-05-19 LAB — CBC WITH DIFFERENTIAL/PLATELET
BASO%: 0.2 % (ref 0.0–2.0)
Basophils Absolute: 0 10*3/uL (ref 0.0–0.1)
EOS ABS: 0.1 10*3/uL (ref 0.0–0.5)
EOS%: 1.6 % (ref 0.0–7.0)
HEMATOCRIT: 42.5 % (ref 38.4–49.9)
HGB: 14.6 g/dL (ref 13.0–17.1)
LYMPH%: 21.7 % (ref 14.0–49.0)
MCH: 32.2 pg (ref 27.2–33.4)
MCHC: 34.4 g/dL (ref 32.0–36.0)
MCV: 93.8 fL (ref 79.3–98.0)
MONO#: 0.7 10*3/uL (ref 0.1–0.9)
MONO%: 10.5 % (ref 0.0–14.0)
NEUT#: 4.3 10*3/uL (ref 1.5–6.5)
NEUT%: 66 % (ref 39.0–75.0)
PLATELETS: 119 10*3/uL — AB (ref 140–400)
RBC: 4.53 10*6/uL (ref 4.20–5.82)
RDW: 13.6 % (ref 11.0–14.6)
WBC: 6.5 10*3/uL (ref 4.0–10.3)
lymph#: 1.4 10*3/uL (ref 0.9–3.3)

## 2014-05-20 LAB — KAPPA/LAMBDA LIGHT CHAINS
KAPPA FREE LGHT CHN: 0.71 mg/dL (ref 0.33–1.94)
KAPPA LAMBDA RATIO: 0.01 — AB (ref 0.26–1.65)
LAMBDA FREE LGHT CHN: 66.9 mg/dL — AB (ref 0.57–2.63)

## 2014-05-26 ENCOUNTER — Ambulatory Visit (HOSPITAL_BASED_OUTPATIENT_CLINIC_OR_DEPARTMENT_OTHER): Payer: Medicare Other | Admitting: Oncology

## 2014-05-26 ENCOUNTER — Telehealth: Payer: Self-pay | Admitting: *Deleted

## 2014-05-26 ENCOUNTER — Telehealth: Payer: Self-pay | Admitting: Oncology

## 2014-05-26 ENCOUNTER — Ambulatory Visit (HOSPITAL_BASED_OUTPATIENT_CLINIC_OR_DEPARTMENT_OTHER): Payer: Medicare Other

## 2014-05-26 VITALS — BP 133/64 | HR 94 | Temp 98.4°F | Resp 18 | Ht 68.0 in | Wt 184.5 lb

## 2014-05-26 DIAGNOSIS — C9000 Multiple myeloma not having achieved remission: Secondary | ICD-10-CM

## 2014-05-26 DIAGNOSIS — I10 Essential (primary) hypertension: Secondary | ICD-10-CM

## 2014-05-26 DIAGNOSIS — I4891 Unspecified atrial fibrillation: Secondary | ICD-10-CM

## 2014-05-26 MED ORDER — SODIUM CHLORIDE 0.9 % IJ SOLN
10.0000 mL | INTRAMUSCULAR | Status: DC | PRN
  Filled 2014-05-26: qty 10

## 2014-05-26 MED ORDER — ZOLEDRONIC ACID 4 MG/100ML IV SOLN
4.0000 mg | Freq: Once | INTRAVENOUS | Status: AC
  Administered 2014-05-26: 4 mg via INTRAVENOUS
  Filled 2014-05-26: qty 100

## 2014-05-26 NOTE — Patient Instructions (Signed)
[facility] Discharge Instructions  Thank you for choosing [facility] to provide your oncology and hematology care.  To afford each patient quality time with our providers, please arrive at least 30 minutes before your scheduled appointment time.  With your help, our goal is to use those 30 minutes to complete the necessary work-up to ensure our physicians have the information they need to help with your evaluation and healthcare recommendations.   ___________________  Should you have questions after your visit to [facility], please contact our office at [phone] between the hours of 8:30 a.m. and 4:30 p.m.  Voicemails left after 4:00 p.m. will not be returned until the following business day.  For prescription refill requests, have your pharmacy contact our office with your prescription refill request. We request 24 hour notice for all refill requests.    *Remember to bring copy of Advanced Directive to be scanned*   Fall Prevention and Home Safety Falls cause injuries and can affect all age groups. It is possible to use preventive measures to significantly decrease the likelihood of falls. There are many simple measures which can make your home safer and prevent falls. OUTDOORS  Repair cracks and edges of walkways and driveways.  Remove high doorway thresholds.  Trim shrubbery on the main path into your home.  Have good outside lighting.  Clear walkways of tools, rocks, debris, and clutter.  Check that handrails are not broken and are securely fastened. Both sides of steps should have handrails.  Have leaves, snow, and ice cleared regularly.  Use sand or salt on walkways during winter months.  In the garage, clean up grease or oil spills. BATHROOM  Install night lights.  Install grab bars by the toilet and in the tub and shower.  Use non-skid mats or decals in the tub or shower.  Place a plastic non-slip stool in the shower  to sit on, if needed.  Keep floors dry and clean up all water on the floor immediately.  Remove soap buildup in the tub or shower on a regular basis.  Secure bath mats with non-slip, double-sided rug tape.  Remove throw rugs and tripping hazards from the floors. BEDROOMS  Install night lights.  Make sure a bedside light is easy to reach.  Do not use oversized bedding.  Keep a telephone by your bedside.  Have a firm chair with side arms to use for getting dressed.  Remove throw rugs and tripping hazards from the floor. KITCHEN  Keep handles on pots and pans turned toward the center of the stove. Use back burners when possible.  Clean up spills quickly and allow time for drying.  Avoid walking on wet floors.  Avoid hot utensils and knives.  Position shelves so they are not too high or low.  Place commonly used objects within easy reach.  If necessary, use a sturdy step stool with a grab bar when reaching.  Keep electrical cables out of the way.  Do not use floor polish or wax that makes floors slippery. If you must use wax, use non-skid floor wax.  Remove throw rugs and tripping hazards from the floor. STAIRWAYS  Never leave objects on stairs.  Place handrails on both sides of stairways and use them. Fix any loose handrails. Make sure handrails on both sides of the stairways are as long as the stairs.  Check carpeting to make sure it is firmly attached along stairs. Make repairs to worn or loose  carpet promptly.  Avoid placing throw rugs at the top or bottom of stairways, or properly secure the rug with carpet tape to prevent slippage. Get rid of throw rugs, if possible.  Have an electrician put in a light switch at the top and bottom of the stairs. OTHER FALL PREVENTION TIPS  Wear low-heel or rubber-soled shoes that are supportive and fit well. Wear closed toe shoes.  When using a stepladder, make sure it is fully opened and both spreaders are firmly locked.  Do not climb a closed stepladder.  Add color or contrast paint or tape to grab bars and handrails in your home. Place contrasting color strips on first and last steps.  Learn and use mobility aids as needed. Install an electrical emergency response system.  Turn on lights to avoid dark areas. Replace light bulbs that burn out immediately. Get light switches that glow.  Arrange furniture to create clear pathways. Keep furniture in the same place.  Firmly attach carpet with non-skid or double-sided tape.  Eliminate uneven floor surfaces.  Select a carpet pattern that does not visually hide the edge of steps.  Be aware of all pets. OTHER HOME SAFETY TIPS  Set the water temperature for 120 F (48.8 C).  Keep emergency numbers on or near the telephone.  Keep smoke detectors on every level of the home and near sleeping areas. Document Released: 11/15/2002 Document Revised: 05/26/2012 Document Reviewed: 02/14/2012 [facility] Patient Information 2015 [place], [place]. This information is not intended to replace advice given to you by your health care provider. Make sure you discuss any questions you have with your health care provider.

## 2014-05-26 NOTE — Progress Notes (Signed)
[place] OFFICE PROGRESS NOTE   Diagnosis: Multiple myeloma  INTERVAL HISTORY:   Mr. Kats returns as scheduled. He fell at home early this week while going down outdoor stairs. He landed on his shoulder, head, and "thumb ". His daughter reports he was evaluated at an urgent care and diagnosed with a left wrist fracture. He was referred to [place] and now has a left lower arm cast in place.  He denies recent infections. He feels "sore "from the fall, but otherwise has no pain. Good appetite.  His daughter reports he has had some difficulty with memory recall recently. This predated the fall. He is scheduled for an appointment and "brain scan "with his primary physician next week.  Objective:  Vital signs in last 24 hours:  Blood pressure 133/64, pulse 94, temperature 98.4 F (36.9 C), temperature source Oral, resp. rate 18, height 5' 8"  (1.727 m), weight 184 lb 8 oz (83.689 kg), SpO2 100.00%.    HEENT: abrasion with soft tissue swelling at the left pretibial region Lymphatics: No cervical, supraclavicular, or axillary nodes Resp: Lungs clear bilaterally Cardio: Regular rate and rhythm with occasional premature beats GI: No hepatosplenomegaly Vascular: No leg edema Neuro: Alert, oriented to year and day. He could not name the date. Finger to nose testing is normal. The motor exam appears intact in the upper and lower extremities. The gait is unremarkable  Skin: Abrasion at the left shoulder    Lab Results:  Lab Results  Component Value Date   WBC 6.5 05/19/2014   HGB 14.6 05/19/2014   HCT 42.5 05/19/2014   MCV 93.8 05/19/2014   PLT 119* 05/19/2014   NEUTROABS 4.3 05/19/2014   potassium 4.3, creatinine 1.1, calcium 10.2, albumin 4.2, lambda free light chains 66.9  Imaging:  No results found.  Medications: I have reviewed the patient's current medications.  Assessment/Plan: 1. Multiple myeloma: The serum free light chains were elevated  08/29/2009. A CT of the chest 08/31/2009 showed a new pleural mass consistent with progression of multiple myeloma. He completed 12 cycles of Revlimid/Decadron. The serum free light chains were improved to 4.34 on 12/29/2009. A restaging CT of the chest 02/19/2010 showed resolution of right axillary lymphadenopathy and a pleural-based mass. The serum free lambda light chains are now elevated. 2. History of back pain, likely related to the pleural-based mass at the right chest, resolved. 3. Right axillary/subpectoral lymphadenopathy on a CT of the chest 08/31/2009, likely related to multiple myeloma, resolved. 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 He is maintained on every three-month Zometa. 5. Pancytopenia secondary to Revlimid and multiple myeloma. He has persistent mild thrombocytopenia. 6. Hypertension. 7. Status post hip replacement. 8. History of back surgery. 9. History of trigeminal neuralgia. 10. Status post removal of a lipoma from the right axilla in February 2010. 11. 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 12. Esophageal reflux disease, followed by Dr. Janace Hoard.  13. Esophageal stricture, status post an evaluation by Dr. Henrene Pastor. 14. Right vocal cord lesion, status post a biopsy by Dr. 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 He completed radiation under the direction of Dr. Valere Dross on 03/15/2011. 15. Admission with pneumonia 03/26/2010. 16. Atrial flutter/fibrillation while hospitalized August 2011. He is followed by Dr. Percival Spanish. 17. Elevated prostate specific antigen, status post a biopsy 01/23/2011. He was found to have a Gleason 6 cancer in 10% of the cores. He is followed with an observation approach. He is now followed by Dr.  Dahlstedt.        18. History of Mild hypercalcemia        19. Fall with a left wrist fracture June 2015   Disposition:  Mr. Pelissier appears stable from a hematologic standpoint.  He will followup with orthopedics and his primary physician after the recent fall. His daughter will take him to the emergency room if he develops new mental status changes. There is no clinical evidence for progression of the multiple myeloma. The plan is to follow him with observation. He will receive Zometa today. Mr. Vandevoorde will return for an office visit, bone survey, and Zometa in 3 months.  His daughter will contact us in the interim if there is a change in his condition. She reports he has exhibited recent behavior changes. He will be evaluated by his primary physician next week.  Betsy Coder, MD  05/26/2014  9:56 AM

## 2014-05-26 NOTE — Telephone Encounter (Signed)
Gave pt appt for lab,md and Zometa for September

## 2014-05-26 NOTE — Patient Instructions (Signed)

## 2014-05-26 NOTE — Progress Notes (Signed)
Recent fall down stairs at home when pets jumped on him with fracture of left wrist trying to break his fall. Reports his balance is "off at times". Made him aware that he is a fall risk now due to his age, balance and high risk for fracture. Asked daughter if PT consult would be of benefit-she declines at this time.

## 2014-05-26 NOTE — Telephone Encounter (Signed)
Per staff phone call and POF I have schedueld appts. Scheduler advised of appts.  JMW  

## 2014-07-07 ENCOUNTER — Encounter: Payer: Self-pay | Admitting: *Deleted

## 2014-07-12 ENCOUNTER — Ambulatory Visit
Admission: RE | Admit: 2014-07-12 | Discharge: 2014-07-12 | Disposition: A | Discharge status: Home / Self Care | Payer: Medicare Other | Source: Ambulatory Visit | Attending: Radiation Oncology | Admitting: Radiation Oncology

## 2014-07-12 ENCOUNTER — Encounter: Payer: Self-pay | Admitting: Radiation Oncology

## 2014-07-12 VITALS — BP 144/75 | HR 78 | Temp 98.4°F | Resp 20 | Wt 182.5 lb

## 2014-07-12 DIAGNOSIS — C320 Malignant neoplasm of glottis: Secondary | ICD-10-CM

## 2014-07-12 NOTE — Progress Notes (Addendum)
Patient denies pain, fatigue, loss of appetite, difficulty eating, swallowing. He was seen by Dr Janace Hoard Feb  2105 with good fiberoptic exam per Dr Peggyann Juba note. Patient recovering from fall in June with left forearm in a brace due to fracture of wrist. He states that since the fall he has headaches at the base of his neck occasionally.

## 2014-07-12 NOTE — Progress Notes (Signed)
CC: Dr. 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 He believes that his voice is somewhat improved. He underwent fiberoptic endoscopy with Dr. Janace Hoard on 01/12/2014 and he was without evidence for recurrent disease. He continues with close surveillance for his prostate cancer and he is on observation for his myeloma which is stable off therapy.  Physical examination: Alert and oriented. Filed Vitals:   07/12/14 1006  BP: 144/75  Pulse: 78  Temp: 98.4 F (36.9 C)  Resp: 20   Head and neck examination: There is no palpable lymphadenopathy in the neck. Oral cavity and oropharynx are unremarkable to inspection. On indirect mirror examination the true vocal cords move well and oppose one another at the midline. No visible evidence for recurrent disease.  Impression: No evidence for recurrent disease  Plan: Followup visit with Dr. Janace Hoard in 6 months, and followup visit here in one year.

## 2014-07-13 ENCOUNTER — Ambulatory Visit: Payer: Medicare Other | Admitting: Radiation Oncology

## 2014-08-23 ENCOUNTER — Other Ambulatory Visit: Payer: Self-pay | Admitting: *Deleted

## 2014-08-23 ENCOUNTER — Other Ambulatory Visit (HOSPITAL_BASED_OUTPATIENT_CLINIC_OR_DEPARTMENT_OTHER): Payer: Medicare Other

## 2014-08-23 ENCOUNTER — Ambulatory Visit (HOSPITAL_COMMUNITY)
Admission: RE | Admit: 2014-08-23 | Discharge: 2014-08-23 | Disposition: A | Discharge status: Home / Self Care | Payer: Medicare Other | Source: Ambulatory Visit | Attending: Oncology | Admitting: Oncology

## 2014-08-23 DIAGNOSIS — C9000 Multiple myeloma not having achieved remission: Secondary | ICD-10-CM | POA: Insufficient documentation

## 2014-08-23 LAB — COMPREHENSIVE METABOLIC PANEL (CC13)
ALBUMIN: 4.1 g/dL (ref 3.5–5.0)
ALT: 28 U/L (ref 0–55)
AST: 22 U/L (ref 5–34)
Alkaline Phosphatase: 60 U/L (ref 40–150)
Anion Gap: 9 mEq/L (ref 3–11)
BILIRUBIN TOTAL: 0.94 mg/dL (ref 0.20–1.20)
BUN: 15.4 mg/dL (ref 7.0–26.0)
CO2: 23 mEq/L (ref 22–29)
Calcium: 9.7 mg/dL (ref 8.4–10.4)
Chloride: 100 mEq/L (ref 98–109)
Creatinine: 1.1 mg/dL (ref 0.7–1.3)
GLUCOSE: 96 mg/dL (ref 70–140)
Potassium: 4 mEq/L (ref 3.5–5.1)
Sodium: 132 mEq/L — ABNORMAL LOW (ref 136–145)
TOTAL PROTEIN: 6.7 g/dL (ref 6.4–8.3)

## 2014-08-23 LAB — CBC WITH DIFFERENTIAL/PLATELET
BASO%: 0 % (ref 0.0–2.0)
BASOS ABS: 0 10*3/uL (ref 0.0–0.1)
EOS ABS: 0.1 10*3/uL (ref 0.0–0.5)
EOS%: 1.2 % (ref 0.0–7.0)
HCT: 40.4 % (ref 38.4–49.9)
HEMOGLOBIN: 14.1 g/dL (ref 13.0–17.1)
LYMPH#: 1.2 10*3/uL (ref 0.9–3.3)
LYMPH%: 23.6 % (ref 14.0–49.0)
MCH: 31.6 pg (ref 27.2–33.4)
MCHC: 34.9 g/dL (ref 32.0–36.0)
MCV: 90.6 fL (ref 79.3–98.0)
MONO#: 0.6 10*3/uL (ref 0.1–0.9)
MONO%: 11.8 % (ref 0.0–14.0)
NEUT#: 3.1 10*3/uL (ref 1.5–6.5)
NEUT%: 63.4 % (ref 39.0–75.0)
Platelets: 108 10*3/uL — ABNORMAL LOW (ref 140–400)
RBC: 4.46 10*6/uL (ref 4.20–5.82)
RDW: 13.4 % (ref 11.0–14.6)
WBC: 4.9 10*3/uL (ref 4.0–10.3)

## 2014-08-23 MED ORDER — OLMESARTAN MEDOXOMIL-HCTZ 20-12.5 MG PO TABS: 1.0000 | ORAL_TABLET | Freq: Every day | ORAL | Status: DC

## 2014-08-24 LAB — KAPPA/LAMBDA LIGHT CHAINS
KAPPA FREE LGHT CHN: 1.18 mg/dL (ref 0.33–1.94)
Kappa:Lambda Ratio: 0.02 — ABNORMAL LOW (ref 0.26–1.65)
Lambda Free Lght Chn: 72.1 mg/dL — ABNORMAL HIGH (ref 0.57–2.63)

## 2014-08-29 ENCOUNTER — Ambulatory Visit (HOSPITAL_BASED_OUTPATIENT_CLINIC_OR_DEPARTMENT_OTHER): Payer: Medicare Other | Admitting: Oncology

## 2014-08-29 ENCOUNTER — Telehealth: Payer: Self-pay | Admitting: Oncology

## 2014-08-29 ENCOUNTER — Ambulatory Visit (HOSPITAL_BASED_OUTPATIENT_CLINIC_OR_DEPARTMENT_OTHER): Payer: Medicare Other

## 2014-08-29 VITALS — BP 152/74 | HR 79 | Temp 98.2°F | Resp 20 | Ht 68.0 in | Wt 186.4 lb

## 2014-08-29 DIAGNOSIS — D696 Thrombocytopenia, unspecified: Secondary | ICD-10-CM

## 2014-08-29 DIAGNOSIS — C9000 Multiple myeloma not having achieved remission: Secondary | ICD-10-CM

## 2014-08-29 DIAGNOSIS — K219 Gastro-esophageal reflux disease without esophagitis: Secondary | ICD-10-CM

## 2014-08-29 DIAGNOSIS — I10 Essential (primary) hypertension: Secondary | ICD-10-CM

## 2014-08-29 DIAGNOSIS — D61818 Other pancytopenia: Secondary | ICD-10-CM

## 2014-08-29 DIAGNOSIS — I4891 Unspecified atrial fibrillation: Secondary | ICD-10-CM

## 2014-08-29 DIAGNOSIS — Z23 Encounter for immunization: Secondary | ICD-10-CM

## 2014-08-29 DIAGNOSIS — D801 Nonfamilial hypogammaglobulinemia: Secondary | ICD-10-CM

## 2014-08-29 MED ORDER — INFLUENZA VAC SPLIT QUAD 0.5 ML IM SUSY
0.5000 mL | PREFILLED_SYRINGE | Freq: Once | INTRAMUSCULAR | Status: AC
  Administered 2014-08-29: 0.5 mL via INTRAMUSCULAR
  Filled 2014-08-29: qty 0.5

## 2014-08-29 MED ORDER — SODIUM CHLORIDE 0.9 % IV SOLN
Freq: Once | INTRAVENOUS | Status: AC
  Administered 2014-08-29: 10:00:00 via INTRAVENOUS

## 2014-08-29 MED ORDER — ZOLEDRONIC ACID 4 MG/100ML IV SOLN
4.0000 mg | Freq: Once | INTRAVENOUS | Status: AC
  Administered 2014-08-29: 4 mg via INTRAVENOUS
  Filled 2014-08-29: qty 100

## 2014-08-29 NOTE — Telephone Encounter (Signed)
gv adn pritned appt sched and avs for pt for Dec...s.ed added tx.

## 2014-08-29 NOTE — Progress Notes (Signed)
  [place] OFFICE PROGRESS NOTE   Diagnosis: Multiple myeloma  INTERVAL HISTORY:   He returns as scheduled. He feels well. No pain. No complaint.  Objective:  Vital signs in last 24 hours:  Blood pressure 152/74, pulse 79, temperature 98.2 F (36.8 C), temperature source Oral, resp. rate 20, height $RemoveBe'5\' 8"'aFVjCdlbK$  (1.727 m), weight 186 lb 6.4 oz (84.55 kg). Lymphatics: No axillary nodes Resp: Lungs clear bilaterally Cardio: Regular rate and rhythm GI: No hepatosplenomegaly Vascular: No leg edema   Lab Results:  Lab Results  Component Value Date   WBC 4.9 08/23/2014   HGB 14.1 08/23/2014   HCT 40.4 08/23/2014   MCV 90.6 08/23/2014   PLT 108* 08/23/2014   NEUTROABS 3.1 08/23/2014   Creatinine 1.1, calcium 9.7, serum free lambda light chains 72.1  Imaging:  Metastatic bone survey 08/24/1999 HEENT: No evidence of progression of skeletal involvement by multiple myeloma.  Medications: I have reviewed the patient's current medications.  Assessment/Plan: 1. Multiple myeloma: The serum free light chains were elevated 08/29/2009. A CT of the chest 08/31/2009 showed a new pleural mass consistent with progression of multiple myeloma. He completed 12 cycles of Revlimid/Decadron. The serum free light chains were improved to 4.34 on 12/29/2009. A restaging CT of the chest 02/19/2010 showed resolution of right axillary lymphadenopathy and a pleural-based mass. The serum free lambda light chains are now elevated. 2. History of back pain, likely related to the pleural-based mass at the right chest, resolved. 3. Right axillary/subpectoral lymphadenopathy on a CT of the chest 08/31/2009, likely related to multiple myeloma, resolved. 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 He is maintained on every three-month Zometa. 5. Pancytopenia secondary to Revlimid and multiple myeloma. He has persistent mild thrombocytopenia. 6. Hypertension. 7. Status post hip  replacement. 8. History of back surgery. 9. History of trigeminal neuralgia. 10. Status post removal of a lipoma from the right axilla in February 2010. 11. 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 12. Esophageal reflux disease, followed by Dr. Janace Hoard.  13. Esophageal stricture, status post an evaluation by Dr. Henrene Pastor. 14. Right vocal cord lesion, status post a biopsy by Dr. 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 He completed radiation under the direction of Dr. Valere Dross on 03/15/2011. 15. Admission with pneumonia 03/26/2010. 16. Atrial flutter/fibrillation while hospitalized August 2011. He is followed by Dr. Percival Spanish. 17. Elevated prostate specific antigen, status post a biopsy 01/23/2011. He was found to have a Gleason 6 cancer in 10% of the cores. He is followed with an observation approach. He is now followed by Dr. Diona Fanti.  18. History of Mild hypercalcemia  19. Fall with a left wrist fracture June 2015    Disposition:  Richard [name] remains asymptomatic from the multiple myeloma. The serum light chains have been higher over the past year. He understands he will likely need treatment for myeloma in the future. We decided to continue observation for now since he is asymptomatic and stable from a hematologic standpoint. Richard [name] will receive Zometa today. He will also receive an influenza vaccine.  Richard [name] will return for office and lab visit in 3 months.  Betsy Coder, MD  08/29/2014  9:08 AM

## 2014-08-29 NOTE — Patient Instructions (Signed)

## 2014-11-21 ENCOUNTER — Other Ambulatory Visit (HOSPITAL_BASED_OUTPATIENT_CLINIC_OR_DEPARTMENT_OTHER): Payer: Medicare Other

## 2014-11-21 DIAGNOSIS — C9000 Multiple myeloma not having achieved remission: Secondary | ICD-10-CM

## 2014-11-21 LAB — CBC WITH DIFFERENTIAL/PLATELET
BASO%: 0 % (ref 0.0–2.0)
Basophils Absolute: 0 10*3/uL (ref 0.0–0.1)
EOS%: 2.5 % (ref 0.0–7.0)
Eosinophils Absolute: 0.1 10*3/uL (ref 0.0–0.5)
HCT: 37.9 % — ABNORMAL LOW (ref 38.4–49.9)
HGB: 13.1 g/dL (ref 13.0–17.1)
LYMPH#: 1.6 10*3/uL (ref 0.9–3.3)
LYMPH%: 33.1 % (ref 14.0–49.0)
MCH: 32.6 pg (ref 27.2–33.4)
MCHC: 34.6 g/dL (ref 32.0–36.0)
MCV: 94.3 fL (ref 79.3–98.0)
MONO#: 0.5 10*3/uL (ref 0.1–0.9)
MONO%: 11 % (ref 0.0–14.0)
NEUT#: 2.6 10*3/uL (ref 1.5–6.5)
NEUT%: 53.4 % (ref 39.0–75.0)
PLATELETS: 101 10*3/uL — AB (ref 140–400)
RBC: 4.02 10*6/uL — AB (ref 4.20–5.82)
RDW: 14.3 % (ref 11.0–14.6)
WBC: 4.8 10*3/uL (ref 4.0–10.3)

## 2014-11-21 LAB — COMPREHENSIVE METABOLIC PANEL (CC13)
ALBUMIN: 4 g/dL (ref 3.5–5.0)
ALT: 36 U/L (ref 0–55)
ANION GAP: 10 meq/L (ref 3–11)
AST: 26 U/L (ref 5–34)
Alkaline Phosphatase: 76 U/L (ref 40–150)
BUN: 18.5 mg/dL (ref 7.0–26.0)
CALCIUM: 9.8 mg/dL (ref 8.4–10.4)
CHLORIDE: 103 meq/L (ref 98–109)
CO2: 25 meq/L (ref 22–29)
Creatinine: 1 mg/dL (ref 0.7–1.3)
EGFR: 69 mL/min/{1.73_m2} — AB (ref >90)
GLUCOSE: 97 mg/dL (ref 70–140)
POTASSIUM: 4 meq/L (ref 3.5–5.1)
SODIUM: 137 meq/L (ref 136–145)
TOTAL PROTEIN: 6.3 g/dL — AB (ref 6.4–8.3)
Total Bilirubin: 0.72 mg/dL (ref 0.20–1.20)

## 2014-11-22 LAB — KAPPA/LAMBDA LIGHT CHAINS
Kappa free light chain: 1.17 mg/dL (ref 0.33–1.94)
Kappa:Lambda Ratio: 0 — ABNORMAL LOW (ref 0.26–1.65)
Lambda Free Lght Chn: 265 mg/dL — ABNORMAL HIGH (ref 0.57–2.63)

## 2014-11-28 ENCOUNTER — Telehealth: Payer: Self-pay | Admitting: Oncology

## 2014-11-28 ENCOUNTER — Ambulatory Visit (HOSPITAL_COMMUNITY)
Admission: RE | Admit: 2014-11-28 | Discharge: 2014-11-28 | Disposition: A | Discharge status: Home / Self Care | Payer: Medicare Other | Source: Ambulatory Visit | Attending: Oncology | Admitting: Oncology

## 2014-11-28 ENCOUNTER — Other Ambulatory Visit: Payer: Self-pay | Admitting: *Deleted

## 2014-11-28 ENCOUNTER — Ambulatory Visit (HOSPITAL_BASED_OUTPATIENT_CLINIC_OR_DEPARTMENT_OTHER): Payer: Medicare Other

## 2014-11-28 ENCOUNTER — Ambulatory Visit (HOSPITAL_BASED_OUTPATIENT_CLINIC_OR_DEPARTMENT_OTHER): Payer: Medicare Other | Admitting: Oncology

## 2014-11-28 VITALS — BP 148/66 | HR 89 | Temp 98.2°F | Resp 19 | Ht 68.0 in | Wt 187.1 lb

## 2014-11-28 DIAGNOSIS — I2510 Atherosclerotic heart disease of native coronary artery without angina pectoris: Secondary | ICD-10-CM | POA: Diagnosis not present

## 2014-11-28 DIAGNOSIS — R0789 Other chest pain: Secondary | ICD-10-CM | POA: Diagnosis present

## 2014-11-28 DIAGNOSIS — M4853XA Collapsed vertebra, not elsewhere classified, cervicothoracic region, initial encounter for fracture: Secondary | ICD-10-CM | POA: Insufficient documentation

## 2014-11-28 DIAGNOSIS — C320 Malignant neoplasm of glottis: Secondary | ICD-10-CM | POA: Insufficient documentation

## 2014-11-28 DIAGNOSIS — K8020 Calculus of gallbladder without cholecystitis without obstruction: Secondary | ICD-10-CM | POA: Diagnosis not present

## 2014-11-28 DIAGNOSIS — C9000 Multiple myeloma not having achieved remission: Secondary | ICD-10-CM

## 2014-11-28 DIAGNOSIS — I700 Atherosclerosis of aorta: Secondary | ICD-10-CM | POA: Insufficient documentation

## 2014-11-28 DIAGNOSIS — R079 Chest pain, unspecified: Secondary | ICD-10-CM

## 2014-11-28 DIAGNOSIS — Y33XXXA Other specified events, undetermined intent, initial encounter: Secondary | ICD-10-CM | POA: Insufficient documentation

## 2014-11-28 MED ORDER — ZOLEDRONIC ACID 4 MG/100ML IV SOLN
4.0000 mg | Freq: Once | INTRAVENOUS | Status: AC
  Administered 2014-11-28: 4 mg via INTRAVENOUS
  Filled 2014-11-28: qty 100

## 2014-11-28 MED ORDER — SODIUM CHLORIDE 0.9 % IV SOLN
Freq: Once | INTRAVENOUS | Status: AC
  Administered 2014-11-28: 12:00:00 via INTRAVENOUS

## 2014-11-28 NOTE — Progress Notes (Signed)
[place] OFFICE PROGRESS NOTE   Diagnosis: Multiple myeloma  INTERVAL HISTORY:   Richard [name] returns as scheduled. He complains of soreness at the right anterolateral chest wall for the past few weeks. He is not taking pain medication. No other complaint.  Objective:  Vital signs in last 24 hours:  Blood pressure 148/66, pulse 89, temperature 98.2 F (36.8 C), temperature source Oral, resp. rate 19, height 5' 8"  (1.727 m), weight 187 lb 1.6 oz (84.868 kg), SpO2 100 %.    Lymphatics: No cervical, supraclavicular, or axillary nodes Resp: Lungs clear bilaterally Cardio: Regular rate and rhythm GI: No hepatosplenomegaly Vascular: No leg edema Musculoskeletal: Tender at the right anterior chest wall lateral to the right breast. No mass.    Lab Results:  Lab Results  Component Value Date   WBC 4.8 11/21/2014   HGB 13.1 11/21/2014   HCT 37.9* 11/21/2014   MCV 94.3 11/21/2014   PLT 101* 11/21/2014   NEUTROABS 2.6 11/21/2014    Creatinine 1.0, calcium 9.8, albumin 4.0, free lambda light chains 265 Medications: I have reviewed the patient's current medications.  Assessment/Plan: 1. Multiple myeloma: The serum free light chains were elevated 08/29/2009. A CT of the chest 08/31/2009 showed a new pleural mass consistent with progression of multiple myeloma. He completed 12 cycles of Revlimid/Decadron. The serum free light chains were improved to 4.34 on 12/29/2009. A restaging CT of the chest 02/19/2010 showed resolution of right axillary lymphadenopathy and a pleural-based mass. The serum free lambda light chains are now elevated. 2. History of back pain, likely related to the pleural-based mass at the right chest, resolved. 3. Right axillary/subpectoral lymphadenopathy on a CT of the chest 08/31/2009, likely related to multiple myeloma, resolved. 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 He is maintained on every three-month  Zometa. 5. Pancytopenia secondary to Revlimid and multiple myeloma. He has persistent mild thrombocytopenia. 6. Hypertension. 7. Status post hip replacement. 8. History of back surgery. 9. History of trigeminal neuralgia. 10. Status post removal of a lipoma from the right axilla in February 2010. 11. 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 12. Esophageal reflux disease, followed by Dr. Janace Hoard.  13. Esophageal stricture, status post an evaluation by Dr. Henrene Pastor. 14. Right vocal cord lesion, status post a biopsy by Dr. 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 He completed radiation under the direction of Dr. Valere Dross on 03/15/2011. 15. Admission with pneumonia 03/26/2010. 16. Atrial flutter/fibrillation while hospitalized August 2011. He is followed by Dr. Percival Spanish. 17. Elevated prostate specific antigen, status post a biopsy 01/23/2011. He was found to have a Gleason 6 cancer in 10% of the cores. He is followed with an observation approach. He is now followed by Dr. Diona Fanti. 18. History of Mild hypercalcemia  19. Fall with a left wrist fracture June 2015  20. Right chest wall pain-most likely secondary to multiple myeloma involving the chest wall, plasmacytoma?    Disposition:  Richard [name] has multiple myeloma. The light chains are higher and the hemoglobin is slightly lower. He now has pain at the right chest wall. The pain is most likely secondary to multiple myeloma. He will be referred for a chest CT to evaluate the right chest wall.  I recommend resuming Revlimid/Decadron. He tolerated the Revlimid and Decadron well in the past. We reviewed the potential toxicities associated with this regimen including the chance for nausea, diarrhea, neuropathy, hematologic toxicity, thromboembolic disease, and an altered mental status. He agrees to  proceed. The plan is to begin a first cycle of Revlimid/Decadron 12/05/2014.  Richard [name] will  receive Zometa today. He will return for an office visit 12/27/2014.  Betsy Coder, MD  11/28/2014  11:02 AM

## 2014-11-28 NOTE — Telephone Encounter (Signed)
gv adn printed appt sched and avs for pt for Jan 2016 °

## 2014-11-28 NOTE — Telephone Encounter (Signed)
S/w radiology scheduled CT for today after treatment. Took schedule to pt in chemo room..... Richard [name]

## 2014-11-28 NOTE — Patient Instructions (Signed)

## 2014-11-29 ENCOUNTER — Telehealth: Payer: Self-pay | Admitting: *Deleted

## 2014-11-29 ENCOUNTER — Other Ambulatory Visit: Payer: Self-pay | Admitting: *Deleted

## 2014-11-29 MED ORDER — DEXAMETHASONE 4 MG PO TABS: 40.0000 mg | ORAL_TABLET | ORAL | Status: DC

## 2014-11-29 MED ORDER — LENALIDOMIDE 25 MG PO CAPS: 25.0000 mg | ORAL_CAPSULE | Freq: Every day | ORAL | Status: DC

## 2014-11-29 NOTE — Telephone Encounter (Signed)
Per Dr. Benay Spice; notified pt's daughter CT confirms right 2nd rib plasmacytoma, proceed with Revlimid/decadron as scheduled and if pain is not better will refer for xrt.  [name] daughter verbalized understanding of information.

## 2014-11-29 NOTE — Telephone Encounter (Signed)
-----   Message from Ladell Pier, MD sent at 11/28/2014  7:00 PM EST ----- Please call patient, CT confirms a Rt. 2nd rib plasmacytoma, proceed with Rev/dex as scheduled. We will refer for xrt if the pain is not better,

## 2014-12-05 ENCOUNTER — Encounter: Payer: Self-pay | Admitting: Oncology

## 2014-12-05 ENCOUNTER — Other Ambulatory Visit: Payer: Self-pay | Admitting: *Deleted

## 2014-12-05 NOTE — Progress Notes (Addendum)
AMY BROUGHT UP A RX FOR REVLIMID THAT HAD BEEN FAXED TO [facility].  I DID AN ONLINE FORM FOR ASSISTANCE AND SPOKE TO JOSEPH AS DEBBY WAS OUT. THE ASSISTANCE HAS BEEN APPROVED AND IT WILL BE SENT TO [facility] FOR IMMEDIATE SHIPPING.  SPOKE TO DAUGHTER [phone] TWICE AND SHE IS OK.  12/06/14 I CALLED AND DID THE PA OVER THE PHONE WITH SUSAN, RN.  IT HAS BEEN APPROVED FROM 12/05/14 TO 12/07/15.  CALLED [facility] AND SPOKE TO SYLVIA. SHE CALLED BACK AND SAID HE HAD A 0.00 CO-PAY AND THEY WILL CALL MELINDA.  I CALLED HER AND LET HER KNOW THE STATUS OF THE REVLIMID.  12/07/14 SYLVIA CALLED AND THEY HAVE NOT REACHED THE DAUGHTER.  LEFT MESSAGE FOR MELINDA TO CALL [facility].

## 2014-12-06 NOTE — Progress Notes (Signed)
RECEIVED A FAX FROM [facility] CONCERNING A CONFIRMATION OF FACSIMILE RECEIPT FOR PT. REFERRAL. 

## 2014-12-20 ENCOUNTER — Other Ambulatory Visit (HOSPITAL_BASED_OUTPATIENT_CLINIC_OR_DEPARTMENT_OTHER): Payer: BLUE CROSS/BLUE SHIELD

## 2014-12-20 DIAGNOSIS — C9000 Multiple myeloma not having achieved remission: Secondary | ICD-10-CM

## 2014-12-20 LAB — CBC WITH DIFFERENTIAL/PLATELET
BASO%: 0.3 % (ref 0.0–2.0)
BASOS ABS: 0 10*3/uL (ref 0.0–0.1)
EOS ABS: 0.1 10*3/uL (ref 0.0–0.5)
EOS%: 1.2 % (ref 0.0–7.0)
HCT: 34.2 % — ABNORMAL LOW (ref 38.4–49.9)
HEMOGLOBIN: 11.6 g/dL — AB (ref 13.0–17.1)
LYMPH%: 15.3 % (ref 14.0–49.0)
MCH: 32 pg (ref 27.2–33.4)
MCHC: 33.9 g/dL (ref 32.0–36.0)
MCV: 94.2 fL (ref 79.3–98.0)
MONO#: 1.1 10*3/uL — ABNORMAL HIGH (ref 0.1–0.9)
MONO%: 14.2 % — AB (ref 0.0–14.0)
NEUT#: 5.2 10*3/uL (ref 1.5–6.5)
NEUT%: 69 % (ref 39.0–75.0)
PLATELETS: 100 10*3/uL — AB (ref 140–400)
RBC: 3.63 10*6/uL — ABNORMAL LOW (ref 4.20–5.82)
RDW: 13.9 % (ref 11.0–14.6)
WBC: 7.6 10*3/uL (ref 4.0–10.3)
lymph#: 1.2 10*3/uL (ref 0.9–3.3)

## 2014-12-20 LAB — COMPREHENSIVE METABOLIC PANEL (CC13)
ALT: 22 U/L (ref 0–55)
AST: 16 U/L (ref 5–34)
Albumin: 3.8 g/dL (ref 3.5–5.0)
Alkaline Phosphatase: 62 U/L (ref 40–150)
Anion Gap: 6 mEq/L (ref 3–11)
BILIRUBIN TOTAL: 1.13 mg/dL (ref 0.20–1.20)
BUN: 21.5 mg/dL (ref 7.0–26.0)
CALCIUM: 9.6 mg/dL (ref 8.4–10.4)
CO2: 29 meq/L (ref 22–29)
Chloride: 92 mEq/L — ABNORMAL LOW (ref 98–109)
Creatinine: 1.2 mg/dL (ref 0.7–1.3)
EGFR: 59 mL/min/{1.73_m2} — ABNORMAL LOW (ref >90)
GLUCOSE: 104 mg/dL (ref 70–140)
Potassium: 3.9 mEq/L (ref 3.5–5.1)
Sodium: 127 mEq/L — ABNORMAL LOW (ref 136–145)
TOTAL PROTEIN: 5.9 g/dL — AB (ref 6.4–8.3)

## 2014-12-21 ENCOUNTER — Other Ambulatory Visit: Payer: Self-pay | Admitting: *Deleted

## 2014-12-21 DIAGNOSIS — C9000 Multiple myeloma not having achieved remission: Secondary | ICD-10-CM

## 2014-12-21 LAB — KAPPA/LAMBDA LIGHT CHAINS
Kappa free light chain: 1.95 mg/dL — ABNORMAL HIGH (ref 0.33–1.94)
Kappa:Lambda Ratio: 0.03 — ABNORMAL LOW (ref 0.26–1.65)
Lambda Free Lght Chn: 56.5 mg/dL — ABNORMAL HIGH (ref 0.57–2.63)

## 2014-12-22 ENCOUNTER — Telehealth: Payer: Self-pay | Admitting: Oncology

## 2014-12-22 NOTE — Telephone Encounter (Signed)
S/w pt's daughter confirming labs added before MD visit per 01/13 POF.... Cherylann Banas

## 2014-12-27 ENCOUNTER — Telehealth: Payer: Self-pay | Admitting: Oncology

## 2014-12-27 ENCOUNTER — Other Ambulatory Visit (HOSPITAL_BASED_OUTPATIENT_CLINIC_OR_DEPARTMENT_OTHER): Payer: Medicare Other

## 2014-12-27 ENCOUNTER — Other Ambulatory Visit: Payer: Medicare Other

## 2014-12-27 ENCOUNTER — Ambulatory Visit (HOSPITAL_BASED_OUTPATIENT_CLINIC_OR_DEPARTMENT_OTHER): Payer: Medicare Other | Admitting: Oncology

## 2014-12-27 ENCOUNTER — Other Ambulatory Visit: Payer: Self-pay | Admitting: *Deleted

## 2014-12-27 VITALS — BP 137/58 | HR 80 | Temp 98.9°F | Resp 18 | Ht 68.0 in | Wt 176.1 lb

## 2014-12-27 DIAGNOSIS — K219 Gastro-esophageal reflux disease without esophagitis: Secondary | ICD-10-CM

## 2014-12-27 DIAGNOSIS — C9000 Multiple myeloma not having achieved remission: Secondary | ICD-10-CM

## 2014-12-27 DIAGNOSIS — R0789 Other chest pain: Secondary | ICD-10-CM

## 2014-12-27 DIAGNOSIS — D701 Agranulocytosis secondary to cancer chemotherapy: Secondary | ICD-10-CM

## 2014-12-27 DIAGNOSIS — D696 Thrombocytopenia, unspecified: Secondary | ICD-10-CM

## 2014-12-27 DIAGNOSIS — E871 Hypo-osmolality and hyponatremia: Secondary | ICD-10-CM

## 2014-12-27 LAB — BASIC METABOLIC PANEL (CC13)
ANION GAP: 9 meq/L (ref 3–11)
BUN: 20.1 mg/dL (ref 7.0–26.0)
CALCIUM: 9.2 mg/dL (ref 8.4–10.4)
CO2: 26 meq/L (ref 22–29)
CREATININE: 1.1 mg/dL (ref 0.7–1.3)
Chloride: 89 mEq/L — ABNORMAL LOW (ref 98–109)
EGFR: 62 mL/min/{1.73_m2} — AB (ref >90)
GLUCOSE: 106 mg/dL (ref 70–140)
Potassium: 3.5 mEq/L (ref 3.5–5.1)
Sodium: 124 mEq/L — ABNORMAL LOW (ref 136–145)

## 2014-12-27 MED ORDER — DEXAMETHASONE 4 MG PO TABS: 20.0000 mg | ORAL_TABLET | ORAL | Status: DC

## 2014-12-27 MED ORDER — LENALIDOMIDE 25 MG PO CAPS: 25.0000 mg | ORAL_CAPSULE | Freq: Every day | ORAL | Status: DC

## 2014-12-27 MED ORDER — PANTOPRAZOLE SODIUM 40 MG PO TBEC: 40.0000 mg | DELAYED_RELEASE_TABLET | Freq: Every day | ORAL | Status: DC

## 2014-12-27 NOTE — Telephone Encounter (Signed)
gv adn printed appt sched and avs forpt for Jan adn Feb.....   °

## 2014-12-27 NOTE — Progress Notes (Signed)
[place] OFFICE PROGRESS NOTE   Diagnosis: Multiple myeloma  INTERVAL HISTORY:   Richard [name] returns as scheduled. He began a cycle of Revlimid/Decadron 12/08/2014. He reports intermittent numbness in the fingers since beginning Revlimid. He complains of insomnia. His chief complaint is "heartburn ". This has not been relieved with Pepcid. He complains of pruritus in the right ear. He continues to have pain at the right upper chest.  Objective:  Vital signs in last 24 hours:  Blood pressure 137/58, pulse 80, temperature 98.9 F (37.2 C), temperature source Oral, resp. rate 18, height _0  (1.727 m), weight 176 lb 1.6 oz (79.878 kg), SpO2 97 %.    HEENT: No thrush or ulcers, cerumen at the right external canal, the left ear has a minimal amount of cerumen with a clear tympanic membrane Resp: Lungs clear bilaterally Cardio: Regular rate and rhythm GI: No hepatomegaly, nontender, no mass Vascular: No leg edema Neuro: Alert and oriented  Skin: Mildly diminished skin turgor and arms Musculoskeletal: No mass at the right axilla or right chest wall   Portacath/PICC-without erythema  Lab Results:  Lab Results  Component Value Date   WBC 7.6 12/20/2014   HGB 11.6* 12/20/2014   HCT 34.2* 12/20/2014   MCV 94.2 12/20/2014   PLT 100* 12/20/2014   NEUTROABS 5.2 12/20/2014   Sodium 124, creatinine 1.1, BUN 20.1, calcium 9.2, potassium 3.5   12/20/2014-serum free lambda light chains 56.5   Imaging: CT of the chest 11/28/2014-large lytic lesion at the right second rib, new compression fracture at T2  Medications: I have reviewed the patient's current medications.  Assessment/Plan: 1. Multiple myeloma: The serum free light chains were elevated 08/29/2009. A CT of the chest 08/31/2009 showed a new pleural mass consistent with progression of multiple myeloma. He completed 12 cycles of Revlimid/Decadron. The serum free light chains were improved to 4.34 on  12/29/2009. A restaging CT of the chest 02/19/2010 showed resolution of right axillary lymphadenopathy and a pleural-based mass. The serum free lambda light chains are now elevated.  Salvage Revlimid/Decadron initiated 12/08/2014 2. History of back pain, likely related to the pleural-based mass at the right chest, resolved. 3. Right axillary/subpectoral lymphadenopathy on a CT of the chest 08/31/2009, likely related to multiple myeloma, resolved. 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 He is maintained on every three-month Zometa. 5. Pancytopenia secondary to Revlimid and multiple myeloma. He has persistent mild thrombocytopenia. 6. Hypertension. 7. Status post hip replacement. 8. History of back surgery. 9. History of trigeminal neuralgia. 10. Status post removal of a lipoma from the right axilla in February 2010. 11. 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 12. Esophageal reflux disease, followed by Dr. Janace Hoard.  13. Esophageal stricture, status post an evaluation by Dr. Henrene Pastor. 14. Right vocal cord lesion, status post a biopsy by Dr. 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 He completed radiation under the direction of Dr. Valere Dross on 03/15/2011. 15. Admission with pneumonia 03/26/2010. 16. Atrial flutter/fibrillation while hospitalized August 2011. He is followed by Dr. Percival Spanish. 17. Elevated prostate specific antigen, status post a biopsy 01/23/2011. He was found to have a Gleason 6 cancer in 10% of the cores. He is followed with an observation approach. He is now followed by Dr. Diona Fanti. 18. History of Mild hypercalcemia  19. Fall with a left wrist fracture June 2015  20. Right chest wall pain secondary to a right second rib plasmacytoma confirmed on a CT 11/28/2014 21. Hyponatremia-this may  be related to dehydration, polypharmacy, or another cause.   Disposition:  Mr. Barham will complete a first cycle of  salvage Revlimid/Decadron over the next few days. The serum free light chains were lower last week. He will begin another cycle of Revlimid on 01/05/2015. We dose reduce the Decadron to 35m daily.  I encouraged him to increase his diet. He will return for a repeat chemistry panel next week. We will prescribe Protonix for the reflux symptoms. This may have been worsened by Decadron.  Mr. RRuddockwill be scheduled for an office visit following cycle 2 Revlimid/Decadron. His daughter will contact [place] the reflux symptoms and his appetite do not improve.   SBetsy Coder MD  12/27/2014  1:57 PM

## 2014-12-28 ENCOUNTER — Other Ambulatory Visit: Payer: Self-pay | Admitting: *Deleted

## 2014-12-28 ENCOUNTER — Telehealth: Payer: Self-pay | Admitting: *Deleted

## 2014-12-28 NOTE — Telephone Encounter (Signed)
Received call from daughter Sandria Senter re:  Pt saw Dr. Benay Spice yesterday and was told that prescription for Protonix will be called in to [facility] for pt to help with reflux symptoms.  Rip Harbour also would like refill of Revlimid for pt to start next cycle on 01/05/15.  Informed Rip Harbour that message will be sent to Dr. Benay Spice for review. Melinda's  Phone    [phone].

## 2014-12-28 NOTE — Telephone Encounter (Signed)
Per Dr. Benay Spice; Protonix and Revlimid were re-filled 12/27/14.

## 2015-01-04 ENCOUNTER — Other Ambulatory Visit (HOSPITAL_BASED_OUTPATIENT_CLINIC_OR_DEPARTMENT_OTHER): Payer: Medicare Other

## 2015-01-04 DIAGNOSIS — C9000 Multiple myeloma not having achieved remission: Secondary | ICD-10-CM

## 2015-01-04 LAB — BASIC METABOLIC PANEL (CC13)
Anion Gap: 7 mEq/L (ref 3–11)
BUN: 19.3 mg/dL (ref 7.0–26.0)
CALCIUM: 9.3 mg/dL (ref 8.4–10.4)
CHLORIDE: 97 meq/L — AB (ref 98–109)
CO2: 24 mEq/L (ref 22–29)
CREATININE: 1 mg/dL (ref 0.7–1.3)
EGFR: 71 mL/min/{1.73_m2} — AB (ref >90)
Glucose: 95 mg/dl (ref 70–140)
POTASSIUM: 4 meq/L (ref 3.5–5.1)
SODIUM: 128 meq/L — AB (ref 136–145)

## 2015-01-04 LAB — CBC WITH DIFFERENTIAL/PLATELET
BASO%: 0.4 % (ref 0.0–2.0)
Basophils Absolute: 0 10*3/uL (ref 0.0–0.1)
EOS ABS: 0 10*3/uL (ref 0.0–0.5)
EOS%: 0.4 % (ref 0.0–7.0)
HCT: 29.8 % — ABNORMAL LOW (ref 38.4–49.9)
HEMOGLOBIN: 10.5 g/dL — AB (ref 13.0–17.1)
LYMPH%: 19.9 % (ref 14.0–49.0)
MCH: 32.5 pg (ref 27.2–33.4)
MCHC: 35.2 g/dL (ref 32.0–36.0)
MCV: 92.3 fL (ref 79.3–98.0)
MONO#: 0.7 10*3/uL (ref 0.1–0.9)
MONO%: 10.7 % (ref 0.0–14.0)
NEUT%: 68.6 % (ref 39.0–75.0)
NEUTROS ABS: 4.6 10*3/uL (ref 1.5–6.5)
PLATELETS: 155 10*3/uL (ref 140–400)
RBC: 3.23 10*6/uL — ABNORMAL LOW (ref 4.20–5.82)
RDW: 14.6 % (ref 11.0–14.6)
WBC: 6.7 10*3/uL (ref 4.0–10.3)
lymph#: 1.3 10*3/uL (ref 0.9–3.3)

## 2015-01-05 ENCOUNTER — Telehealth: Payer: Self-pay | Admitting: *Deleted

## 2015-01-05 NOTE — Telephone Encounter (Signed)
Daughter Rip Harbour returned collaborative call.  This nurse spoke with her.  Answered questions about what is current lab value and how to increase sodium levels.  This nurse advised to drink sports drinks and eat foods with more sodium.

## 2015-01-05 NOTE — Telephone Encounter (Signed)
-----   Message from Ladell Pier, MD sent at 01/04/2015  6:41 PM EST ----- Please call patient, sodium is better, proceed with cycle 2 revlimid

## 2015-01-09 ENCOUNTER — Telehealth: Payer: Self-pay | Admitting: *Deleted

## 2015-01-09 NOTE — Telephone Encounter (Signed)
Per Dr. Benay Spice; notified pt daughter sodium better and proceed with cycle 2 revlimid.   [name] daughter states she did speak with someone Thursday and received information and pt has started Revlimid on Thursday and is doing well.  Confirmed appt for 01/24/15.

## 2015-01-09 NOTE — Telephone Encounter (Signed)
-----   Message from Ladell Pier, MD sent at 01/04/2015  6:41 PM EST ----- Please call patient, sodium is better, proceed with cycle 2 revlimid

## 2015-01-18 ENCOUNTER — Telehealth: Payer: Self-pay | Admitting: *Deleted

## 2015-01-18 NOTE — Telephone Encounter (Signed)
Received fax from Dr. Diona Fanti requesting PSA to be drawn at next lab visit. Request to phlebotomists.

## 2015-01-24 ENCOUNTER — Other Ambulatory Visit (HOSPITAL_BASED_OUTPATIENT_CLINIC_OR_DEPARTMENT_OTHER): Payer: Medicare Other

## 2015-01-24 ENCOUNTER — Telehealth: Payer: Self-pay | Admitting: Oncology

## 2015-01-24 ENCOUNTER — Telehealth: Payer: Self-pay | Admitting: *Deleted

## 2015-01-24 ENCOUNTER — Ambulatory Visit (HOSPITAL_BASED_OUTPATIENT_CLINIC_OR_DEPARTMENT_OTHER): Payer: Medicare Other | Admitting: Oncology

## 2015-01-24 VITALS — BP 145/60 | HR 88 | Temp 98.2°F | Resp 18 | Ht 68.0 in | Wt 180.4 lb

## 2015-01-24 DIAGNOSIS — E871 Hypo-osmolality and hyponatremia: Secondary | ICD-10-CM

## 2015-01-24 DIAGNOSIS — C9000 Multiple myeloma not having achieved remission: Secondary | ICD-10-CM

## 2015-01-24 DIAGNOSIS — D696 Thrombocytopenia, unspecified: Secondary | ICD-10-CM

## 2015-01-24 DIAGNOSIS — C61 Malignant neoplasm of prostate: Secondary | ICD-10-CM

## 2015-01-24 LAB — CBC WITH DIFFERENTIAL/PLATELET
BASO%: 1.3 % (ref 0.0–2.0)
Basophils Absolute: 0.1 10*3/uL (ref 0.0–0.1)
EOS%: 5.4 % (ref 0.0–7.0)
Eosinophils Absolute: 0.3 10*3/uL (ref 0.0–0.5)
HEMATOCRIT: 31.8 % — AB (ref 38.4–49.9)
HGB: 10.7 g/dL — ABNORMAL LOW (ref 13.0–17.1)
LYMPH%: 19.8 % (ref 14.0–49.0)
MCH: 31.8 pg (ref 27.2–33.4)
MCHC: 33.6 g/dL (ref 32.0–36.0)
MCV: 94.5 fL (ref 79.3–98.0)
MONO#: 0.5 10*3/uL (ref 0.1–0.9)
MONO%: 10 % (ref 0.0–14.0)
NEUT#: 3.3 10*3/uL (ref 1.5–6.5)
NEUT%: 63.5 % (ref 39.0–75.0)
Platelets: 111 10*3/uL — ABNORMAL LOW (ref 140–400)
RBC: 3.36 10*6/uL — ABNORMAL LOW (ref 4.20–5.82)
RDW: 15.3 % — ABNORMAL HIGH (ref 11.0–14.6)
WBC: 5.2 10*3/uL (ref 4.0–10.3)
lymph#: 1 10*3/uL (ref 0.9–3.3)

## 2015-01-24 LAB — COMPREHENSIVE METABOLIC PANEL (CC13)
ALK PHOS: 66 U/L (ref 40–150)
ALT: 23 U/L (ref 0–55)
ANION GAP: 10 meq/L (ref 3–11)
AST: 14 U/L (ref 5–34)
Albumin: 3.6 g/dL (ref 3.5–5.0)
BUN: 18 mg/dL (ref 7.0–26.0)
CALCIUM: 9.4 mg/dL (ref 8.4–10.4)
CHLORIDE: 97 meq/L — AB (ref 98–109)
CO2: 26 mEq/L (ref 22–29)
Creatinine: 1 mg/dL (ref 0.7–1.3)
EGFR: 73 mL/min/{1.73_m2} — ABNORMAL LOW (ref >90)
Glucose: 100 mg/dl (ref 70–140)
POTASSIUM: 3.6 meq/L (ref 3.5–5.1)
Sodium: 133 mEq/L — ABNORMAL LOW (ref 136–145)
Total Bilirubin: 1.27 mg/dL — ABNORMAL HIGH (ref 0.20–1.20)
Total Protein: 6 g/dL — ABNORMAL LOW (ref 6.4–8.3)

## 2015-01-24 NOTE — Progress Notes (Signed)
  [place] OFFICE PROGRESS NOTE   Diagnosis: Multiple myeloma  INTERVAL HISTORY:   Richard [name] returns as scheduled. He will complete another cycle of Revlimid on 01/25/2015. He feels well. The right chest discomfort has resolved.  Objective:  Vital signs in last 24 hours:  Blood pressure 145/60, pulse 88, temperature 98.2 F (36.8 C), temperature source Oral, resp. rate 18, height $RemoveBe'5\' 8"'[id number]$  (1.727 m), weight 180 lb 6.4 oz (81.829 kg), SpO2 99 %.    HEENT: No thrush or ulcers Resp: Lungs clear bilaterally Cardio: Regular rate and rhythm GI: No hepatomegaly, nontender Vascular: No leg edema Musculoskeletal: No right chest wall tenderness    Lab Results:  Lab Results  Component Value Date   WBC 5.2 01/24/2015   HGB 10.7* 01/24/2015   HCT 31.8* 01/24/2015   MCV 94.5 01/24/2015   PLT 111* 01/24/2015   NEUTROABS 3.3 01/24/2015   sodium 133, potassium 3.6, creatinine 1.0, bilirubin 1.27   Medications: I have reviewed the patient's current medications.  Assessment/Plan: 1. Multiple myeloma: The serum free light chains were elevated 08/29/2009. A CT of the chest 08/31/2009 showed a new pleural mass consistent with progression of multiple myeloma. He completed 12 cycles of Revlimid/Decadron. The serum free light chains were improved to 4.34 on 12/29/2009. A restaging CT of the chest 02/19/2010 showed resolution of right axillary lymphadenopathy and a pleural-based mass. The serum free lambda light chains are now elevated.  Salvage Revlimid/Decadron initiated 12/08/2014  Cycle 2 01/05/2015  Cycle 3 02/01/2015 2. History of back pain, likely related to the pleural-based mass at the right chest, resolved. 3. Right axillary/subpectoral lymphadenopathy on a CT of the chest 08/31/2009, likely related to multiple myeloma, resolved. 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 He is maintained on every three-month Zometa. 5. Pancytopenia secondary to  Revlimid and multiple myeloma. He has persistent mild thrombocytopenia. 6. Hypertension. 7. Status post hip replacement. 8. History of back surgery. 9. History of trigeminal neuralgia. 10. Status post removal of a lipoma from the right axilla in February 2010. 11. 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 12. Esophageal reflux disease, followed by Dr. Janace Hoard.  13. Esophageal stricture, status post an evaluation by Dr. Henrene Pastor. 14. Right vocal cord lesion, status post a biopsy by Dr. 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 He completed radiation under the direction of Dr. Valere Dross on 03/15/2011. 15. Admission with pneumonia 03/26/2010. 16. Atrial flutter/fibrillation while hospitalized August 2011. He is followed by Dr. Percival Spanish. 17. Elevated prostate specific antigen, status post a biopsy 01/23/2011. He was found to have a Gleason 6 cancer in 10% of the cores. He is followed with an observation approach. He is now followed by Dr. Diona Fanti. 18. History of Mild hypercalcemia  19. Fall with a left wrist fracture June 2015  20. Right chest wall pain secondary to a right second rib plasmacytoma confirmed on a CT 11/28/2014, improved 21. Hyponatremia-improved  Disposition:  Richard [name] tolerated the second cycle of Revlimid/Decadron well. We will follow-up on the serum free light chains from today. He will begin cycle 3 on 02/01/2015.  He has mild thrombocytopenia today. He will contact us for spontaneous bleeding or bruising. He will ask Dr. Ernie Hew to check a CBC when he sees her in the next few weeks.  Richard [name] will return for an office visit and Zometa 02/24/2015.  Betsy Coder, MD  01/24/2015  4:50 PM

## 2015-01-24 NOTE — Telephone Encounter (Signed)
Pt confirmed labs/ov per 02/16 POF, gave pt AVS.... KJ, sent msg to add Zometa

## 2015-01-24 NOTE — Telephone Encounter (Signed)
Gave avs & calendar for March. KJ sent message to schedule treatment.

## 2015-01-24 NOTE — Telephone Encounter (Signed)
Per staff message and POF I have scheduled appts. Advised scheduler of appts. JMW  

## 2015-01-25 ENCOUNTER — Other Ambulatory Visit: Payer: Self-pay | Admitting: *Deleted

## 2015-01-25 ENCOUNTER — Telehealth: Payer: Self-pay | Admitting: *Deleted

## 2015-01-25 DIAGNOSIS — C61 Malignant neoplasm of prostate: Secondary | ICD-10-CM

## 2015-01-25 DIAGNOSIS — C9000 Multiple myeloma not having achieved remission: Secondary | ICD-10-CM

## 2015-01-25 LAB — KAPPA/LAMBDA LIGHT CHAINS
Kappa free light chain: 3.15 mg/dL — ABNORMAL HIGH (ref 0.33–1.94)
Kappa:Lambda Ratio: 0.12 — ABNORMAL LOW (ref 0.26–1.65)
Lambda Free Lght Chn: 26 mg/dL — ABNORMAL HIGH (ref 0.57–2.63)

## 2015-01-25 MED ORDER — LENALIDOMIDE 25 MG PO CAPS: 25.0000 mg | ORAL_CAPSULE | Freq: Every day | ORAL | Status: DC

## 2015-01-25 NOTE — Telephone Encounter (Signed)
-----   Message from Ladell Pier, MD sent at 01/25/2015  1:50 PM EST ----- Please call patient, light chains are better

## 2015-01-25 NOTE — Telephone Encounter (Signed)
Per Dr. Benay Spice; notified pt's daughter Rip Harbour that light chains are better; f/u as scheduled.  Rip Harbour verbalized understanding and reports pt "just had a nose bleed"  Rip Harbour states no trama to pt "just started bleeding but has stopped now; did not bleed for long"  Asking if they should stop the low dose Aspirin for 1-2 days.  Asked if pt has dry heat in house--Melinda reports "his house is very dry"  Recommended a humidifier and to watch for now; if pt has another nose bleed to contact office.  Rip Harbour verbalized understanding of instructions.

## 2015-01-25 NOTE — Telephone Encounter (Signed)
Called and spoke with patient's daughter to let her know refill for patient's Revlimid has been sent to [facility]. Let her know that patient is due for a survey - she is agreeable to let patient know.

## 2015-01-26 LAB — PSA: PSA: 1.46 ng/mL (ref <=4.00)

## 2015-02-21 ENCOUNTER — Telehealth: Payer: Self-pay

## 2015-02-21 NOTE — Telephone Encounter (Signed)
revlimid refill request to Dr Benay Spice

## 2015-02-22 ENCOUNTER — Other Ambulatory Visit: Payer: Self-pay | Admitting: *Deleted

## 2015-02-22 DIAGNOSIS — C9000 Multiple myeloma not having achieved remission: Secondary | ICD-10-CM

## 2015-02-22 MED ORDER — LENALIDOMIDE 25 MG PO CAPS: 25.0000 mg | ORAL_CAPSULE | Freq: Every day | ORAL | Status: DC

## 2015-02-24 ENCOUNTER — Telehealth: Payer: Self-pay | Admitting: Oncology

## 2015-02-24 ENCOUNTER — Ambulatory Visit (HOSPITAL_BASED_OUTPATIENT_CLINIC_OR_DEPARTMENT_OTHER): Payer: Medicare Other | Admitting: Nurse Practitioner

## 2015-02-24 ENCOUNTER — Ambulatory Visit (HOSPITAL_BASED_OUTPATIENT_CLINIC_OR_DEPARTMENT_OTHER): Payer: Medicare Other

## 2015-02-24 ENCOUNTER — Other Ambulatory Visit (HOSPITAL_BASED_OUTPATIENT_CLINIC_OR_DEPARTMENT_OTHER): Payer: Medicare Other

## 2015-02-24 VITALS — BP 145/63 | HR 76 | Temp 98.4°F | Resp 19 | Ht 68.0 in | Wt 180.0 lb

## 2015-02-24 DIAGNOSIS — C9000 Multiple myeloma not having achieved remission: Secondary | ICD-10-CM

## 2015-02-24 DIAGNOSIS — I10 Essential (primary) hypertension: Secondary | ICD-10-CM

## 2015-02-24 DIAGNOSIS — D696 Thrombocytopenia, unspecified: Secondary | ICD-10-CM

## 2015-02-24 LAB — COMPREHENSIVE METABOLIC PANEL (CC13)
ALK PHOS: 57 U/L (ref 40–150)
ALT: 26 U/L (ref 0–55)
AST: 17 U/L (ref 5–34)
Albumin: 3.6 g/dL (ref 3.5–5.0)
Anion Gap: 9 mEq/L (ref 3–11)
BILIRUBIN TOTAL: 0.92 mg/dL (ref 0.20–1.20)
BUN: 18 mg/dL (ref 7.0–26.0)
CALCIUM: 9.2 mg/dL (ref 8.4–10.4)
CO2: 23 mEq/L (ref 22–29)
CREATININE: 1 mg/dL (ref 0.7–1.3)
Chloride: 101 mEq/L (ref 98–109)
EGFR: 73 mL/min/{1.73_m2} — ABNORMAL LOW (ref >90)
Glucose: 110 mg/dl (ref 70–140)
Potassium: 4.1 mEq/L (ref 3.5–5.1)
Sodium: 133 mEq/L — ABNORMAL LOW (ref 136–145)
Total Protein: 6 g/dL — ABNORMAL LOW (ref 6.4–8.3)

## 2015-02-24 LAB — MANUAL DIFFERENTIAL
ALC: 0.6 10*3/uL — AB (ref 0.9–3.3)
ANC (CHCC MAN DIFF): 2.3 10*3/uL (ref 1.5–6.5)
Band Neutrophils: 0 % (ref 0–10)
Basophil: 1 % (ref 0–2)
Blasts: 0 % (ref 0–0)
EOS: 3 % (ref 0–7)
LYMPH: 19 % (ref 14–49)
MONO: 8 % (ref 0–14)
Metamyelocytes: 0 % (ref 0–0)
Myelocytes: 0 % (ref 0–0)
Other Cell: 0 % (ref 0–0)
PLT EST: DECREASED
PROMYELO: 0 % (ref 0–0)
SEG: 69 % (ref 38–77)
Variant Lymph: 0 % (ref 0–0)
nRBC: 0 % (ref 0–0)

## 2015-02-24 LAB — CBC WITH DIFFERENTIAL/PLATELET
HCT: 33.5 % — ABNORMAL LOW (ref 38.4–49.9)
HGB: 11.3 g/dL — ABNORMAL LOW (ref 13.0–17.1)
MCH: 33.1 pg (ref 27.2–33.4)
MCHC: 33.8 g/dL (ref 32.0–36.0)
MCV: 98 fL (ref 79.3–98.0)
PLATELETS: 125 10*3/uL — AB (ref 140–400)
RBC: 3.42 10*6/uL — AB (ref 4.20–5.82)
RDW: 17.6 % — AB (ref 11.0–14.6)
WBC: 3.4 10*3/uL — ABNORMAL LOW (ref 4.0–10.3)

## 2015-02-24 MED ORDER — DEXAMETHASONE 4 MG PO TABS
20.0000 mg | ORAL_TABLET | ORAL | Status: DC
Start: 2015-02-24 — End: 2015-11-17

## 2015-02-24 MED ORDER — ZOLEDRONIC ACID 4 MG/100ML IV SOLN
4.0000 mg | Freq: Once | INTRAVENOUS | Status: AC
  Administered 2015-02-24: 4 mg via INTRAVENOUS
  Filled 2015-02-24: qty 100

## 2015-02-24 NOTE — Telephone Encounter (Signed)
gv and printed appt sched and avs for pt for April and June...sed added tx.

## 2015-02-24 NOTE — Patient Instructions (Signed)

## 2015-02-24 NOTE — Progress Notes (Signed)
[place] OFFICE PROGRESS NOTE   Diagnosis:  Multiple myeloma  INTERVAL HISTORY:   Richard [name] returns as scheduled. He began cycle 3 Revlimid/dexamethasone 02/02/2015. He denies nausea/vomiting. No mouth sores. No diarrhea. He does have constipation and takes Mirlax every other day. He has intermittent hand cramps. No numbness or tingling in his hands or feet. He denies pain. Appetite is stable.  Objective:  Vital signs in last 24 hours:  Blood pressure 145/63, pulse 76, temperature 98.4 F (36.9 C), temperature source Oral, resp. rate 19, height 5' 8"  (1.727 m), weight 180 lb (81.647 kg), SpO2 100 %.    HEENT: No thrush or ulcers. Lymphatics: No palpable cervical or supraclavicular lymph nodes. Resp: Lungs clear bilaterally. Cardio: Regular rate and rhythm. GI: Abdomen soft and nontender. No hepatomegaly. Vascular: No leg edema. Calves soft and nontender.   Lab Results:  Lab Results  Component Value Date   WBC 3.4* 02/24/2015   HGB 11.3* 02/24/2015   HCT 33.5* 02/24/2015   MCV 98.0 02/24/2015   PLT 125* 02/24/2015   NEUTROABS 3.3 01/24/2015    Imaging:  No results found.  Medications: I have reviewed the patient's current medications.  Assessment/Plan: 1. Multiple myeloma: The serum free light chains were elevated 08/29/2009. A CT of the chest 08/31/2009 showed a new pleural mass consistent with progression of multiple myeloma. He completed 12 cycles of Revlimid/Decadron. The serum free light chains were improved to 4.34 on 12/29/2009. A restaging CT of the chest 02/19/2010 showed resolution of right axillary lymphadenopathy and a pleural-based mass. The serum free lambda light chains are now elevated.  Salvage Revlimid/Decadron initiated 12/08/2014  Cycle 2 01/05/2015  Serum light chains improved 01/24/2015  Cycle 3 02/01/2015  Cycle 4 03/02/2015 2. History of back pain, likely related to the pleural-based mass at the right chest,  resolved. 3. Right axillary/subpectoral lymphadenopathy on a CT of the chest 08/31/2009, likely related to multiple myeloma, resolved. 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 He is maintained on every three-month Zometa. 5. Pancytopenia secondary to Revlimid and multiple myeloma. He has persistent mild thrombocytopenia. 6. Hypertension. 7. Status post hip replacement. 8. History of back surgery. 9. History of trigeminal neuralgia. 10. Status post removal of a lipoma from the right axilla in February 2010. 11. 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 12. Esophageal reflux disease, followed by Dr. Janace [name].  13. Esophageal stricture, status post an evaluation by Dr. Henrene [name]. 14. Right vocal cord lesion, status post a biopsy by Dr. 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 He completed radiation under the direction of Dr. Valere [name] on 03/15/2011. 15. Admission with pneumonia 03/26/2010. 16. Atrial flutter/fibrillation while hospitalized August 2011. He is followed by Dr. Percival [name]. 17. Elevated prostate specific antigen, status post a biopsy 01/23/2011. He was found to have a Gleason 6 cancer in 10% of the cores. He is followed with an observation approach. He is now followed by Dr. Diona [name]. 18. History of Mild hypercalcemia  19. Fall with a left wrist fracture June 2015  20. Right chest wall pain secondary to a right second rib plasmacytoma confirmed on a CT 11/28/2014, improved 21. Hyponatremia-improved   Disposition: Richard [name] appears stable. The serum light chains were improved following 2 cycles of Revlimid/dexamethasone. He has now completed 3 cycles. We will follow-up on the serum light chains from today. He will begin cycle 4 Revlimid/dexamethasone as scheduled beginning 03/02/2015. He continues Zometa every 3 months and will receive an infusion today.  He will return for a follow-up visit 03/28/2015. He will contact  the office in the interim with any problems.  Plan reviewed with Dr. Benay [name].    Richard [name] ANP/GNP-BC   02/24/2015  11:41 AM

## 2015-02-27 LAB — KAPPA/LAMBDA LIGHT CHAINS
Kappa free light chain: 2.86 mg/dL — ABNORMAL HIGH (ref 0.33–1.94)
Kappa:Lambda Ratio: 0.2 — ABNORMAL LOW (ref 0.26–1.65)
Lambda Free Lght Chn: 14.3 mg/dL — ABNORMAL HIGH (ref 0.57–2.63)

## 2015-02-27 NOTE — Telephone Encounter (Signed)
RECEIVED A FAX FROM [facility] CONCERNING A CONFIRMATION OF PRESCRIPTION SHIPMENT FOR REVLIMID ON 02/24/15.

## 2015-03-08 ENCOUNTER — Encounter: Payer: Self-pay | Admitting: Oncology

## 2015-03-08 NOTE — Progress Notes (Signed)
I faxed form for Revlimid to [facility].

## 2015-03-20 ENCOUNTER — Telehealth: Payer: Self-pay | Admitting: *Deleted

## 2015-03-20 NOTE — Telephone Encounter (Signed)
[facility] faxed Revlimid refill request.  Request to provider's desk/in-basket for review. 

## 2015-03-22 ENCOUNTER — Other Ambulatory Visit: Payer: Self-pay | Admitting: *Deleted

## 2015-03-23 ENCOUNTER — Other Ambulatory Visit: Payer: Self-pay | Admitting: *Deleted

## 2015-03-23 DIAGNOSIS — C9000 Multiple myeloma not having achieved remission: Secondary | ICD-10-CM

## 2015-03-23 MED ORDER — LENALIDOMIDE 25 MG PO CAPS: 25.0000 mg | ORAL_CAPSULE | Freq: Every day | ORAL | Status: DC

## 2015-03-23 NOTE — Telephone Encounter (Signed)
Left message with pt's daughter Rip Harbour to remind pt or daughter to take [place] @ [phone]; call office if questions.

## 2015-03-28 ENCOUNTER — Other Ambulatory Visit: Payer: Medicare Other

## 2015-03-28 ENCOUNTER — Other Ambulatory Visit (HOSPITAL_BASED_OUTPATIENT_CLINIC_OR_DEPARTMENT_OTHER): Payer: Medicare Other

## 2015-03-28 ENCOUNTER — Ambulatory Visit: Payer: Medicare Other | Admitting: Oncology

## 2015-03-28 DIAGNOSIS — C9000 Multiple myeloma not having achieved remission: Secondary | ICD-10-CM | POA: Diagnosis not present

## 2015-03-28 LAB — COMPREHENSIVE METABOLIC PANEL (CC13)
ALT: 20 U/L (ref 0–55)
ANION GAP: 10 meq/L (ref 3–11)
AST: 17 U/L (ref 5–34)
Albumin: 3.4 g/dL — ABNORMAL LOW (ref 3.5–5.0)
Alkaline Phosphatase: 58 U/L (ref 40–150)
BUN: 12.9 mg/dL (ref 7.0–26.0)
CHLORIDE: 96 meq/L — AB (ref 98–109)
CO2: 24 mEq/L (ref 22–29)
Calcium: 8.9 mg/dL (ref 8.4–10.4)
Creatinine: 0.9 mg/dL (ref 0.7–1.3)
EGFR: 82 mL/min/{1.73_m2} — ABNORMAL LOW (ref >90)
Glucose: 108 mg/dl (ref 70–140)
Potassium: 4 mEq/L (ref 3.5–5.1)
SODIUM: 130 meq/L — AB (ref 136–145)
Total Bilirubin: 0.81 mg/dL (ref 0.20–1.20)
Total Protein: 5.9 g/dL — ABNORMAL LOW (ref 6.4–8.3)

## 2015-03-28 LAB — CBC WITH DIFFERENTIAL/PLATELET
BASO%: 0.6 % (ref 0.0–2.0)
BASOS ABS: 0 10*3/uL (ref 0.0–0.1)
EOS ABS: 0.1 10*3/uL (ref 0.0–0.5)
EOS%: 1.8 % (ref 0.0–7.0)
HEMATOCRIT: 30.9 % — AB (ref 38.4–49.9)
HGB: 10.5 g/dL — ABNORMAL LOW (ref 13.0–17.1)
LYMPH%: 15.7 % (ref 14.0–49.0)
MCH: 31.8 pg (ref 27.2–33.4)
MCHC: 34.1 g/dL (ref 32.0–36.0)
MCV: 93.4 fL (ref 79.3–98.0)
MONO#: 0.6 10*3/uL (ref 0.1–0.9)
MONO%: 11.7 % (ref 0.0–14.0)
NEUT%: 70.2 % (ref 39.0–75.0)
NEUTROS ABS: 3.5 10*3/uL (ref 1.5–6.5)
PLATELETS: 123 10*3/uL — AB (ref 140–400)
RBC: 3.3 10*6/uL — ABNORMAL LOW (ref 4.20–5.82)
RDW: 16.9 % — ABNORMAL HIGH (ref 11.0–14.6)
WBC: 5 10*3/uL (ref 4.0–10.3)
lymph#: 0.8 10*3/uL — ABNORMAL LOW (ref 0.9–3.3)

## 2015-03-29 ENCOUNTER — Other Ambulatory Visit: Payer: Medicare Other

## 2015-03-29 ENCOUNTER — Telehealth: Payer: Self-pay | Admitting: Oncology

## 2015-03-29 ENCOUNTER — Ambulatory Visit (HOSPITAL_BASED_OUTPATIENT_CLINIC_OR_DEPARTMENT_OTHER): Payer: Medicare Other | Admitting: Oncology

## 2015-03-29 VITALS — BP 113/53 | HR 83 | Temp 98.1°F | Resp 17 | Ht 68.0 in | Wt 180.0 lb

## 2015-03-29 DIAGNOSIS — D801 Nonfamilial hypogammaglobulinemia: Secondary | ICD-10-CM | POA: Diagnosis not present

## 2015-03-29 DIAGNOSIS — C9000 Multiple myeloma not having achieved remission: Secondary | ICD-10-CM | POA: Diagnosis not present

## 2015-03-29 DIAGNOSIS — I10 Essential (primary) hypertension: Secondary | ICD-10-CM | POA: Diagnosis not present

## 2015-03-29 DIAGNOSIS — D696 Thrombocytopenia, unspecified: Secondary | ICD-10-CM

## 2015-03-29 LAB — KAPPA/LAMBDA LIGHT CHAINS
KAPPA FREE LGHT CHN: 2.2 mg/dL — AB (ref 0.33–1.94)
Kappa:Lambda Ratio: 0.13 — ABNORMAL LOW (ref 0.26–1.65)
LAMBDA FREE LGHT CHN: 17.1 mg/dL — AB (ref 0.57–2.63)

## 2015-03-29 NOTE — Telephone Encounter (Signed)
Pt confirmed labs/ov per 04/20 POF, gave pt AVS and Calendar.... KJ  °

## 2015-03-29 NOTE — Progress Notes (Signed)
Richard [name]   Diagnosis: Multiple myeloma  INTERVAL HISTORY:   Richard [name] returns as scheduled. He began another cycle of Revlimid/Decadron 03/02/2015. He reports stiffness in the hands. He relates this to Revlimid. He has insomnia when taking Decadron. He completes of a left frontal headache today. No associated symptoms. The chest pain has resolved.  Objective:  Vital signs in last 24 hours:  Blood pressure 113/53, pulse 83, temperature 98.1 F (36.7 C), temperature source Oral, resp. rate 17, height 5' 8"  (9.622 m), weight 180 lb (81.647 kg), SpO2 97 %.    HEENT: No thrush or ulcers, no frontal or sinus tenderness Resp: Lungs clear bilaterally Cardio: Regular rate and rhythm GI: No hepatosplenomegaly, nontender Vascular: No leg edema   Lab Results:  Lab Results  Component Value Date   WBC 5.0 03/28/2015   HGB 10.5* 03/28/2015   HCT 30.9* 03/28/2015   MCV 93.4 03/28/2015   PLT 123* 03/28/2015   NEUTROABS 3.5 03/28/2015    Lab Results  Component Value Date   NA 130* 03/28/2015   Serum free lambda light chains on 02/24/2015: 14.3  Medications: I have reviewed the patient's current medications.  Assessment/Plan: 1. Multiple myeloma: The serum free light chains were elevated 08/29/2009. A CT of the chest 08/31/2009 showed a new pleural mass consistent with progression of multiple myeloma. He completed 12 cycles of Revlimid/Decadron. The serum free light chains were improved to 4.34 on 12/29/2009. A restaging CT of the chest 02/19/2010 showed resolution of right axillary lymphadenopathy and a pleural-based mass. The serum free lambda light chains are now elevated.  Salvage Revlimid/Decadron initiated 12/08/2014  Cycle 2 01/05/2015  Serum light chains improved 01/24/2015  Cycle 3 02/01/2015  Cycle 4 03/02/2015  Cycle 5 03/30/2015 2.History of back pain, likely related to the pleural-based mass at the right chest,  resolved. 2. Right axillary/subpectoral lymphadenopathy on a CT of the chest 08/31/2009, likely related to multiple myeloma, resolved. 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 He is maintained on every three-month Zometa. 4. Pancytopenia secondary to Revlimid and multiple myeloma. He has persistent mild thrombocytopenia. 5. Hypertension. 6. Status post hip replacement. 7. History of back surgery. 8. History of trigeminal neuralgia. 9. Status post removal of a lipoma from the right axilla in February 2010. 10. 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 11. Esophageal reflux disease, followed by Dr. Janace Hoard.  12. Esophageal stricture, status post an evaluation by Dr. Henrene Pastor. 13. Right vocal cord lesion, status post a biopsy by Dr. 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 He completed radiation under the direction of Dr. Valere Dross on 03/15/2011. 14. Admission with pneumonia 03/26/2010. 15. Atrial flutter/fibrillation while hospitalized August 2011. He is followed by Dr. Percival Spanish. 16. Elevated prostate specific antigen, status post a biopsy 01/23/2011. He was found to have a Gleason 6 cancer in 10% of the cores. He is followed with an observation approach. He is now followed by Dr. Diona Fanti. 17. History of Mild hypercalcemia  18. Fall with a left wrist fracture June 2015  19. Right chest wall pain secondary to a right second rib plasmacytoma confirmed on a CT 11/28/2014, improved 20. Hyponatremia-improved    Disposition:  Richard [name] will begin cycle 5 Revlimid/Decadron on 03/30/2015. He appears to be tolerating the treatment well and the serum free light chains are improved. He will return for an office visit in one month. He will let us know if the headache does not resolve.  Betsy Coder, MD  03/29/2015  9:35 AM

## 2015-03-30 ENCOUNTER — Telehealth: Payer: Self-pay | Admitting: *Deleted

## 2015-03-30 NOTE — Telephone Encounter (Signed)
Left message at 1252 informing pt of stable lab results. Message from pt's daughter requesting detailed message with pt's light chain results. Left message on voicemail with full results.

## 2015-03-30 NOTE — Telephone Encounter (Signed)
-----   Message from Ladell Pier, MD sent at 03/29/2015  8:52 PM EDT ----- Please call patient, lambda light chains are stable

## 2015-03-31 ENCOUNTER — Other Ambulatory Visit: Payer: Self-pay | Admitting: Oncology

## 2015-04-21 ENCOUNTER — Telehealth: Payer: Self-pay

## 2015-04-21 NOTE — Telephone Encounter (Signed)
Refill request for revlimid to Dr Benay Spice

## 2015-04-24 ENCOUNTER — Telehealth: Payer: Self-pay | Admitting: Oncology

## 2015-04-24 ENCOUNTER — Telehealth: Payer: Self-pay | Admitting: *Deleted

## 2015-04-24 ENCOUNTER — Other Ambulatory Visit (HOSPITAL_BASED_OUTPATIENT_CLINIC_OR_DEPARTMENT_OTHER): Payer: Medicare Other

## 2015-04-24 ENCOUNTER — Other Ambulatory Visit: Payer: Self-pay | Admitting: *Deleted

## 2015-04-24 ENCOUNTER — Ambulatory Visit (HOSPITAL_BASED_OUTPATIENT_CLINIC_OR_DEPARTMENT_OTHER): Payer: Medicare Other | Admitting: Nurse Practitioner

## 2015-04-24 VITALS — BP 151/70 | HR 68 | Temp 98.1°F | Resp 18 | Ht 68.0 in | Wt 176.3 lb

## 2015-04-24 DIAGNOSIS — D696 Thrombocytopenia, unspecified: Secondary | ICD-10-CM

## 2015-04-24 DIAGNOSIS — I4891 Unspecified atrial fibrillation: Secondary | ICD-10-CM | POA: Diagnosis not present

## 2015-04-24 DIAGNOSIS — C9000 Multiple myeloma not having achieved remission: Secondary | ICD-10-CM

## 2015-04-24 DIAGNOSIS — D801 Nonfamilial hypogammaglobulinemia: Secondary | ICD-10-CM | POA: Diagnosis not present

## 2015-04-24 DIAGNOSIS — I10 Essential (primary) hypertension: Secondary | ICD-10-CM

## 2015-04-24 LAB — CBC WITH DIFFERENTIAL/PLATELET
BASO%: 0.8 % (ref 0.0–2.0)
Basophils Absolute: 0 10*3/uL (ref 0.0–0.1)
EOS ABS: 0.1 10*3/uL (ref 0.0–0.5)
EOS%: 1.8 % (ref 0.0–7.0)
HEMATOCRIT: 33.6 % — AB (ref 38.4–49.9)
HEMOGLOBIN: 11.8 g/dL — AB (ref 13.0–17.1)
LYMPH%: 22.3 % (ref 14.0–49.0)
MCH: 32.9 pg (ref 27.2–33.4)
MCHC: 35 g/dL (ref 32.0–36.0)
MCV: 94 fL (ref 79.3–98.0)
MONO#: 0.7 10*3/uL (ref 0.1–0.9)
MONO%: 12.8 % (ref 0.0–14.0)
NEUT%: 62.3 % (ref 39.0–75.0)
NEUTROS ABS: 3.5 10*3/uL (ref 1.5–6.5)
Platelets: 120 10*3/uL — ABNORMAL LOW (ref 140–400)
RBC: 3.57 10*6/uL — ABNORMAL LOW (ref 4.20–5.82)
RDW: 17.7 % — ABNORMAL HIGH (ref 11.0–14.6)
WBC: 5.6 10*3/uL (ref 4.0–10.3)
lymph#: 1.3 10*3/uL (ref 0.9–3.3)

## 2015-04-24 LAB — COMPREHENSIVE METABOLIC PANEL (CC13)
ALBUMIN: 3.7 g/dL (ref 3.5–5.0)
ALT: 25 U/L (ref 0–55)
AST: 21 U/L (ref 5–34)
Alkaline Phosphatase: 60 U/L (ref 40–150)
Anion Gap: 10 mEq/L (ref 3–11)
BUN: 16.9 mg/dL (ref 7.0–26.0)
CALCIUM: 9.5 mg/dL (ref 8.4–10.4)
CHLORIDE: 99 meq/L (ref 98–109)
CO2: 24 meq/L (ref 22–29)
CREATININE: 0.9 mg/dL (ref 0.7–1.3)
EGFR: 76 mL/min/{1.73_m2} — AB (ref >90)
Glucose: 106 mg/dl (ref 70–140)
Potassium: 3.9 mEq/L (ref 3.5–5.1)
Sodium: 133 mEq/L — ABNORMAL LOW (ref 136–145)
TOTAL PROTEIN: 5.9 g/dL — AB (ref 6.4–8.3)
Total Bilirubin: 1.28 mg/dL — ABNORMAL HIGH (ref 0.20–1.20)

## 2015-04-24 MED ORDER — LENALIDOMIDE 25 MG PO CAPS: 25.0000 mg | ORAL_CAPSULE | Freq: Every day | ORAL | Status: DC

## 2015-04-24 NOTE — Telephone Encounter (Signed)
Pt confirmed labs/ov per 05/16 POF, gave pt AVS and Calendar.... KJ, sent msg to move chemo from 06/10 to 06/13.Marland KitchenMarland KitchenMarland Kitchen

## 2015-04-24 NOTE — Telephone Encounter (Signed)
Per staff message and POF I have scheduled appts. Advised scheduler of appts. JMW  

## 2015-04-24 NOTE — Progress Notes (Signed)
[place] OFFICE PROGRESS NOTE   Diagnosis:  Multiple myeloma  INTERVAL HISTORY:   Mr. Maish returns as scheduled. He began cycle 5 Revlimid/Decadron on 03/30/2015. He denies nausea/vomiting. No mouth sores. No diarrhea. He has intermittent constipation. He has stable numbness in his hands. He denies pain. He has lost some weight since his last visit. He attributes the weight loss to decreasing calories because he is concerned about truncal obesity.  Objective:  Vital signs in last 24 hours:  Blood pressure 151/70, pulse 68, temperature 98.1 F (36.7 C), temperature source Oral, resp. rate 18, height 5' 8"  (1.727 m), weight 176 lb 4.8 oz (79.969 kg), SpO2 100 %.    HEENT: No thrush or ulcers. Resp: Lungs clear bilaterally. Cardio: Regular rate and rhythm. GI: Abdomen soft and nontender. No organomegaly. Vascular: No leg edema. Neuro: Vibratory sense mildly decreased over the fingertips per tuning fork exam.  Skin: No rash.    Lab Results:  Lab Results  Component Value Date   WBC 5.6 04/24/2015   HGB 11.8* 04/24/2015   HCT 33.6* 04/24/2015   MCV 94.0 04/24/2015   PLT 120* 04/24/2015   NEUTROABS 3.5 04/24/2015    Imaging:  No results found.  Medications: I have reviewed the patient's current medications.  Assessment/Plan: 1. Multiple myeloma: The serum free light chains were elevated 08/29/2009. A CT of the chest 08/31/2009 showed a new pleural mass consistent with progression of multiple myeloma. He completed 12 cycles of Revlimid/Decadron. The serum free light chains were improved to 4.34 on 12/29/2009. A restaging CT of the chest 02/19/2010 showed resolution of right axillary lymphadenopathy and a pleural-based mass. The serum free lambda light chains are now elevated.  Salvage Revlimid/Decadron initiated 12/08/2014  Cycle 2 01/05/2015  Serum light chains improved 01/24/2015  Cycle 3 02/01/2015  Cycle 4 03/02/2015  Cycle 5  03/30/2015  Cycle 6 04/27/2015 2.History of back pain, likely related to the pleural-based mass at the right chest, resolved. 2. Right axillary/subpectoral lymphadenopathy on a CT of the chest 08/31/2009, likely related to multiple myeloma, resolved. 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 He is maintained on every three-month Zometa. 4. Pancytopenia secondary to Revlimid and multiple myeloma. He has persistent mild thrombocytopenia. 5. Hypertension. 6. Status post hip replacement. 7. History of back surgery. 8. History of trigeminal neuralgia. 9. Status post removal of a lipoma from the right axilla in February 2010. 10. 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 11. Esophageal reflux disease, followed by Dr. Janace Hoard.  12. Esophageal stricture, status post an evaluation by Dr. Henrene Pastor. 13. Right vocal cord lesion, status post a biopsy by Dr. 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 He completed radiation under the direction of Dr. Valere Dross on 03/15/2011. 14. Admission with pneumonia 03/26/2010. 15. Atrial flutter/fibrillation while hospitalized August 2011. He is followed by Dr. Percival Spanish. 16. Elevated prostate specific antigen, status post a biopsy 01/23/2011. He was found to have a Gleason 6 cancer in 10% of the cores. He is followed with an observation approach. He is now followed by Dr. Diona Fanti. 17. History of Mild hypercalcemia  18. Fall with a left wrist fracture June 2015  19. Right chest wall pain secondary to a right second rib plasmacytoma confirmed on a CT 11/28/2014, improved 20. Hyponatremia-improved     Disposition: Mr. Louvier appears stable. He has completed 5 cycles of Revlimid/dexamethasone. Plan to proceed with cycle 6 beginning 04/27/2015. We will follow-up on the serum light chains from  today. He will return for a follow-up visit and Zometa in one month.  We discussed the redistribution of body  fat that occurs with chronic steroid use.  Plan reviewed with Dr. Benay Spice.    Kacee Card ANP/GNP-BC   04/24/2015  12:17 PM

## 2015-04-25 ENCOUNTER — Encounter: Payer: Self-pay | Admitting: *Deleted

## 2015-04-25 LAB — KAPPA/LAMBDA LIGHT CHAINS
KAPPA FREE LGHT CHN: 1.71 mg/dL (ref 0.33–1.94)
Kappa:Lambda Ratio: 0.14 — ABNORMAL LOW (ref 0.26–1.65)
LAMBDA FREE LGHT CHN: 12.3 mg/dL — AB (ref 0.57–2.63)

## 2015-04-25 NOTE — Progress Notes (Signed)
RECEIVED A FAX FROM [facility] CONCERNING A CONFIRMATION OF PRESCRIPTION SHIPMENT FOR REVLIMID ON 04/24/15.

## 2015-04-26 ENCOUNTER — Telehealth: Payer: Self-pay | Admitting: *Deleted

## 2015-04-26 NOTE — Telephone Encounter (Signed)
Left message on voicemail informing pt light chains are better. Continue current treatment.

## 2015-04-26 NOTE — Telephone Encounter (Signed)
-----   Message from Richard Pier, MD sent at 04/25/2015  7:18 PM EDT ----- Please call patient, light chains are better, continue current treatment

## 2015-05-17 ENCOUNTER — Other Ambulatory Visit: Payer: Self-pay | Admitting: *Deleted

## 2015-05-17 DIAGNOSIS — C9000 Multiple myeloma not having achieved remission: Secondary | ICD-10-CM

## 2015-05-17 MED ORDER — LENALIDOMIDE 25 MG PO CAPS: 25.0000 mg | ORAL_CAPSULE | Freq: Every day | ORAL | Status: DC

## 2015-05-19 ENCOUNTER — Ambulatory Visit: Payer: Medicare Other

## 2015-05-22 ENCOUNTER — Other Ambulatory Visit (HOSPITAL_BASED_OUTPATIENT_CLINIC_OR_DEPARTMENT_OTHER): Payer: Medicare Other

## 2015-05-22 ENCOUNTER — Ambulatory Visit (HOSPITAL_BASED_OUTPATIENT_CLINIC_OR_DEPARTMENT_OTHER): Payer: Medicare Other | Admitting: Oncology

## 2015-05-22 ENCOUNTER — Ambulatory Visit (HOSPITAL_BASED_OUTPATIENT_CLINIC_OR_DEPARTMENT_OTHER): Payer: Medicare Other

## 2015-05-22 ENCOUNTER — Telehealth: Payer: Self-pay | Admitting: Oncology

## 2015-05-22 VITALS — BP 129/68 | HR 84 | Temp 98.0°F | Resp 20 | Ht 68.0 in | Wt 176.7 lb

## 2015-05-22 DIAGNOSIS — C9000 Multiple myeloma not having achieved remission: Secondary | ICD-10-CM

## 2015-05-22 DIAGNOSIS — I10 Essential (primary) hypertension: Secondary | ICD-10-CM

## 2015-05-22 DIAGNOSIS — D696 Thrombocytopenia, unspecified: Secondary | ICD-10-CM

## 2015-05-22 DIAGNOSIS — I4891 Unspecified atrial fibrillation: Secondary | ICD-10-CM | POA: Diagnosis not present

## 2015-05-22 DIAGNOSIS — D801 Nonfamilial hypogammaglobulinemia: Secondary | ICD-10-CM

## 2015-05-22 DIAGNOSIS — E871 Hypo-osmolality and hyponatremia: Secondary | ICD-10-CM

## 2015-05-22 LAB — CBC WITH DIFFERENTIAL/PLATELET
BASO%: 0.2 % (ref 0.0–2.0)
BASOS ABS: 0 10*3/uL (ref 0.0–0.1)
EOS ABS: 0.1 10*3/uL (ref 0.0–0.5)
EOS%: 1.8 % (ref 0.0–7.0)
HEMATOCRIT: 32.9 % — AB (ref 38.4–49.9)
HEMOGLOBIN: 11.6 g/dL — AB (ref 13.0–17.1)
LYMPH#: 1 10*3/uL (ref 0.9–3.3)
LYMPH%: 21.3 % (ref 14.0–49.0)
MCH: 33 pg (ref 27.2–33.4)
MCHC: 35.3 g/dL (ref 32.0–36.0)
MCV: 93.5 fL (ref 79.3–98.0)
MONO#: 0.8 10*3/uL (ref 0.1–0.9)
MONO%: 17 % — ABNORMAL HIGH (ref 0.0–14.0)
NEUT#: 2.9 10*3/uL (ref 1.5–6.5)
NEUT%: 59.7 % (ref 39.0–75.0)
Platelets: 99 10*3/uL — ABNORMAL LOW (ref 140–400)
RBC: 3.52 10*6/uL — ABNORMAL LOW (ref 4.20–5.82)
RDW: 16.4 % — AB (ref 11.0–14.6)
WBC: 4.9 10*3/uL (ref 4.0–10.3)

## 2015-05-22 LAB — COMPREHENSIVE METABOLIC PANEL (CC13)
ALT: 26 U/L (ref 0–55)
AST: 19 U/L (ref 5–34)
Albumin: 3.5 g/dL (ref 3.5–5.0)
Alkaline Phosphatase: 54 U/L (ref 40–150)
Anion Gap: 9 mEq/L (ref 3–11)
BILIRUBIN TOTAL: 1.22 mg/dL — AB (ref 0.20–1.20)
BUN: 15.5 mg/dL (ref 7.0–26.0)
CO2: 24 meq/L (ref 22–29)
CREATININE: 1 mg/dL (ref 0.7–1.3)
Calcium: 9.4 mg/dL (ref 8.4–10.4)
Chloride: 99 mEq/L (ref 98–109)
EGFR: 73 mL/min/{1.73_m2} — AB (ref >90)
Glucose: 111 mg/dl (ref 70–140)
Potassium: 3.5 mEq/L (ref 3.5–5.1)
Sodium: 132 mEq/L — ABNORMAL LOW (ref 136–145)
Total Protein: 5.8 g/dL — ABNORMAL LOW (ref 6.4–8.3)

## 2015-05-22 MED ORDER — SODIUM CHLORIDE 0.9 % IV SOLN
Freq: Once | INTRAVENOUS | Status: AC
  Administered 2015-05-22: 12:00:00 via INTRAVENOUS

## 2015-05-22 MED ORDER — ZOLEDRONIC ACID 4 MG/100ML IV SOLN
4.0000 mg | Freq: Once | INTRAVENOUS | Status: AC
  Administered 2015-05-22: 4 mg via INTRAVENOUS
  Filled 2015-05-22: qty 100

## 2015-05-22 NOTE — Progress Notes (Signed)
  [place] OFFICE PROGRESS NOTE   Diagnosis: Multiple myeloma  INTERVAL HISTORY:   Mr. Bottomley returns as scheduled. He continues Revlimid/Decadron. No nausea or diarrhea. No pain. Mild neuropathy symptoms in the hands. This is unchanged and does not interfere with activity.  Objective:  Vital signs in last 24 hours:  Blood pressure 129/68, pulse 84, temperature 98 F (36.7 C), temperature source Oral, resp. rate 20, height $RemoveBe'5\' 8"'OaQmnoHro$  (1.727 m), weight 176 lb 11.2 oz (80.151 kg), SpO2 99 %.    HEENT: No thrush or ulcers Resp: Lungs clear bilaterally Cardio: Regular rate and rhythm GI: No hepatosplenomegaly, nontender Vascular: No leg edema Neuro: Mild decrease in vibratory sense at the fingertips bilaterally      Lab Results:  Lab Results  Component Value Date   WBC 4.9 05/22/2015   HGB 11.6* 05/22/2015   HCT 32.9* 05/22/2015   MCV 93.5 05/22/2015   PLT 99* 05/22/2015   NEUTROABS 2.9 05/22/2015   04/24/2015: Lambda free light chains 12.3  Medications: I have reviewed the patient's current medications.  Assessment/Plan: 1. Multiple myeloma: The serum free light chains were elevated 08/29/2009. A CT of the chest 08/31/2009 showed a new pleural mass consistent with progression of multiple myeloma. He completed 12 cycles of Revlimid/Decadron. The serum free light chains were improved to 4.34 on 12/29/2009. A restaging CT of the chest 02/19/2010 showed resolution of right axillary lymphadenopathy and a pleural-based mass. The serum free lambda light chains are now elevated.  Salvage Revlimid/Decadron initiated 12/08/2014  Cycle 2 01/05/2015  Serum light chains improved 01/24/2015  Cycle 3 02/01/2015  Cycle 4 03/02/2015  Cycle 5 03/30/2015  Cycle 6 04/27/2015  Cycle 7 05/25/2015 2.History of back pain, likely related to the pleural-based mass at the right chest, resolved. 2. Right axillary/subpectoral lymphadenopathy on a CT of the chest 08/31/2009,  likely related to multiple myeloma, resolved. 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 He is maintained on every three-month Zometa. 4. Pancytopenia secondary to Revlimid and multiple myeloma. He has persistent mild thrombocytopenia. 5. Hypertension. 6. Status post hip replacement. 7. History of back surgery. 8. History of trigeminal neuralgia. 9. Status post removal of a lipoma from the right axilla in February 2010. 10. 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 11. Esophageal reflux disease, followed by Dr. Janace Hoard.  12. Esophageal stricture, status post an evaluation by Dr. Henrene Pastor. 13. Right vocal cord lesion, status post a biopsy by Dr. 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 He completed radiation under the direction of Dr. Valere Dross on 03/15/2011. 14. Admission with pneumonia 03/26/2010. 15. Atrial flutter/fibrillation while hospitalized August 2011. He is followed by Dr. Percival Spanish. 16. Elevated prostate specific antigen, status post a biopsy 01/23/2011. He was found to have a Gleason 6 cancer in 10% of the cores. He is followed with an observation approach. He is now followed by Dr. Diona Fanti. 17. History of Mild hypercalcemia  18. Fall with a left wrist fracture June 2015  19. Right chest wall pain secondary to a right second rib plasmacytoma confirmed on a CT 11/28/2014, resolved 20. Hyponatremia-improved   Disposition:  Mr. Radloff appears stable. He will begin another cycle of Revlimid/Decadron on 05/25/2015. He will receive Zometa today. Mr. Ingalls will return for an office and lab visit in one month.  Betsy Coder, MD  05/22/2015  10:37 AM

## 2015-05-22 NOTE — Patient Instructions (Signed)

## 2015-05-22 NOTE — Addendum Note (Signed)
Addended by: Brien Few on: 05/22/2015 05:04 PM   Modules accepted: Orders, Medications

## 2015-05-22 NOTE — Telephone Encounter (Signed)
per pof to sch pt appt-gave pt copy of avs °

## 2015-05-23 ENCOUNTER — Telehealth: Payer: Self-pay

## 2015-05-23 LAB — KAPPA/LAMBDA LIGHT CHAINS
KAPPA FREE LGHT CHN: 2.1 mg/dL — AB (ref 0.33–1.94)
KAPPA LAMBDA RATIO: 0.2 — AB (ref 0.26–1.65)
LAMBDA FREE LGHT CHN: 10.7 mg/dL — AB (ref 0.57–2.63)

## 2015-05-23 NOTE — Telephone Encounter (Signed)
Confirmation of shipment of revlimid w/ est delivery date 05/22/15

## 2015-05-24 ENCOUNTER — Telehealth: Payer: Self-pay | Admitting: *Deleted

## 2015-05-24 NOTE — Telephone Encounter (Signed)
-----   Message from Ladell Pier, MD sent at 05/23/2015  7:17 PM EDT ----- Please call patient , light chains are better, continue current therapy

## 2015-05-24 NOTE — Telephone Encounter (Signed)
Left message on VM to discuss labs and to inform pt to continue current tx per Dr. Benay Spice. No answer. LVM for callback

## 2015-05-31 ENCOUNTER — Telehealth: Payer: Self-pay | Admitting: *Deleted

## 2015-05-31 NOTE — Telephone Encounter (Signed)
TC from pt's daughter stating her father has a new PCP - Dr. Rachell Cipro and has requested his medical records from Dr. Benay Spice be fax'd to the new PCP. Pt and his daughter are at PCP office now and no records have been sent/fax'd. Informed daughter that she or her father need to have release of information signed here at this office before records are fax'd. She stated she did not know this. She is coming over this afternoon to sign ROI.

## 2015-06-15 ENCOUNTER — Other Ambulatory Visit (HOSPITAL_BASED_OUTPATIENT_CLINIC_OR_DEPARTMENT_OTHER): Payer: Medicare Other

## 2015-06-15 DIAGNOSIS — C9000 Multiple myeloma not having achieved remission: Secondary | ICD-10-CM | POA: Diagnosis not present

## 2015-06-15 LAB — CBC WITH DIFFERENTIAL/PLATELET
BASO%: 0.2 % (ref 0.0–2.0)
Basophils Absolute: 0 10*3/uL (ref 0.0–0.1)
EOS%: 6.7 % (ref 0.0–7.0)
Eosinophils Absolute: 0.2 10*3/uL (ref 0.0–0.5)
HCT: 33.3 % — ABNORMAL LOW (ref 38.4–49.9)
HEMOGLOBIN: 11.5 g/dL — AB (ref 13.0–17.1)
LYMPH#: 0.6 10*3/uL — AB (ref 0.9–3.3)
LYMPH%: 16.3 % (ref 14.0–49.0)
MCH: 32.6 pg (ref 27.2–33.4)
MCHC: 34.5 g/dL (ref 32.0–36.0)
MCV: 94.4 fL (ref 79.3–98.0)
MONO#: 0.5 10*3/uL (ref 0.1–0.9)
MONO%: 14 % (ref 0.0–14.0)
NEUT%: 62.8 % (ref 39.0–75.0)
NEUTROS ABS: 2.3 10*3/uL (ref 1.5–6.5)
Platelets: 98 10*3/uL — ABNORMAL LOW (ref 140–400)
RBC: 3.52 10*6/uL — AB (ref 4.20–5.82)
RDW: 17.3 % — ABNORMAL HIGH (ref 11.0–14.6)
WBC: 3.6 10*3/uL — ABNORMAL LOW (ref 4.0–10.3)

## 2015-06-15 LAB — COMPREHENSIVE METABOLIC PANEL (CC13)
ALK PHOS: 65 U/L (ref 40–150)
ALT: 23 U/L (ref 0–55)
AST: 15 U/L (ref 5–34)
Albumin: 3.5 g/dL (ref 3.5–5.0)
Anion Gap: 9 mEq/L (ref 3–11)
BUN: 19.1 mg/dL (ref 7.0–26.0)
CALCIUM: 9.9 mg/dL (ref 8.4–10.4)
CHLORIDE: 97 meq/L — AB (ref 98–109)
CO2: 28 mEq/L (ref 22–29)
CREATININE: 1.2 mg/dL (ref 0.7–1.3)
EGFR: 58 mL/min/{1.73_m2} — ABNORMAL LOW (ref >90)
Glucose: 98 mg/dl (ref 70–140)
Potassium: 3.5 mEq/L (ref 3.5–5.1)
Sodium: 134 mEq/L — ABNORMAL LOW (ref 136–145)
Total Bilirubin: 1.7 mg/dL — ABNORMAL HIGH (ref 0.20–1.20)
Total Protein: 5.8 g/dL — ABNORMAL LOW (ref 6.4–8.3)

## 2015-06-16 ENCOUNTER — Other Ambulatory Visit: Payer: Self-pay | Admitting: *Deleted

## 2015-06-16 DIAGNOSIS — C9000 Multiple myeloma not having achieved remission: Secondary | ICD-10-CM

## 2015-06-16 LAB — KAPPA/LAMBDA LIGHT CHAINS
Kappa free light chain: 2.43 mg/dL — ABNORMAL HIGH (ref 0.33–1.94)
Kappa:Lambda Ratio: 0.32 (ref 0.26–1.65)
LAMBDA FREE LGHT CHN: 7.67 mg/dL — AB (ref 0.57–2.63)

## 2015-06-16 MED ORDER — LENALIDOMIDE 25 MG PO CAPS: 25.0000 mg | ORAL_CAPSULE | Freq: Every day | ORAL | Status: DC

## 2015-06-19 ENCOUNTER — Telehealth: Payer: Self-pay | Admitting: *Deleted

## 2015-06-19 NOTE — Telephone Encounter (Signed)
Message from [place] with [facility] reporting pt's daughter informed them his Revlimid dose is to be decreased. Discussed with Dr. Benay Spice: When labs plateau, will change to maintenance dose. Pt to continue with current dose for now. Left message informing Rosemarie Ax. Left message on voicemail for [facility] clarifying plan as well.

## 2015-06-20 ENCOUNTER — Ambulatory Visit (HOSPITAL_BASED_OUTPATIENT_CLINIC_OR_DEPARTMENT_OTHER): Payer: Medicare Other | Admitting: Oncology

## 2015-06-20 ENCOUNTER — Telehealth: Payer: Self-pay | Admitting: Oncology

## 2015-06-20 VITALS — BP 122/61 | HR 86 | Temp 98.4°F | Resp 18 | Ht 68.0 in | Wt 175.2 lb

## 2015-06-20 DIAGNOSIS — C9000 Multiple myeloma not having achieved remission: Secondary | ICD-10-CM | POA: Diagnosis not present

## 2015-06-20 DIAGNOSIS — I10 Essential (primary) hypertension: Secondary | ICD-10-CM

## 2015-06-20 DIAGNOSIS — D801 Nonfamilial hypogammaglobulinemia: Secondary | ICD-10-CM | POA: Diagnosis not present

## 2015-06-20 DIAGNOSIS — I4891 Unspecified atrial fibrillation: Secondary | ICD-10-CM

## 2015-06-20 DIAGNOSIS — E871 Hypo-osmolality and hyponatremia: Secondary | ICD-10-CM

## 2015-06-20 NOTE — Telephone Encounter (Signed)
Pt confirmed labs/ov per 07/12 POF, gave pt AVS and Calendar... KJ °

## 2015-06-20 NOTE — Progress Notes (Signed)
  [place] OFFICE PROGRESS NOTE   Diagnosis: Multiple myeloma   INTERVAL HISTORY:   Richard [name] returns as scheduled. He continues Revlimid/Decadron. He reports intermittent cramping discomfort at the right lower back. No other complaint. He is active walking.  Objective:  Vital signs in last 24 hours:  Blood pressure 122/61, pulse 86, temperature 98.4 F (36.9 C), temperature source Oral, resp. rate 18, height _0  (1.727 m), weight 175 lb 3.2 oz (79.47 kg), SpO2 96 %.    HEENT: No thrush or ulcers Resp: Lungs clear bilaterally Cardio: Regular rate and rhythm GI: No hepatosplenomegaly Vascular: No leg edema   Lab Results:  Lab Results  Component Value Date   WBC 3.6* 06/15/2015   HGB 11.5* 06/15/2015   HCT 33.3* 06/15/2015   MCV 94.4 06/15/2015   PLT 98* 06/15/2015   NEUTROABS 2.3 06/15/2015   Free lambda light chains-7.67  Medications: I have reviewed the patient's current medications.  Assessment/Plan: 1. Multiple myeloma: The serum free light chains were elevated 08/29/2009. A CT of the chest 08/31/2009 showed a new pleural mass consistent with progression of multiple myeloma. He completed 12 cycles of Revlimid/Decadron. The serum free light chains were improved to 4.34 on 12/29/2009. A restaging CT of the chest 02/19/2010 showed resolution of right axillary lymphadenopathy and a pleural-based mass. The serum free lambda light chains are now elevated.  Salvage Revlimid/Decadron initiated 12/08/2014  Cycle 2 01/05/2015  Serum light chains improved 01/24/2015  Cycle 3 02/01/2015  Cycle 4 03/02/2015  Cycle 5 03/30/2015  Cycle 6 04/27/2015  Cycle 7 05/25/2015  Cycle 8 06/22/2015 2.History of back pain, likely related to the pleural-based mass at the right chest, resolved. 2. Right axillary/subpectoral lymphadenopathy on a CT of the chest 08/31/2009, likely related to multiple myeloma, resolved. 3. 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 He is maintained on every three-month Zometa. 4. Pancytopenia secondary to Revlimid and multiple myeloma. He has persistent mild thrombocytopenia. 5. Hypertension. 6. Status post hip replacement. 7. History of back surgery. 8. History of trigeminal neuralgia. 9. Status post removal of a lipoma from the right axilla in February 2010. 10. 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 11. Esophageal reflux disease, followed by Dr. Janace Hoard.  12. Esophageal stricture, status post an evaluation by Dr. Henrene Pastor. 13. Right vocal cord lesion, status post a biopsy by Dr. 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 He completed radiation under the direction of Dr. Valere Dross on 03/15/2011. 14. Admission with pneumonia 03/26/2010. 15. Atrial flutter/fibrillation while hospitalized August 2011. He is followed by Dr. Percival Spanish. 16. Elevated prostate specific antigen, status post a biopsy 01/23/2011. He was found to have a Gleason 6 cancer in 10% of the cores. He is followed with an observation approach. He is now followed by Dr. Diona Fanti. 17. History of Mild hypercalcemia  18. Fall with a left wrist fracture June 2015  19. Right chest wall pain secondary to a right second rib plasmacytoma confirmed on a CT 11/28/2014, resolved 20. Hyponatremia-improved    Disposition:  Richard [name] appears stable. He will begin another cycle of Revlimid on 05/23/2015. He continues weekly Decadron. He will return for a lab visit on 07/13/2015 and an office visit 07/18/2015. The plan is to continue monthly cycles of Revlimid/Decadron until the lambda light chains plateau.  Richard Coder, MD  06/20/2015  5:36 PM

## 2015-07-12 ENCOUNTER — Other Ambulatory Visit: Payer: Self-pay | Admitting: *Deleted

## 2015-07-12 DIAGNOSIS — C9000 Multiple myeloma not having achieved remission: Secondary | ICD-10-CM

## 2015-07-12 MED ORDER — LENALIDOMIDE 25 MG PO CAPS: 25.0000 mg | ORAL_CAPSULE | Freq: Every day | ORAL | Status: DC

## 2015-07-13 ENCOUNTER — Other Ambulatory Visit (HOSPITAL_BASED_OUTPATIENT_CLINIC_OR_DEPARTMENT_OTHER): Payer: Medicare Other

## 2015-07-13 DIAGNOSIS — C9000 Multiple myeloma not having achieved remission: Secondary | ICD-10-CM

## 2015-07-13 LAB — CBC WITH DIFFERENTIAL/PLATELET
BASO%: 0.6 % (ref 0.0–2.0)
Basophils Absolute: 0 10*3/uL (ref 0.0–0.1)
EOS ABS: 0.2 10*3/uL (ref 0.0–0.5)
EOS%: 5.3 % (ref 0.0–7.0)
HEMATOCRIT: 33.9 % — AB (ref 38.4–49.9)
HEMOGLOBIN: 11.6 g/dL — AB (ref 13.0–17.1)
LYMPH%: 19.3 % (ref 14.0–49.0)
MCH: 32.6 pg (ref 27.2–33.4)
MCHC: 34.2 g/dL (ref 32.0–36.0)
MCV: 95.1 fL (ref 79.3–98.0)
MONO#: 0.6 10*3/uL (ref 0.1–0.9)
MONO%: 15.3 % — ABNORMAL HIGH (ref 0.0–14.0)
NEUT%: 59.5 % (ref 39.0–75.0)
NEUTROS ABS: 2.4 10*3/uL (ref 1.5–6.5)
Platelets: 98 10*3/uL — ABNORMAL LOW (ref 140–400)
RBC: 3.56 10*6/uL — AB (ref 4.20–5.82)
RDW: 17.5 % — AB (ref 11.0–14.6)
WBC: 4 10*3/uL (ref 4.0–10.3)
lymph#: 0.8 10*3/uL — ABNORMAL LOW (ref 0.9–3.3)

## 2015-07-13 LAB — COMPREHENSIVE METABOLIC PANEL (CC13)
ALT: 27 U/L (ref 0–55)
AST: 17 U/L (ref 5–34)
Albumin: 3.6 g/dL (ref 3.5–5.0)
Alkaline Phosphatase: 53 U/L (ref 40–150)
Anion Gap: 6 mEq/L (ref 3–11)
BUN: 14.3 mg/dL (ref 7.0–26.0)
CO2: 28 mEq/L (ref 22–29)
CREATININE: 1.1 mg/dL (ref 0.7–1.3)
Calcium: 9.2 mg/dL (ref 8.4–10.4)
Chloride: 99 mEq/L (ref 98–109)
EGFR: 60 mL/min/{1.73_m2} — ABNORMAL LOW (ref >90)
Glucose: 108 mg/dl (ref 70–140)
POTASSIUM: 3.6 meq/L (ref 3.5–5.1)
Sodium: 133 mEq/L — ABNORMAL LOW (ref 136–145)
Total Bilirubin: 1.65 mg/dL — ABNORMAL HIGH (ref 0.20–1.20)
Total Protein: 5.6 g/dL — ABNORMAL LOW (ref 6.4–8.3)

## 2015-07-14 LAB — KAPPA/LAMBDA LIGHT CHAINS
Kappa free light chain: 1.87 mg/dL (ref 0.33–1.94)
Kappa:Lambda Ratio: 0.39 (ref 0.26–1.65)
Lambda Free Lght Chn: 4.84 mg/dL — ABNORMAL HIGH (ref 0.57–2.63)

## 2015-07-17 ENCOUNTER — Telehealth: Payer: Self-pay | Admitting: *Deleted

## 2015-07-17 NOTE — Telephone Encounter (Signed)
[facility] sent facsimile confirmation of Revlimid prescription shipment.  Revlimid was shipped on 07-14-2015 with next business day delivery.    

## 2015-07-18 ENCOUNTER — Telehealth: Payer: Self-pay | Admitting: Oncology

## 2015-07-18 ENCOUNTER — Ambulatory Visit (HOSPITAL_BASED_OUTPATIENT_CLINIC_OR_DEPARTMENT_OTHER): Payer: Medicare Other | Admitting: Oncology

## 2015-07-18 ENCOUNTER — Ambulatory Visit: Payer: Medicare Other | Admitting: Radiation Oncology

## 2015-07-18 VITALS — BP 135/64 | HR 83 | Temp 99.5°F | Resp 18 | Ht 68.0 in | Wt 177.8 lb

## 2015-07-18 DIAGNOSIS — D61810 Antineoplastic chemotherapy induced pancytopenia: Secondary | ICD-10-CM

## 2015-07-18 DIAGNOSIS — D801 Nonfamilial hypogammaglobulinemia: Secondary | ICD-10-CM | POA: Diagnosis not present

## 2015-07-18 DIAGNOSIS — D696 Thrombocytopenia, unspecified: Secondary | ICD-10-CM

## 2015-07-18 DIAGNOSIS — C9000 Multiple myeloma not having achieved remission: Secondary | ICD-10-CM | POA: Diagnosis not present

## 2015-07-18 DIAGNOSIS — I10 Essential (primary) hypertension: Secondary | ICD-10-CM

## 2015-07-18 NOTE — Progress Notes (Signed)
[place] OFFICE PROGRESS NOTE   Diagnosis: Multiple myeloma  INTERVAL HISTORY:   Richard [name] returns as scheduled. He continues Revlimid/Decadron. He reports "cramping" in the hands. No other complaint. Good energy level.  Objective:  Vital signs in last 24 hours:  Blood pressure 135/64, pulse 83, temperature 99.5 F (37.5 C), temperature source Oral, resp. rate 18, height 5' 8"  (1.727 m), weight 177 lb 12.8 oz (80.65 kg), SpO2 98 %.    HEENT: No thrush Resp: Lungs clear bilaterally Cardio: Regular rate and rhythm GI: No hepatosplenomegaly Vascular: No leg edema   Lab Results:  Lab Results  Component Value Date   WBC 4.0 07/13/2015   HGB 11.6* 07/13/2015   HCT 33.9* 07/13/2015   MCV 95.1 07/13/2015   PLT 98* 07/13/2015   NEUTROABS 2.4 07/13/2015   creatinine 1.1, calcium 9.2, alkaline phosphatase 53, bilirubin 1.65 Lambda light chains 4.84   Medications: I have reviewed the patient's current medications.  Assessment/Plan: 1. Multiple myeloma: The serum free light chains were elevated 08/29/2009. A CT of the chest 08/31/2009 showed a new pleural mass consistent with progression of multiple myeloma. He completed 12 cycles of Revlimid/Decadron. The serum free light chains were improved to 4.34 on 12/29/2009. A restaging CT of the chest 02/19/2010 showed resolution of right axillary lymphadenopathy and a pleural-based mass. The serum free lambda light chains are now elevated.  Salvage Revlimid/Decadron initiated 12/08/2014  Cycle 2 01/05/2015  Serum light chains improved 01/24/2015  Cycle 3 02/01/2015  Cycle 4 03/02/2015  Cycle 5 03/30/2015  Cycle 6 04/27/2015  Cycle 7 05/25/2015  Cycle 8 06/22/2015  Cycle 9 07/20/2015 2.History of back pain, likely related to the pleural-based mass at the right chest, resolved. 2. Right axillary/subpectoral lymphadenopathy on a CT of the chest 08/31/2009, likely related to multiple myeloma,  resolved. 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 He is maintained on every three-month Zometa. 4. Pancytopenia secondary to Revlimid and multiple myeloma. He has persistent mild thrombocytopenia. 5. Hypertension. 6. Status post hip replacement. 7. History of back surgery. 8. History of trigeminal neuralgia. 9. Status post removal of a lipoma from the right axilla in February 2010. 10. 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 11. Esophageal reflux disease, followed by Dr. Janace Hoard.  12. Esophageal stricture, status post an evaluation by Dr. Henrene Pastor. 13. Right vocal cord lesion, status post a biopsy by Dr. 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 He completed radiation under the direction of Dr. Valere Dross on 03/15/2011. 14. Admission with pneumonia 03/26/2010. 15. Atrial flutter/fibrillation while hospitalized August 2011. He is followed by Dr. Percival Spanish. 16. Elevated prostate specific antigen, status post a biopsy 01/23/2011. He was found to have a Gleason 6 cancer in 10% of the cores. He is followed with an observation approach. He is now followed by Dr. Diona Fanti. 17. History of Mild hypercalcemia  18. Fall with a left wrist fracture June 2015  19. Right chest wall pain secondary to a right second rib plasmacytoma confirmed on a CT 11/28/2014, resolved 20. Hyponatremia-improved 21.    Disposition:  Richard [name] appears stable. He will complete another cycle of Revlimid/Decadron beginning 07/20/2015. The bilirubin has been mildly elevated this year-etiology unclear. He will contact us for jaundice.  Richard [name] will return for an office visit and Zometa in one month. The serum light chains continue to improve. We will switch to maintenance Revlimid one to 2 cycles after a plateau in the lambda light chains.  He  had a 23 valent pneumococcal vaccine on 08/09/2010.  Betsy Coder, MD  07/18/2015  10:13 AM

## 2015-07-18 NOTE — Telephone Encounter (Signed)
Gave and printed appt sched and avs for pt for Sept °

## 2015-08-01 ENCOUNTER — Ambulatory Visit
Admission: RE | Admit: 2015-08-01 | Discharge: 2015-08-01 | Disposition: A | Discharge status: Home / Self Care | Payer: BLUE CROSS/BLUE SHIELD | Source: Ambulatory Visit | Attending: Radiation Oncology | Admitting: Radiation Oncology

## 2015-08-01 ENCOUNTER — Encounter: Payer: Self-pay | Admitting: Radiation Oncology

## 2015-08-01 VITALS — BP 143/75 | HR 83 | Temp 98.1°F | Ht 68.0 in | Wt 180.6 lb

## 2015-08-01 DIAGNOSIS — C320 Malignant neoplasm of glottis: Secondary | ICD-10-CM

## 2015-08-01 NOTE — Progress Notes (Addendum)
Mr/ Bones here for reassessment s/p XRT to his right vocal cord.  Note improvement of voice quality.  He reports  That he can eat anything he wants without any pain nor difficulty swallowing.  He states that his visits with Barry Dienes rotate, with Radiation Oncology visits. Denies any pain today.  BP 143/75 mmHg  Pulse 83  Temp(Src) 98.1 F (36.7 C)  Ht 5\' 8"  (1.727 m)  Wt 180 lb 9.6 oz (81.92 kg)  BMI 27.47 kg/m2   Wt Readings from Last 3 Encounters:  08/01/15 180 lb 9.6 oz (81.92 kg)  07/18/15 177 lb 12.8 oz (80.65 kg)  06/20/15 175 lb 3.2 oz (79.47 kg)

## 2015-08-01 NOTE — Progress Notes (Signed)
Dr. 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  He is undergoing chemotherapy for his recent myeloma progression of the direction of Richard [name].  He is on corticosteroids and Revlimid.  He believes that his force is improved.  He is being followed by Richard [name] as well.  Physical examination: Nodes: There is no palpable lymphadenopathy in the neck.  Oral cavity and oropharynx unremarkable to inspection.  On indirect mirror examination both true vocal cords move well and there is no evidence for recurrent disease along his right true vocal cord.  Impression no evidence for recurrence of vocal cord cancer.  Plan: I told Richard [name] that he is, in all likelihood, he is cured of his vocal cord cancer.  He should see Richard [name] for follow-up, primarily to pick up any other new head and neck cancer.  I've not scheduled Richard [name] for a formal follow-up visit, and I wished him well.

## 2015-08-10 ENCOUNTER — Other Ambulatory Visit: Payer: Self-pay | Admitting: *Deleted

## 2015-08-10 ENCOUNTER — Other Ambulatory Visit (HOSPITAL_BASED_OUTPATIENT_CLINIC_OR_DEPARTMENT_OTHER): Payer: Medicare Other

## 2015-08-10 DIAGNOSIS — C9000 Multiple myeloma not having achieved remission: Secondary | ICD-10-CM

## 2015-08-10 LAB — CBC WITH DIFFERENTIAL/PLATELET
BASO%: 0.3 % (ref 0.0–2.0)
BASOS ABS: 0 10*3/uL (ref 0.0–0.1)
EOS%: 6.7 % (ref 0.0–7.0)
Eosinophils Absolute: 0.2 10*3/uL (ref 0.0–0.5)
HEMATOCRIT: 34.5 % — AB (ref 38.4–49.9)
HGB: 11.8 g/dL — ABNORMAL LOW (ref 13.0–17.1)
LYMPH%: 23.3 % (ref 14.0–49.0)
MCH: 32.4 pg (ref 27.2–33.4)
MCHC: 34.3 g/dL (ref 32.0–36.0)
MCV: 94.4 fL (ref 79.3–98.0)
MONO#: 0.5 10*3/uL (ref 0.1–0.9)
MONO%: 16 % — ABNORMAL HIGH (ref 0.0–14.0)
NEUT#: 1.7 10*3/uL (ref 1.5–6.5)
NEUT%: 53.7 % (ref 39.0–75.0)
Platelets: 93 10*3/uL — ABNORMAL LOW (ref 140–400)
RBC: 3.65 10*6/uL — AB (ref 4.20–5.82)
RDW: 17 % — ABNORMAL HIGH (ref 11.0–14.6)
WBC: 3.2 10*3/uL — ABNORMAL LOW (ref 4.0–10.3)
lymph#: 0.8 10*3/uL — ABNORMAL LOW (ref 0.9–3.3)

## 2015-08-10 LAB — COMPREHENSIVE METABOLIC PANEL (CC13)
ALT: 29 U/L (ref 0–55)
AST: 20 U/L (ref 5–34)
Albumin: 3.5 g/dL (ref 3.5–5.0)
Alkaline Phosphatase: 53 U/L (ref 40–150)
Anion Gap: 6 mEq/L (ref 3–11)
BILIRUBIN TOTAL: 1.56 mg/dL — AB (ref 0.20–1.20)
BUN: 18.3 mg/dL (ref 7.0–26.0)
CALCIUM: 9.7 mg/dL (ref 8.4–10.4)
CHLORIDE: 100 meq/L (ref 98–109)
CO2: 29 meq/L (ref 22–29)
Creatinine: 1.1 mg/dL (ref 0.7–1.3)
EGFR: 62 mL/min/{1.73_m2} — AB (ref >90)
Glucose: 97 mg/dl (ref 70–140)
Potassium: 3.6 mEq/L (ref 3.5–5.1)
Sodium: 135 mEq/L — ABNORMAL LOW (ref 136–145)
Total Protein: 5.7 g/dL — ABNORMAL LOW (ref 6.4–8.3)

## 2015-08-10 MED ORDER — LENALIDOMIDE 25 MG PO CAPS: 25.0000 mg | ORAL_CAPSULE | Freq: Every day | ORAL | Status: DC

## 2015-08-11 LAB — KAPPA/LAMBDA LIGHT CHAINS
KAPPA FREE LGHT CHN: 2.64 mg/dL — AB (ref 0.33–1.94)
Kappa:Lambda Ratio: 0.55 (ref 0.26–1.65)
LAMBDA FREE LGHT CHN: 4.81 mg/dL — AB (ref 0.57–2.63)

## 2015-08-16 ENCOUNTER — Ambulatory Visit (HOSPITAL_BASED_OUTPATIENT_CLINIC_OR_DEPARTMENT_OTHER): Payer: Medicare Other

## 2015-08-16 ENCOUNTER — Ambulatory Visit (HOSPITAL_BASED_OUTPATIENT_CLINIC_OR_DEPARTMENT_OTHER): Payer: Medicare Other | Admitting: Oncology

## 2015-08-16 VITALS — BP 154/63 | HR 75 | Temp 97.7°F | Resp 18 | Ht 68.0 in | Wt 181.0 lb

## 2015-08-16 DIAGNOSIS — Z23 Encounter for immunization: Secondary | ICD-10-CM

## 2015-08-16 DIAGNOSIS — C9000 Multiple myeloma not having achieved remission: Secondary | ICD-10-CM

## 2015-08-16 MED ORDER — INFLUENZA VAC SPLIT QUAD 0.5 ML IM SUSY
0.5000 mL | PREFILLED_SYRINGE | Freq: Once | INTRAMUSCULAR | Status: AC
  Administered 2015-08-16: 0.5 mL via INTRAMUSCULAR
  Filled 2015-08-16: qty 0.5

## 2015-08-16 MED ORDER — ZOLEDRONIC ACID 4 MG/100ML IV SOLN
4.0000 mg | Freq: Once | INTRAVENOUS | Status: AC
  Administered 2015-08-16: 4 mg via INTRAVENOUS
  Filled 2015-08-16: qty 100

## 2015-08-16 NOTE — Patient Instructions (Signed)
Zoledronic Acid injection (Hypercalcemia, Oncology) (Zometa) What is this medicine? ZOLEDRONIC ACID (ZOE le dron ik AS id) lowers the amount of calcium loss from bone. It is used to treat too much calcium in your blood from cancer. It is also used to prevent complications of cancer that has spread to the bone. This medicine may be used for other purposes; ask your health care provider or pharmacist if you have questions. COMMON BRAND NAME(S): Zometa What should I tell my health care provider before I take this medicine? They need to know if you have any of these conditions: -aspirin-sensitive asthma -cancer, especially if you are receiving medicines used to treat cancer -dental disease or wear dentures -infection -kidney disease -receiving corticosteroids like dexamethasone or prednisone -an unusual or allergic reaction to zoledronic acid, other medicines, foods, dyes, or preservatives -pregnant or trying to get pregnant -breast-feeding How should I use this medicine? This medicine is for infusion into a vein. It is given by a health care professional in a hospital or clinic setting. Talk to your pediatrician regarding the use of this medicine in children. Special care may be needed. Overdosage: If you think you have taken too much of this medicine contact a poison control center or emergency room at once. NOTE: This medicine is only for you. Do not share this medicine with others. What if I miss a dose? It is important not to miss your dose. Call your doctor or health care professional if you are unable to keep an appointment. What may interact with this medicine? -certain antibiotics given by injection -NSAIDs, medicines for pain and inflammation, like ibuprofen or naproxen -some diuretics like bumetanide, furosemide -teriparatide -thalidomide This list may not describe all possible interactions. Give your health care provider a list of all the medicines, herbs, non-prescription drugs,  or dietary supplements you use. Also tell them if you smoke, drink alcohol, or use illegal drugs. Some items may interact with your medicine. What should I watch for while using this medicine? Visit your doctor or health care professional for regular checkups. It may be some time before you see the benefit from this medicine. Do not stop taking your medicine unless your doctor tells you to. Your doctor may order blood tests or other tests to see how you are doing. Women should inform their doctor if they wish to become pregnant or think they might be pregnant. There is a potential for serious side effects to an unborn child. Talk to your health care professional or pharmacist for more information. You should make sure that you get enough calcium and vitamin D while you are taking this medicine. Discuss the foods you eat and the vitamins you take with your health care professional. Some people who take this medicine have severe bone, joint, and/or muscle pain. This medicine may also increase your risk for jaw problems or a broken thigh bone. Tell your doctor right away if you have severe pain in your jaw, bones, joints, or muscles. Tell your doctor if you have any pain that does not go away or that gets worse. Tell your dentist and dental surgeon that you are taking this medicine. You should not have major dental surgery while on this medicine. See your dentist to have a dental exam and fix any dental problems before starting this medicine. Take good care of your teeth while on this medicine. Make sure you see your dentist for regular follow-up appointments. What side effects may I notice from receiving this medicine? Side effects   that you should report to your doctor or health care professional as soon as possible: -allergic reactions like skin rash, itching or hives, swelling of the face, lips, or tongue -anxiety, confusion, or depression -breathing problems -changes in vision -eye pain -feeling faint  or lightheaded, falls -jaw pain, especially after dental work -mouth sores -muscle cramps, stiffness, or weakness -trouble passing urine or change in the amount of urine Side effects that usually do not require medical attention (report to your doctor or health care professional if they continue or are bothersome): -bone, joint, or muscle pain -constipation -diarrhea -fever -hair loss -irritation at site where injected -loss of appetite -nausea, vomiting -stomach upset -trouble sleeping -trouble swallowing -weak or tired This list may not describe all possible side effects. Call your doctor for medical advice about side effects. You may report side effects to FDA at 1-800-FDA-1088. Where should I keep my medicine? This drug is given in a hospital or clinic and will not be stored at home. NOTE: This sheet is a summary. It may not cover all possible information. If you have questions about this medicine, talk to your doctor, pharmacist, or health care provider.  2015, Elsevier/Gold Standard. (2013-05-06 13:03:13)  

## 2015-08-16 NOTE — Progress Notes (Signed)
[place] OFFICE PROGRESS NOTE   Diagnosis: Multiple myeloma  INTERVAL HISTORY:   Richard [name] returns as scheduled. He continues Revlimid/Decadron. He is due to start another cycle on 08/17/2015. He feels well. He reports "soreness "in the thighs after doing "squats ". Mild low leg/ankle edema bilaterally. Stable "stiffness "in the hands. Good appetite and energy level.  Objective:  Vital signs in last 24 hours:  Blood pressure 154/63, pulse 75, temperature 97.7 F (36.5 C), temperature source Oral, resp. rate 18, height _0  (1.727 m), weight 181 lb (82.101 kg), SpO2 98 %.    HEENT: No thrush or ulcers Resp: Lungs clear bilaterally Cardio: Regular rhythm with occasional premature beats or a pulse GI: No hepatosplenic the, nontender Vascular: Trace pitting edema at the ankle bilaterally   Lab Results:  Lab Results  Component Value Date   WBC 3.2* 08/10/2015   HGB 11.8* 08/10/2015   HCT 34.5* 08/10/2015   MCV 94.4 08/10/2015   PLT 93* 08/10/2015   NEUTROABS 1.7 08/10/2015   Lambda free light chains 4.81   Medications: I have reviewed the patient's current medications.  Assessment/Plan: 1. Multiple myeloma: The serum free light chains were elevated 08/29/2009. A CT of the chest 08/31/2009 showed a new pleural mass consistent with progression of multiple myeloma. He completed 12 cycles of Revlimid/Decadron. The serum free light chains were improved to 4.34 on 12/29/2009. A restaging CT of the chest 02/19/2010 showed resolution of right axillary lymphadenopathy and a pleural-based mass. The serum free lambda light chains were again elevated.  Salvage Revlimid/Decadron initiated 12/08/2014  Cycle 2 01/05/2015  Serum light chains improved 01/24/2015  Cycle 3 02/01/2015  Cycle 4 03/02/2015  Cycle 5 03/30/2015  Cycle 6 04/27/2015  Cycle 7 05/25/2015  Cycle 8 06/22/2015  Cycle 9 07/20/2015  Cycle 10 08/18/2015 2.History of back pain, likely  related to the pleural-based mass at the right chest, resolved. 2. Right axillary/subpectoral lymphadenopathy on a CT of the chest 08/31/2009, likely related to multiple myeloma, resolved. 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 He is maintained on every three-month Zometa. 4. Pancytopenia secondary to Revlimid and multiple myeloma. He has persistent mild thrombocytopenia. 5. Hypertension. 6. Status post hip replacement. 7. History of back surgery. 8. History of trigeminal neuralgia. 9. Status post removal of a lipoma from the right axilla in February 2010. 10. 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 11. Esophageal reflux disease, followed by Dr. Janace Hoard.  12. Esophageal stricture, status post an evaluation by Dr. Henrene Pastor. 13. Right vocal cord lesion, status post a biopsy by Dr. 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 He completed radiation under the direction of Dr. Valere Dross on 03/15/2011. 14. Admission with pneumonia 03/26/2010. 15. History of Atrial flutter/fibrillation while hospitalized August 2011. He is followed by Dr. Percival Spanish. 16. Elevated prostate specific antigen, status post a biopsy 01/23/2011. He was found to have a Gleason 6 cancer in 10% of the cores. He is followed with an observation approach. He is now followed by Dr. Diona Fanti. 17. History of Mild hypercalcemia  18. Fall with a left wrist fracture June 2015  19. Right chest wall pain secondary to a right second rib plasmacytoma confirmed on a CT 11/28/2014, resolved 20. History of Hyponatremia-improved   Disposition:  Richard [name] appears stable. The serum free light chains have stabilized over the past few months. He will complete 1 more cycle of Revlimid/Decadron. If the lambda light chains are stable he will be switched  to a maintenance Revlimid regimen.  Richard [name] will receive an influenza vaccine and Zometa today.  Betsy Coder,  MD  08/16/2015  10:52 AM

## 2015-08-17 ENCOUNTER — Telehealth: Payer: Self-pay | Admitting: *Deleted

## 2015-08-17 DIAGNOSIS — C9000 Multiple myeloma not having achieved remission: Secondary | ICD-10-CM

## 2015-08-17 MED ORDER — LENALIDOMIDE 25 MG PO CAPS: 25.0000 mg | ORAL_CAPSULE | Freq: Every day | ORAL | Status: DC

## 2015-08-17 NOTE — Telephone Encounter (Signed)
DR.SHERRILL'S NURSE, ALEXIS ROBINSON,RN HAS TAKEN CARE OF PT.'S REFILL FOR REVLIMID.

## 2015-08-17 NOTE — Telephone Encounter (Signed)
Daughter called and stated Revlimid has not been refilled. I assured her the prescription had been sent in previously this week, but I would resend. She appreciated this. I called [facility] and they could not see refill request, so I resubmitted refill. Spoke to Adams at [facility] who stated she would fill prescription stat and call pt/daughter when completed.

## 2015-08-17 NOTE — Telephone Encounter (Signed)
Received two forwarded messages from triage from daughter requesting if refill has been sent, also VM from [facility] stating a new script needed to be sent in (these are old VMs from earlier this morning.) I called [facility] to confirm order for revlimid has been processed: spoke to [place] who stated order has been submitted, but they are waiting on copay assistance (there should be funds available, as this was available to pt when initially obtaining Revlimid). I relayed this information to pt's daughter and encouraged her to call [facility] for all other questions, as we have completed everything on our end. She voices understanding and is appreciative of our efforts to get revlimid delivered.

## 2015-08-28 ENCOUNTER — Telehealth: Payer: Self-pay | Admitting: Oncology

## 2015-08-28 NOTE — Telephone Encounter (Signed)
Pt called to r/s due to medication he has started later than expected, confirmed D/T with labs/ov.... KJ

## 2015-09-08 ENCOUNTER — Other Ambulatory Visit: Payer: Medicare Other

## 2015-09-13 ENCOUNTER — Ambulatory Visit: Payer: Medicare Other | Admitting: Oncology

## 2015-09-13 ENCOUNTER — Other Ambulatory Visit (HOSPITAL_BASED_OUTPATIENT_CLINIC_OR_DEPARTMENT_OTHER): Payer: Medicare Other

## 2015-09-13 ENCOUNTER — Other Ambulatory Visit: Payer: Medicare Other

## 2015-09-13 DIAGNOSIS — C9000 Multiple myeloma not having achieved remission: Secondary | ICD-10-CM

## 2015-09-13 LAB — COMPREHENSIVE METABOLIC PANEL (CC13)
ALBUMIN: 3.5 g/dL (ref 3.5–5.0)
ALK PHOS: 49 U/L (ref 40–150)
ALT: 25 U/L (ref 0–55)
ANION GAP: 6 meq/L (ref 3–11)
AST: 16 U/L (ref 5–34)
BILIRUBIN TOTAL: 1.64 mg/dL — AB (ref 0.20–1.20)
BUN: 13.7 mg/dL (ref 7.0–26.0)
CALCIUM: 9.4 mg/dL (ref 8.4–10.4)
CO2: 30 meq/L — AB (ref 22–29)
CREATININE: 1 mg/dL (ref 0.7–1.3)
Chloride: 96 mEq/L — ABNORMAL LOW (ref 98–109)
EGFR: 74 mL/min/{1.73_m2} — ABNORMAL LOW (ref >90)
Glucose: 107 mg/dl (ref 70–140)
Potassium: 3.4 mEq/L — ABNORMAL LOW (ref 3.5–5.1)
Sodium: 131 mEq/L — ABNORMAL LOW (ref 136–145)
TOTAL PROTEIN: 5.6 g/dL — AB (ref 6.4–8.3)

## 2015-09-13 LAB — CBC WITH DIFFERENTIAL/PLATELET
BASO%: 0.1 % (ref 0.0–2.0)
Basophils Absolute: 0 10*3/uL (ref 0.0–0.1)
EOS ABS: 0.1 10*3/uL (ref 0.0–0.5)
EOS%: 4.4 % (ref 0.0–7.0)
HEMATOCRIT: 32.7 % — AB (ref 38.4–49.9)
HGB: 11.4 g/dL — ABNORMAL LOW (ref 13.0–17.1)
LYMPH#: 0.8 10*3/uL — AB (ref 0.9–3.3)
LYMPH%: 25.6 % (ref 14.0–49.0)
MCH: 32.6 pg (ref 27.2–33.4)
MCHC: 34.7 g/dL (ref 32.0–36.0)
MCV: 93.9 fL (ref 79.3–98.0)
MONO#: 0.5 10*3/uL (ref 0.1–0.9)
MONO%: 15.7 % — ABNORMAL HIGH (ref 0.0–14.0)
NEUT%: 54.2 % (ref 39.0–75.0)
NEUTROS ABS: 1.6 10*3/uL (ref 1.5–6.5)
PLATELETS: 91 10*3/uL — AB (ref 140–400)
RBC: 3.48 10*6/uL — ABNORMAL LOW (ref 4.20–5.82)
RDW: 16.7 % — ABNORMAL HIGH (ref 11.0–14.6)
WBC: 3 10*3/uL — AB (ref 4.0–10.3)

## 2015-09-14 LAB — KAPPA/LAMBDA LIGHT CHAINS
Kappa free light chain: 2.06 mg/dL — ABNORMAL HIGH (ref 0.33–1.94)
Kappa:Lambda Ratio: 0.26 (ref 0.26–1.65)
Lambda Free Lght Chn: 8.03 mg/dL — ABNORMAL HIGH (ref 0.57–2.63)

## 2015-09-19 ENCOUNTER — Encounter: Payer: Self-pay | Admitting: Oncology

## 2015-09-19 NOTE — Progress Notes (Signed)
Per becky at [facility], they need new script for revlimid. I called and left message for nurse for dr. Benay Spice.

## 2015-09-19 NOTE — Progress Notes (Signed)
Per Ubaldo Glassing patient has appt on 09/20/15 and will know what revlimid script looks like. I called becky back from [facility] to let her know we will wait to send them the script.

## 2015-09-20 ENCOUNTER — Other Ambulatory Visit: Payer: Self-pay | Admitting: *Deleted

## 2015-09-20 ENCOUNTER — Ambulatory Visit (HOSPITAL_BASED_OUTPATIENT_CLINIC_OR_DEPARTMENT_OTHER): Payer: Medicare Other | Admitting: Oncology

## 2015-09-20 ENCOUNTER — Telehealth: Payer: Self-pay | Admitting: Oncology

## 2015-09-20 VITALS — BP 137/75 | HR 73 | Temp 98.5°F | Resp 17 | Ht 68.0 in | Wt 177.4 lb

## 2015-09-20 DIAGNOSIS — C9000 Multiple myeloma not having achieved remission: Secondary | ICD-10-CM

## 2015-09-20 DIAGNOSIS — R1030 Lower abdominal pain, unspecified: Secondary | ICD-10-CM

## 2015-09-20 DIAGNOSIS — C9002 Multiple myeloma in relapse: Secondary | ICD-10-CM

## 2015-09-20 DIAGNOSIS — D801 Nonfamilial hypogammaglobulinemia: Secondary | ICD-10-CM

## 2015-09-20 DIAGNOSIS — I10 Essential (primary) hypertension: Secondary | ICD-10-CM

## 2015-09-20 MED ORDER — LENALIDOMIDE 10 MG PO CAPS: 10.0000 mg | ORAL_CAPSULE | Freq: Every day | ORAL | Status: DC

## 2015-09-20 NOTE — Telephone Encounter (Signed)
per pof to sch pt appt-per pt wanted to come 11/3 in stead of 11/4 per orders due to transportation

## 2015-09-20 NOTE — Telephone Encounter (Signed)
per pof to sch pt appt-gave pt copy of avs/calendat-sent email to Anne/Melissa to sch appt 11/9 @ 8:301 30 min-adv pt of appt time & date

## 2015-09-20 NOTE — Progress Notes (Signed)
[facility] OFFICE PROGRESS NOTE   Diagnosis: Multiple myeloma  INTERVAL HISTORY:   Richard [name] returns as scheduled. He completed another cycle of Revlimid/Decadron beginning 08/18/2015. He reports feeling well. He has developed discomfort in the left groin area. This may be related to performing "squats ". No other complaint. The discomfort improves with activity.  Objective:  Vital signs in last 24 hours:  Blood pressure 137/75, pulse 73, temperature 98.5 F (36.9 C), temperature source Oral, resp. rate 17, height 5' 8"  (1.727 m), weight 177 lb 6.4 oz (80.468 kg), SpO2 99 %.    Lymphatics: No axillary or inguinal nodes Resp: Lungs clear bilaterally Cardio: Regular rate and rhythm GI: No hepatosplenomegaly, no mass Vascular: No leg edema Musculoskeletal: The area of discomfort as at the upper inner left thigh overlying the tendons, no mass    Lab Results:  Lab Results  Component Value Date   WBC 3.0* 09/13/2015   HGB 11.4* 09/13/2015   HCT 32.7* 09/13/2015   MCV 93.9 09/13/2015   PLT 91* 09/13/2015   NEUTROABS 1.6 09/13/2015   Lambda free light chains 8.03  Medications: I have reviewed the patient's current medications.  Assessment/Plan: 1. Multiple myeloma: The serum free light chains were elevated 08/29/2009. A CT of the chest 08/31/2009 showed a new pleural mass consistent with progression of multiple myeloma. He completed 12 cycles of Revlimid/Decadron. The serum free light chains were improved to 4.34 on 12/29/2009. A restaging CT of the chest 02/19/2010 showed resolution of right axillary lymphadenopathy and a pleural-based mass. The serum free lambda light chains were again elevated.  Salvage Revlimid/Decadron initiated 12/08/2014  Cycle 2 01/05/2015  Serum light chains improved 01/24/2015  Cycle 3 02/01/2015  Cycle 4 03/02/2015  Cycle 5 03/30/2015  Cycle 6 04/27/2015  Cycle 7 05/25/2015  Cycle 8 06/22/2015  Cycle 9  07/20/2015  Cycle 10 08/18/2015  Changed to maintenance Revlimid beginning 09/23/2015 2.History of back pain, likely related to the pleural-based mass at the right chest, resolved. 2. Right axillary/subpectoral lymphadenopathy on a CT of the chest 08/31/2009, likely related to multiple myeloma, resolved. 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 He is maintained on every three-month Zometa. 4. Pancytopenia secondary to Revlimid and multiple myeloma. He has persistent mild thrombocytopenia. 5. Hypertension. 6. Status post hip replacement. 7. History of back surgery. 8. History of trigeminal neuralgia. 9. Status post removal of a lipoma from the right axilla in February 2010. 10. 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 11. Esophageal reflux disease, followed by Dr. Janace Hoard.  12. Esophageal stricture, status post an evaluation by Dr. Henrene Pastor. 13. Right vocal cord lesion, status post a biopsy by Dr. 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 He completed radiation under the direction of Dr. Valere Dross on 03/15/2011. 14. Admission with pneumonia 03/26/2010. 15. History of Atrial flutter/fibrillation while hospitalized August 2011. He is followed by Dr. Percival Spanish. 16. Elevated prostate specific antigen, status post a biopsy 01/23/2011. He was found to have a Gleason 6 cancer in 10% of the cores. He is followed with an observation approach. He is now followed by Dr. Diona Fanti. 17. History of Mild hypercalcemia  18. Fall with a left wrist fracture June 2015  19. Right chest wall pain secondary to a right second rib plasmacytoma confirmed on a CT 11/28/2014, resolved 20. History of Hyponatremia-improved     Disposition:  Richard [name] appears well. The serum light chains have stabilized over the past few months. Revlimid/Decadron was discontinued.  He will begin maintenance Revlimid at a dose of 10 mg daily. Richard [name] will return  for an office and lab visit in one month. I suspect the left groin discomfort is related to a benign musculoskeletal condition. He will contact us if this pain worsens.  He will continue every three-month Zometa.  Betsy Coder, MD  09/20/2015  12:44 PM

## 2015-09-20 NOTE — Telephone Encounter (Signed)
per pof to sch pt appt-cld & left a message on daughter voicemail to advise of pt appt time & date

## 2015-09-22 ENCOUNTER — Encounter: Payer: Self-pay | Admitting: Oncology

## 2015-09-22 NOTE — Progress Notes (Signed)
Per [facility] revlimid was shipped via fedex °

## 2015-10-12 ENCOUNTER — Other Ambulatory Visit (HOSPITAL_BASED_OUTPATIENT_CLINIC_OR_DEPARTMENT_OTHER): Payer: Medicare Other

## 2015-10-12 DIAGNOSIS — C9000 Multiple myeloma not having achieved remission: Secondary | ICD-10-CM

## 2015-10-12 DIAGNOSIS — C9002 Multiple myeloma in relapse: Secondary | ICD-10-CM

## 2015-10-12 LAB — CBC WITH DIFFERENTIAL/PLATELET
BASO%: 0.3 % (ref 0.0–2.0)
Basophils Absolute: 0 10*3/uL (ref 0.0–0.1)
EOS ABS: 0.1 10*3/uL (ref 0.0–0.5)
EOS%: 3.5 % (ref 0.0–7.0)
HCT: 33.2 % — ABNORMAL LOW (ref 38.4–49.9)
HEMOGLOBIN: 11.4 g/dL — AB (ref 13.0–17.1)
LYMPH%: 29.2 % (ref 14.0–49.0)
MCH: 31.9 pg (ref 27.2–33.4)
MCHC: 34.2 g/dL (ref 32.0–36.0)
MCV: 93.3 fL (ref 79.3–98.0)
MONO#: 0.5 10*3/uL (ref 0.1–0.9)
MONO%: 16 % — AB (ref 0.0–14.0)
NEUT%: 51 % (ref 39.0–75.0)
NEUTROS ABS: 1.5 10*3/uL (ref 1.5–6.5)
Platelets: 124 10*3/uL — ABNORMAL LOW (ref 140–400)
RBC: 3.56 10*6/uL — ABNORMAL LOW (ref 4.20–5.82)
RDW: 16.4 % — AB (ref 11.0–14.6)
WBC: 2.9 10*3/uL — AB (ref 4.0–10.3)
lymph#: 0.9 10*3/uL (ref 0.9–3.3)

## 2015-10-12 LAB — COMPREHENSIVE METABOLIC PANEL (CC13)
ALBUMIN: 3.5 g/dL (ref 3.5–5.0)
ALK PHOS: 63 U/L (ref 40–150)
ALT: 28 U/L (ref 0–55)
AST: 22 U/L (ref 5–34)
Anion Gap: 5 mEq/L (ref 3–11)
BILIRUBIN TOTAL: 1.04 mg/dL (ref 0.20–1.20)
BUN: 11.5 mg/dL (ref 7.0–26.0)
CO2: 26 meq/L (ref 22–29)
Calcium: 9.5 mg/dL (ref 8.4–10.4)
Chloride: 100 mEq/L (ref 98–109)
Creatinine: 1.1 mg/dL (ref 0.7–1.3)
EGFR: 66 mL/min/{1.73_m2} — ABNORMAL LOW (ref >90)
GLUCOSE: 102 mg/dL (ref 70–140)
Potassium: 3.8 mEq/L (ref 3.5–5.1)
SODIUM: 131 meq/L — AB (ref 136–145)
TOTAL PROTEIN: 6 g/dL — AB (ref 6.4–8.3)

## 2015-10-13 LAB — KAPPA/LAMBDA LIGHT CHAINS
KAPPA FREE LGHT CHN: 2.51 mg/dL — AB (ref 0.33–1.94)
Kappa:Lambda Ratio: 0.06 — ABNORMAL LOW (ref 0.26–1.65)
LAMBDA FREE LGHT CHN: 44.1 mg/dL — AB (ref 0.57–2.63)

## 2015-10-18 ENCOUNTER — Telehealth: Payer: Self-pay | Admitting: Oncology

## 2015-10-18 ENCOUNTER — Other Ambulatory Visit: Payer: Medicare Other

## 2015-10-18 ENCOUNTER — Ambulatory Visit (HOSPITAL_BASED_OUTPATIENT_CLINIC_OR_DEPARTMENT_OTHER): Payer: Medicare Other | Admitting: Oncology

## 2015-10-18 VITALS — BP 148/58 | HR 97 | Temp 98.5°F | Resp 18 | Ht 68.0 in | Wt 178.4 lb

## 2015-10-18 DIAGNOSIS — R1032 Left lower quadrant pain: Secondary | ICD-10-CM | POA: Diagnosis not present

## 2015-10-18 DIAGNOSIS — C9000 Multiple myeloma not having achieved remission: Secondary | ICD-10-CM | POA: Diagnosis not present

## 2015-10-18 DIAGNOSIS — C9002 Multiple myeloma in relapse: Secondary | ICD-10-CM

## 2015-10-18 NOTE — Telephone Encounter (Signed)
Gave patient avs report and appointments for December  °

## 2015-10-18 NOTE — Progress Notes (Signed)
[place] OFFICE PROGRESS NOTE   Diagnosis: Multiple myeloma  INTERVAL HISTORY:   [name] returns as scheduled. He feels well. He is now on maintenance Revlimid. He reports difficulty with balance at times. No falls. He has discomfort at the left groin in the morning. This goes away after ambulating. No other pain.  Objective:  Vital signs in last 24 hours:  Blood pressure 148/58, pulse 97, temperature 98.5 F (36.9 C), temperature source Oral, resp. rate 18, height 5' 8"  (1.727 m), weight 178 lb 6.4 oz (80.922 kg), SpO2 99 %.    HEENT: No thrush Resp: Lungs clear bilaterally Cardio: Regular rate and rhythm GI: No hepatosplenomegaly Vascular: No leg edema Neuro: Finger to nose testing is normal, he can walk heel to toe. Slightly wide-based gait  Musculoskeletal: No pain with motion at the left hip     Lab Results:  Lab Results  Component Value Date   WBC 2.9* 10/12/2015   HGB 11.4* 10/12/2015   HCT 33.2* 10/12/2015   MCV 93.3 10/12/2015   PLT 124* 10/12/2015   NEUTROABS 1.5 10/12/2015   Potassium 3.8, currently 1.1, calcium 9.5, bilirubin 1.04, lambda free light chains 44.1  Medications: I have reviewed the patient's current medications.  Assessment/Plan: 1. Multiple myeloma: The serum free light chains were elevated 08/29/2009. A CT of the chest 08/31/2009 showed a new pleural mass consistent with progression of multiple myeloma. He completed 12 cycles of Revlimid/Decadron. The serum free light chains were improved to 4.34 on 12/29/2009. A restaging CT of the chest 02/19/2010 showed resolution of right axillary lymphadenopathy and a pleural-based mass. The serum free lambda light chains were again elevated.  Salvage Revlimid/Decadron initiated 12/08/2014  Cycle 2 01/05/2015  Serum light chains improved 01/24/2015  Cycle 3 02/01/2015  Cycle 4 03/02/2015  Cycle 5 03/30/2015  Cycle 6 04/27/2015  Cycle 7 05/25/2015  Cycle 8  06/22/2015  Cycle 9 07/20/2015  Cycle 10 08/18/2015  Changed to maintenance Revlimid beginning 09/23/2015 2.History of back pain, likely related to the pleural-based mass at the right chest, resolved. 2. Right axillary/subpectoral lymphadenopathy on a CT of the chest 08/31/2009, likely related to multiple myeloma, resolved. 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 He is maintained on every three-month Zometa. 4. Pancytopenia secondary to Revlimid and multiple myeloma. He has persistent mild thrombocytopenia. 5. Hypertension. 6. Status post hip replacement. 7. History of back surgery. 8. History of trigeminal neuralgia. 9. Status post removal of a lipoma from the right axilla in February 2010. 10. 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 11. Esophageal reflux disease, followed by Dr. Janace Hoard.  12. Esophageal stricture, status post an evaluation by Dr. Henrene Pastor. 13. Right vocal cord lesion, status post a biopsy by Dr. 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 He completed radiation under the direction of Dr. Valere Dross on 03/15/2011. 14. Admission with pneumonia 03/26/2010. 15. History of Atrial flutter/fibrillation while hospitalized August 2011. He is followed by Dr. Percival Spanish. 16. Elevated prostate specific antigen, status post a biopsy 01/23/2011. He was found to have a Gleason 6 cancer in 10% of the cores. He is followed with an observation approach. He is now followed by Dr. Diona Fanti. 17. History of Mild hypercalcemia  18. Fall with a left wrist fracture June 2015  19. Right chest wall pain secondary to a right second rib plasmacytoma confirmed on a CT 11/28/2014, resolved 20. History of Hyponatremia-improved   Disposition:  He appears stable. We will obtain a x-ray  of the left hip if the pain becomes more consistent. The light chains were higher last week. He will continue maintenance Revlimid. At the light chains  are higher when he returns next month we will resume Revlimid/Decadron or switch to [facility] therapy.  He will return for an office visit and Zometa on 11/17/2015.  Betsy Coder, MD  10/18/2015  9:32 AM

## 2015-10-18 NOTE — Telephone Encounter (Signed)
zometa added to follow md visit per pof  anne

## 2015-10-19 ENCOUNTER — Other Ambulatory Visit: Payer: Self-pay | Admitting: *Deleted

## 2015-10-19 MED ORDER — LENALIDOMIDE 10 MG PO CAPS: 10.0000 mg | ORAL_CAPSULE | Freq: Every day | ORAL | Status: DC

## 2015-10-20 ENCOUNTER — Encounter: Payer: Self-pay | Admitting: Oncology

## 2015-10-20 NOTE — Progress Notes (Signed)
Per [facility] revlimid was shipped via fedex

## 2015-11-11 ENCOUNTER — Other Ambulatory Visit: Payer: Self-pay | Admitting: Oncology

## 2015-11-13 ENCOUNTER — Other Ambulatory Visit (HOSPITAL_BASED_OUTPATIENT_CLINIC_OR_DEPARTMENT_OTHER): Payer: Medicare Other

## 2015-11-13 DIAGNOSIS — C9002 Multiple myeloma in relapse: Secondary | ICD-10-CM | POA: Diagnosis not present

## 2015-11-13 LAB — CBC WITH DIFFERENTIAL/PLATELET
BASO%: 0 % (ref 0.0–2.0)
Basophils Absolute: 0 10*3/uL (ref 0.0–0.1)
EOS%: 1.4 % (ref 0.0–7.0)
Eosinophils Absolute: 0 10*3/uL (ref 0.0–0.5)
HCT: 28.4 % — ABNORMAL LOW (ref 38.4–49.9)
HGB: 9.6 g/dL — ABNORMAL LOW (ref 13.0–17.1)
LYMPH%: 21.4 % (ref 14.0–49.0)
MCH: 30.6 pg (ref 27.2–33.4)
MCHC: 33.8 g/dL (ref 32.0–36.0)
MCV: 90.4 fL (ref 79.3–98.0)
MONO#: 0.4 10*3/uL (ref 0.1–0.9)
MONO%: 14.3 % — AB (ref 0.0–14.0)
NEUT%: 62.9 % (ref 39.0–75.0)
NEUTROS ABS: 1.8 10*3/uL (ref 1.5–6.5)
Platelets: 148 10*3/uL (ref 140–400)
RBC: 3.14 10*6/uL — AB (ref 4.20–5.82)
RDW: 15.2 % — ABNORMAL HIGH (ref 11.0–14.6)
WBC: 2.8 10*3/uL — AB (ref 4.0–10.3)
lymph#: 0.6 10*3/uL — ABNORMAL LOW (ref 0.9–3.3)

## 2015-11-13 LAB — COMPREHENSIVE METABOLIC PANEL
ALT: 17 U/L (ref 0–55)
AST: 20 U/L (ref 5–34)
Albumin: 3.2 g/dL — ABNORMAL LOW (ref 3.5–5.0)
Alkaline Phosphatase: 58 U/L (ref 40–150)
Anion Gap: 9 mEq/L (ref 3–11)
BILIRUBIN TOTAL: 1.31 mg/dL — AB (ref 0.20–1.20)
BUN: 13.1 mg/dL (ref 7.0–26.0)
CO2: 26 meq/L (ref 22–29)
CREATININE: 1.2 mg/dL (ref 0.7–1.3)
Calcium: 10 mg/dL (ref 8.4–10.4)
Chloride: 96 mEq/L — ABNORMAL LOW (ref 98–109)
EGFR: 58 mL/min/{1.73_m2} — ABNORMAL LOW (ref >90)
GLUCOSE: 108 mg/dL (ref 70–140)
Potassium: 3.6 mEq/L (ref 3.5–5.1)
SODIUM: 132 meq/L — AB (ref 136–145)
TOTAL PROTEIN: 6.7 g/dL (ref 6.4–8.3)

## 2015-11-14 LAB — KAPPA/LAMBDA LIGHT CHAINS
Kappa free light chain: 2.46 mg/dL — ABNORMAL HIGH (ref 0.33–1.94)
Kappa:Lambda Ratio: 0.01 — ABNORMAL LOW (ref 0.26–1.65)
Lambda Free Lght Chn: 312 mg/dL — ABNORMAL HIGH (ref 0.57–2.63)

## 2015-11-17 ENCOUNTER — Telehealth: Payer: Self-pay | Admitting: Oncology

## 2015-11-17 ENCOUNTER — Other Ambulatory Visit: Payer: Self-pay | Admitting: *Deleted

## 2015-11-17 ENCOUNTER — Ambulatory Visit (HOSPITAL_BASED_OUTPATIENT_CLINIC_OR_DEPARTMENT_OTHER): Payer: Medicare Other

## 2015-11-17 ENCOUNTER — Ambulatory Visit (HOSPITAL_BASED_OUTPATIENT_CLINIC_OR_DEPARTMENT_OTHER): Payer: Medicare Other | Admitting: Oncology

## 2015-11-17 VITALS — BP 131/48 | HR 94 | Temp 98.7°F | Resp 18 | Ht 68.0 in | Wt 172.5 lb

## 2015-11-17 DIAGNOSIS — C9000 Multiple myeloma not having achieved remission: Secondary | ICD-10-CM

## 2015-11-17 DIAGNOSIS — R270 Ataxia, unspecified: Secondary | ICD-10-CM

## 2015-11-17 DIAGNOSIS — I10 Essential (primary) hypertension: Secondary | ICD-10-CM

## 2015-11-17 DIAGNOSIS — G622 Polyneuropathy due to other toxic agents: Secondary | ICD-10-CM

## 2015-11-17 DIAGNOSIS — C9002 Multiple myeloma in relapse: Secondary | ICD-10-CM

## 2015-11-17 MED ORDER — ACYCLOVIR 400 MG PO TABS: 400.0000 mg | ORAL_TABLET | Freq: Two times a day (BID) | ORAL | Status: DC

## 2015-11-17 MED ORDER — ZOLEDRONIC ACID 4 MG/100ML IV SOLN
4.0000 mg | Freq: Once | INTRAVENOUS | Status: AC
  Administered 2015-11-17: 4 mg via INTRAVENOUS
  Filled 2015-11-17: qty 100

## 2015-11-17 MED ORDER — DEXAMETHASONE 4 MG PO TABS: 20.0000 mg | ORAL_TABLET | ORAL | Status: DC

## 2015-11-17 NOTE — Patient Instructions (Signed)
[facility] Discharge Instructions for Patients Receiving Chemotherapy  Today you received the following chemotherapy agents Zometa.  To help prevent nausea and vomiting after your treatment, we encourage you to take your nausea medication as directed.  If you develop nausea and vomiting that is not controlled by your nausea medication, call the clinic.   BELOW ARE SYMPTOMS THAT SHOULD BE REPORTED IMMEDIATELY:  *FEVER GREATER THAN 100.5 F  *CHILLS WITH OR WITHOUT FEVER  NAUSEA AND VOMITING THAT IS NOT CONTROLLED WITH YOUR NAUSEA MEDICATION  *UNUSUAL SHORTNESS OF BREATH  *UNUSUAL BRUISING OR BLEEDING  TENDERNESS IN MOUTH AND THROAT WITH OR WITHOUT PRESENCE OF ULCERS  *URINARY PROBLEMS  *BOWEL PROBLEMS  UNUSUAL RASH Items with * indicate a potential emergency and should be followed up as soon as possible.  Feel free to call the clinic you have any questions or concerns. The clinic phone number is [phone].  Please show the CHEMO ALERT CARD at check-in to the Emergency Department and triage nurse.    

## 2015-11-17 NOTE — Addendum Note (Signed)
Addended by: Domenic Schwab on: 11/17/2015 10:46 AM   Modules accepted: Orders

## 2015-11-17 NOTE — Telephone Encounter (Signed)
Gave and printed appt sched and avs for pt; for DEC and Jan  °

## 2015-11-17 NOTE — Progress Notes (Signed)
[name] OFFICE PROGRESS NOTE   Diagnosis: Multiple myeloma  INTERVAL HISTORY:   Richard [name] returns as scheduled. He continues maintenance Revlimid. He reports having a "cold" for the past 10 days. No dyspnea or fever. His appetite has diminished. He has mild nausea. No pain. He continues to have ataxia.  Objective:  Vital signs in last 24 hours:  There were no vitals taken for this visit.    HEENT: No thrush Resp: Lungs clear bilaterally Cardio: Regular rate and rhythm GI: Nontender, no hepatosplenomegaly Vascular: No leg edema Neuro: Finger to nose testing is normal. The gait is slightly ataxic.       Lab Results:  Lab Results  Component Value Date   WBC 2.8* 11/13/2015   HGB 9.6* 11/13/2015   HCT 28.4* 11/13/2015   MCV 90.4 11/13/2015   PLT 148 11/13/2015   NEUTROABS 1.8 11/13/2015   Potassium 3.6, creatinine 1.2, calcium 10, albumin 3.2, bilirubin 1.31, lambda free light chains 312  Medications: I have reviewed the patient's current medications.  Assessment/Plan: 1. Multiple myeloma: The serum free light chains were elevated 08/29/2009. A CT of the chest 08/31/2009 showed a new pleural mass consistent with progression of multiple myeloma. He completed 12 cycles of Revlimid/Decadron. The serum free light chains were improved to 4.34 on 12/29/2009. A restaging CT of the chest 02/19/2010 showed resolution of right axillary lymphadenopathy and a pleural-based mass. The serum free lambda light chains were again elevated.  Salvage Revlimid/Decadron initiated 12/08/2014  Cycle 2 01/05/2015  Serum light chains improved 01/24/2015  Cycle 3 02/01/2015  Cycle 4 03/02/2015  Cycle 5 03/30/2015  Cycle 6 04/27/2015  Cycle 7 05/25/2015  Cycle 8 06/22/2015  Cycle 9 07/20/2015  Cycle 10 08/18/2015  Changed to maintenance Revlimid beginning 09/23/2015  Progressive anemia and Elevated serum free light chains 11/13/2015  Changed to weekly  Cytoxan/Velcade/Decadron beginning 11/21/2015 2.History of back pain, likely related to the pleural-based mass at the right chest, resolved. 2. Right axillary/subpectoral lymphadenopathy on a CT of the chest 08/31/2009, likely related to multiple myeloma, resolved. 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 He is maintained on every three-month Zometa. 4. Pancytopenia secondary to Revlimid and multiple myeloma. He has persistent mild thrombocytopenia. 5. Hypertension. 6. Status post hip replacement. 7. History of back surgery. 8. History of trigeminal neuralgia. 9. Status post removal of a lipoma from the right axilla in February 2010. 10. 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 11. Esophageal reflux disease, followed by Dr. Janace Hoard.  12. Esophageal stricture, status post an evaluation by Dr. Henrene Pastor. 13. Right vocal cord lesion, status post a biopsy by Dr. 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 He completed radiation under the direction of Dr. Valere Dross on 03/15/2011. 14. Admission with pneumonia 03/26/2010. 15. History of Atrial flutter/fibrillation while hospitalized August 2011. He is followed by Dr. Percival Spanish. 16. Elevated prostate specific antigen, status post a biopsy 01/23/2011. He was found to have a Gleason 6 cancer in 10% of the cores. He is followed with an observation approach. He is now followed by Dr. Diona Fanti. 17. History of Mild hypercalcemia  18. Fall with a left wrist fracture June 2015  19. Right chest wall pain secondary to a right second rib plasmacytoma confirmed on a CT 11/28/2014, resolved 20. History of Hyponatremia-improved   Disposition:  Richard [name] has progressive myeloma. He will discontinue Revlimid. I recommend switching to Cytoxan/Velcade/Decadron. We reviewed the potential toxicities associated with this regimen including the chance  for hematologic toxicity, nausea, mucositis, and  diarrhea. We discussed the neuropathy associated with Velcade. He agrees to proceed.  Richard [name] will return for Velcade and Zometa on 11/21/2015. He will be scheduled for an office visit on 12/12/2015.    Betsy Coder, MD  11/17/2015  9:00 AM

## 2015-11-19 ENCOUNTER — Other Ambulatory Visit: Payer: Self-pay | Admitting: Oncology

## 2015-11-20 ENCOUNTER — Encounter: Payer: Self-pay | Admitting: Pharmacist

## 2015-11-20 ENCOUNTER — Other Ambulatory Visit: Payer: Self-pay | Admitting: Pharmacist

## 2015-11-20 ENCOUNTER — Other Ambulatory Visit: Payer: Self-pay | Admitting: Oncology

## 2015-11-20 MED ORDER — CYCLOPHOSPHAMIDE 50 MG PO TABS: 500.0000 mg | ORAL_TABLET | ORAL | Status: DC

## 2015-11-20 NOTE — Progress Notes (Signed)
12/12 - Oral Rx for Cyclophosphamide approved under patient's [employer] Part B (not Part D). HMO insurance plan of patient's requires in-network pharmacy. Rx sent to [facility] in [place] ([place]).

## 2015-11-21 ENCOUNTER — Other Ambulatory Visit (HOSPITAL_BASED_OUTPATIENT_CLINIC_OR_DEPARTMENT_OTHER): Payer: Medicare Other

## 2015-11-21 ENCOUNTER — Telehealth: Payer: Self-pay | Admitting: *Deleted

## 2015-11-21 ENCOUNTER — Other Ambulatory Visit: Payer: Self-pay | Admitting: *Deleted

## 2015-11-21 ENCOUNTER — Ambulatory Visit (HOSPITAL_BASED_OUTPATIENT_CLINIC_OR_DEPARTMENT_OTHER): Payer: Medicare Other

## 2015-11-21 ENCOUNTER — Other Ambulatory Visit: Payer: Medicare Other

## 2015-11-21 VITALS — BP 134/53 | HR 85 | Temp 98.5°F | Resp 18

## 2015-11-21 DIAGNOSIS — Z5112 Encounter for antineoplastic immunotherapy: Secondary | ICD-10-CM | POA: Diagnosis not present

## 2015-11-21 DIAGNOSIS — C9002 Multiple myeloma in relapse: Secondary | ICD-10-CM

## 2015-11-21 DIAGNOSIS — C9000 Multiple myeloma not having achieved remission: Secondary | ICD-10-CM

## 2015-11-21 LAB — COMPREHENSIVE METABOLIC PANEL
ALT: 31 U/L (ref 0–55)
ANION GAP: 10 meq/L (ref 3–11)
AST: 25 U/L (ref 5–34)
Albumin: 3 g/dL — ABNORMAL LOW (ref 3.5–5.0)
Alkaline Phosphatase: 64 U/L (ref 40–150)
BUN: 13.6 mg/dL (ref 7.0–26.0)
CALCIUM: 9.5 mg/dL (ref 8.4–10.4)
CHLORIDE: 98 meq/L (ref 98–109)
CO2: 24 mEq/L (ref 22–29)
Creatinine: 1.2 mg/dL (ref 0.7–1.3)
EGFR: 59 mL/min/{1.73_m2} — ABNORMAL LOW (ref >90)
Glucose: 114 mg/dl (ref 70–140)
POTASSIUM: 3.5 meq/L (ref 3.5–5.1)
Sodium: 132 mEq/L — ABNORMAL LOW (ref 136–145)
Total Bilirubin: 0.7 mg/dL (ref 0.20–1.20)
Total Protein: 6.6 g/dL (ref 6.4–8.3)

## 2015-11-21 LAB — CBC WITH DIFFERENTIAL/PLATELET
BASO%: 0.3 % (ref 0.0–2.0)
BASOS ABS: 0 10*3/uL (ref 0.0–0.1)
EOS%: 1 % (ref 0.0–7.0)
Eosinophils Absolute: 0 10*3/uL (ref 0.0–0.5)
HEMATOCRIT: 26 % — AB (ref 38.4–49.9)
HGB: 8.7 g/dL — ABNORMAL LOW (ref 13.0–17.1)
LYMPH#: 1 10*3/uL (ref 0.9–3.3)
LYMPH%: 31.6 % (ref 14.0–49.0)
MCH: 30.1 pg (ref 27.2–33.4)
MCHC: 33.5 g/dL (ref 32.0–36.0)
MCV: 90 fL (ref 79.3–98.0)
MONO#: 0.5 10*3/uL (ref 0.1–0.9)
MONO%: 15 % — ABNORMAL HIGH (ref 0.0–14.0)
NEUT#: 1.6 10*3/uL (ref 1.5–6.5)
NEUT%: 52.1 % (ref 39.0–75.0)
PLATELETS: 174 10*3/uL (ref 140–400)
RBC: 2.89 10*6/uL — ABNORMAL LOW (ref 4.20–5.82)
RDW: 15.5 % — ABNORMAL HIGH (ref 11.0–14.6)
WBC: 3.1 10*3/uL — ABNORMAL LOW (ref 4.0–10.3)

## 2015-11-21 MED ORDER — ONDANSETRON HCL 8 MG PO TABS
8.0000 mg | ORAL_TABLET | Freq: Once | ORAL | Status: AC
  Administered 2015-11-21: 8 mg via ORAL

## 2015-11-21 MED ORDER — ONDANSETRON HCL 8 MG PO TABS
ORAL_TABLET | ORAL | Status: AC
  Filled 2015-11-21: qty 1

## 2015-11-21 MED ORDER — CYCLOPHOSPHAMIDE 50 MG PO TABS: 500.0000 mg | ORAL_TABLET | ORAL | Status: DC

## 2015-11-21 MED ORDER — BORTEZOMIB CHEMO SQ INJECTION 3.5 MG (2.5MG/ML)
1.3000 mg/m2 | Freq: Once | INTRAMUSCULAR | Status: AC
  Administered 2015-11-21: 2.5 mg via SUBCUTANEOUS
  Filled 2015-11-21: qty 2.5

## 2015-11-21 MED ORDER — ONDANSETRON HCL 8 MG PO TABS: 8.0000 mg | ORAL_TABLET | Freq: Three times a day (TID) | ORAL | Status: DC | PRN

## 2015-11-21 NOTE — Progress Notes (Signed)
Clarified orders with Dr, Benay Spice, pt received Zometa 11/17/15, pt is to receive Velcade only today.  Pt tolerated treatment well and monitored 30 minutes post chemo injection. Pt and VS stable at time of discharge.

## 2015-11-21 NOTE — Telephone Encounter (Addendum)
Message from pt's daughter reporting she was NOT able to pick up Cytoxan today. There are no patient assistance funds for this drug. Per Dr. Benay Spice: Will change to IV Cytoxan with next treatment. Melinda aware.

## 2015-11-21 NOTE — Telephone Encounter (Signed)
Message from pt's daughter reporting Cytoxan is not covered by his insurance. It'll cost about $500 per month. He can not afford this. Returned call to [place], she reports she called [employer] and was given info for "[employer]" pt assistance foundation and she was able to pick up Cytoxan for this month. She is looking into other medication assistance programs.

## 2015-11-21 NOTE — Telephone Encounter (Signed)
Notified by pt's daughter [employer] unable to fill Cytoxan without diagnosis written on Rx. Rx sent electronically with diagnosis code.

## 2015-11-21 NOTE — Patient Instructions (Signed)
[facility] Discharge Instructions for Patients Receiving Chemotherapy  Today you received the following chemotherapy agents Velcade  To help prevent nausea and vomiting after your treatment, we encourage you to take your nausea medication as directed.   If you develop nausea and vomiting that is not controlled by your nausea medication, call the clinic.   BELOW ARE SYMPTOMS THAT SHOULD BE REPORTED IMMEDIATELY:  *FEVER GREATER THAN 100.5 F  *CHILLS WITH OR WITHOUT FEVER  NAUSEA AND VOMITING THAT IS NOT CONTROLLED WITH YOUR NAUSEA MEDICATION  *UNUSUAL SHORTNESS OF BREATH  *UNUSUAL BRUISING OR BLEEDING  TENDERNESS IN MOUTH AND THROAT WITH OR WITHOUT PRESENCE OF ULCERS  *URINARY PROBLEMS  *BOWEL PROBLEMS  UNUSUAL RASH Items with * indicate a potential emergency and should be followed up as soon as possible.  Feel free to call the clinic you have any questions or concerns. The clinic phone number is [phone].  Please show the CHEMO ALERT CARD at check-in to the Emergency Department and triage nurse.     Bortezomib injection What is this medicine? BORTEZOMIB (bor TEZ oh mib) is a medicine that targets proteins in cancer cells and stops the cancer cells from growing. It is used to treat multiple myeloma and mantle-cell lymphoma. This medicine may be used for other purposes; ask your health care provider or pharmacist if you have questions. What should I tell my health care provider before I take this medicine? They need to know if you have any of these conditions: -diabetes -heart disease -irregular heartbeat -liver disease -on hemodialysis -low blood counts, like low white blood cells, platelets, or hemoglobin -peripheral neuropathy -taking medicine for blood pressure -an unusual or allergic reaction to bortezomib, mannitol, boron, other medicines, foods, dyes, or preservatives -pregnant or trying to get pregnant -breast-feeding How should I use  this medicine? This medicine is for injection into a vein or for injection under the skin. It is given by a health care professional in a hospital or clinic setting. Talk to your pediatrician regarding the use of this medicine in children. Special care may be needed. Overdosage: If you think you have taken too much of this medicine contact a poison control center or emergency room at once. NOTE: This medicine is only for you. Do not share this medicine with others. What if I miss a dose? It is important not to miss your dose. Call your doctor or health care professional if you are unable to keep an appointment. What may interact with this medicine? This medicine may interact with the following medications: -ketoconazole -rifampin -ritonavir -St. John's Wort This list may not describe all possible interactions. Give your health care provider a list of all the medicines, herbs, non-prescription drugs, or dietary supplements you use. Also tell them if you smoke, drink alcohol, or use illegal drugs. Some items may interact with your medicine. What should I watch for while using this medicine? Visit your doctor for checks on your progress. This drug may make you feel generally unwell. This is not uncommon, as chemotherapy can affect healthy cells as well as cancer cells. Report any side effects. Continue your course of treatment even though you feel ill unless your doctor tells you to stop. You may get drowsy or dizzy. Do not drive, use machinery, or do anything that needs mental alertness until you know how this medicine affects you. Do not stand or sit up quickly, especially if you are an older patient. This reduces the risk of dizzy or fainting   spells. In some cases, you may be given additional medicines to help with side effects. Follow all directions for their use. Call your doctor or health care professional for advice if you get a fever, chills or sore throat, or other symptoms of a cold or flu.  Do not treat yourself. This drug decreases your body's ability to fight infections. Try to avoid being around people who are sick. This medicine may increase your risk to bruise or bleed. Call your doctor or health care professional if you notice any unusual bleeding. You may need blood work done while you are taking this medicine. In some patients, this medicine may cause a serious brain infection that may cause death. If you have any problems seeing, thinking, speaking, walking, or standing, tell your doctor right away. If you cannot reach your doctor, urgently seek other source of medical care. Do not become pregnant while taking this medicine. Women should inform their doctor if they wish to become pregnant or think they might be pregnant. There is a potential for serious side effects to an unborn child. Talk to your health care professional or pharmacist for more information. Do not breast-feed an infant while taking this medicine. Check with your doctor or health care professional if you get an attack of severe diarrhea, nausea and vomiting, or if you sweat a lot. The loss of too much body fluid can make it dangerous for you to take this medicine. What side effects may I notice from receiving this medicine? Side effects that you should report to your doctor or health care professional as soon as possible: -allergic reactions like skin rash, itching or hives, swelling of the face, lips, or tongue -breathing problems -changes in hearing -changes in vision -fast, irregular heartbeat -feeling faint or lightheaded, falls -pain, tingling, numbness in the hands or feet -right upper belly pain -seizures -swelling of the ankles, feet, hands -unusual bleeding or bruising -unusually weak or tired -vomiting -yellowing of the eyes or skin Side effects that usually do not require medical attention (report to your doctor or health care professional if they continue or are bothersome): -changes in  emotions or moods -constipation -diarrhea -loss of appetite -headache -irritation at site where injected -nausea This list may not describe all possible side effects. Call your doctor for medical advice about side effects. You may report side effects to FDA at 1-800-FDA-1088. Where should I keep my medicine? This drug is given in a hospital or clinic and will not be stored at home. NOTE: This sheet is a summary. It may not cover all possible information. If you have questions about this medicine, talk to your doctor, pharmacist, or health care provider.    2016, Elsevier/Gold Standard. (2015-01-24 14:47:04)   

## 2015-11-22 ENCOUNTER — Other Ambulatory Visit: Payer: Self-pay | Admitting: Oncology

## 2015-11-22 ENCOUNTER — Other Ambulatory Visit: Payer: Self-pay | Admitting: *Deleted

## 2015-11-22 DIAGNOSIS — C9000 Multiple myeloma not having achieved remission: Secondary | ICD-10-CM

## 2015-11-22 DIAGNOSIS — C9002 Multiple myeloma in relapse: Secondary | ICD-10-CM

## 2015-11-22 NOTE — Addendum Note (Signed)
Addended by: Brien Few on: 11/22/2015 09:25 AM   Modules accepted: Orders

## 2015-11-28 ENCOUNTER — Other Ambulatory Visit: Payer: Self-pay | Admitting: Oncology

## 2015-11-28 ENCOUNTER — Other Ambulatory Visit: Payer: Self-pay | Admitting: Physician Assistant

## 2015-11-28 ENCOUNTER — Ambulatory Visit (HOSPITAL_BASED_OUTPATIENT_CLINIC_OR_DEPARTMENT_OTHER): Payer: Medicare Other

## 2015-11-28 ENCOUNTER — Telehealth: Payer: Self-pay | Admitting: Oncology

## 2015-11-28 ENCOUNTER — Other Ambulatory Visit (HOSPITAL_BASED_OUTPATIENT_CLINIC_OR_DEPARTMENT_OTHER): Payer: Medicare Other

## 2015-11-28 VITALS — BP 127/56 | HR 84 | Temp 98.2°F | Resp 18

## 2015-11-28 DIAGNOSIS — Z5111 Encounter for antineoplastic chemotherapy: Secondary | ICD-10-CM | POA: Diagnosis not present

## 2015-11-28 DIAGNOSIS — C9000 Multiple myeloma not having achieved remission: Secondary | ICD-10-CM

## 2015-11-28 DIAGNOSIS — C9002 Multiple myeloma in relapse: Secondary | ICD-10-CM

## 2015-11-28 DIAGNOSIS — Z5112 Encounter for antineoplastic immunotherapy: Secondary | ICD-10-CM | POA: Diagnosis not present

## 2015-11-28 LAB — HOLD TUBE, BLOOD BANK

## 2015-11-28 LAB — CBC WITH DIFFERENTIAL/PLATELET
BASO%: 0.3 % (ref 0.0–2.0)
Basophils Absolute: 0 10*3/uL (ref 0.0–0.1)
EOS ABS: 0 10*3/uL (ref 0.0–0.5)
EOS%: 1.2 % (ref 0.0–7.0)
HCT: 26.9 % — ABNORMAL LOW (ref 38.4–49.9)
HEMOGLOBIN: 8.8 g/dL — AB (ref 13.0–17.1)
LYMPH%: 30 % (ref 14.0–49.0)
MCH: 29.6 pg (ref 27.2–33.4)
MCHC: 32.9 g/dL (ref 32.0–36.0)
MCV: 90.1 fL (ref 79.3–98.0)
MONO#: 0.7 10*3/uL (ref 0.1–0.9)
MONO%: 17.1 % — AB (ref 0.0–14.0)
NEUT#: 2 10*3/uL (ref 1.5–6.5)
NEUT%: 51.4 % (ref 39.0–75.0)
Platelets: 136 10*3/uL — ABNORMAL LOW (ref 140–400)
RBC: 2.98 10*6/uL — ABNORMAL LOW (ref 4.20–5.82)
RDW: 17.1 % — ABNORMAL HIGH (ref 11.0–14.6)
WBC: 3.9 10*3/uL — ABNORMAL LOW (ref 4.0–10.3)
lymph#: 1.2 10*3/uL (ref 0.9–3.3)

## 2015-11-28 LAB — COMPREHENSIVE METABOLIC PANEL
ALBUMIN: 3.2 g/dL — AB (ref 3.5–5.0)
ALK PHOS: 76 U/L (ref 40–150)
ALT: 21 U/L (ref 0–55)
AST: 21 U/L (ref 5–34)
Anion Gap: 8 mEq/L (ref 3–11)
BUN: 12.1 mg/dL (ref 7.0–26.0)
CO2: 26 mEq/L (ref 22–29)
Calcium: 10.8 mg/dL — ABNORMAL HIGH (ref 8.4–10.4)
Chloride: 96 mEq/L — ABNORMAL LOW (ref 98–109)
Creatinine: 1.3 mg/dL (ref 0.7–1.3)
EGFR: 51 mL/min/{1.73_m2} — AB (ref >90)
GLUCOSE: 107 mg/dL (ref 70–140)
POTASSIUM: 3.9 meq/L (ref 3.5–5.1)
SODIUM: 131 meq/L — AB (ref 136–145)
Total Bilirubin: 0.9 mg/dL (ref 0.20–1.20)
Total Protein: 7 g/dL (ref 6.4–8.3)

## 2015-11-28 LAB — TECHNOLOGIST REVIEW

## 2015-11-28 MED ORDER — ONDANSETRON HCL 8 MG PO TABS
8.0000 mg | ORAL_TABLET | Freq: Once | ORAL | Status: AC
  Administered 2015-11-28: 8 mg via ORAL

## 2015-11-28 MED ORDER — ONDANSETRON HCL 8 MG PO TABS
ORAL_TABLET | ORAL | Status: AC
Start: 2015-11-28 — End: 2015-11-28
  Filled 2015-11-28: qty 1

## 2015-11-28 MED ORDER — CYCLOPHOSPHAMIDE CHEMO INJECTION 1 GM
260.0000 mg/m2 | Freq: Once | INTRAMUSCULAR | Status: AC
  Administered 2015-11-28: 500 mg via INTRAVENOUS
  Filled 2015-11-28: qty 25

## 2015-11-28 MED ORDER — BORTEZOMIB CHEMO SQ INJECTION 3.5 MG (2.5MG/ML)
1.3000 mg/m2 | Freq: Once | INTRAMUSCULAR | Status: AC
  Administered 2015-11-28: 2.5 mg via SUBCUTANEOUS
  Filled 2015-11-28: qty 2.5

## 2015-11-28 NOTE — Telephone Encounter (Signed)
PER 12/20 POF MOVED 1/3 F/U FROM SW TO LT. MOVED APPOINTMENT AND ADJUSTED OTHER APPOINTMENTS. SPOKE WITH DTR RE CHANGE - PATIENT IN INF AND WILL GET NEW SCHEDULE PRIOR TO LEAVING.

## 2015-11-28 NOTE — Patient Instructions (Signed)
[facility] Discharge Instructions for Patients Receiving Chemotherapy  Today you received the following chemotherapy agents Cytoxan and Velcade.  To help prevent nausea and vomiting after your treatment, we encourage you to take your nausea medication.   If you develop nausea and vomiting that is not controlled by your nausea medication, call the clinic.   BELOW ARE SYMPTOMS THAT SHOULD BE REPORTED IMMEDIATELY:  *FEVER GREATER THAN 100.5 F  *CHILLS WITH OR WITHOUT FEVER  NAUSEA AND VOMITING THAT IS NOT CONTROLLED WITH YOUR NAUSEA MEDICATION  *UNUSUAL SHORTNESS OF BREATH  *UNUSUAL BRUISING OR BLEEDING  TENDERNESS IN MOUTH AND THROAT WITH OR WITHOUT PRESENCE OF ULCERS  *URINARY PROBLEMS  *BOWEL PROBLEMS  UNUSUAL RASH Items with * indicate a potential emergency and should be followed up as soon as possible.  Feel free to call the clinic you have any questions or concerns. The clinic phone number is [phone].  Please show the CHEMO ALERT CARD at check-in to the Emergency Department and triage nurse.   

## 2015-12-04 ENCOUNTER — Other Ambulatory Visit: Payer: Self-pay | Admitting: Oncology

## 2015-12-05 ENCOUNTER — Other Ambulatory Visit: Payer: Self-pay | Admitting: Oncology

## 2015-12-05 ENCOUNTER — Other Ambulatory Visit (HOSPITAL_BASED_OUTPATIENT_CLINIC_OR_DEPARTMENT_OTHER): Payer: Medicare Other

## 2015-12-05 ENCOUNTER — Other Ambulatory Visit: Payer: Medicare Other

## 2015-12-05 ENCOUNTER — Ambulatory Visit (HOSPITAL_BASED_OUTPATIENT_CLINIC_OR_DEPARTMENT_OTHER): Payer: Medicare Other

## 2015-12-05 VITALS — BP 134/57 | HR 85 | Temp 98.5°F | Resp 20

## 2015-12-05 DIAGNOSIS — C9002 Multiple myeloma in relapse: Secondary | ICD-10-CM

## 2015-12-05 DIAGNOSIS — Z5112 Encounter for antineoplastic immunotherapy: Secondary | ICD-10-CM

## 2015-12-05 LAB — COMPREHENSIVE METABOLIC PANEL
ALBUMIN: 3.1 g/dL — AB (ref 3.5–5.0)
ALT: 17 U/L (ref 0–55)
ANION GAP: 8 meq/L (ref 3–11)
AST: 18 U/L (ref 5–34)
Alkaline Phosphatase: 76 U/L (ref 40–150)
BILIRUBIN TOTAL: 0.51 mg/dL (ref 0.20–1.20)
BUN: 12.9 mg/dL (ref 7.0–26.0)
CALCIUM: 10.3 mg/dL (ref 8.4–10.4)
CHLORIDE: 98 meq/L (ref 98–109)
CO2: 25 mEq/L (ref 22–29)
CREATININE: 1.1 mg/dL (ref 0.7–1.3)
EGFR: 61 mL/min/{1.73_m2} — ABNORMAL LOW (ref >90)
Glucose: 128 mg/dl (ref 70–140)
Potassium: 4 mEq/L (ref 3.5–5.1)
Sodium: 131 mEq/L — ABNORMAL LOW (ref 136–145)
TOTAL PROTEIN: 6.8 g/dL (ref 6.4–8.3)

## 2015-12-05 LAB — CBC WITH DIFFERENTIAL/PLATELET
BASO%: 0.2 % (ref 0.0–2.0)
Basophils Absolute: 0 10*3/uL (ref 0.0–0.1)
EOS%: 1 % (ref 0.0–7.0)
Eosinophils Absolute: 0 10*3/uL (ref 0.0–0.5)
HEMATOCRIT: 25.8 % — AB (ref 38.4–49.9)
HEMOGLOBIN: 8.7 g/dL — AB (ref 13.0–17.1)
LYMPH#: 0.9 10*3/uL (ref 0.9–3.3)
LYMPH%: 23.7 % (ref 14.0–49.0)
MCH: 30.4 pg (ref 27.2–33.4)
MCHC: 33.7 g/dL (ref 32.0–36.0)
MCV: 90.3 fL (ref 79.3–98.0)
MONO#: 0.4 10*3/uL (ref 0.1–0.9)
MONO%: 9.9 % (ref 0.0–14.0)
NEUT%: 65.2 % (ref 39.0–75.0)
NEUTROS ABS: 2.6 10*3/uL (ref 1.5–6.5)
PLATELETS: 90 10*3/uL — AB (ref 140–400)
RBC: 2.85 10*6/uL — ABNORMAL LOW (ref 4.20–5.82)
RDW: 17 % — AB (ref 11.0–14.6)
WBC: 3.9 10*3/uL — AB (ref 4.0–10.3)

## 2015-12-05 MED ORDER — ONDANSETRON HCL 8 MG PO TABS
ORAL_TABLET | ORAL | Status: AC
  Filled 2015-12-05: qty 1

## 2015-12-05 MED ORDER — BORTEZOMIB CHEMO SQ INJECTION 3.5 MG (2.5MG/ML)
1.3000 mg/m2 | Freq: Once | INTRAMUSCULAR | Status: AC
  Administered 2015-12-05: 2.5 mg via SUBCUTANEOUS
  Filled 2015-12-05: qty 2.5

## 2015-12-05 MED ORDER — ONDANSETRON HCL 8 MG PO TABS
8.0000 mg | ORAL_TABLET | Freq: Once | ORAL | Status: AC
  Administered 2015-12-05: 8 mg via ORAL

## 2015-12-05 NOTE — Patient Instructions (Signed)
Bortezomib injection What is this medicine? BORTEZOMIB (bor TEZ oh mib) is a medicine that targets proteins in cancer cells and stops the cancer cells from growing. It is used to treat multiple myeloma and mantle-cell lymphoma. This medicine may be used for other purposes; ask your health care provider or pharmacist if you have questions. What should I tell my health care provider before I take this medicine? They need to know if you have any of these conditions: -diabetes -heart disease -irregular heartbeat -liver disease -on hemodialysis -low blood counts, like low white blood cells, platelets, or hemoglobin -peripheral neuropathy -taking medicine for blood pressure -an unusual or allergic reaction to bortezomib, mannitol, boron, other medicines, foods, dyes, or preservatives -pregnant or trying to get pregnant -breast-feeding How should I use this medicine? This medicine is for injection into a vein or for injection under the skin. It is given by a health care professional in a hospital or clinic setting. Talk to your pediatrician regarding the use of this medicine in children. Special care may be needed. Overdosage: If you think you have taken too much of this medicine contact a poison control center or emergency room at once. NOTE: This medicine is only for you. Do not share this medicine with others. What if I miss a dose? It is important not to miss your dose. Call your doctor or health care professional if you are unable to keep an appointment. What may interact with this medicine? This medicine may interact with the following medications: -ketoconazole -rifampin -ritonavir -St. Donyale Falcon's Wort This list may not describe all possible interactions. Give your health care provider a list of all the medicines, herbs, non-prescription drugs, or dietary supplements you use. Also tell them if you smoke, drink alcohol, or use illegal drugs. Some items may interact with your medicine. What  should I watch for while using this medicine? Visit your doctor for checks on your progress. This drug may make you feel generally unwell. This is not uncommon, as chemotherapy can affect healthy cells as well as cancer cells. Report any side effects. Continue your course of treatment even though you feel ill unless your doctor tells you to stop. You may get drowsy or dizzy. Do not drive, use machinery, or do anything that needs mental alertness until you know how this medicine affects you. Do not stand or sit up quickly, especially if you are an older patient. This reduces the risk of dizzy or fainting spells. In some cases, you may be given additional medicines to help with side effects. Follow all directions for their use. Call your doctor or health care professional for advice if you get a fever, chills or sore throat, or other symptoms of a cold or flu. Do not treat yourself. This drug decreases your body's ability to fight infections. Try to avoid being around people who are sick. This medicine may increase your risk to bruise or bleed. Call your doctor or health care professional if you notice any unusual bleeding. You may need blood work done while you are taking this medicine. In some patients, this medicine may cause a serious brain infection that may cause death. If you have any problems seeing, thinking, speaking, walking, or standing, tell your doctor right away. If you cannot reach your doctor, urgently seek other source of medical care. Do not become pregnant while taking this medicine. Women should inform their doctor if they wish to become pregnant or think they might be pregnant. There is a potential for serious  side effects to an unborn child. Talk to your health care professional or pharmacist for more information. Do not breast-feed an infant while taking this medicine. Check with your doctor or health care professional if you get an attack of severe diarrhea, nausea and vomiting, or if  you sweat a lot. The loss of too much body fluid can make it dangerous for you to take this medicine. What side effects may I notice from receiving this medicine? Side effects that you should report to your doctor or health care professional as soon as possible: -allergic reactions like skin rash, itching or hives, swelling of the face, lips, or tongue -breathing problems -changes in hearing -changes in vision -fast, irregular heartbeat -feeling faint or lightheaded, falls -pain, tingling, numbness in the hands or feet -right upper belly pain -seizures -swelling of the ankles, feet, hands -unusual bleeding or bruising -unusually weak or tired -vomiting -yellowing of the eyes or skin Side effects that usually do not require medical attention (report to your doctor or health care professional if they continue or are bothersome): -changes in emotions or moods -constipation -diarrhea -loss of appetite -headache -irritation at site where injected -nausea This list may not describe all possible side effects. Call your doctor for medical advice about side effects. You may report side effects to FDA at 1-800-FDA-1088. Where should I keep my medicine? This drug is given in a hospital or clinic and will not be stored at home. NOTE: This sheet is a summary. It may not cover all possible information. If you have questions about this medicine, talk to your doctor, pharmacist, or health care provider.    2016, Elsevier/Gold Standard. (2015-01-24 14:47:04)

## 2015-12-05 NOTE — Progress Notes (Signed)
Verbal consent given by Dr. Benay Spice to treat today with  plalets of 90

## 2015-12-11 ENCOUNTER — Other Ambulatory Visit: Payer: Self-pay | Admitting: Oncology

## 2015-12-12 ENCOUNTER — Telehealth: Payer: Self-pay | Admitting: Nurse Practitioner

## 2015-12-12 ENCOUNTER — Ambulatory Visit (HOSPITAL_BASED_OUTPATIENT_CLINIC_OR_DEPARTMENT_OTHER): Payer: Medicare Other

## 2015-12-12 ENCOUNTER — Ambulatory Visit (HOSPITAL_BASED_OUTPATIENT_CLINIC_OR_DEPARTMENT_OTHER): Payer: Medicare Other | Admitting: Nurse Practitioner

## 2015-12-12 ENCOUNTER — Other Ambulatory Visit (HOSPITAL_BASED_OUTPATIENT_CLINIC_OR_DEPARTMENT_OTHER): Payer: Medicare Other

## 2015-12-12 VITALS — BP 139/64 | HR 86 | Temp 98.9°F | Resp 18 | Ht 68.0 in | Wt 171.9 lb

## 2015-12-12 DIAGNOSIS — I10 Essential (primary) hypertension: Secondary | ICD-10-CM | POA: Diagnosis not present

## 2015-12-12 DIAGNOSIS — D649 Anemia, unspecified: Secondary | ICD-10-CM | POA: Diagnosis not present

## 2015-12-12 DIAGNOSIS — C9000 Multiple myeloma not having achieved remission: Secondary | ICD-10-CM

## 2015-12-12 DIAGNOSIS — Z5112 Encounter for antineoplastic immunotherapy: Secondary | ICD-10-CM | POA: Diagnosis not present

## 2015-12-12 DIAGNOSIS — C9002 Multiple myeloma in relapse: Secondary | ICD-10-CM

## 2015-12-12 DIAGNOSIS — D801 Nonfamilial hypogammaglobulinemia: Secondary | ICD-10-CM

## 2015-12-12 LAB — CBC WITH DIFFERENTIAL/PLATELET
BASO%: 0.1 % (ref 0.0–2.0)
Basophils Absolute: 0 10*3/uL (ref 0.0–0.1)
EOS ABS: 0 10*3/uL (ref 0.0–0.5)
EOS%: 1 % (ref 0.0–7.0)
HCT: 27.9 % — ABNORMAL LOW (ref 38.4–49.9)
HEMOGLOBIN: 9.4 g/dL — AB (ref 13.0–17.1)
LYMPH%: 21.6 % (ref 14.0–49.0)
MCH: 30.4 pg (ref 27.2–33.4)
MCHC: 33.6 g/dL (ref 32.0–36.0)
MCV: 90.5 fL (ref 79.3–98.0)
MONO#: 0.4 10*3/uL (ref 0.1–0.9)
MONO%: 11.4 % (ref 0.0–14.0)
NEUT%: 65.9 % (ref 39.0–75.0)
NEUTROS ABS: 2 10*3/uL (ref 1.5–6.5)
PLATELETS: 104 10*3/uL — AB (ref 140–400)
RBC: 3.09 10*6/uL — ABNORMAL LOW (ref 4.20–5.82)
RDW: 18 % — AB (ref 11.0–14.6)
WBC: 3.1 10*3/uL — AB (ref 4.0–10.3)
lymph#: 0.7 10*3/uL — ABNORMAL LOW (ref 0.9–3.3)

## 2015-12-12 LAB — COMPREHENSIVE METABOLIC PANEL
ALT: 16 U/L (ref 0–55)
ANION GAP: 9 meq/L (ref 3–11)
AST: 18 U/L (ref 5–34)
Albumin: 3.4 g/dL — ABNORMAL LOW (ref 3.5–5.0)
Alkaline Phosphatase: 73 U/L (ref 40–150)
BILIRUBIN TOTAL: 0.63 mg/dL (ref 0.20–1.20)
BUN: 14.3 mg/dL (ref 7.0–26.0)
CO2: 27 meq/L (ref 22–29)
Calcium: 10.2 mg/dL (ref 8.4–10.4)
Chloride: 95 mEq/L — ABNORMAL LOW (ref 98–109)
Creatinine: 1.2 mg/dL (ref 0.7–1.3)
EGFR: 59 mL/min/{1.73_m2} — AB (ref >90)
Glucose: 108 mg/dl (ref 70–140)
POTASSIUM: 4 meq/L (ref 3.5–5.1)
SODIUM: 130 meq/L — AB (ref 136–145)
TOTAL PROTEIN: 6.4 g/dL (ref 6.4–8.3)

## 2015-12-12 MED ORDER — ONDANSETRON HCL 8 MG PO TABS
ORAL_TABLET | ORAL | Status: AC
  Filled 2015-12-12: qty 1

## 2015-12-12 MED ORDER — ONDANSETRON HCL 8 MG PO TABS
8.0000 mg | ORAL_TABLET | Freq: Once | ORAL | Status: AC
  Administered 2015-12-12: 8 mg via ORAL

## 2015-12-12 MED ORDER — VALACYCLOVIR HCL 500 MG PO TABS: 500.0000 mg | ORAL_TABLET | Freq: Every day | ORAL | Status: DC

## 2015-12-12 MED ORDER — BORTEZOMIB CHEMO SQ INJECTION 3.5 MG (2.5MG/ML)
1.3000 mg/m2 | Freq: Once | INTRAMUSCULAR | Status: AC
  Administered 2015-12-12: 2.5 mg via SUBCUTANEOUS
  Filled 2015-12-12: qty 2.5

## 2015-12-12 NOTE — Progress Notes (Signed)
[facility] OFFICE PROGRESS NOTE   Diagnosis:   Multiple myeloma  INTERVAL HISTORY:   [name] returns as scheduled. He began weekly Cytoxan/Velcade/ Decadron 11/21/2015. He denies nausea/vomiting. No mouth sores. No diarrhea. He denies pain. He notes increased daytime sleepiness. He and his daughter think this may be related to the acyclovir.  Objective:  Vital signs in last 24 hours:  Blood pressure 139/64, pulse 86, temperature 98.9 F (37.2 C), temperature source Oral, resp. rate 18, height 5' 8"  (1.727 m), weight 171 lb 14.4 oz (77.973 kg), SpO2 98 %.    HEENT:  No thrush or ulcers. Resp:  Lungs clear bilaterally. Cardio:  Regular rate and rhythm. GI:  Abdomen soft and nontender. No hepatomegaly. Vascular:  No leg edema. Neuro:  Alert. Mildly confused. Gait is mildly ataxic.    Lab Results:  Lab Results  Component Value Date   WBC 3.1* 12/12/2015   HGB 9.4* 12/12/2015   HCT 27.9* 12/12/2015   MCV 90.5 12/12/2015   PLT 104* 12/12/2015   NEUTROABS 2.0 12/12/2015    Imaging:  No results found.  Medications: I have reviewed the patient's current medications.  Assessment/Plan: 1. Multiple myeloma: The serum free light chains were elevated 08/29/2009. A CT of the chest 08/31/2009 showed a new pleural mass consistent with progression of multiple myeloma. He completed 12 cycles of Revlimid/Decadron. The serum free light chains were improved to 4.34 on 12/29/2009. A restaging CT of the chest 02/19/2010 showed resolution of right axillary lymphadenopathy and a pleural-based mass. The serum free lambda light chains were again elevated.  Salvage Revlimid/Decadron initiated 12/08/2014  Cycle 2 01/05/2015  Serum light chains improved 01/24/2015  Cycle 3 02/01/2015  Cycle 4 03/02/2015  Cycle 5 03/30/2015  Cycle 6 04/27/2015  Cycle 7 05/25/2015  Cycle 8 06/22/2015  Cycle 9 07/20/2015  Cycle 10 08/18/2015  Changed to maintenance Revlimid  beginning 09/23/2015  Progressive anemia and Elevated serum free light chains 11/13/2015  Changed to weekly Cytoxan/Velcade/Decadron beginning 11/21/2015 2.History of back pain, likely related to the pleural-based mass at the right chest, resolved. 2. Right axillary/subpectoral lymphadenopathy on a CT of the chest 08/31/2009, likely related to multiple myeloma, resolved. 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 He is maintained on every three-month Zometa. 4. Pancytopenia secondary to Revlimid and multiple myeloma. He has persistent mild thrombocytopenia. 5. Hypertension. 6. Status post hip replacement. 7. History of back surgery. 8. History of trigeminal neuralgia. 9. Status post removal of a lipoma from the right axilla in February 2010. 10. 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 11. Esophageal reflux disease, followed by Dr. Janace Hoard.  12. Esophageal stricture, status post an evaluation by Dr. Henrene Pastor. 13. Right vocal cord lesion, status post a biopsy by Dr. 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 He completed radiation under the direction of Dr. Valere Dross on 03/15/2011. 14. Admission with pneumonia 03/26/2010. 15. History of Atrial flutter/fibrillation while hospitalized August 2011. He is followed by Dr. Percival Spanish. 16. Elevated prostate specific antigen, status post a biopsy 01/23/2011. He was found to have a Gleason 6 cancer in 10% of the cores. He is followed with an observation approach. He is now followed by Dr. Diona Fanti. 17. History of Mild hypercalcemia  18. Fall with a left wrist fracture June 2015  19. Right chest wall pain secondary to a right second rib plasmacytoma confirmed on a CT 11/28/2014, resolved 20. History of Hyponatremia-improved   Disposition: Mr. Terpening appears stable. He will  continue weekly Cytoxan/Velcade/Decadron. We will follow-up on the serum light chains from today.  He is  experiencing daytime sleepiness. He is concerned this is related to acyclovir. We will discontinue acyclovir and he will begin prophylactic Valtrex.  He will return for a follow-up visit in 3 weeks. He will contact the office in the interim with any problems.    Jalik Card ANP/GNP-BC   12/12/2015  12:35 PM

## 2015-12-12 NOTE — Telephone Encounter (Signed)
per pof to sch pt appt-gave pt copy of avs °

## 2015-12-12 NOTE — Patient Instructions (Signed)
[facility] Discharge Instructions for Patients Receiving Chemotherapy  Today you received the following chemotherapy agents:  Velcade  To help prevent nausea and vomiting after your treatment, we encourage you to take your nausea medication as prescribed.   If you develop nausea and vomiting that is not controlled by your nausea medication, call the clinic.   BELOW ARE SYMPTOMS THAT SHOULD BE REPORTED IMMEDIATELY:  *FEVER GREATER THAN 100.5 F  *CHILLS WITH OR WITHOUT FEVER  NAUSEA AND VOMITING THAT IS NOT CONTROLLED WITH YOUR NAUSEA MEDICATION  *UNUSUAL SHORTNESS OF BREATH  *UNUSUAL BRUISING OR BLEEDING  TENDERNESS IN MOUTH AND THROAT WITH OR WITHOUT PRESENCE OF ULCERS  *URINARY PROBLEMS  *BOWEL PROBLEMS  UNUSUAL RASH Items with * indicate a potential emergency and should be followed up as soon as possible.  Feel free to call the clinic you have any questions or concerns. The clinic phone number is [phone].  Please show the CHEMO ALERT CARD at check-in to the Emergency Department and triage nurse.   

## 2015-12-13 ENCOUNTER — Telehealth: Payer: Self-pay | Admitting: *Deleted

## 2015-12-13 LAB — KAPPA/LAMBDA LIGHT CHAINS
Kappa free light chain: 1.09 mg/dL (ref 0.33–1.94)
Kappa:Lambda Ratio: 0.01 — ABNORMAL LOW (ref 0.26–1.65)
Lambda Free Lght Chn: 129 mg/dL — ABNORMAL HIGH (ref 0.57–2.63)

## 2015-12-13 NOTE — Telephone Encounter (Signed)
Spoke with pt's daughter, informed her of light chains result. She voiced appreciation for call.

## 2015-12-13 NOTE — Telephone Encounter (Signed)
-----   Message from Richard Pier, MD sent at 12/13/2015  3:36 PM EST ----- Please call patient, light chains are better, f/u as scheduled

## 2015-12-17 ENCOUNTER — Other Ambulatory Visit: Payer: Self-pay | Admitting: Oncology

## 2015-12-18 ENCOUNTER — Other Ambulatory Visit: Payer: Self-pay | Admitting: Oncology

## 2015-12-18 DIAGNOSIS — M8580 Other specified disorders of bone density and structure, unspecified site: Secondary | ICD-10-CM

## 2015-12-19 ENCOUNTER — Other Ambulatory Visit (HOSPITAL_BASED_OUTPATIENT_CLINIC_OR_DEPARTMENT_OTHER): Payer: Medicare Other

## 2015-12-19 ENCOUNTER — Telehealth: Payer: Self-pay | Admitting: *Deleted

## 2015-12-19 ENCOUNTER — Ambulatory Visit (HOSPITAL_BASED_OUTPATIENT_CLINIC_OR_DEPARTMENT_OTHER): Payer: Medicare Other

## 2015-12-19 VITALS — BP 146/70 | HR 93 | Temp 97.4°F | Resp 20

## 2015-12-19 DIAGNOSIS — Z5112 Encounter for antineoplastic immunotherapy: Secondary | ICD-10-CM

## 2015-12-19 DIAGNOSIS — C9002 Multiple myeloma in relapse: Secondary | ICD-10-CM

## 2015-12-19 DIAGNOSIS — C9000 Multiple myeloma not having achieved remission: Secondary | ICD-10-CM

## 2015-12-19 LAB — CBC WITH DIFFERENTIAL/PLATELET
BASO%: 0.2 % (ref 0.0–2.0)
Basophils Absolute: 0 10*3/uL (ref 0.0–0.1)
EOS%: 0.8 % (ref 0.0–7.0)
Eosinophils Absolute: 0 10*3/uL (ref 0.0–0.5)
HCT: 30 % — ABNORMAL LOW (ref 38.4–49.9)
HGB: 10 g/dL — ABNORMAL LOW (ref 13.0–17.1)
LYMPH%: 16.1 % (ref 14.0–49.0)
MCH: 30.6 pg (ref 27.2–33.4)
MCHC: 33.3 g/dL (ref 32.0–36.0)
MCV: 92.1 fL (ref 79.3–98.0)
MONO#: 0.4 10*3/uL (ref 0.1–0.9)
MONO%: 12.2 % (ref 0.0–14.0)
NEUT%: 70.7 % (ref 39.0–75.0)
NEUTROS ABS: 2.1 10*3/uL (ref 1.5–6.5)
Platelets: 73 10*3/uL — ABNORMAL LOW (ref 140–400)
RBC: 3.26 10*6/uL — AB (ref 4.20–5.82)
RDW: 21 % — ABNORMAL HIGH (ref 11.0–14.6)
WBC: 3 10*3/uL — AB (ref 4.0–10.3)
lymph#: 0.5 10*3/uL — ABNORMAL LOW (ref 0.9–3.3)

## 2015-12-19 LAB — COMPREHENSIVE METABOLIC PANEL
ALT: 16 U/L (ref 0–55)
ANION GAP: 9 meq/L (ref 3–11)
AST: 17 U/L (ref 5–34)
Albumin: 3.6 g/dL (ref 3.5–5.0)
Alkaline Phosphatase: 74 U/L (ref 40–150)
BILIRUBIN TOTAL: 0.81 mg/dL (ref 0.20–1.20)
BUN: 13.5 mg/dL (ref 7.0–26.0)
CHLORIDE: 97 meq/L — AB (ref 98–109)
CO2: 26 meq/L (ref 22–29)
CREATININE: 1.1 mg/dL (ref 0.7–1.3)
Calcium: 10 mg/dL (ref 8.4–10.4)
EGFR: 66 mL/min/{1.73_m2} — ABNORMAL LOW (ref >90)
GLUCOSE: 105 mg/dL (ref 70–140)
Potassium: 3.9 mEq/L (ref 3.5–5.1)
SODIUM: 131 meq/L — AB (ref 136–145)
TOTAL PROTEIN: 6.1 g/dL — AB (ref 6.4–8.3)

## 2015-12-19 MED ORDER — BORTEZOMIB CHEMO SQ INJECTION 3.5 MG (2.5MG/ML)
1.3000 mg/m2 | Freq: Once | INTRAMUSCULAR | Status: AC
  Administered 2015-12-19: 2.5 mg via SUBCUTANEOUS
  Filled 2015-12-19: qty 2.5

## 2015-12-19 MED ORDER — ONDANSETRON HCL 8 MG PO TABS
8.0000 mg | ORAL_TABLET | Freq: Once | ORAL | Status: AC
  Administered 2015-12-19: 8 mg via ORAL

## 2015-12-19 MED ORDER — ONDANSETRON HCL 8 MG PO TABS
ORAL_TABLET | ORAL | Status: AC
  Filled 2015-12-19: qty 1

## 2015-12-19 NOTE — Patient Instructions (Signed)
Bortezomib injection What is this medicine? BORTEZOMIB (bor TEZ oh mib) is a medicine that targets proteins in cancer cells and stops the cancer cells from growing. It is used to treat multiple myeloma and mantle-cell lymphoma. This medicine may be used for other purposes; ask your health care provider or pharmacist if you have questions. What should I tell my health care provider before I take this medicine? They need to know if you have any of these conditions: -diabetes -heart disease -irregular heartbeat -liver disease -on hemodialysis -low blood counts, like low white blood cells, platelets, or hemoglobin -peripheral neuropathy -taking medicine for blood pressure -an unusual or allergic reaction to bortezomib, mannitol, boron, other medicines, foods, dyes, or preservatives -pregnant or trying to get pregnant -breast-feeding How should I use this medicine? This medicine is for injection into a vein or for injection under the skin. It is given by a health care professional in a hospital or clinic setting. Talk to your pediatrician regarding the use of this medicine in children. Special care may be needed. Overdosage: If you think you have taken too much of this medicine contact a poison control center or emergency room at once. NOTE: This medicine is only for you. Do not share this medicine with others. What if I miss a dose? It is important not to miss your dose. Call your doctor or health care professional if you are unable to keep an appointment. What may interact with this medicine? This medicine may interact with the following medications: -ketoconazole -rifampin -ritonavir -St. John's Wort This list may not describe all possible interactions. Give your health care provider a list of all the medicines, herbs, non-prescription drugs, or dietary supplements you use. Also tell them if you smoke, drink alcohol, or use illegal drugs. Some items may interact with your medicine. What  should I watch for while using this medicine? Visit your doctor for checks on your progress. This drug may make you feel generally unwell. This is not uncommon, as chemotherapy can affect healthy cells as well as cancer cells. Report any side effects. Continue your course of treatment even though you feel ill unless your doctor tells you to stop. You may get drowsy or dizzy. Do not drive, use machinery, or do anything that needs mental alertness until you know how this medicine affects you. Do not stand or sit up quickly, especially if you are an older patient. This reduces the risk of dizzy or fainting spells. In some cases, you may be given additional medicines to help with side effects. Follow all directions for their use. Call your doctor or health care professional for advice if you get a fever, chills or sore throat, or other symptoms of a cold or flu. Do not treat yourself. This drug decreases your body's ability to fight infections. Try to avoid being around people who are sick. This medicine may increase your risk to bruise or bleed. Call your doctor or health care professional if you notice any unusual bleeding. You may need blood work done while you are taking this medicine. In some patients, this medicine may cause a serious brain infection that may cause death. If you have any problems seeing, thinking, speaking, walking, or standing, tell your doctor right away. If you cannot reach your doctor, urgently seek other source of medical care. Do not become pregnant while taking this medicine. Women should inform their doctor if they wish to become pregnant or think they might be pregnant. There is a potential for serious  side effects to an unborn child. Talk to your health care professional or pharmacist for more information. Do not breast-feed an infant while taking this medicine. Check with your doctor or health care professional if you get an attack of severe diarrhea, nausea and vomiting, or if  you sweat a lot. The loss of too much body fluid can make it dangerous for you to take this medicine. What side effects may I notice from receiving this medicine? Side effects that you should report to your doctor or health care professional as soon as possible: -allergic reactions like skin rash, itching or hives, swelling of the face, lips, or tongue -breathing problems -changes in hearing -changes in vision -fast, irregular heartbeat -feeling faint or lightheaded, falls -pain, tingling, numbness in the hands or feet -right upper belly pain -seizures -swelling of the ankles, feet, hands -unusual bleeding or bruising -unusually weak or tired -vomiting -yellowing of the eyes or skin Side effects that usually do not require medical attention (report to your doctor or health care professional if they continue or are bothersome): -changes in emotions or moods -constipation -diarrhea -loss of appetite -headache -irritation at site where injected -nausea This list may not describe all possible side effects. Call your doctor for medical advice about side effects. You may report side effects to FDA at 1-800-FDA-1088. Where should I keep my medicine? This drug is given in a hospital or clinic and will not be stored at home. NOTE: This sheet is a summary. It may not cover all possible information. If you have questions about this medicine, talk to your doctor, pharmacist, or health care provider.    2016, [employer] Standard. (2015-01-24 14:47:04)

## 2015-12-19 NOTE — Telephone Encounter (Signed)
VM message received from patient's daughter, Richard [name]. She states that his new medication (Valtrex)is causing him to have constipation and an upset stomach.  They are switching back to Acyclovir as originally prescribed.  Apparently Acyclovir was making him sleepy during the day and a little dizzy, but pt prefers those side effects vs the constipation.  They will discuss again at next MD visit.

## 2015-12-24 ENCOUNTER — Other Ambulatory Visit: Payer: Self-pay | Admitting: Oncology

## 2015-12-26 ENCOUNTER — Ambulatory Visit: Payer: Medicare Other

## 2015-12-26 ENCOUNTER — Other Ambulatory Visit (HOSPITAL_BASED_OUTPATIENT_CLINIC_OR_DEPARTMENT_OTHER): Payer: Medicare Other

## 2015-12-26 DIAGNOSIS — C9000 Multiple myeloma not having achieved remission: Secondary | ICD-10-CM

## 2015-12-26 LAB — CBC WITH DIFFERENTIAL/PLATELET
BASO%: 0 % (ref 0.0–2.0)
BASOS ABS: 0 10*3/uL (ref 0.0–0.1)
EOS ABS: 0 10*3/uL (ref 0.0–0.5)
EOS%: 0.5 % (ref 0.0–7.0)
HCT: 30.1 % — ABNORMAL LOW (ref 38.4–49.9)
HEMOGLOBIN: 10.1 g/dL — AB (ref 13.0–17.1)
LYMPH%: 15.9 % (ref 14.0–49.0)
MCH: 31.2 pg (ref 27.2–33.4)
MCHC: 33.6 g/dL (ref 32.0–36.0)
MCV: 92.8 fL (ref 79.3–98.0)
MONO#: 0.5 10*3/uL (ref 0.1–0.9)
MONO%: 15.9 % — AB (ref 0.0–14.0)
NEUT%: 67.7 % (ref 39.0–75.0)
NEUTROS ABS: 2.2 10*3/uL (ref 1.5–6.5)
PLATELETS: 61 10*3/uL — AB (ref 140–400)
RBC: 3.24 10*6/uL — ABNORMAL LOW (ref 4.20–5.82)
RDW: 22.8 % — AB (ref 11.0–14.6)
WBC: 3.2 10*3/uL — ABNORMAL LOW (ref 4.0–10.3)
lymph#: 0.5 10*3/uL — ABNORMAL LOW (ref 0.9–3.3)

## 2015-12-26 LAB — COMPREHENSIVE METABOLIC PANEL
ALT: 17 U/L (ref 0–55)
AST: 19 U/L (ref 5–34)
Albumin: 3.6 g/dL (ref 3.5–5.0)
Alkaline Phosphatase: 92 U/L (ref 40–150)
Anion Gap: 8 mEq/L (ref 3–11)
BILIRUBIN TOTAL: 0.79 mg/dL (ref 0.20–1.20)
BUN: 13.8 mg/dL (ref 7.0–26.0)
CHLORIDE: 98 meq/L (ref 98–109)
CO2: 25 meq/L (ref 22–29)
CREATININE: 1.1 mg/dL (ref 0.7–1.3)
Calcium: 9.8 mg/dL (ref 8.4–10.4)
EGFR: 66 mL/min/{1.73_m2} — ABNORMAL LOW (ref >90)
GLUCOSE: 97 mg/dL (ref 70–140)
Potassium: 3.7 mEq/L (ref 3.5–5.1)
SODIUM: 131 meq/L — AB (ref 136–145)
TOTAL PROTEIN: 6 g/dL — AB (ref 6.4–8.3)

## 2015-12-26 NOTE — Progress Notes (Signed)
Per Dr. Benay Spice, hold treatment today due to platelet count. Instructed patient and daughter to hold Cytoxan (oral chemo) also. Return next week to recheck labs. Daughter and patient verbalized understanding.

## 2015-12-31 ENCOUNTER — Other Ambulatory Visit: Payer: Self-pay | Admitting: Oncology

## 2016-01-02 ENCOUNTER — Other Ambulatory Visit (HOSPITAL_BASED_OUTPATIENT_CLINIC_OR_DEPARTMENT_OTHER): Payer: Medicare Other

## 2016-01-02 ENCOUNTER — Other Ambulatory Visit: Payer: Self-pay | Admitting: *Deleted

## 2016-01-02 ENCOUNTER — Ambulatory Visit (HOSPITAL_BASED_OUTPATIENT_CLINIC_OR_DEPARTMENT_OTHER): Payer: Medicare Other

## 2016-01-02 ENCOUNTER — Telehealth: Payer: Self-pay | Admitting: Oncology

## 2016-01-02 ENCOUNTER — Ambulatory Visit (HOSPITAL_BASED_OUTPATIENT_CLINIC_OR_DEPARTMENT_OTHER): Payer: Medicare Other | Admitting: Oncology

## 2016-01-02 VITALS — BP 136/66 | HR 86 | Temp 98.8°F | Resp 18 | Ht 68.0 in | Wt 173.9 lb

## 2016-01-02 DIAGNOSIS — C9000 Multiple myeloma not having achieved remission: Secondary | ICD-10-CM

## 2016-01-02 DIAGNOSIS — D6959 Other secondary thrombocytopenia: Secondary | ICD-10-CM

## 2016-01-02 DIAGNOSIS — I10 Essential (primary) hypertension: Secondary | ICD-10-CM

## 2016-01-02 DIAGNOSIS — Z5112 Encounter for antineoplastic immunotherapy: Secondary | ICD-10-CM | POA: Diagnosis not present

## 2016-01-02 DIAGNOSIS — C9002 Multiple myeloma in relapse: Secondary | ICD-10-CM

## 2016-01-02 LAB — CBC WITH DIFFERENTIAL/PLATELET
BASO%: 0.1 % (ref 0.0–2.0)
BASOS ABS: 0 10*3/uL (ref 0.0–0.1)
EOS ABS: 0 10*3/uL (ref 0.0–0.5)
EOS%: 0.2 % (ref 0.0–7.0)
HEMATOCRIT: 30.7 % — AB (ref 38.4–49.9)
HEMOGLOBIN: 10.4 g/dL — AB (ref 13.0–17.1)
LYMPH#: 0.5 10*3/uL — AB (ref 0.9–3.3)
LYMPH%: 12.8 % — ABNORMAL LOW (ref 14.0–49.0)
MCH: 32 pg (ref 27.2–33.4)
MCHC: 34 g/dL (ref 32.0–36.0)
MCV: 94.1 fL (ref 79.3–98.0)
MONO#: 0.7 10*3/uL (ref 0.1–0.9)
MONO%: 16.2 % — ABNORMAL HIGH (ref 0.0–14.0)
NEUT%: 70.7 % (ref 39.0–75.0)
NEUTROS ABS: 2.9 10*3/uL (ref 1.5–6.5)
Platelets: 89 10*3/uL — ABNORMAL LOW (ref 140–400)
RBC: 3.26 10*6/uL — ABNORMAL LOW (ref 4.20–5.82)
RDW: 23.2 % — AB (ref 11.0–14.6)
WBC: 4.1 10*3/uL (ref 4.0–10.3)

## 2016-01-02 LAB — COMPREHENSIVE METABOLIC PANEL
ALBUMIN: 3.7 g/dL (ref 3.5–5.0)
ALK PHOS: 99 U/L (ref 40–150)
ALT: 19 U/L (ref 0–55)
AST: 20 U/L (ref 5–34)
Anion Gap: 7 mEq/L (ref 3–11)
BILIRUBIN TOTAL: 0.82 mg/dL (ref 0.20–1.20)
BUN: 17.9 mg/dL (ref 7.0–26.0)
CALCIUM: 9.9 mg/dL (ref 8.4–10.4)
CO2: 25 mEq/L (ref 22–29)
Chloride: 100 mEq/L (ref 98–109)
Creatinine: 1 mg/dL (ref 0.7–1.3)
EGFR: 69 mL/min/{1.73_m2} — AB (ref >90)
Glucose: 105 mg/dl (ref 70–140)
POTASSIUM: 4 meq/L (ref 3.5–5.1)
Sodium: 132 mEq/L — ABNORMAL LOW (ref 136–145)
TOTAL PROTEIN: 6.1 g/dL — AB (ref 6.4–8.3)

## 2016-01-02 MED ORDER — CYCLOPHOSPHAMIDE 50 MG PO TABS: 500.0000 mg | ORAL_TABLET | ORAL | Status: DC

## 2016-01-02 MED ORDER — BORTEZOMIB CHEMO SQ INJECTION 3.5 MG (2.5MG/ML)
1.3000 mg/m2 | Freq: Once | INTRAMUSCULAR | Status: AC
  Administered 2016-01-02: 2.5 mg via SUBCUTANEOUS
  Filled 2016-01-02: qty 2.5

## 2016-01-02 MED ORDER — ONDANSETRON HCL 8 MG PO TABS
ORAL_TABLET | ORAL | Status: AC
  Filled 2016-01-02: qty 1

## 2016-01-02 MED ORDER — ONDANSETRON HCL 8 MG PO TABS
8.0000 mg | ORAL_TABLET | Freq: Once | ORAL | Status: AC
  Administered 2016-01-02: 8 mg via ORAL

## 2016-01-02 NOTE — Patient Instructions (Signed)
[facility] Discharge Instructions for Patients Receiving Chemotherapy  Today you received the following chemotherapy agents Velcade  To help prevent nausea and vomiting after your treatment, we encourage you to take your nausea medication    If you develop nausea and vomiting that is not controlled by your nausea medication, call the clinic.   BELOW ARE SYMPTOMS THAT SHOULD BE REPORTED IMMEDIATELY:  *FEVER GREATER THAN 100.5 F  *CHILLS WITH OR WITHOUT FEVER  NAUSEA AND VOMITING THAT IS NOT CONTROLLED WITH YOUR NAUSEA MEDICATION  *UNUSUAL SHORTNESS OF BREATH  *UNUSUAL BRUISING OR BLEEDING  TENDERNESS IN MOUTH AND THROAT WITH OR WITHOUT PRESENCE OF ULCERS  *URINARY PROBLEMS  *BOWEL PROBLEMS  UNUSUAL RASH Items with * indicate a potential emergency and should be followed up as soon as possible.  Feel free to call the clinic you have any questions or concerns. The clinic phone number is [phone].  Please show the CHEMO ALERT CARD at check-in to the Emergency Department and triage nurse.   

## 2016-01-02 NOTE — Telephone Encounter (Signed)
Talked with and scheduled this patient’s appointment(s) while patient was here in our office.       [facility]. °

## 2016-01-02 NOTE — Progress Notes (Signed)
[facility] OFFICE PROGRESS NOTE   Diagnosis: Multiple myeloma  INTERVAL HISTORY:   Mr. Sachse returns as scheduled. He began Cytoxan/Velcade/Decadron 11/21/2015. He reports tolerating the treatment well. No nausea or neuropathy symptoms. His energy level and appetite have improved. No complaint.  Objective:  Vital signs in last 24 hours:  Blood pressure 136/66, pulse 86, temperature 98.8 F (37.1 C), temperature source Oral, resp. rate 18, height _0  (1.727 m), weight 173 lb 14.4 oz (78.881 kg), SpO2 99 %.    HEENT: No thrush or ulcers, ecchymosis over the tongue (CTs he reports "biting "his tongue)  Resp: Lungs clear bilaterally Cardio: Rate and rhythm GI: No hepatosplenomegaly Vascular: No leg edema   Lab Results:  Lab Results  Component Value Date   WBC 4.1 01/02/2016   HGB 10.4* 01/02/2016   HCT 30.7* 01/02/2016   MCV 94.1 01/02/2016   PLT 89* 01/02/2016   NEUTROABS 2.9 01/02/2016   Serum free lambda light chains on 12/12/2015-129  Medications: I have reviewed the patient's current medications.  Assessment/Plan: 1. Multiple myeloma: The serum free light chains were elevated 08/29/2009. A CT of the chest 08/31/2009 showed a new pleural mass consistent with progression of multiple myeloma. He completed 12 cycles of Revlimid/Decadron. The serum free light chains were improved to 4.34 on 12/29/2009. A restaging CT of the chest 02/19/2010 showed resolution of right axillary lymphadenopathy and a pleural-based mass. The serum free lambda light chains were again elevated.  Salvage Revlimid/Decadron initiated 12/08/2014  Cycle 2 01/05/2015  Serum light chains improved 01/24/2015  Cycle 3 02/01/2015  Cycle 4 03/02/2015  Cycle 5 03/30/2015  Cycle 6 04/27/2015  Cycle 7 05/25/2015  Cycle 8 06/22/2015  Cycle 9 07/20/2015  Cycle 10 08/18/2015  Changed to maintenance Revlimid beginning 09/23/2015  Progressive anemia and Elevated serum free  light chains 11/13/2015  Changed to weekly Cytoxan/Velcade/Decadron beginning 11/21/2015 2.History of back pain, likely related to the pleural-based mass at the right chest, resolved. 2. Right axillary/subpectoral lymphadenopathy on a CT of the chest 08/31/2009, likely related to multiple myeloma, resolved. 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 He is maintained on every three-month Zometa. 4. Pancytopenia secondary to Revlimid and multiple myeloma. He has persistent mild thrombocytopenia. 5. Hypertension. 6. Status post hip replacement. 7. History of back surgery. 8. History of trigeminal neuralgia. 9. Status post removal of a lipoma from the right axilla in February 2010. 10. 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 11. Esophageal reflux disease, followed by Dr. Janace Hoard.  12. Esophageal stricture, status post an evaluation by Dr. Henrene Pastor. 13. Right vocal cord lesion, status post a biopsy by Dr. 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 He completed radiation under the direction of Dr. Valere Dross on 03/15/2011. 14. Admission with pneumonia 03/26/2010. 15. History of Atrial flutter/fibrillation while hospitalized August 2011. He is followed by Dr. Percival Spanish. 16. Elevated prostate specific antigen, status post a biopsy 01/23/2011. He was found to have a Gleason 6 cancer in 10% of the cores. He is followed with an observation approach. He is now followed by Dr. Diona Fanti. 17. History of Mild hypercalcemia  18. Fall with a left wrist fracture June 2015  19. Right chest wall pain secondary to a right second rib plasmacytoma confirmed on a CT 11/28/2014, resolved 20. History of Hyponatremia-improved 21. Thrombocytopenia secondary chemotherapy   Disposition:  Mr. Junker has an improved performance status and the serum light chains have decreased while on Cytoxan/Velcade/Decadron. He will continue the  current treatment on a 3  out of 4 week schedule. The Decadron will continue weekly.  Mr. Beem will return for an office visit in one month. He last received Zometa 11/17/2015. We will discontinue aspirin if the platelet count falls further.  Betsy Coder, MD  01/02/2016  9:24 AM

## 2016-01-02 NOTE — Progress Notes (Signed)
Per Dr. Benay Spice; OK to treat despite counts today.

## 2016-01-03 LAB — KAPPA/LAMBDA LIGHT CHAINS
IG KAPPA FREE LIGHT CHAIN: 9.5 mg/L (ref 3.30–19.40)
Ig Lambda Free Light Chain: 222.68 mg/L — ABNORMAL HIGH (ref 5.71–26.30)
Kappa/Lambda FluidC Ratio: 0.04 — ABNORMAL LOW (ref 0.26–1.65)

## 2016-01-05 ENCOUNTER — Encounter: Payer: Self-pay | Admitting: Oncology

## 2016-01-05 NOTE — Progress Notes (Signed)
Per biologics cyclophospharmide was shipped 01/04/16 via fedex

## 2016-01-09 ENCOUNTER — Other Ambulatory Visit (HOSPITAL_BASED_OUTPATIENT_CLINIC_OR_DEPARTMENT_OTHER): Payer: Medicare Other

## 2016-01-09 ENCOUNTER — Ambulatory Visit (HOSPITAL_BASED_OUTPATIENT_CLINIC_OR_DEPARTMENT_OTHER): Payer: Medicare Other

## 2016-01-09 VITALS — BP 115/92 | HR 51 | Temp 98.3°F | Resp 18

## 2016-01-09 DIAGNOSIS — Z5112 Encounter for antineoplastic immunotherapy: Secondary | ICD-10-CM

## 2016-01-09 DIAGNOSIS — C9002 Multiple myeloma in relapse: Secondary | ICD-10-CM

## 2016-01-09 LAB — CBC WITH DIFFERENTIAL/PLATELET
BASO%: 0.1 % (ref 0.0–2.0)
Basophils Absolute: 0 10*3/uL (ref 0.0–0.1)
EOS%: 0.5 % (ref 0.0–7.0)
Eosinophils Absolute: 0 10*3/uL (ref 0.0–0.5)
HCT: 31.6 % — ABNORMAL LOW (ref 38.4–49.9)
HGB: 10.7 g/dL — ABNORMAL LOW (ref 13.0–17.1)
LYMPH%: 13.4 % — AB (ref 14.0–49.0)
MCH: 32.1 pg (ref 27.2–33.4)
MCHC: 33.9 g/dL (ref 32.0–36.0)
MCV: 94.7 fL (ref 79.3–98.0)
MONO#: 0.8 10*3/uL (ref 0.1–0.9)
MONO%: 14.6 % — AB (ref 0.0–14.0)
NEUT%: 71.4 % (ref 39.0–75.0)
NEUTROS ABS: 3.9 10*3/uL (ref 1.5–6.5)
Platelets: 74 10*3/uL — ABNORMAL LOW (ref 140–400)
RBC: 3.34 10*6/uL — AB (ref 4.20–5.82)
RDW: 22.9 % — ABNORMAL HIGH (ref 11.0–14.6)
WBC: 5.5 10*3/uL (ref 4.0–10.3)
lymph#: 0.7 10*3/uL — ABNORMAL LOW (ref 0.9–3.3)

## 2016-01-09 MED ORDER — ONDANSETRON HCL 8 MG PO TABS
ORAL_TABLET | ORAL | Status: AC
  Filled 2016-01-09: qty 1

## 2016-01-09 MED ORDER — BORTEZOMIB CHEMO SQ INJECTION 3.5 MG (2.5MG/ML)
1.3000 mg/m2 | Freq: Once | INTRAMUSCULAR | Status: AC
  Administered 2016-01-09: 2.5 mg via SUBCUTANEOUS
  Filled 2016-01-09: qty 2.5

## 2016-01-09 MED ORDER — ONDANSETRON HCL 8 MG PO TABS
8.0000 mg | ORAL_TABLET | Freq: Once | ORAL | Status: AC
  Administered 2016-01-09: 8 mg via ORAL

## 2016-01-09 NOTE — Patient Instructions (Signed)
[facility] Discharge Instructions for Patients Receiving Chemotherapy  Today you received the following chemotherapy agents Velcade  To help prevent nausea and vomiting after your treatment, we encourage you to take your nausea medication    If you develop nausea and vomiting that is not controlled by your nausea medication, call the clinic.   BELOW ARE SYMPTOMS THAT SHOULD BE REPORTED IMMEDIATELY:  *FEVER GREATER THAN 100.5 F  *CHILLS WITH OR WITHOUT FEVER  NAUSEA AND VOMITING THAT IS NOT CONTROLLED WITH YOUR NAUSEA MEDICATION  *UNUSUAL SHORTNESS OF BREATH  *UNUSUAL BRUISING OR BLEEDING  TENDERNESS IN MOUTH AND THROAT WITH OR WITHOUT PRESENCE OF ULCERS  *URINARY PROBLEMS  *BOWEL PROBLEMS  UNUSUAL RASH Items with * indicate a potential emergency and should be followed up as soon as possible.  Feel free to call the clinic you have any questions or concerns. The clinic phone number is [phone].  Please show the CHEMO ALERT CARD at check-in to the Emergency Department and triage nurse.   

## 2016-01-09 NOTE — Progress Notes (Signed)
Per Dr. Benay Spice, okay to treat pt with platelet counts today. Pt does not need CMET drawn today as well per Dr. Benay Spice. Pt was also to have PSA drawn for outpt urology. No lab orders were faxed to the lab today. Daughter called urology and office and pt will have psa checked next future lab appt.

## 2016-01-13 ENCOUNTER — Other Ambulatory Visit: Payer: Self-pay | Admitting: Oncology

## 2016-01-15 ENCOUNTER — Other Ambulatory Visit: Payer: Medicare Other

## 2016-01-16 ENCOUNTER — Other Ambulatory Visit (HOSPITAL_BASED_OUTPATIENT_CLINIC_OR_DEPARTMENT_OTHER): Payer: Medicare Other

## 2016-01-16 ENCOUNTER — Ambulatory Visit (HOSPITAL_BASED_OUTPATIENT_CLINIC_OR_DEPARTMENT_OTHER): Payer: Medicare Other

## 2016-01-16 VITALS — BP 157/74 | HR 83 | Temp 98.6°F | Resp 17

## 2016-01-16 DIAGNOSIS — Z5112 Encounter for antineoplastic immunotherapy: Secondary | ICD-10-CM

## 2016-01-16 DIAGNOSIS — C9002 Multiple myeloma in relapse: Secondary | ICD-10-CM | POA: Diagnosis not present

## 2016-01-16 LAB — CBC WITH DIFFERENTIAL/PLATELET
BASO%: 0.2 % (ref 0.0–2.0)
Basophils Absolute: 0 10*3/uL (ref 0.0–0.1)
EOS ABS: 0 10*3/uL (ref 0.0–0.5)
EOS%: 0.3 % (ref 0.0–7.0)
HCT: 30.2 % — ABNORMAL LOW (ref 38.4–49.9)
HEMOGLOBIN: 10.3 g/dL — AB (ref 13.0–17.1)
LYMPH#: 0.5 10*3/uL — AB (ref 0.9–3.3)
LYMPH%: 9.6 % — ABNORMAL LOW (ref 14.0–49.0)
MCH: 32.4 pg (ref 27.2–33.4)
MCHC: 34 g/dL (ref 32.0–36.0)
MCV: 95.4 fL (ref 79.3–98.0)
MONO#: 0.6 10*3/uL (ref 0.1–0.9)
MONO%: 10.1 % (ref 0.0–14.0)
NEUT%: 79.8 % — ABNORMAL HIGH (ref 39.0–75.0)
NEUTROS ABS: 4.3 10*3/uL (ref 1.5–6.5)
PLATELETS: 63 10*3/uL — AB (ref 140–400)
RBC: 3.17 10*6/uL — ABNORMAL LOW (ref 4.20–5.82)
RDW: 23.2 % — AB (ref 11.0–14.6)
WBC: 5.5 10*3/uL (ref 4.0–10.3)

## 2016-01-16 MED ORDER — ONDANSETRON HCL 8 MG PO TABS
8.0000 mg | ORAL_TABLET | Freq: Once | ORAL | Status: AC
  Administered 2016-01-16: 8 mg via ORAL

## 2016-01-16 MED ORDER — ONDANSETRON HCL 8 MG PO TABS
ORAL_TABLET | ORAL | Status: AC
  Filled 2016-01-16: qty 1

## 2016-01-16 MED ORDER — BORTEZOMIB CHEMO SQ INJECTION 3.5 MG (2.5MG/ML)
1.3000 mg/m2 | Freq: Once | INTRAMUSCULAR | Status: AC
  Administered 2016-01-16: 2.5 mg via SUBCUTANEOUS
  Filled 2016-01-16: qty 2.5

## 2016-01-16 NOTE — Patient Instructions (Signed)
[facility] Discharge Instructions for Patients Receiving Chemotherapy  Today you received the following chemotherapy agents Velcade  To help prevent nausea and vomiting after your treatment, we encourage you to take your nausea medication    If you develop nausea and vomiting that is not controlled by your nausea medication, call the clinic.   BELOW ARE SYMPTOMS THAT SHOULD BE REPORTED IMMEDIATELY:  *FEVER GREATER THAN 100.5 F  *CHILLS WITH OR WITHOUT FEVER  NAUSEA AND VOMITING THAT IS NOT CONTROLLED WITH YOUR NAUSEA MEDICATION  *UNUSUAL SHORTNESS OF BREATH  *UNUSUAL BRUISING OR BLEEDING  TENDERNESS IN MOUTH AND THROAT WITH OR WITHOUT PRESENCE OF ULCERS  *URINARY PROBLEMS  *BOWEL PROBLEMS  UNUSUAL RASH Items with * indicate a potential emergency and should be followed up as soon as possible.  Feel free to call the clinic you have any questions or concerns. The clinic phone number is [phone].  Please show the CHEMO ALERT CARD at check-in to the Emergency Department and triage nurse.   

## 2016-01-16 NOTE — Progress Notes (Signed)
OK to treat despite labs per Dr. Benay Spice.  (Platelets 63)

## 2016-01-28 ENCOUNTER — Other Ambulatory Visit: Payer: Self-pay | Admitting: Oncology

## 2016-01-29 ENCOUNTER — Other Ambulatory Visit: Payer: Self-pay | Admitting: *Deleted

## 2016-01-29 DIAGNOSIS — C9002 Multiple myeloma in relapse: Secondary | ICD-10-CM

## 2016-01-29 MED ORDER — CYCLOPHOSPHAMIDE 50 MG PO TABS: 500.0000 mg | ORAL_TABLET | ORAL | Status: DC

## 2016-01-30 ENCOUNTER — Ambulatory Visit (HOSPITAL_BASED_OUTPATIENT_CLINIC_OR_DEPARTMENT_OTHER): Payer: Medicare Other

## 2016-01-30 ENCOUNTER — Ambulatory Visit (HOSPITAL_BASED_OUTPATIENT_CLINIC_OR_DEPARTMENT_OTHER): Payer: Medicare Other | Admitting: Oncology

## 2016-01-30 ENCOUNTER — Telehealth: Payer: Self-pay | Admitting: *Deleted

## 2016-01-30 ENCOUNTER — Telehealth: Payer: Self-pay | Admitting: Oncology

## 2016-01-30 ENCOUNTER — Other Ambulatory Visit (HOSPITAL_BASED_OUTPATIENT_CLINIC_OR_DEPARTMENT_OTHER): Payer: Medicare Other

## 2016-01-30 VITALS — BP 143/61 | HR 89 | Temp 97.8°F | Resp 17 | Ht 68.0 in | Wt 179.7 lb

## 2016-01-30 DIAGNOSIS — C9002 Multiple myeloma in relapse: Secondary | ICD-10-CM

## 2016-01-30 DIAGNOSIS — Z5112 Encounter for antineoplastic immunotherapy: Secondary | ICD-10-CM

## 2016-01-30 DIAGNOSIS — C9000 Multiple myeloma not having achieved remission: Secondary | ICD-10-CM

## 2016-01-30 LAB — COMPREHENSIVE METABOLIC PANEL
ALBUMIN: 3.8 g/dL (ref 3.5–5.0)
ALK PHOS: 84 U/L (ref 40–150)
ALT: 25 U/L (ref 0–55)
ANION GAP: 9 meq/L (ref 3–11)
AST: 25 U/L (ref 5–34)
BILIRUBIN TOTAL: 1.27 mg/dL — AB (ref 0.20–1.20)
BUN: 14.1 mg/dL (ref 7.0–26.0)
CO2: 26 meq/L (ref 22–29)
Calcium: 9.8 mg/dL (ref 8.4–10.4)
Chloride: 97 mEq/L — ABNORMAL LOW (ref 98–109)
Creatinine: 1.1 mg/dL (ref 0.7–1.3)
EGFR: 66 mL/min/{1.73_m2} — AB (ref >90)
Glucose: 108 mg/dl (ref 70–140)
Potassium: 3.8 mEq/L (ref 3.5–5.1)
Sodium: 131 mEq/L — ABNORMAL LOW (ref 136–145)
TOTAL PROTEIN: 6.3 g/dL — AB (ref 6.4–8.3)

## 2016-01-30 LAB — CBC WITH DIFFERENTIAL/PLATELET
BASO%: 0.2 % (ref 0.0–2.0)
Basophils Absolute: 0 10*3/uL (ref 0.0–0.1)
EOS%: 0.5 % (ref 0.0–7.0)
Eosinophils Absolute: 0 10*3/uL (ref 0.0–0.5)
HCT: 32.1 % — ABNORMAL LOW (ref 38.4–49.9)
HEMOGLOBIN: 10.9 g/dL — AB (ref 13.0–17.1)
LYMPH%: 20.6 % (ref 14.0–49.0)
MCH: 33.1 pg (ref 27.2–33.4)
MCHC: 33.9 g/dL (ref 32.0–36.0)
MCV: 97.5 fL (ref 79.3–98.0)
MONO#: 0.6 10*3/uL (ref 0.1–0.9)
MONO%: 14.4 % — ABNORMAL HIGH (ref 0.0–14.0)
NEUT%: 64.3 % (ref 39.0–75.0)
NEUTROS ABS: 2.5 10*3/uL (ref 1.5–6.5)
PLATELETS: 105 10*3/uL — AB (ref 140–400)
RBC: 3.29 10*6/uL — AB (ref 4.20–5.82)
RDW: 22.1 % — AB (ref 11.0–14.6)
WBC: 4 10*3/uL (ref 4.0–10.3)
lymph#: 0.8 10*3/uL — ABNORMAL LOW (ref 0.9–3.3)

## 2016-01-30 MED ORDER — ONDANSETRON HCL 8 MG PO TABS
8.0000 mg | ORAL_TABLET | Freq: Once | ORAL | Status: AC
  Administered 2016-01-30: 8 mg via ORAL

## 2016-01-30 MED ORDER — BORTEZOMIB CHEMO SQ INJECTION 3.5 MG (2.5MG/ML)
1.3000 mg/m2 | Freq: Once | INTRAMUSCULAR | Status: AC
  Administered 2016-01-30: 2.5 mg via SUBCUTANEOUS
  Filled 2016-01-30: qty 2.5

## 2016-01-30 MED ORDER — ONDANSETRON HCL 8 MG PO TABS
ORAL_TABLET | ORAL | Status: AC
  Filled 2016-01-30: qty 1

## 2016-01-30 NOTE — Telephone Encounter (Signed)
Pt confirmed labs/ov per 02/21 POF, gave pt AVS and Calendar... KJ. Sent msg to add chemo

## 2016-01-30 NOTE — Telephone Encounter (Signed)
Per staff message and POF I have scheduled appts. Advised scheduler of appts. JMW  

## 2016-01-30 NOTE — Addendum Note (Signed)
Addended by: Betsy Coder B on: 01/30/2016 09:27 AM   Modules accepted: Level of Service

## 2016-01-30 NOTE — Progress Notes (Signed)
[name] OFFICE PROGRESS NOTE   Diagnosis: Multiple myeloma  INTERVAL HISTORY:   Richard [name] returns as scheduled. He is now being treated with systemic therapy 3 out of 4 weeks. He reports tolerating the chemotherapy well. He complains of constipation when he takes "oxycodone for chronic right hip pain. Good appetite and energy level.  Objective:  Vital signs in last 24 hours:  Blood pressure 143/61, pulse 89, temperature 97.8 F (36.6 C), temperature source Oral, resp. rate 17, height 5' 8" (1.727 m), weight 179 lb 11.2 oz (81.511 kg), SpO2 99 %.    HEENT: No thrush, small ecchymosis at the left buccal mucosa Resp: End inspiratory fine rales at the posterior base bilaterally, no respiratory distress Cardio: Regular rate and rhythm GI: No hepatosplenomegaly, no mass, nontender Vascular: No leg edema   Lab Results:  Lab Results  Component Value Date   WBC 4.0 01/30/2016   HGB 10.9* 01/30/2016   HCT 32.1* 01/30/2016   MCV 97.5 01/30/2016   PLT 105* 01/30/2016   NEUTROABS 2.5 01/30/2016     Medications: I have reviewed the patient's current medications.  Assessment/Plan: 1. Multiple myeloma: The serum free light chains were elevated 08/29/2009. A CT of the chest 08/31/2009 showed a new pleural mass consistent with progression of multiple myeloma. He completed 12 cycles of Revlimid/Decadron. The serum free light chains were improved to 4.34 on 12/29/2009. A restaging CT of the chest 02/19/2010 showed resolution of right axillary lymphadenopathy and a pleural-based mass. The serum free lambda light chains were again elevated.  Salvage Revlimid/Decadron initiated 12/08/2014  Cycle 2 01/05/2015  Serum light chains improved 01/24/2015  Cycle 3 02/01/2015  Cycle 4 03/02/2015  Cycle 5 03/30/2015  Cycle 6 04/27/2015  Cycle 7 05/25/2015  Cycle 8 06/22/2015  Cycle 9 07/20/2015  Cycle 10 08/18/2015  Changed to maintenance Revlimid beginning  09/23/2015  Progressive anemia and Elevated serum free light chains 11/13/2015  Changed to weekly Cytoxan/Velcade/Decadron beginning 11/21/2015, changed to 3/4 week schedule beginning 01/02/2016  Serum light chains improved 12/12/2015 2.History of back pain, likely related to the pleural-based mass at the right chest, resolved. 2. Right axillary/subpectoral lymphadenopathy on a CT of the chest 08/31/2009, likely related to multiple myeloma, resolved. 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 He is maintained on every three-month Zometa. 4. Pancytopenia secondary to Revlimid and multiple myeloma. He has persistent mild thrombocytopenia. 5. Hypertension. 6. Status post hip replacement. 7. History of back surgery. 8. History of trigeminal neuralgia. 9. Status post removal of a lipoma from the right axilla in February 2010. 10. 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 11. Esophageal reflux disease, followed by Dr. Janace Hoard.  12. Esophageal stricture, status post an evaluation by Dr. Henrene Pastor. 13. Right vocal cord lesion, status post a biopsy by Dr. 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 He completed radiation under the direction of Dr. Valere Dross on 03/15/2011. 14. Admission with pneumonia 03/26/2010. 15. History of Atrial flutter/fibrillation while hospitalized August 2011. He is followed by Dr. Percival Spanish. 16. Elevated prostate specific antigen, status post a biopsy 01/23/2011. He was found to have a Gleason 6 cancer in 10% of the cores. He is followed with an observation approach. He is now followed by Dr. Diona Fanti. 17. History of Mild hypercalcemia  18. Fall with a left wrist fracture June 2015  19. Right chest wall pain secondary to a right second rib plasmacytoma confirmed on a CT 11/28/2014, resolved 20. History of Hyponatremia-improved 21. Thrombocytopenia  secondary chemotherapy   Disposition: Richard [name] appears  stable. He is tolerating the chemotherapy well. The hemoglobin and light chains have improved. He will begin another cycle of Cytoxan/Velcade/Decadron today. He will receive Zometa when he returns on 02/13/2016.  I recommended he try arthritis strength Tylenol for the hip pain.   Richard [name] will return for an office visit 02/27/2016.   Richard Coder, MD  01/30/2016  9:15 AM

## 2016-01-30 NOTE — Patient Instructions (Signed)
[facility] for Patients Receiving Chemotherapy  Today you received the following chemotherapy agents Velcade  To help prevent nausea and vomiting after your treatment, we encourage you to take your nausea medication    If you develop nausea and vomiting that is not controlled by your nausea medication, call the clinic.   BELOW ARE SYMPTOMS THAT SHOULD BE REPORTED IMMEDIATELY:  *FEVER GREATER THAN 100.5 F  *CHILLS WITH OR WITHOUT FEVER  NAUSEA AND VOMITING THAT IS NOT CONTROLLED WITH YOUR NAUSEA MEDICATION  *UNUSUAL SHORTNESS OF BREATH  *UNUSUAL BRUISING OR BLEEDING  TENDERNESS IN MOUTH AND THROAT WITH OR WITHOUT PRESENCE OF ULCERS  *URINARY PROBLEMS  *BOWEL PROBLEMS  UNUSUAL RASH Items with * indicate a potential emergency and should be followed up as soon as possible.  Feel free to call the clinic you have any questions or concerns. The clinic phone number is [phone].  Please show the CHEMO ALERT CARD at check-in to the Emergency Department and triage nurse.   

## 2016-01-31 LAB — KAPPA/LAMBDA LIGHT CHAINS
IG KAPPA FREE LIGHT CHAIN: 10.92 mg/L (ref 3.30–19.40)
Ig Lambda Free Light Chain: 114.75 mg/L — ABNORMAL HIGH (ref 5.71–26.30)
Kappa/Lambda FluidC Ratio: 0.1 — ABNORMAL LOW (ref 0.26–1.65)

## 2016-02-01 ENCOUNTER — Telehealth: Payer: Self-pay | Admitting: *Deleted

## 2016-02-01 NOTE — Telephone Encounter (Signed)
Message left for pt.'s daughter Rip Harbour that light chains results are better per Dr. Benay Spice.

## 2016-02-01 NOTE — Telephone Encounter (Signed)
-----   Message from Ladell Pier, MD sent at 01/31/2016  4:59 PM EST ----- Please call patient, light chains are better, f/u as scheduled

## 2016-02-02 ENCOUNTER — Encounter: Payer: Self-pay | Admitting: Oncology

## 2016-02-02 NOTE — Progress Notes (Signed)
Per [facility]ologics cyclophoshamide was shipped via fedex 02/01/16

## 2016-02-04 ENCOUNTER — Other Ambulatory Visit: Payer: Self-pay | Admitting: Oncology

## 2016-02-05 ENCOUNTER — Telehealth: Payer: Self-pay | Admitting: Oncology

## 2016-02-05 NOTE — Telephone Encounter (Signed)
Returned call to patient dtr re change time for 3/21 appointments to after 12 pm. Gave dtr new time for 3/21 @ 1:15 pm.

## 2016-02-06 ENCOUNTER — Ambulatory Visit (HOSPITAL_BASED_OUTPATIENT_CLINIC_OR_DEPARTMENT_OTHER): Payer: Medicare Other

## 2016-02-06 ENCOUNTER — Other Ambulatory Visit (HOSPITAL_BASED_OUTPATIENT_CLINIC_OR_DEPARTMENT_OTHER): Payer: Medicare Other

## 2016-02-06 VITALS — BP 137/69 | HR 85 | Temp 98.0°F | Resp 16

## 2016-02-06 DIAGNOSIS — C9002 Multiple myeloma in relapse: Secondary | ICD-10-CM

## 2016-02-06 DIAGNOSIS — Z5112 Encounter for antineoplastic immunotherapy: Secondary | ICD-10-CM

## 2016-02-06 LAB — CBC WITH DIFFERENTIAL/PLATELET
BASO%: 0.1 % (ref 0.0–2.0)
BASOS ABS: 0 10*3/uL (ref 0.0–0.1)
EOS ABS: 0 10*3/uL (ref 0.0–0.5)
EOS%: 0.6 % (ref 0.0–7.0)
HEMATOCRIT: 31.1 % — AB (ref 38.4–49.9)
HEMOGLOBIN: 10.7 g/dL — AB (ref 13.0–17.1)
LYMPH#: 0.9 10*3/uL (ref 0.9–3.3)
LYMPH%: 20.6 % (ref 14.0–49.0)
MCH: 33.7 pg — AB (ref 27.2–33.4)
MCHC: 34.4 g/dL (ref 32.0–36.0)
MCV: 98 fL (ref 79.3–98.0)
MONO#: 0.7 10*3/uL (ref 0.1–0.9)
MONO%: 16.3 % — ABNORMAL HIGH (ref 0.0–14.0)
NEUT#: 2.6 10*3/uL (ref 1.5–6.5)
NEUT%: 62.4 % (ref 39.0–75.0)
Platelets: 74 10*3/uL — ABNORMAL LOW (ref 140–400)
RBC: 3.18 10*6/uL — ABNORMAL LOW (ref 4.20–5.82)
RDW: 21.2 % — AB (ref 11.0–14.6)
WBC: 4.2 10*3/uL (ref 4.0–10.3)

## 2016-02-06 MED ORDER — BORTEZOMIB CHEMO SQ INJECTION 3.5 MG (2.5MG/ML)
1.3000 mg/m2 | Freq: Once | INTRAMUSCULAR | Status: AC
  Administered 2016-02-06: 2.5 mg via SUBCUTANEOUS
  Filled 2016-02-06: qty 2.5

## 2016-02-06 MED ORDER — ONDANSETRON HCL 8 MG PO TABS
ORAL_TABLET | ORAL | Status: AC
  Filled 2016-02-06: qty 1

## 2016-02-06 MED ORDER — ONDANSETRON HCL 8 MG PO TABS
8.0000 mg | ORAL_TABLET | Freq: Once | ORAL | Status: AC
  Administered 2016-02-06: 8 mg via ORAL

## 2016-02-06 NOTE — Progress Notes (Signed)
Reviewed labs. Platelets 74k. Ok to treat per Dr. Benay Spice.

## 2016-02-06 NOTE — Patient Instructions (Signed)
[facility] Discharge Instructions for Patients Receiving Chemotherapy  Today you received the following chemotherapy agents Velcade  To help prevent nausea and vomiting after your treatment, we encourage you to take your nausea medication Zofran 8 mg every 8 hours as needed.   If you develop nausea and vomiting that is not controlled by your nausea medication, call the clinic.   BELOW ARE SYMPTOMS THAT SHOULD BE REPORTED IMMEDIATELY:  *FEVER GREATER THAN 100.5 F  *CHILLS WITH OR WITHOUT FEVER  NAUSEA AND VOMITING THAT IS NOT CONTROLLED WITH YOUR NAUSEA MEDICATION  *UNUSUAL SHORTNESS OF BREATH  *UNUSUAL BRUISING OR BLEEDING  TENDERNESS IN MOUTH AND THROAT WITH OR WITHOUT PRESENCE OF ULCERS  *URINARY PROBLEMS  *BOWEL PROBLEMS  UNUSUAL RASH Items with * indicate a potential emergency and should be followed up as soon as possible.  Feel free to call the clinic you have any questions or concerns. The clinic phone number is [phone].  Please show the CHEMO ALERT CARD at check-in to the Emergency Department and triage nurse.   

## 2016-02-13 ENCOUNTER — Other Ambulatory Visit (HOSPITAL_BASED_OUTPATIENT_CLINIC_OR_DEPARTMENT_OTHER): Payer: Medicare Other

## 2016-02-13 ENCOUNTER — Other Ambulatory Visit: Payer: Self-pay | Admitting: Oncology

## 2016-02-13 ENCOUNTER — Ambulatory Visit (HOSPITAL_BASED_OUTPATIENT_CLINIC_OR_DEPARTMENT_OTHER): Payer: Medicare Other

## 2016-02-13 VITALS — BP 150/68 | HR 82 | Temp 97.8°F | Resp 16

## 2016-02-13 DIAGNOSIS — C9002 Multiple myeloma in relapse: Secondary | ICD-10-CM

## 2016-02-13 DIAGNOSIS — Z5112 Encounter for antineoplastic immunotherapy: Secondary | ICD-10-CM

## 2016-02-13 LAB — CBC WITH DIFFERENTIAL/PLATELET
BASO%: 0.2 % (ref 0.0–2.0)
Basophils Absolute: 0 10*3/uL (ref 0.0–0.1)
EOS ABS: 0 10*3/uL (ref 0.0–0.5)
EOS%: 0.6 % (ref 0.0–7.0)
HEMATOCRIT: 32.2 % — AB (ref 38.4–49.9)
HEMOGLOBIN: 11 g/dL — AB (ref 13.0–17.1)
LYMPH#: 0.8 10*3/uL — AB (ref 0.9–3.3)
LYMPH%: 16.7 % (ref 14.0–49.0)
MCH: 34 pg — ABNORMAL HIGH (ref 27.2–33.4)
MCHC: 34.1 g/dL (ref 32.0–36.0)
MCV: 99.8 fL — AB (ref 79.3–98.0)
MONO#: 0.6 10*3/uL (ref 0.1–0.9)
MONO%: 13.9 % (ref 0.0–14.0)
NEUT%: 68.6 % (ref 39.0–75.0)
NEUTROS ABS: 3.2 10*3/uL (ref 1.5–6.5)
PLATELETS: 74 10*3/uL — AB (ref 140–400)
RBC: 3.22 10*6/uL — ABNORMAL LOW (ref 4.20–5.82)
RDW: 20.3 % — AB (ref 11.0–14.6)
WBC: 4.7 10*3/uL (ref 4.0–10.3)

## 2016-02-13 MED ORDER — SODIUM CHLORIDE 0.9 % IV SOLN
Freq: Once | INTRAVENOUS | Status: AC
  Administered 2016-02-13: 10:00:00 via INTRAVENOUS

## 2016-02-13 MED ORDER — ONDANSETRON HCL 8 MG PO TABS
8.0000 mg | ORAL_TABLET | Freq: Once | ORAL | Status: AC
  Administered 2016-02-13: 8 mg via ORAL

## 2016-02-13 MED ORDER — ONDANSETRON HCL 8 MG PO TABS
ORAL_TABLET | ORAL | Status: AC
  Filled 2016-02-13: qty 1

## 2016-02-13 MED ORDER — BORTEZOMIB CHEMO SQ INJECTION 3.5 MG (2.5MG/ML)
1.3000 mg/m2 | Freq: Once | INTRAMUSCULAR | Status: AC
  Administered 2016-02-13: 2.5 mg via SUBCUTANEOUS
  Filled 2016-02-13: qty 2.5

## 2016-02-13 MED ORDER — ZOLEDRONIC ACID 4 MG/100ML IV SOLN
4.0000 mg | Freq: Once | INTRAVENOUS | Status: AC
  Administered 2016-02-13: 4 mg via INTRAVENOUS
  Filled 2016-02-13: qty 100

## 2016-02-13 NOTE — Progress Notes (Signed)
OK to treat with Velcade with platelets @ 74k per Dr. Benay Spice

## 2016-02-13 NOTE — Patient Instructions (Signed)
[facility] Discharge Instructions for Patients Receiving Chemotherapy  Today you received the following chemotherapy agents Velcade. To help prevent nausea and vomiting after your treatment, we encourage you to take your nausea medication as directed.  If you develop nausea and vomiting that is not controlled by your nausea medication, call the clinic.   BELOW ARE SYMPTOMS THAT SHOULD BE REPORTED IMMEDIATELY:  *FEVER GREATER THAN 100.5 F  *CHILLS WITH OR WITHOUT FEVER  NAUSEA AND VOMITING THAT IS NOT CONTROLLED WITH YOUR NAUSEA MEDICATION  *UNUSUAL SHORTNESS OF BREATH  *UNUSUAL BRUISING OR BLEEDING  TENDERNESS IN MOUTH AND THROAT WITH OR WITHOUT PRESENCE OF ULCERS  *URINARY PROBLEMS  *BOWEL PROBLEMS  UNUSUAL RASH Items with * indicate a potential emergency and should be followed up as soon as possible.  Feel free to call the clinic you have any questions or concerns. The clinic phone number is [phone].  Please show the CHEMO ALERT CARD at check-in to the Emergency Department and triage nurse.    

## 2016-02-27 ENCOUNTER — Ambulatory Visit (HOSPITAL_BASED_OUTPATIENT_CLINIC_OR_DEPARTMENT_OTHER): Payer: Medicare Other

## 2016-02-27 ENCOUNTER — Other Ambulatory Visit (HOSPITAL_BASED_OUTPATIENT_CLINIC_OR_DEPARTMENT_OTHER): Payer: Medicare Other

## 2016-02-27 ENCOUNTER — Other Ambulatory Visit: Payer: Self-pay | Admitting: Oncology

## 2016-02-27 ENCOUNTER — Telehealth: Payer: Self-pay | Admitting: Oncology

## 2016-02-27 ENCOUNTER — Telehealth: Payer: Self-pay | Admitting: *Deleted

## 2016-02-27 ENCOUNTER — Ambulatory Visit (HOSPITAL_BASED_OUTPATIENT_CLINIC_OR_DEPARTMENT_OTHER): Payer: Medicare Other | Admitting: Nurse Practitioner

## 2016-02-27 VITALS — BP 136/56 | HR 87 | Temp 98.3°F | Resp 18 | Ht 68.0 in | Wt 179.5 lb

## 2016-02-27 DIAGNOSIS — C9002 Multiple myeloma in relapse: Secondary | ICD-10-CM

## 2016-02-27 DIAGNOSIS — M25551 Pain in right hip: Secondary | ICD-10-CM

## 2016-02-27 DIAGNOSIS — D6959 Other secondary thrombocytopenia: Secondary | ICD-10-CM | POA: Diagnosis not present

## 2016-02-27 DIAGNOSIS — I10 Essential (primary) hypertension: Secondary | ICD-10-CM | POA: Diagnosis not present

## 2016-02-27 DIAGNOSIS — C9000 Multiple myeloma not having achieved remission: Secondary | ICD-10-CM | POA: Diagnosis not present

## 2016-02-27 DIAGNOSIS — Z5112 Encounter for antineoplastic immunotherapy: Secondary | ICD-10-CM

## 2016-02-27 LAB — COMPREHENSIVE METABOLIC PANEL
ALBUMIN: 3.8 g/dL (ref 3.5–5.0)
ALK PHOS: 59 U/L (ref 40–150)
ALT: 19 U/L (ref 0–55)
AST: 19 U/L (ref 5–34)
Anion Gap: 8 mEq/L (ref 3–11)
BILIRUBIN TOTAL: 1.21 mg/dL — AB (ref 0.20–1.20)
BUN: 19.8 mg/dL (ref 7.0–26.0)
CO2: 25 mEq/L (ref 22–29)
Calcium: 9.9 mg/dL (ref 8.4–10.4)
Chloride: 94 mEq/L — ABNORMAL LOW (ref 98–109)
Creatinine: 1.3 mg/dL (ref 0.7–1.3)
EGFR: 53 mL/min/{1.73_m2} — AB (ref >90)
GLUCOSE: 121 mg/dL (ref 70–140)
Potassium: 4.4 mEq/L (ref 3.5–5.1)
SODIUM: 127 meq/L — AB (ref 136–145)
TOTAL PROTEIN: 6.1 g/dL — AB (ref 6.4–8.3)

## 2016-02-27 LAB — CBC WITH DIFFERENTIAL/PLATELET
BASO%: 0.2 % (ref 0.0–2.0)
Basophils Absolute: 0 10*3/uL (ref 0.0–0.1)
EOS ABS: 0 10*3/uL (ref 0.0–0.5)
EOS%: 0.1 % (ref 0.0–7.0)
HCT: 31.2 % — ABNORMAL LOW (ref 38.4–49.9)
HEMOGLOBIN: 10.9 g/dL — AB (ref 13.0–17.1)
LYMPH%: 4.2 % — ABNORMAL LOW (ref 14.0–49.0)
MCH: 35.1 pg — ABNORMAL HIGH (ref 27.2–33.4)
MCHC: 34.9 g/dL (ref 32.0–36.0)
MCV: 100.7 fL — AB (ref 79.3–98.0)
MONO#: 0.2 10*3/uL (ref 0.1–0.9)
MONO%: 3.6 % (ref 0.0–14.0)
NEUT%: 91.9 % — ABNORMAL HIGH (ref 39.0–75.0)
NEUTROS ABS: 5 10*3/uL (ref 1.5–6.5)
Platelets: 100 10*3/uL — ABNORMAL LOW (ref 140–400)
RBC: 3.1 10*6/uL — ABNORMAL LOW (ref 4.20–5.82)
RDW: 16.9 % — AB (ref 11.0–14.6)
WBC: 5.4 10*3/uL (ref 4.0–10.3)
lymph#: 0.2 10*3/uL — ABNORMAL LOW (ref 0.9–3.3)

## 2016-02-27 MED ORDER — ONDANSETRON HCL 8 MG PO TABS
ORAL_TABLET | ORAL | Status: AC
  Filled 2016-02-27: qty 1

## 2016-02-27 MED ORDER — ROPINIROLE HCL 0.25 MG PO TABS: 0.2500 mg | ORAL_TABLET | Freq: Every evening | ORAL | Status: DC | PRN

## 2016-02-27 MED ORDER — BORTEZOMIB CHEMO SQ INJECTION 3.5 MG (2.5MG/ML)
1.3000 mg/m2 | Freq: Once | INTRAMUSCULAR | Status: AC
  Administered 2016-02-27: 2.5 mg via SUBCUTANEOUS
  Filled 2016-02-27: qty 2.5

## 2016-02-27 MED ORDER — DEXAMETHASONE 4 MG PO TABS: 20.0000 mg | ORAL_TABLET | ORAL | Status: DC

## 2016-02-27 MED ORDER — ONDANSETRON HCL 8 MG PO TABS
8.0000 mg | ORAL_TABLET | Freq: Once | ORAL | Status: AC
  Administered 2016-02-27: 8 mg via ORAL

## 2016-02-27 NOTE — Patient Instructions (Signed)
Bortezomib injection What is this medicine? BORTEZOMIB (bor TEZ oh mib) is a medicine that targets proteins in cancer cells and stops the cancer cells from growing. It is used to treat multiple myeloma and mantle-cell lymphoma. This medicine may be used for other purposes; ask your health care provider or pharmacist if you have questions. What should I tell my health care provider before I take this medicine? They need to know if you have any of these conditions: -diabetes -heart disease -irregular heartbeat -liver disease -on hemodialysis -low blood counts, like low white blood cells, platelets, or hemoglobin -peripheral neuropathy -taking medicine for blood pressure -an unusual or allergic reaction to bortezomib, mannitol, boron, other medicines, foods, dyes, or preservatives -pregnant or trying to get pregnant -breast-feeding How should I use this medicine? This medicine is for injection into a vein or for injection under the skin. It is given by a health care professional in a hospital or clinic setting. Talk to your pediatrician regarding the use of this medicine in children. Special care may be needed. Overdosage: If you think you have taken too much of this medicine contact a poison control center or emergency room at once. NOTE: This medicine is only for you. Do not share this medicine with others. What if I miss a dose? It is important not to miss your dose. Call your doctor or health care professional if you are unable to keep an appointment. What may interact with this medicine? This medicine may interact with the following medications: -ketoconazole -rifampin -ritonavir -St. John's Wort This list may not describe all possible interactions. Give your health care provider a list of all the medicines, herbs, non-prescription drugs, or dietary supplements you use. Also tell them if you smoke, drink alcohol, or use illegal drugs. Some items may interact with your medicine. What  should I watch for while using this medicine? Visit your doctor for checks on your progress. This drug may make you feel generally unwell. This is not uncommon, as chemotherapy can affect healthy cells as well as cancer cells. Report any side effects. Continue your course of treatment even though you feel ill unless your doctor tells you to stop. You may get drowsy or dizzy. Do not drive, use machinery, or do anything that needs mental alertness until you know how this medicine affects you. Do not stand or sit up quickly, especially if you are an older patient. This reduces the risk of dizzy or fainting spells. In some cases, you may be given additional medicines to help with side effects. Follow all directions for their use. Call your doctor or health care professional for advice if you get a fever, chills or sore throat, or other symptoms of a cold or flu. Do not treat yourself. This drug decreases your body's ability to fight infections. Try to avoid being around people who are sick. This medicine may increase your risk to bruise or bleed. Call your doctor or health care professional if you notice any unusual bleeding. You may need blood work done while you are taking this medicine. In some patients, this medicine may cause a serious brain infection that may cause death. If you have any problems seeing, thinking, speaking, walking, or standing, tell your doctor right away. If you cannot reach your doctor, urgently seek other source of medical care. Do not become pregnant while taking this medicine. Women should inform their doctor if they wish to become pregnant or think they might be pregnant. There is a potential for serious  side effects to an unborn child. Talk to your health care professional or pharmacist for more information. Do not breast-feed an infant while taking this medicine. Check with your doctor or health care professional if you get an attack of severe diarrhea, nausea and vomiting, or if  you sweat a lot. The loss of too much body fluid can make it dangerous for you to take this medicine. What side effects may I notice from receiving this medicine? Side effects that you should report to your doctor or health care professional as soon as possible: -allergic reactions like skin rash, itching or hives, swelling of the face, lips, or tongue -breathing problems -changes in hearing -changes in vision -fast, irregular heartbeat -feeling faint or lightheaded, falls -pain, tingling, numbness in the hands or feet -right upper belly pain -seizures -swelling of the ankles, feet, hands -unusual bleeding or bruising -unusually weak or tired -vomiting -yellowing of the eyes or skin Side effects that usually do not require medical attention (report to your doctor or health care professional if they continue or are bothersome): -changes in emotions or moods -constipation -diarrhea -loss of appetite -headache -irritation at site where injected -nausea This list may not describe all possible side effects. Call your doctor for medical advice about side effects. You may report side effects to FDA at 1-800-FDA-1088. Where should I keep my medicine? This drug is given in a hospital or clinic and will not be stored at home. NOTE: This sheet is a summary. It may not cover all possible information. If you have questions about this medicine, talk to your doctor, pharmacist, or health care provider.    2016, [employer] Standard. (2015-01-24 14:47:04)

## 2016-02-27 NOTE — Telephone Encounter (Signed)
per pof to sch pt appt-gave pt copy of avs-MW sch trmt °

## 2016-02-27 NOTE — Telephone Encounter (Signed)
Per staff message and POF I have scheduled appts. Advised scheduler of appts. JMW  

## 2016-02-27 NOTE — Progress Notes (Signed)
[place] OFFICE PROGRESS NOTE   Diagnosis:  Multiple myeloma  INTERVAL HISTORY:   Richard [name] returns as scheduled. He continues Cytoxan/Velcade/Decadron. He denies nausea/vomiting. He has a single mouth sore. No diarrhea or constipation. He has chronic mild right hip pain. He describes his appetite as "fair". He takes Requip periodically for restless legs.  Objective:  Vital signs in last 24 hours:  Blood pressure 136/56, pulse 87, temperature 98.3 F (36.8 C), temperature source Oral, resp. rate 18, height _0  (1.727 m), weight 179 lb 8 oz (81.421 kg), SpO2 98 %.    HEENT: No thrush or ulcers. Resp: Lungs clear bilaterally. Cardio: Regular rate and rhythm. GI: Abdomen soft and nontender. No hepatomegaly. Vascular: No leg edema.     Lab Results:  Lab Results  Component Value Date   WBC 5.4 02/27/2016   HGB 10.9* 02/27/2016   HCT 31.2* 02/27/2016   MCV 100.7* 02/27/2016   PLT 100* 02/27/2016   NEUTROABS 5.0 02/27/2016    Imaging:  No results found.  Medications: I have reviewed the patient's current medications.  Assessment/Plan: 1. Multiple myeloma: The serum free light chains were elevated 08/29/2009. A CT of the chest 08/31/2009 showed a new pleural mass consistent with progression of multiple myeloma. He completed 12 cycles of Revlimid/Decadron. The serum free light chains were improved to 4.34 on 12/29/2009. A restaging CT of the chest 02/19/2010 showed resolution of right axillary lymphadenopathy and a pleural-based mass. The serum free lambda light chains were again elevated.  Salvage Revlimid/Decadron initiated 12/08/2014  Cycle 2 01/05/2015  Serum light chains improved 01/24/2015  Cycle 3 02/01/2015  Cycle 4 03/02/2015  Cycle 5 03/30/2015  Cycle 6 04/27/2015  Cycle 7 05/25/2015  Cycle 8 06/22/2015  Cycle 9 07/20/2015  Cycle 10 08/18/2015  Changed to maintenance Revlimid beginning 09/23/2015  Progressive anemia and  Elevated serum free light chains 11/13/2015  Changed to weekly Cytoxan/Velcade/Decadron beginning 11/21/2015, changed to 3/4 week schedule beginning 01/02/2016  Serum light chains improved 12/12/2015; improved 01/30/2016 2.History of back pain, likely related to the pleural-based mass at the right chest, resolved. 2. Right axillary/subpectoral lymphadenopathy on a CT of the chest 08/31/2009, likely related to multiple myeloma, resolved. 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 He is maintained on every three-month Zometa. 4. Pancytopenia secondary to Revlimid and multiple myeloma. He has persistent mild thrombocytopenia. 5. Hypertension. 6. Status post hip replacement. 7. History of back surgery. 8. History of trigeminal neuralgia. 9. Status post removal of a lipoma from the right axilla in February 2010. 10. 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 11. Esophageal reflux disease, followed by Dr. Janace Hoard.  12. Esophageal stricture, status post an evaluation by Dr. Henrene Pastor. 13. Right vocal cord lesion, status post a biopsy by Dr. 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 He completed radiation under the direction of Dr. Valere Dross on 03/15/2011. 14. Admission with pneumonia 03/26/2010. 15. History of Atrial flutter/fibrillation while hospitalized August 2011. He is followed by Dr. Percival Spanish. 16. Elevated prostate specific antigen, status post a biopsy 01/23/2011. He was found to have a Gleason 6 cancer in 10% of the cores. He is followed with an observation approach. He is now followed by Dr. Diona Fanti. 17. History of Mild hypercalcemia  18. Fall with a left wrist fracture June 2015  19. Right chest wall pain secondary to a right second rib plasmacytoma confirmed on a CT 11/28/2014, resolved 20. History of Hyponatremia-improved 21. Thrombocytopenia secondary chemotherapy  Disposition: Mr. Postle appears stable. The  serum light chains were further improved last month. Plan to continue Cytoxan/Velcade/Decadron 3 weeks out of 4. He will return for a follow-up visit on 03/26/2016. He will contact the office in the interim with any problems.  Plan reviewed with Dr. Benay Spice.  [place]ot[place] Card ANP/GNP-BC   02/27/2016  2:14 PM

## 2016-02-28 ENCOUNTER — Other Ambulatory Visit: Payer: Self-pay | Admitting: *Deleted

## 2016-02-28 DIAGNOSIS — C9002 Multiple myeloma in relapse: Secondary | ICD-10-CM

## 2016-02-28 LAB — KAPPA/LAMBDA LIGHT CHAINS
IG KAPPA FREE LIGHT CHAIN: 9.65 mg/L (ref 3.30–19.40)
Ig Lambda Free Light Chain: 67.26 mg/L — ABNORMAL HIGH (ref 5.71–26.30)
Kappa/Lambda FluidC Ratio: 0.14 — ABNORMAL LOW (ref 0.26–1.65)

## 2016-02-28 MED ORDER — CYCLOPHOSPHAMIDE 50 MG PO TABS: 500.0000 mg | ORAL_TABLET | ORAL | Status: DC

## 2016-03-03 ENCOUNTER — Other Ambulatory Visit: Payer: Self-pay | Admitting: Oncology

## 2016-03-05 ENCOUNTER — Other Ambulatory Visit (HOSPITAL_BASED_OUTPATIENT_CLINIC_OR_DEPARTMENT_OTHER): Payer: Medicare Other

## 2016-03-05 ENCOUNTER — Ambulatory Visit (HOSPITAL_BASED_OUTPATIENT_CLINIC_OR_DEPARTMENT_OTHER): Payer: Medicare Other

## 2016-03-05 VITALS — BP 156/73 | HR 83 | Temp 97.8°F | Resp 18

## 2016-03-05 DIAGNOSIS — Z5112 Encounter for antineoplastic immunotherapy: Secondary | ICD-10-CM | POA: Diagnosis not present

## 2016-03-05 DIAGNOSIS — C9000 Multiple myeloma not having achieved remission: Secondary | ICD-10-CM | POA: Diagnosis not present

## 2016-03-05 DIAGNOSIS — C9002 Multiple myeloma in relapse: Secondary | ICD-10-CM | POA: Diagnosis not present

## 2016-03-05 LAB — CBC WITH DIFFERENTIAL/PLATELET
BASO%: 0.1 % (ref 0.0–2.0)
BASOS ABS: 0 10*3/uL (ref 0.0–0.1)
EOS ABS: 0 10*3/uL (ref 0.0–0.5)
EOS%: 1 % (ref 0.0–7.0)
HEMATOCRIT: 31.3 % — AB (ref 38.4–49.9)
HEMOGLOBIN: 10.8 g/dL — AB (ref 13.0–17.1)
LYMPH#: 0.5 10*3/uL — AB (ref 0.9–3.3)
LYMPH%: 15.6 % (ref 14.0–49.0)
MCH: 34.9 pg — AB (ref 27.2–33.4)
MCHC: 34.6 g/dL (ref 32.0–36.0)
MCV: 101.1 fL — AB (ref 79.3–98.0)
MONO#: 0.3 10*3/uL (ref 0.1–0.9)
MONO%: 9.7 % (ref 0.0–14.0)
NEUT#: 2.6 10*3/uL (ref 1.5–6.5)
NEUT%: 73.6 % (ref 39.0–75.0)
PLATELETS: 73 10*3/uL — AB (ref 140–400)
RBC: 3.1 10*6/uL — ABNORMAL LOW (ref 4.20–5.82)
RDW: 16 % — ABNORMAL HIGH (ref 11.0–14.6)
WBC: 3.5 10*3/uL — ABNORMAL LOW (ref 4.0–10.3)

## 2016-03-05 LAB — TECHNOLOGIST REVIEW

## 2016-03-05 MED ORDER — ONDANSETRON HCL 8 MG PO TABS
8.0000 mg | ORAL_TABLET | Freq: Once | ORAL | Status: AC
  Administered 2016-03-05: 8 mg via ORAL

## 2016-03-05 MED ORDER — ONDANSETRON HCL 8 MG PO TABS
ORAL_TABLET | ORAL | Status: AC
  Filled 2016-03-05: qty 1

## 2016-03-05 MED ORDER — BORTEZOMIB CHEMO SQ INJECTION 3.5 MG (2.5MG/ML)
1.3000 mg/m2 | Freq: Once | INTRAMUSCULAR | Status: AC
  Administered 2016-03-05: 2.5 mg via SUBCUTANEOUS
  Filled 2016-03-05: qty 2.5

## 2016-03-05 NOTE — Progress Notes (Signed)
Patient's platelets this morning are 73K. Contacted Dr. Benay Spice and he gave the OK to treat.  Ma Hillock, RN

## 2016-03-05 NOTE — Patient Instructions (Signed)
Bortezomib injection What is this medicine? BORTEZOMIB (bor TEZ oh mib) is a medicine that targets proteins in cancer cells and stops the cancer cells from growing. It is used to treat multiple myeloma and mantle-cell lymphoma. This medicine may be used for other purposes; ask your health care provider or pharmacist if you have questions. What should I tell my health care provider before I take this medicine? They need to know if you have any of these conditions: -diabetes -heart disease -irregular heartbeat -liver disease -on hemodialysis -low blood counts, like low white blood cells, platelets, or hemoglobin -peripheral neuropathy -taking medicine for blood pressure -an unusual or allergic reaction to bortezomib, mannitol, boron, other medicines, foods, dyes, or preservatives -pregnant or trying to get pregnant -breast-feeding How should I use this medicine? This medicine is for injection into a vein or for injection under the skin. It is given by a health care professional in a hospital or clinic setting. Talk to your pediatrician regarding the use of this medicine in children. Special care may be needed. Overdosage: If you think you have taken too much of this medicine contact a poison control center or emergency room at once. NOTE: This medicine is only for you. Do not share this medicine with others. What if I miss a dose? It is important not to miss your dose. Call your doctor or health care professional if you are unable to keep an appointment. What may interact with this medicine? This medicine may interact with the following medications: -ketoconazole -rifampin -ritonavir -St. John's Wort This list may not describe all possible interactions. Give your health care provider a list of all the medicines, herbs, non-prescription drugs, or dietary supplements you use. Also tell them if you smoke, drink alcohol, or use illegal drugs. Some items may interact with your medicine. What  should I watch for while using this medicine? Visit your doctor for checks on your progress. This drug may make you feel generally unwell. This is not uncommon, as chemotherapy can affect healthy cells as well as cancer cells. Report any side effects. Continue your course of treatment even though you feel ill unless your doctor tells you to stop. You may get drowsy or dizzy. Do not drive, use machinery, or do anything that needs mental alertness until you know how this medicine affects you. Do not stand or sit up quickly, especially if you are an older patient. This reduces the risk of dizzy or fainting spells. In some cases, you may be given additional medicines to help with side effects. Follow all directions for their use. Call your doctor or health care professional for advice if you get a fever, chills or sore throat, or other symptoms of a cold or flu. Do not treat yourself. This drug decreases your body's ability to fight infections. Try to avoid being around people who are sick. This medicine may increase your risk to bruise or bleed. Call your doctor or health care professional if you notice any unusual bleeding. You may need blood work done while you are taking this medicine. In some patients, this medicine may cause a serious brain infection that may cause death. If you have any problems seeing, thinking, speaking, walking, or standing, tell your doctor right away. If you cannot reach your doctor, urgently seek other source of medical care. Do not become pregnant while taking this medicine. Women should inform their doctor if they wish to become pregnant or think they might be pregnant. There is a potential for serious  side effects to an unborn child. Talk to your health care professional or pharmacist for more information. Do not breast-feed an infant while taking this medicine. Check with your doctor or health care professional if you get an attack of severe diarrhea, nausea and vomiting, or if  you sweat a lot. The loss of too much body fluid can make it dangerous for you to take this medicine. What side effects may I notice from receiving this medicine? Side effects that you should report to your doctor or health care professional as soon as possible: -allergic reactions like skin rash, itching or hives, swelling of the face, lips, or tongue -breathing problems -changes in hearing -changes in vision -fast, irregular heartbeat -feeling faint or lightheaded, falls -pain, tingling, numbness in the hands or feet -right upper belly pain -seizures -swelling of the ankles, feet, hands -unusual bleeding or bruising -unusually weak or tired -vomiting -yellowing of the eyes or skin Side effects that usually do not require medical attention (report to your doctor or health care professional if they continue or are bothersome): -changes in emotions or moods -constipation -diarrhea -loss of appetite -headache -irritation at site where injected -nausea This list may not describe all possible side effects. Call your doctor for medical advice about side effects. You may report side effects to FDA at 1-800-FDA-1088. Where should I keep my medicine? This drug is given in a hospital or clinic and will not be stored at home. NOTE: This sheet is a summary. It may not cover all possible information. If you have questions about this medicine, talk to your doctor, pharmacist, or health care provider.    2016, [employer] Standard. (2015-01-24 14:47:04)

## 2016-03-10 ENCOUNTER — Other Ambulatory Visit: Payer: Self-pay | Admitting: Oncology

## 2016-03-12 ENCOUNTER — Ambulatory Visit (HOSPITAL_BASED_OUTPATIENT_CLINIC_OR_DEPARTMENT_OTHER): Payer: Medicare Other

## 2016-03-12 ENCOUNTER — Other Ambulatory Visit (HOSPITAL_BASED_OUTPATIENT_CLINIC_OR_DEPARTMENT_OTHER): Payer: Medicare Other

## 2016-03-12 ENCOUNTER — Other Ambulatory Visit: Payer: Self-pay | Admitting: Oncology

## 2016-03-12 VITALS — BP 142/60 | HR 81 | Temp 98.2°F | Resp 16

## 2016-03-12 DIAGNOSIS — C9002 Multiple myeloma in relapse: Secondary | ICD-10-CM

## 2016-03-12 DIAGNOSIS — Z5112 Encounter for antineoplastic immunotherapy: Secondary | ICD-10-CM

## 2016-03-12 DIAGNOSIS — C9000 Multiple myeloma not having achieved remission: Secondary | ICD-10-CM

## 2016-03-12 LAB — COMPREHENSIVE METABOLIC PANEL
ALBUMIN: 3.6 g/dL (ref 3.5–5.0)
ALK PHOS: 57 U/L (ref 40–150)
ALT: 20 U/L (ref 0–55)
AST: 19 U/L (ref 5–34)
Anion Gap: 7 mEq/L (ref 3–11)
BILIRUBIN TOTAL: 0.9 mg/dL (ref 0.20–1.20)
BUN: 12.2 mg/dL (ref 7.0–26.0)
CO2: 29 meq/L (ref 22–29)
CREATININE: 1.1 mg/dL (ref 0.7–1.3)
Calcium: 10 mg/dL (ref 8.4–10.4)
Chloride: 97 mEq/L — ABNORMAL LOW (ref 98–109)
EGFR: 64 mL/min/{1.73_m2} — ABNORMAL LOW (ref >90)
GLUCOSE: 88 mg/dL (ref 70–140)
Potassium: 3.8 mEq/L (ref 3.5–5.1)
SODIUM: 133 meq/L — AB (ref 136–145)
TOTAL PROTEIN: 6 g/dL — AB (ref 6.4–8.3)

## 2016-03-12 LAB — CBC WITH DIFFERENTIAL/PLATELET
BASO%: 0 % (ref 0.0–2.0)
Basophils Absolute: 0 10*3/uL (ref 0.0–0.1)
EOS ABS: 0 10*3/uL (ref 0.0–0.5)
EOS%: 0.6 % (ref 0.0–7.0)
HCT: 30.6 % — ABNORMAL LOW (ref 38.4–49.9)
HEMOGLOBIN: 10.9 g/dL — AB (ref 13.0–17.1)
LYMPH%: 11.6 % — ABNORMAL LOW (ref 14.0–49.0)
MCH: 35.5 pg — ABNORMAL HIGH (ref 27.2–33.4)
MCHC: 35.6 g/dL (ref 32.0–36.0)
MCV: 99.7 fL — AB (ref 79.3–98.0)
MONO#: 0.7 10*3/uL (ref 0.1–0.9)
MONO%: 18.4 % — AB (ref 0.0–14.0)
NEUT%: 69.4 % (ref 39.0–75.0)
NEUTROS ABS: 2.5 10*3/uL (ref 1.5–6.5)
Platelets: 69 10*3/uL — ABNORMAL LOW (ref 140–400)
RBC: 3.07 10*6/uL — ABNORMAL LOW (ref 4.20–5.82)
RDW: 15.4 % — AB (ref 11.0–14.6)
WBC: 3.5 10*3/uL — AB (ref 4.0–10.3)
lymph#: 0.4 10*3/uL — ABNORMAL LOW (ref 0.9–3.3)

## 2016-03-12 MED ORDER — ONDANSETRON HCL 8 MG PO TABS
8.0000 mg | ORAL_TABLET | Freq: Once | ORAL | Status: AC
  Administered 2016-03-12: 8 mg via ORAL

## 2016-03-12 MED ORDER — BORTEZOMIB CHEMO SQ INJECTION 3.5 MG (2.5MG/ML)
1.3000 mg/m2 | Freq: Once | INTRAMUSCULAR | Status: AC
  Administered 2016-03-12: 2.5 mg via SUBCUTANEOUS
  Filled 2016-03-12: qty 2.5

## 2016-03-12 MED ORDER — ONDANSETRON HCL 8 MG PO TABS
ORAL_TABLET | ORAL | Status: AC
  Filled 2016-03-12: qty 1

## 2016-03-12 NOTE — Progress Notes (Signed)
Reviewed labs. OK to treat with platelets of 69K per Lavella Lemons, RN per Dr. Benay Spice.

## 2016-03-12 NOTE — Patient Instructions (Signed)
[place] Discharge Instructions for Patients Receiving Chemotherapy  Today you received the following chemotherapy agents: velcade.  To help prevent nausea and vomiting after your treatment, we encourage you to take your nausea medication: zofran 8mg  every 8hrs as needed.    If you develop nausea and vomiting that is not controlled by your nausea medication, call the clinic.   BELOW ARE SYMPTOMS THAT SHOULD BE REPORTED IMMEDIATELY:  *FEVER GREATER THAN 100.5 F  *CHILLS WITH OR WITHOUT FEVER  NAUSEA AND VOMITING THAT IS NOT CONTROLLED WITH YOUR NAUSEA MEDICATION  *UNUSUAL SHORTNESS OF BREATH  *UNUSUAL BRUISING OR BLEEDING  TENDERNESS IN MOUTH AND THROAT WITH OR WITHOUT PRESENCE OF ULCERS  *URINARY PROBLEMS  *BOWEL PROBLEMS  UNUSUAL RASH Items with * indicate a potential emergency and should be followed up as soon as possible.  Feel free to call the clinic you have any questions or concerns. The clinic phone number is [phone].  Please show the [place] at check-in to the Emergency Department and triage nurse.

## 2016-03-18 ENCOUNTER — Other Ambulatory Visit: Payer: Self-pay | Admitting: Oncology

## 2016-03-20 ENCOUNTER — Other Ambulatory Visit: Payer: Self-pay | Admitting: *Deleted

## 2016-03-20 DIAGNOSIS — C9002 Multiple myeloma in relapse: Secondary | ICD-10-CM

## 2016-03-20 MED ORDER — ACYCLOVIR 400 MG PO TABS: 400.0000 mg | ORAL_TABLET | Freq: Two times a day (BID) | ORAL | Status: DC

## 2016-03-24 ENCOUNTER — Other Ambulatory Visit: Payer: Self-pay | Admitting: Oncology

## 2016-03-25 ENCOUNTER — Other Ambulatory Visit: Payer: Self-pay | Admitting: Oncology

## 2016-03-26 ENCOUNTER — Other Ambulatory Visit: Payer: Self-pay | Admitting: *Deleted

## 2016-03-26 ENCOUNTER — Ambulatory Visit (HOSPITAL_BASED_OUTPATIENT_CLINIC_OR_DEPARTMENT_OTHER): Payer: Medicare Other

## 2016-03-26 ENCOUNTER — Ambulatory Visit (HOSPITAL_BASED_OUTPATIENT_CLINIC_OR_DEPARTMENT_OTHER): Payer: Medicare Other | Admitting: Oncology

## 2016-03-26 ENCOUNTER — Other Ambulatory Visit (HOSPITAL_BASED_OUTPATIENT_CLINIC_OR_DEPARTMENT_OTHER): Payer: Medicare Other

## 2016-03-26 ENCOUNTER — Telehealth: Payer: Self-pay | Admitting: Oncology

## 2016-03-26 VITALS — BP 143/74 | HR 84 | Temp 98.0°F | Resp 18 | Ht 68.0 in | Wt 178.2 lb

## 2016-03-26 DIAGNOSIS — C9000 Multiple myeloma not having achieved remission: Secondary | ICD-10-CM | POA: Diagnosis not present

## 2016-03-26 DIAGNOSIS — I10 Essential (primary) hypertension: Secondary | ICD-10-CM

## 2016-03-26 DIAGNOSIS — Z5112 Encounter for antineoplastic immunotherapy: Secondary | ICD-10-CM | POA: Diagnosis not present

## 2016-03-26 DIAGNOSIS — C9002 Multiple myeloma in relapse: Secondary | ICD-10-CM

## 2016-03-26 DIAGNOSIS — D6959 Other secondary thrombocytopenia: Secondary | ICD-10-CM

## 2016-03-26 DIAGNOSIS — R1310 Dysphagia, unspecified: Secondary | ICD-10-CM | POA: Diagnosis not present

## 2016-03-26 LAB — COMPREHENSIVE METABOLIC PANEL
ALBUMIN: 3.8 g/dL (ref 3.5–5.0)
ALK PHOS: 63 U/L (ref 40–150)
ALT: 22 U/L (ref 0–55)
AST: 25 U/L (ref 5–34)
Anion Gap: 8 mEq/L (ref 3–11)
BILIRUBIN TOTAL: 1.06 mg/dL (ref 0.20–1.20)
BUN: 15.2 mg/dL (ref 7.0–26.0)
CALCIUM: 10.5 mg/dL — AB (ref 8.4–10.4)
CO2: 26 mEq/L (ref 22–29)
CREATININE: 1.1 mg/dL (ref 0.7–1.3)
Chloride: 96 mEq/L — ABNORMAL LOW (ref 98–109)
EGFR: 63 mL/min/{1.73_m2} — ABNORMAL LOW (ref >90)
GLUCOSE: 110 mg/dL (ref 70–140)
POTASSIUM: 3.7 meq/L (ref 3.5–5.1)
SODIUM: 130 meq/L — AB (ref 136–145)
TOTAL PROTEIN: 6 g/dL — AB (ref 6.4–8.3)

## 2016-03-26 LAB — CBC WITH DIFFERENTIAL/PLATELET
BASO%: 0.2 % (ref 0.0–2.0)
BASOS ABS: 0 10*3/uL (ref 0.0–0.1)
EOS ABS: 0 10*3/uL (ref 0.0–0.5)
EOS%: 0.7 % (ref 0.0–7.0)
HEMATOCRIT: 32.1 % — AB (ref 38.4–49.9)
HEMOGLOBIN: 11.2 g/dL — AB (ref 13.0–17.1)
LYMPH#: 0.4 10*3/uL — AB (ref 0.9–3.3)
LYMPH%: 8 % — ABNORMAL LOW (ref 14.0–49.0)
MCH: 35 pg — AB (ref 27.2–33.4)
MCHC: 35 g/dL (ref 32.0–36.0)
MCV: 100.1 fL — ABNORMAL HIGH (ref 79.3–98.0)
MONO#: 0.5 10*3/uL (ref 0.1–0.9)
MONO%: 8.7 % (ref 0.0–14.0)
NEUT%: 82.4 % — ABNORMAL HIGH (ref 39.0–75.0)
NEUTROS ABS: 4.4 10*3/uL (ref 1.5–6.5)
Platelets: 99 10*3/uL — ABNORMAL LOW (ref 140–400)
RBC: 3.2 10*6/uL — ABNORMAL LOW (ref 4.20–5.82)
RDW: 15.7 % — AB (ref 11.0–14.6)
WBC: 5.3 10*3/uL (ref 4.0–10.3)

## 2016-03-26 MED ORDER — BORTEZOMIB CHEMO SQ INJECTION 3.5 MG (2.5MG/ML)
1.3000 mg/m2 | Freq: Once | INTRAMUSCULAR | Status: AC
  Administered 2016-03-26: 2.5 mg via SUBCUTANEOUS
  Filled 2016-03-26: qty 2.5

## 2016-03-26 MED ORDER — ONDANSETRON HCL 8 MG PO TABS
ORAL_TABLET | ORAL | Status: AC
  Filled 2016-03-26: qty 1

## 2016-03-26 MED ORDER — ONDANSETRON HCL 8 MG PO TABS
8.0000 mg | ORAL_TABLET | Freq: Once | ORAL | Status: AC
  Administered 2016-03-26: 8 mg via ORAL

## 2016-03-26 MED ORDER — CYCLOPHOSPHAMIDE 50 MG PO TABS: 500.0000 mg | ORAL_TABLET | ORAL | Status: DC

## 2016-03-26 NOTE — Telephone Encounter (Signed)
Gave and printed appt sched and avs for pt for April adn May °

## 2016-03-26 NOTE — Progress Notes (Signed)
Verbal consent gien by Dr. Benay Spice to treat pt today with platelets count at 99

## 2016-03-26 NOTE — Patient Instructions (Signed)
[place] Discharge Instructions for Patients Receiving Chemotherapy  Today you received the following chemotherapy agents: velcade.  To help prevent nausea and vomiting after your treatment, we encourage you to take your nausea medication: zofran 8mg  every 8hrs as needed.    If you develop nausea and vomiting that is not controlled by your nausea medication, call the clinic.   BELOW ARE SYMPTOMS THAT SHOULD BE REPORTED IMMEDIATELY:  *FEVER GREATER THAN 100.5 F  *CHILLS WITH OR WITHOUT FEVER  NAUSEA AND VOMITING THAT IS NOT CONTROLLED WITH YOUR NAUSEA MEDICATION  *UNUSUAL SHORTNESS OF BREATH  *UNUSUAL BRUISING OR BLEEDING  TENDERNESS IN MOUTH AND THROAT WITH OR WITHOUT PRESENCE OF ULCERS  *URINARY PROBLEMS  *BOWEL PROBLEMS  UNUSUAL RASH Items with * indicate a potential emergency and should be followed up as soon as possible.  Feel free to call the clinic you have any questions or concerns. The clinic phone number is [phone].  Please show the [place] at check-in to the Emergency Department and triage nurse.

## 2016-03-26 NOTE — Progress Notes (Signed)
[place] OFFICE PROGRESS NOTE   Diagnosis: Multiple myeloma  INTERVAL HISTORY:   Mr. Hosea returns as scheduled. He continues weekly Velcade/Cytoxan/Decadron on a 3 out of 4 week schedule. He is due to begin another cycle today. He complains of solid dysphagia. This occurred several years ago and he underwent an esophagus dilation.  Objective:  Vital signs in last 24 hours:  Blood pressure 143/74, pulse 84, temperature 98 F (36.7 C), temperature source Oral, resp. rate 18, height 5' 8"  (1.727 m), weight 178 lb 3.2 oz (80.831 kg), SpO2 98 %.    HEENT: No thrush or ulcers Resp: Lungs clear bilaterally Cardio: Regular rate and rhythm GI: No hepatosplenomegaly Vascular: No leg edema   Lab Results:  Lab Results  Component Value Date   WBC 5.3 03/26/2016   HGB 11.2* 03/26/2016   HCT 32.1* 03/26/2016   MCV 100.1* 03/26/2016   PLT 99* 03/26/2016   NEUTROABS 4.4 03/26/2016   Lambda free light chains on 02/27/2016-67.26  Medications: I have reviewed the patient's current medications.  Assessment/Plan: 1. Multiple myeloma: The serum free light chains were elevated 08/29/2009. A CT of the chest 08/31/2009 showed a new pleural mass consistent with progression of multiple myeloma. He completed 12 cycles of Revlimid/Decadron. The serum free light chains were improved to 4.34 on 12/29/2009. A restaging CT of the chest 02/19/2010 showed resolution of right axillary lymphadenopathy and a pleural-based mass. The serum free lambda light chains were again elevated.  Salvage Revlimid/Decadron initiated 12/08/2014  Cycle 2 01/05/2015  Serum light chains improved 01/24/2015  Cycle 3 02/01/2015  Cycle 4 03/02/2015  Cycle 5 03/30/2015  Cycle 6 04/27/2015  Cycle 7 05/25/2015  Cycle 8 06/22/2015  Cycle 9 07/20/2015  Cycle 10 08/18/2015  Changed to maintenance Revlimid beginning 09/23/2015  Progressive anemia and Elevated serum free light chains  11/13/2015  Changed to weekly Cytoxan/Velcade/Decadron beginning 11/21/2015, changed to 3/4 week schedule beginning 01/02/2016  Serum light chains improved on 02/27/2016 2.History of back pain, likely related to the pleural-based mass at the right chest, resolved. 2. Right axillary/subpectoral lymphadenopathy on a CT of the chest 08/31/2009, likely related to multiple myeloma, resolved. 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 He is maintained on every three-month Zometa. 4. Pancytopenia secondary to Revlimid and multiple myeloma. He has persistent mild thrombocytopenia. 5. Hypertension. 6. Status post hip replacement. 7. History of back surgery. 8. History of trigeminal neuralgia. 9. Status post removal of a lipoma from the right axilla in February 2010. 10. 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 11. Esophageal reflux disease, followed by Dr. Janace Hoard.  12. Esophageal stricture, status post an evaluation by Dr. Henrene Pastor. 13. Right vocal cord lesion, status post a biopsy by Dr. 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 He completed radiation under the direction of Dr. Valere Dross on 03/15/2011. 14. Admission with pneumonia 03/26/2010. 15. History of Atrial flutter/fibrillation while hospitalized August 2011. He is followed by Dr. Percival Spanish. 16. Elevated prostate specific antigen, status post a biopsy 01/23/2011. He was found to have a Gleason 6 cancer in 10% of the cores. He is followed with an observation approach. He is now followed by Dr. Diona Fanti. 17. History of Mild hypercalcemia  18. Fall with a left wrist fracture June 2015  19. Right chest wall pain secondary to a right second rib plasmacytoma confirmed on a CT 11/28/2014, resolved 20. History of Hyponatremia-improved 21. Thrombocytopenia secondary chemotherapy   Disposition:  Mr. Eichelberger appears unchanged. He is  responding to the Cytoxan/Velcade/Decadron. He  will begin another cycle today. He continues every three-month Zometa.  I will refer him to Dr. Henrene Pastor to evaluate the dysphagia. He may need a repeat endoscopy/esophagus dilation.  Mr. Poth will return for an office visit in one month.  Betsy Coder, MD  03/26/2016  8:34 AM

## 2016-03-27 LAB — KAPPA/LAMBDA LIGHT CHAINS
IG KAPPA FREE LIGHT CHAIN: 7.5 mg/L (ref 3.30–19.40)
Ig Lambda Free Light Chain: 58.36 mg/L — ABNORMAL HIGH (ref 5.71–26.30)
KAPPA/LAMBDA FLC RATIO: 0.13 — AB (ref 0.26–1.65)

## 2016-03-31 ENCOUNTER — Other Ambulatory Visit: Payer: Self-pay | Admitting: Oncology

## 2016-04-02 ENCOUNTER — Other Ambulatory Visit (HOSPITAL_BASED_OUTPATIENT_CLINIC_OR_DEPARTMENT_OTHER): Payer: Medicare Other

## 2016-04-02 ENCOUNTER — Ambulatory Visit (HOSPITAL_BASED_OUTPATIENT_CLINIC_OR_DEPARTMENT_OTHER): Payer: Medicare Other

## 2016-04-02 VITALS — HR 89 | Temp 97.7°F | Resp 20

## 2016-04-02 DIAGNOSIS — C9002 Multiple myeloma in relapse: Secondary | ICD-10-CM | POA: Diagnosis not present

## 2016-04-02 DIAGNOSIS — Z5112 Encounter for antineoplastic immunotherapy: Secondary | ICD-10-CM | POA: Diagnosis not present

## 2016-04-02 LAB — CBC WITH DIFFERENTIAL/PLATELET
BASO%: 0.1 % (ref 0.0–2.0)
Basophils Absolute: 0 10*3/uL (ref 0.0–0.1)
EOS ABS: 0 10*3/uL (ref 0.0–0.5)
EOS%: 1 % (ref 0.0–7.0)
HCT: 33.6 % — ABNORMAL LOW (ref 38.4–49.9)
HEMOGLOBIN: 11.5 g/dL — AB (ref 13.0–17.1)
LYMPH%: 11 % — AB (ref 14.0–49.0)
MCH: 34.9 pg — ABNORMAL HIGH (ref 27.2–33.4)
MCHC: 34.3 g/dL (ref 32.0–36.0)
MCV: 102 fL — AB (ref 79.3–98.0)
MONO#: 0.7 10*3/uL (ref 0.1–0.9)
MONO%: 13.9 % (ref 0.0–14.0)
NEUT%: 74 % (ref 39.0–75.0)
NEUTROS ABS: 3.5 10*3/uL (ref 1.5–6.5)
PLATELETS: 82 10*3/uL — AB (ref 140–400)
RBC: 3.3 10*6/uL — ABNORMAL LOW (ref 4.20–5.82)
RDW: 15.2 % — AB (ref 11.0–14.6)
WBC: 4.7 10*3/uL (ref 4.0–10.3)
lymph#: 0.5 10*3/uL — ABNORMAL LOW (ref 0.9–3.3)

## 2016-04-02 MED ORDER — BORTEZOMIB CHEMO SQ INJECTION 3.5 MG (2.5MG/ML)
1.3000 mg/m2 | Freq: Once | INTRAMUSCULAR | Status: AC
  Administered 2016-04-02: 2.5 mg via SUBCUTANEOUS
  Filled 2016-04-02: qty 2.5

## 2016-04-02 MED ORDER — ONDANSETRON HCL 8 MG PO TABS
ORAL_TABLET | ORAL | Status: AC
  Filled 2016-04-02: qty 1

## 2016-04-02 MED ORDER — ONDANSETRON HCL 8 MG PO TABS
8.0000 mg | ORAL_TABLET | Freq: Once | ORAL | Status: AC
  Administered 2016-04-02: 8 mg via ORAL

## 2016-04-02 NOTE — Progress Notes (Signed)
Ok to treat with platelets of 82 per Dr. Benay Spice.

## 2016-04-02 NOTE — Patient Instructions (Signed)
[place] Discharge Instructions for Patients Receiving Chemotherapy  Today you received the following chemotherapy agents: velcade.  To help prevent nausea and vomiting after your treatment, we encourage you to take your nausea medication: zofran 8mg  every 8hrs as needed.    If you develop nausea and vomiting that is not controlled by your nausea medication, call the clinic.   BELOW ARE SYMPTOMS THAT SHOULD BE REPORTED IMMEDIATELY:  *FEVER GREATER THAN 100.5 F  *CHILLS WITH OR WITHOUT FEVER  NAUSEA AND VOMITING THAT IS NOT CONTROLLED WITH YOUR NAUSEA MEDICATION  *UNUSUAL SHORTNESS OF BREATH  *UNUSUAL BRUISING OR BLEEDING  TENDERNESS IN MOUTH AND THROAT WITH OR WITHOUT PRESENCE OF ULCERS  *URINARY PROBLEMS  *BOWEL PROBLEMS  UNUSUAL RASH Items with * indicate a potential emergency and should be followed up as soon as possible.  Feel free to call the clinic you have any questions or concerns. The clinic phone number is [phone].  Please show the [place] at check-in to the Emergency Department and triage nurse.

## 2016-04-07 ENCOUNTER — Other Ambulatory Visit: Payer: Self-pay | Admitting: Oncology

## 2016-04-09 ENCOUNTER — Other Ambulatory Visit (HOSPITAL_BASED_OUTPATIENT_CLINIC_OR_DEPARTMENT_OTHER): Payer: Medicare Other

## 2016-04-09 ENCOUNTER — Ambulatory Visit (HOSPITAL_BASED_OUTPATIENT_CLINIC_OR_DEPARTMENT_OTHER): Payer: Medicare Other

## 2016-04-09 ENCOUNTER — Other Ambulatory Visit: Payer: Self-pay | Admitting: *Deleted

## 2016-04-09 VITALS — BP 131/73 | HR 91 | Temp 97.8°F | Resp 18

## 2016-04-09 DIAGNOSIS — C9002 Multiple myeloma in relapse: Secondary | ICD-10-CM

## 2016-04-09 DIAGNOSIS — Z5112 Encounter for antineoplastic immunotherapy: Secondary | ICD-10-CM | POA: Diagnosis not present

## 2016-04-09 LAB — CBC WITH DIFFERENTIAL/PLATELET
BASO%: 0 % (ref 0.0–2.0)
BASOS ABS: 0 10*3/uL (ref 0.0–0.1)
EOS ABS: 0.1 10*3/uL (ref 0.0–0.5)
EOS%: 1.3 % (ref 0.0–7.0)
HEMATOCRIT: 31.5 % — AB (ref 38.4–49.9)
HEMOGLOBIN: 11.3 g/dL — AB (ref 13.0–17.1)
LYMPH%: 10.9 % — ABNORMAL LOW (ref 14.0–49.0)
MCH: 35.3 pg — AB (ref 27.2–33.4)
MCHC: 35.9 g/dL (ref 32.0–36.0)
MCV: 98.4 fL — AB (ref 79.3–98.0)
MONO#: 0.5 10*3/uL (ref 0.1–0.9)
MONO%: 9.8 % (ref 0.0–14.0)
NEUT#: 3.6 10*3/uL (ref 1.5–6.5)
NEUT%: 78 % — AB (ref 39.0–75.0)
Platelets: 78 10*3/uL — ABNORMAL LOW (ref 140–400)
RBC: 3.2 10*6/uL — ABNORMAL LOW (ref 4.20–5.82)
RDW: 14.5 % (ref 11.0–14.6)
WBC: 4.6 10*3/uL (ref 4.0–10.3)
lymph#: 0.5 10*3/uL — ABNORMAL LOW (ref 0.9–3.3)

## 2016-04-09 LAB — COMPREHENSIVE METABOLIC PANEL
ALT: 27 U/L (ref 0–55)
AST: 21 U/L (ref 5–34)
Albumin: 3.9 g/dL (ref 3.5–5.0)
Alkaline Phosphatase: 61 U/L (ref 40–150)
Anion Gap: 6 mEq/L (ref 3–11)
BUN: 18.4 mg/dL (ref 7.0–26.0)
CALCIUM: 10.4 mg/dL (ref 8.4–10.4)
CO2: 31 mEq/L — ABNORMAL HIGH (ref 22–29)
Chloride: 95 mEq/L — ABNORMAL LOW (ref 98–109)
Creatinine: 1.2 mg/dL (ref 0.7–1.3)
EGFR: 56 mL/min/{1.73_m2} — AB (ref >90)
Glucose: 106 mg/dl (ref 70–140)
POTASSIUM: 3.6 meq/L (ref 3.5–5.1)
SODIUM: 133 meq/L — AB (ref 136–145)
Total Bilirubin: 1.04 mg/dL (ref 0.20–1.20)
Total Protein: 6 g/dL — ABNORMAL LOW (ref 6.4–8.3)

## 2016-04-09 MED ORDER — ONDANSETRON HCL 8 MG PO TABS
ORAL_TABLET | ORAL | Status: AC
  Filled 2016-04-09: qty 1

## 2016-04-09 MED ORDER — BORTEZOMIB CHEMO SQ INJECTION 3.5 MG (2.5MG/ML)
1.3000 mg/m2 | Freq: Once | INTRAMUSCULAR | Status: AC
  Administered 2016-04-09: 2.5 mg via SUBCUTANEOUS
  Filled 2016-04-09: qty 2.5

## 2016-04-09 MED ORDER — ONDANSETRON HCL 8 MG PO TABS
8.0000 mg | ORAL_TABLET | Freq: Once | ORAL | Status: AC
  Administered 2016-04-09: 8 mg via ORAL

## 2016-04-09 NOTE — Progress Notes (Signed)
Okay to treat despite platelet = 78, per Dr. Benay Spice.

## 2016-04-09 NOTE — Patient Instructions (Signed)
[facility] Discharge Instructions for Patients Receiving Chemotherapy  Today you received the following chemotherapy agents:  Velcade  To help prevent nausea and vomiting after your treatment, we encourage you to take your nausea medication as prescribed.   If you develop nausea and vomiting that is not controlled by your nausea medication, call the clinic.   BELOW ARE SYMPTOMS THAT SHOULD BE REPORTED IMMEDIATELY:  *FEVER GREATER THAN 100.5 F  *CHILLS WITH OR WITHOUT FEVER  NAUSEA AND VOMITING THAT IS NOT CONTROLLED WITH YOUR NAUSEA MEDICATION  *UNUSUAL SHORTNESS OF BREATH  *UNUSUAL BRUISING OR BLEEDING  TENDERNESS IN MOUTH AND THROAT WITH OR WITHOUT PRESENCE OF ULCERS  *URINARY PROBLEMS  *BOWEL PROBLEMS  UNUSUAL RASH Items with * indicate a potential emergency and should be followed up as soon as possible.  Feel free to call the clinic you have any questions or concerns. The clinic phone number is [phone].  Please show the CHEMO ALERT CARD at check-in to the Emergency Department and triage nurse.   

## 2016-04-21 ENCOUNTER — Other Ambulatory Visit: Payer: Self-pay | Admitting: Oncology

## 2016-04-23 ENCOUNTER — Telehealth: Payer: Self-pay

## 2016-04-23 ENCOUNTER — Telehealth: Payer: Self-pay | Admitting: *Deleted

## 2016-04-23 ENCOUNTER — Ambulatory Visit (HOSPITAL_BASED_OUTPATIENT_CLINIC_OR_DEPARTMENT_OTHER): Payer: Medicare Other | Admitting: Oncology

## 2016-04-23 ENCOUNTER — Ambulatory Visit (HOSPITAL_BASED_OUTPATIENT_CLINIC_OR_DEPARTMENT_OTHER): Payer: Medicare Other

## 2016-04-23 ENCOUNTER — Telehealth: Payer: Self-pay | Admitting: Oncology

## 2016-04-23 ENCOUNTER — Other Ambulatory Visit (HOSPITAL_BASED_OUTPATIENT_CLINIC_OR_DEPARTMENT_OTHER): Payer: Medicare Other

## 2016-04-23 ENCOUNTER — Other Ambulatory Visit: Payer: Self-pay | Admitting: *Deleted

## 2016-04-23 VITALS — BP 119/58 | HR 82 | Temp 97.8°F | Resp 17 | Ht 68.0 in | Wt 174.1 lb

## 2016-04-23 DIAGNOSIS — R1310 Dysphagia, unspecified: Secondary | ICD-10-CM

## 2016-04-23 DIAGNOSIS — M542 Cervicalgia: Secondary | ICD-10-CM

## 2016-04-23 DIAGNOSIS — C9002 Multiple myeloma in relapse: Secondary | ICD-10-CM

## 2016-04-23 DIAGNOSIS — C9000 Multiple myeloma not having achieved remission: Secondary | ICD-10-CM | POA: Diagnosis not present

## 2016-04-23 DIAGNOSIS — Z5112 Encounter for antineoplastic immunotherapy: Secondary | ICD-10-CM

## 2016-04-23 LAB — CBC WITH DIFFERENTIAL/PLATELET
BASO%: 0 % (ref 0.0–2.0)
Basophils Absolute: 0 10*3/uL (ref 0.0–0.1)
EOS ABS: 0 10*3/uL (ref 0.0–0.5)
EOS%: 0.9 % (ref 0.0–7.0)
HCT: 31.2 % — ABNORMAL LOW (ref 38.4–49.9)
HGB: 11.1 g/dL — ABNORMAL LOW (ref 13.0–17.1)
LYMPH%: 13.6 % — AB (ref 14.0–49.0)
MCH: 35.2 pg — ABNORMAL HIGH (ref 27.2–33.4)
MCHC: 35.6 g/dL (ref 32.0–36.0)
MCV: 99 fL — AB (ref 79.3–98.0)
MONO#: 0.8 10*3/uL (ref 0.1–0.9)
MONO%: 17.6 % — ABNORMAL HIGH (ref 0.0–14.0)
NEUT#: 2.9 10*3/uL (ref 1.5–6.5)
NEUT%: 67.9 % (ref 39.0–75.0)
PLATELETS: 96 10*3/uL — AB (ref 140–400)
RBC: 3.15 10*6/uL — AB (ref 4.20–5.82)
RDW: 15.1 % — ABNORMAL HIGH (ref 11.0–14.6)
WBC: 4.3 10*3/uL (ref 4.0–10.3)
lymph#: 0.6 10*3/uL — ABNORMAL LOW (ref 0.9–3.3)

## 2016-04-23 LAB — COMPREHENSIVE METABOLIC PANEL
ALT: 28 U/L (ref 0–55)
ANION GAP: 7 meq/L (ref 3–11)
AST: 25 U/L (ref 5–34)
Albumin: 3.8 g/dL (ref 3.5–5.0)
Alkaline Phosphatase: 61 U/L (ref 40–150)
BILIRUBIN TOTAL: 1.15 mg/dL (ref 0.20–1.20)
BUN: 10.9 mg/dL (ref 7.0–26.0)
CALCIUM: 9.7 mg/dL (ref 8.4–10.4)
CHLORIDE: 97 meq/L — AB (ref 98–109)
CO2: 25 meq/L (ref 22–29)
Creatinine: 1 mg/dL (ref 0.7–1.3)
EGFR: 67 mL/min/{1.73_m2} — AB (ref >90)
Glucose: 93 mg/dl (ref 70–140)
Potassium: 3.7 mEq/L (ref 3.5–5.1)
Sodium: 129 mEq/L — ABNORMAL LOW (ref 136–145)
TOTAL PROTEIN: 6.2 g/dL — AB (ref 6.4–8.3)

## 2016-04-23 MED ORDER — CYCLOPHOSPHAMIDE 50 MG PO TABS: 500.0000 mg | ORAL_TABLET | ORAL | Status: DC

## 2016-04-23 MED ORDER — ONDANSETRON HCL 8 MG PO TABS
8.0000 mg | ORAL_TABLET | Freq: Once | ORAL | Status: AC
  Administered 2016-04-23: 8 mg via ORAL

## 2016-04-23 MED ORDER — BORTEZOMIB CHEMO SQ INJECTION 3.5 MG (2.5MG/ML)
1.3000 mg/m2 | Freq: Once | INTRAMUSCULAR | Status: AC
  Administered 2016-04-23: 2.5 mg via SUBCUTANEOUS
  Filled 2016-04-23: qty 2.5

## 2016-04-23 MED ORDER — ONDANSETRON HCL 8 MG PO TABS
ORAL_TABLET | ORAL | Status: AC
  Filled 2016-04-23: qty 1

## 2016-04-23 NOTE — Telephone Encounter (Signed)
Moved appt from 5/30 to 5/31 due to no available

## 2016-04-23 NOTE — Telephone Encounter (Signed)
per pof to sch pt appt-sent MW emailto sch trmt-pt to getupdated copy b4 leaving** °

## 2016-04-23 NOTE — Patient Instructions (Signed)
[facility] Discharge Instructions for Patients Receiving Chemotherapy  Today you received the following chemotherapy agents:  Velcade  To help prevent nausea and vomiting after your treatment, we encourage you to take your nausea medication as prescribed.   If you develop nausea and vomiting that is not controlled by your nausea medication, call the clinic.   BELOW ARE SYMPTOMS THAT SHOULD BE REPORTED IMMEDIATELY:  *FEVER GREATER THAN 100.5 F  *CHILLS WITH OR WITHOUT FEVER  NAUSEA AND VOMITING THAT IS NOT CONTROLLED WITH YOUR NAUSEA MEDICATION  *UNUSUAL SHORTNESS OF BREATH  *UNUSUAL BRUISING OR BLEEDING  TENDERNESS IN MOUTH AND THROAT WITH OR WITHOUT PRESENCE OF ULCERS  *URINARY PROBLEMS  *BOWEL PROBLEMS  UNUSUAL RASH Items with * indicate a potential emergency and should be followed up as soon as possible.  Feel free to call the clinic you have any questions or concerns. The clinic phone number is [phone].  Please show the CHEMO ALERT CARD at check-in to the Emergency Department and triage nurse.   

## 2016-04-23 NOTE — Telephone Encounter (Signed)
-----   Message from Irene Shipper, MD sent at 04/23/2016  9:49 AM EDT ----- Vaughan Basta, please see the note below from Dr. Benay Spice. My schedule is booked this Thursday, May 18, but add this patient in at 10:30 AM. Thanks  ----- Message -----    From: Ladell Pier, MD    Sent: 04/23/2016   9:32 AM      To: Irene Shipper, MD  You have seen him before to dilate and esophagus stricture. He has dysphagia again. He is undergoing treatment for myeloma and appears to be responding.  You are scheduled to see him in approximately one month. Can you see him sooner?  Thanks,  [employer]

## 2016-04-23 NOTE — Telephone Encounter (Signed)
Per staff message and POF I have scheduled appts. Advised scheduler of appts and patient given schedule. JMW

## 2016-04-23 NOTE — Progress Notes (Signed)
[place] OFFICE PROGRESS NOTE   Diagnosis: Multiple myeloma  INTERVAL HISTORY:   Richard [name] returns as scheduled. He continues to have solid dysphagia. He has not seen Dr. Henrene Pastor. He reports pain at the upper posterior neck for the past several weeks. He feels like he "pinched a nerve ". The pain is worse when he turns the head to the left. He is scheduled to see Dr. Ellene Route.  He continues Cytoxan/Velcade/Decadron. Richard [name] is tolerating the treatment well.  Objective:  Vital signs in last 24 hours:  Blood pressure 119/58, pulse 82, temperature 97.8 F (36.6 C), temperature source Oral, resp. rate 17, height 5' 8"  (1.727 m), weight 174 lb 1.6 oz (78.971 kg), SpO2 98 %.    HEENT: No thrush or ulcers Resp: Lungs clear bilaterally Cardio: Regular rate and rhythm GI: No hepatosplenomegaly Vascular: No leg edema Musculoskeletal: No tenderness at the neck or occiput, the area of discomfort is at the high cervical spine/low occiput, increased discomfort when turning to the left  Lab Results:  Lab Results  Component Value Date   WBC 4.3 04/23/2016   HGB 11.1* 04/23/2016   HCT 31.2* 04/23/2016   MCV 99.0* 04/23/2016   PLT 96* 04/23/2016   NEUTROABS 2.9 04/23/2016   Free lambda light chains on 03/26/2016-58.36  Medications: I have reviewed the patient's current medications.  Assessment/Plan: 1. Multiple myeloma: The serum free light chains were elevated 08/29/2009. A CT of the chest 08/31/2009 showed a new pleural mass consistent with progression of multiple myeloma. He completed 12 cycles of Revlimid/Decadron. The serum free light chains were improved to 4.34 on 12/29/2009. A restaging CT of the chest 02/19/2010 showed resolution of right axillary lymphadenopathy and a pleural-based mass. The serum free lambda light chains were again elevated.  Salvage Revlimid/Decadron initiated 12/08/2014  Cycle 2 01/05/2015  Serum light chains improved  01/24/2015  Cycle 3 02/01/2015  Cycle 4 03/02/2015  Cycle 5 03/30/2015  Cycle 6 04/27/2015  Cycle 7 05/25/2015  Cycle 8 06/22/2015  Cycle 9 07/20/2015  Cycle 10 08/18/2015  Changed to maintenance Revlimid beginning 09/23/2015  Progressive anemia and Elevated serum free light chains 11/13/2015  Changed to weekly Cytoxan/Velcade/Decadron beginning 11/21/2015, changed to 3/4 week schedule beginning 01/02/2016  Serum light chains improved on 03/26/2016 2.History of back pain, likely related to the pleural-based mass at the right chest, resolved. 2. Right axillary/subpectoral lymphadenopathy on a CT of the chest 08/31/2009, likely related to multiple myeloma, resolved. 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 He is maintained on every three-month Zometa. 4. Pancytopenia secondary to Revlimid and multiple myeloma. He has persistent mild thrombocytopenia. 5. Hypertension. 6. Status post hip replacement. 7. History of back surgery. 8. History of trigeminal neuralgia. 9. Status post removal of a lipoma from the right axilla in February 2010. 10. 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 11. Esophageal reflux disease, followed by Dr. Janace Hoard.  12. Esophageal stricture, status post an evaluation by Dr. Henrene Pastor. 13. Right vocal cord lesion, status post a biopsy by Dr. 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 He completed radiation under the direction of Dr. Valere Dross on 03/15/2011. 14. Admission with pneumonia 03/26/2010. 15. History of Atrial flutter/fibrillation while hospitalized August 2011. He is followed by Dr. Percival Spanish. 16. Elevated prostate specific antigen, status post a biopsy 01/23/2011. He was found to have a Gleason 6 cancer in 10% of the cores. He is followed with an observation approach. He is now followed by Dr. Diona Fanti.  17. History of Mild hypercalcemia  18. Fall with a left wrist fracture June 2015   19. Right chest wall pain secondary to a right second rib plasmacytoma confirmed on a CT 11/28/2014, resolved 20. History of Hyponatremia-improved 21. Thrombocytopenia secondary chemotherapy   Disposition:  Richard [name] appears well. I doubt the neck discomfort is related to myeloma, but this is possible. He is scheduled to see Dr. Ellene Route within the next few weeks. He will contact us for increased pain and we will arrange for imaging.  I will contact Dr. Henrene Pastor to see if he can see him sooner to evaluate the dysphagia.  Richard [name] continues Cytoxan/Velcade/Decadron for treatment of myeloma. The serum free light chains continue to improve. He will receive Zometa when he returns next month.  Betsy Coder, MD  04/23/2016  9:29 AM

## 2016-04-23 NOTE — Telephone Encounter (Signed)
Pt scheduled to see Dr. Henrene Pastor 04/25/17@10 :30am. Pts daughter aware of appt.

## 2016-04-24 LAB — KAPPA/LAMBDA LIGHT CHAINS
Ig Kappa Free Light Chain: 8.51 mg/L (ref 3.30–19.40)
Ig Lambda Free Light Chain: 136.76 mg/L — ABNORMAL HIGH (ref 5.71–26.30)
KAPPA/LAMBDA FLC RATIO: 0.06 — AB (ref 0.26–1.65)

## 2016-04-25 ENCOUNTER — Encounter: Payer: Self-pay | Admitting: Internal Medicine

## 2016-04-25 ENCOUNTER — Ambulatory Visit (INDEPENDENT_AMBULATORY_CARE_PROVIDER_SITE_OTHER): Payer: Medicare Other | Admitting: Internal Medicine

## 2016-04-25 VITALS — BP 124/62 | HR 70 | Ht 68.0 in | Wt 176.0 lb

## 2016-04-25 DIAGNOSIS — K219 Gastro-esophageal reflux disease without esophagitis: Secondary | ICD-10-CM

## 2016-04-25 DIAGNOSIS — R1310 Dysphagia, unspecified: Secondary | ICD-10-CM

## 2016-04-25 DIAGNOSIS — K222 Esophageal obstruction: Secondary | ICD-10-CM | POA: Diagnosis not present

## 2016-04-25 DIAGNOSIS — D696 Thrombocytopenia, unspecified: Secondary | ICD-10-CM

## 2016-04-25 NOTE — Patient Instructions (Signed)

## 2016-04-25 NOTE — Progress Notes (Signed)
HISTORY OF PRESENT ILLNESS:  Richard [name] is a 82 y.o. male with multiple medical problems as listed below. Currently being treated for multiple myeloma by Dr. Benay Spice who refers the patient today for evaluation of intermittent solid food dysphagia. Patient has a history of GERD complicated by peptic stricture for which I performed esophageal dilation to a maximal diameter of 18 mm in October 2011. He states that this resolved his issues with dysphagia. His medication list states he is taking pantoprazole 40 mg daily, though the patient cannot confirm this. He denies reflux symptoms. He does report intermittent solid food dysphagia and had transient food impaction with chicken sandwich about a month ago. 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 Did not require surgery. Received several weeks of radiotherapy with no recurrent problems. Current issues with dysphagia have been prominent for him over the past several months. He is chronically thrombocytopenic with most recent platelet count 96,000. Other blood counts are okay. He is no longer on anticoagulation as he had in the past.  REVIEW OF SYSTEMS:  All non-GI ROS negative except for arthritis  Past Medical History  Diagnosis Date  . Multiple myeloma   . Hypertension   . Dyslipidemia   . Arrhythmia     atrial fibrillation  . Cholelithiasis   . GERD (gastroesophageal reflux disease)   . Hiatal hernia   . Esophageal stricture   . Anemia   . History of BPH   . History of radiation therapy 01/29/11 -03/15/11    larynx 6600 cGy 33 sessions  . 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 Prostate cancer ([place]) 01/23/11    Gleason 3+3=6  . Arthritis     Past Surgical History  Procedure Laterality Date  . Hip surgery    . Back surgery    . Trigeminal neuralgia    . Appendectomy    . Tonsillectomy    . Right chest wall resection      of  second ,third,and fourth rib  . Esophageal dilation      Social History Richard [name]  reports that he quit smoking about 22 years ago. His smoking use included Cigarettes. He does not have any smokeless tobacco history on file. He reports that he drinks alcohol. He reports that he does not use illicit drugs.  family history includes Heart attack in his father; Multiple myeloma in his mother.  Allergies  Allergen Reactions  . Celebrex [Celecoxib] Other (See Comments)    GI bleeding  . Coricidin D Cold-Flu-Sinus  [Chlorphen-Pe-Acetaminophen] Hives       PHYSICAL EXAMINATION: Vital signs: BP 124/62 mmHg  Pulse 70  Ht 5' 8"  (1.727 m)  Wt 176 lb (79.833 kg)  BMI 26.77 kg/m2  Constitutional: Pleasant elderly male, generally well-appearing for his age, no acute distress Psychiatric: alert and oriented x3, cooperative. Somewhat stuttering speech Eyes: extraocular movements intact, anicteric, conjunctiva pink Mouth: oral pharynx moist, no lesions. Posterior pharynx reveals no abnormalities with good airway Neck: supple without thyromegaly Lymph: no supraclavicular. lymphadenopathy Cardiovascular: heart regular rate and rhythm, no murmur Lungs: clear to auscultation bilaterally Abdomen: soft, nontender, nondistended, no obvious ascites, no peritoneal signs, normal bowel sounds, no organomegaly. Midline diastases Rectal: Omitted Extremities: no clubbing, cyanosis, or lower extremity edema bilaterally Skin: Hypervascular facies malar region; no lesions on visible extremities Neuro: No focal deficits. Cranial nerves intact.  Normal DTRs   ASSESSMENT:  #1. Intermittent solid food dysphagia with transient food impaction. Likely secondary to known peptic stricture which has required dilation in the past #2. GERD. Apparently on PPI #3. Multiple medical problems. Significant but stable. #4. Thrombocytopenia. Acceptable level for procedural work   PLAN:  #1. Continue PPI #2. Schedule  upper endoscopy with esophageal dilation. The patient is high-risk given his age and comorbidities.The nature of the procedure, as well as the risks, benefits, and alternatives were carefully and thoroughly reviewed with the patient. Ample time for discussion and questions allowed. The patient understood, was satisfied, and agreed to proceed.

## 2016-04-26 ENCOUNTER — Encounter: Payer: Self-pay | Admitting: Internal Medicine

## 2016-04-26 ENCOUNTER — Ambulatory Visit (AMBULATORY_SURGERY_CENTER): Payer: Medicare Other | Admitting: Internal Medicine

## 2016-04-26 VITALS — BP 144/88 | HR 83 | Temp 97.8°F | Resp 20 | Ht 68.0 in | Wt 176.0 lb

## 2016-04-26 DIAGNOSIS — R1310 Dysphagia, unspecified: Secondary | ICD-10-CM

## 2016-04-26 DIAGNOSIS — K222 Esophageal obstruction: Secondary | ICD-10-CM

## 2016-04-26 DIAGNOSIS — K219 Gastro-esophageal reflux disease without esophagitis: Secondary | ICD-10-CM | POA: Diagnosis not present

## 2016-04-26 MED ORDER — OMEPRAZOLE 40 MG PO CPDR: 40.0000 mg | DELAYED_RELEASE_CAPSULE | Freq: Every day | ORAL | Status: DC

## 2016-04-26 MED ORDER — SODIUM CHLORIDE 0.9 % IV SOLN: 500.0000 mL | INTRAVENOUS | Status: DC

## 2016-04-26 NOTE — Progress Notes (Signed)
A/ox3 pleased with MAC, report to Wendy RN 

## 2016-04-26 NOTE — Progress Notes (Signed)
Called to room to assist during endoscopic procedure.  Patient ID and intended procedure confirmed with present staff. Received instructions for my participation in the procedure from the performing physician.  

## 2016-04-26 NOTE — Progress Notes (Signed)
Pt ate a breakfast bar that was dipped in chocolate and drank a cup of coffee this am at 5:15 am.  This was reported to Osvaldo Angst, CRNA.  He said we would post pone pt to 11:00 am this am.  He spoke with the pt and his daughter about the reason to post pone the EGD.  The pt's daughter does understand the importance of waiting.  Pt is a poor historian, so his daughter was at the bedside will pt's was checked in in the admitting room. maw

## 2016-04-26 NOTE — Progress Notes (Signed)
Dental advisory given to patient 

## 2016-04-26 NOTE — Patient Instructions (Addendum)
YOU HAD AN ENDOSCOPIC PROCEDURE TODAY AT [facility]:   Refer to the procedure report that was given to you for any specific questions about what was found during the examination.  If the procedure report does not answer your questions, please call your gastroenterologist to clarify.  If you requested that your care partner not be given the details of your procedure findings, then the procedure report has been included in a sealed envelope for you to review at your convenience later.  YOU SHOULD EXPECT: Some feelings of bloating in the abdomen. Passage of more gas than usual.  Walking can help get rid of the air that was put into your GI tract during the procedure and reduce the bloating. If you had a lower endoscopy (such as a colonoscopy or flexible sigmoidoscopy) you may notice spotting of blood in your stool or on the toilet paper. If you underwent a bowel prep for your procedure, you may not have a normal bowel movement for a few days.  Please Note:  You might notice some irritation and congestion in your nose or some drainage.  This is from the oxygen used during your procedure.  There is no need for concern and it should clear up in a day or so.  SYMPTOMS TO REPORT IMMEDIATELY:   Following upper endoscopy (EGD)  Vomiting of blood or coffee ground material  New chest pain or pain under the shoulder blades  Painful or persistently difficult swallowing  New shortness of breath  Fever of 100F or higher  Black, tarry-looking stools  For urgent or emergent issues, a gastroenterologist can be reached at any hour by calling [phone].   DIET: Your first meal following the procedure should be a small meal and then it is ok to progress to your normal diet. Heavy or fried foods are harder to digest and may make you feel nauseous or bloated.  Likewise, meals heavy in dairy and vegetables can increase bloating.  Drink plenty of fluids but you should avoid alcoholic beverages for  24 hours.  ACTIVITY:  You should plan to take it easy for the rest of today and you should NOT DRIVE or use heavy machinery until tomorrow (because of the sedation medicines used during the test).    FOLLOW UP: Our staff will call the number listed on your records the next business day following your procedure to check on you and address any questions or concerns that you may have regarding the information given to you following your procedure. If we do not reach you, we will leave a message.  However, if you are feeling well and you are not experiencing any problems, there is no need to return our call.  We will assume that you have returned to your regular daily activities without incident.  If any biopsies were taken you will be contacted by phone or by letter within the next 1-3 weeks.  Please call us at [phone] if you have not heard about the biopsies in 3 weeks.    SIGNATURES/CONFIDENTIALITY: You and/or your care partner have signed paperwork which will be entered into your electronic medical record.  These signatures attest to the fact that that the information above on your After Visit Summary has been reviewed and is understood.  Full responsibility of the confidentiality of this discharge information lies with you and/or your care-partner.  Please follow dilation instructions given, a handout is provided also. Please read esophagitis handout.

## 2016-04-26 NOTE — Progress Notes (Signed)
[name] daughter asked if she could leave and come back at 10:30 since the pt's procedure was postponed.  I left the pt's belongings bag under the stretcher.  When she returns, i will give her his bag of belongings.  She will check in with Bethena Roys at the front desk when she comes back. maw

## 2016-04-26 NOTE — Op Note (Signed)
[place] Patient Name: Richard [name] Procedure Date: 04/26/2016 11:46 AM MRN: [id number] Endoscopist: Docia Chuck. Henrene Pastor , MD Age: 82 Referring MD:  Date of Birth: Dec 12, 1932 Gender: Male Procedure:                Upper GI endoscopy, with [facility] dilation of the                            esophagus 44f Indications:              Dysphagia Medicines:                Monitored Anesthesia Care Procedure:                Pre-Anesthesia Assessment:                           - Prior to the procedure, a History and Physical                            was performed, and patient medications and                            allergies were reviewed. The patient's tolerance of                            previous anesthesia was also reviewed. The risks                            and benefits of the procedure and the sedation                            options and risks were discussed with the patient.                            All questions were answered, and informed consent                            was obtained. Prior Anticoagulants: The patient has                            taken no previous anticoagulant or antiplatelet                            agents. ASA Grade Assessment: III - A patient with                            severe systemic disease. After reviewing the risks                            and benefits, the patient was deemed in                            satisfactory condition to undergo the procedure.  After obtaining informed consent, the endoscope was                            passed under direct vision. Throughout the                            procedure, the patient's blood pressure, pulse, and                            oxygen saturations were monitored continuously. The                            Model GIF-[id number]) scope was introduced                            through the mouth, and advanced to the second part        of duodenum. The upper GI endoscopy was                            accomplished without difficulty. The patient                            tolerated the procedure well. Scope In: Scope Out: Findings:                 One mild benign-appearing, intrinsic stenosis was                            found. This measured 1.5 cm (inner diameter). The                            scope was withdrawn, post endoscopic examination.                            Dilation was performed with a Maloney dilator with                            no resistance at 50 Fr. The dilation site was not                            examined. No heme.                           The exam of the esophagus revealed mild distal                            esophagitis but was otherwise normal.                           The stomach was normal.                           The examined duodenum was normal.  The cardia and gastric fundus were normal on                            retroflexion, with small hiatal hernia noted. Complications:            No immediate complications. Estimated Blood Loss:     Estimated blood loss: none. Impression:               - Benign-appearing esophageal stenosis. Dilated.                            Reflux esophagitis.                           - Normal stomach.                           - Normal examined duodenum.                           - No specimens collected. Recommendation:           - Prescribe omeprazole 40 mg daily; #30; 11 refills.                           - Resume previous diet.                           - Continue present medications. Docia Chuck. Henrene Pastor, MD 04/26/2016 12:14:16 PM This report has been signed electronically. CC Letter to:             Dominica Severin B. Sherrill

## 2016-04-28 ENCOUNTER — Other Ambulatory Visit: Payer: Self-pay | Admitting: Oncology

## 2016-04-29 ENCOUNTER — Telehealth: Payer: Self-pay

## 2016-04-29 NOTE — Telephone Encounter (Signed)
  Follow up Call-  Call back number 04/26/2016  Post procedure Call Back phone  # [phone] Rip Harbour - pt's daughter  Permission to leave phone message Yes     Patient questions:  Do you have a fever, pain , or abdominal swelling? No. Pain Score  0 *  Have you tolerated food without any problems? Yes.    Have you been able to return to your normal activities? Yes.    Do you have any questions about your discharge instructions: Diet   No. Medications  No. Follow up visit  No.  Do you have questions or concerns about your Care? No.  Actions: * If pain score is 4 or above: No action needed, pain <4.

## 2016-04-30 ENCOUNTER — Other Ambulatory Visit (HOSPITAL_BASED_OUTPATIENT_CLINIC_OR_DEPARTMENT_OTHER): Payer: Medicare Other

## 2016-04-30 ENCOUNTER — Ambulatory Visit (HOSPITAL_BASED_OUTPATIENT_CLINIC_OR_DEPARTMENT_OTHER): Payer: Medicare Other

## 2016-04-30 VITALS — BP 160/81 | HR 89 | Resp 18

## 2016-04-30 DIAGNOSIS — C9002 Multiple myeloma in relapse: Secondary | ICD-10-CM

## 2016-04-30 DIAGNOSIS — Z5112 Encounter for antineoplastic immunotherapy: Secondary | ICD-10-CM | POA: Diagnosis not present

## 2016-04-30 LAB — CBC WITH DIFFERENTIAL/PLATELET
BASO%: 0.1 % (ref 0.0–2.0)
BASOS ABS: 0 10*3/uL (ref 0.0–0.1)
EOS%: 0.4 % (ref 0.0–7.0)
Eosinophils Absolute: 0 10*3/uL (ref 0.0–0.5)
HEMATOCRIT: 31.7 % — AB (ref 38.4–49.9)
HEMOGLOBIN: 11.2 g/dL — AB (ref 13.0–17.1)
LYMPH#: 0.3 10*3/uL — AB (ref 0.9–3.3)
LYMPH%: 5.5 % — ABNORMAL LOW (ref 14.0–49.0)
MCH: 35.2 pg — ABNORMAL HIGH (ref 27.2–33.4)
MCHC: 35.4 g/dL (ref 32.0–36.0)
MCV: 99.6 fL — ABNORMAL HIGH (ref 79.3–98.0)
MONO#: 0.4 10*3/uL (ref 0.1–0.9)
MONO%: 8.1 % (ref 0.0–14.0)
NEUT%: 85.9 % — ABNORMAL HIGH (ref 39.0–75.0)
NEUTROS ABS: 4 10*3/uL (ref 1.5–6.5)
Platelets: 86 10*3/uL — ABNORMAL LOW (ref 140–400)
RBC: 3.18 10*6/uL — ABNORMAL LOW (ref 4.20–5.82)
RDW: 15.3 % — ABNORMAL HIGH (ref 11.0–14.6)
WBC: 4.7 10*3/uL (ref 4.0–10.3)

## 2016-04-30 MED ORDER — ONDANSETRON HCL 8 MG PO TABS
8.0000 mg | ORAL_TABLET | Freq: Once | ORAL | Status: AC
  Administered 2016-04-30: 8 mg via ORAL

## 2016-04-30 MED ORDER — BORTEZOMIB CHEMO SQ INJECTION 3.5 MG (2.5MG/ML)
1.3000 mg/m2 | Freq: Once | INTRAMUSCULAR | Status: AC
  Administered 2016-04-30: 2.5 mg via SUBCUTANEOUS
  Filled 2016-04-30: qty 2.5

## 2016-04-30 MED ORDER — ONDANSETRON HCL 8 MG PO TABS
ORAL_TABLET | ORAL | Status: AC
  Filled 2016-04-30: qty 1

## 2016-04-30 NOTE — Progress Notes (Signed)
OK to treat with platelet count of 86 per Dr. Benay Spice.

## 2016-04-30 NOTE — Patient Instructions (Signed)
[facility] Discharge Instructions for Patients Receiving Chemotherapy  Today you received the following chemotherapy agents Velcade  To help prevent nausea and vomiting after your treatment, we encourage you to take your nausea medication    If you develop nausea and vomiting that is not controlled by your nausea medication, call the clinic.   BELOW ARE SYMPTOMS THAT SHOULD BE REPORTED IMMEDIATELY:  *FEVER GREATER THAN 100.5 F  *CHILLS WITH OR WITHOUT FEVER  NAUSEA AND VOMITING THAT IS NOT CONTROLLED WITH YOUR NAUSEA MEDICATION  *UNUSUAL SHORTNESS OF BREATH  *UNUSUAL BRUISING OR BLEEDING  TENDERNESS IN MOUTH AND THROAT WITH OR WITHOUT PRESENCE OF ULCERS  *URINARY PROBLEMS  *BOWEL PROBLEMS  UNUSUAL RASH Items with * indicate a potential emergency and should be followed up as soon as possible.  Feel free to call the clinic you have any questions or concerns. The clinic phone number is [phone].  Please show the CHEMO ALERT CARD at check-in to the Emergency Department and triage nurse.   

## 2016-05-06 ENCOUNTER — Other Ambulatory Visit: Payer: Self-pay | Admitting: Oncology

## 2016-05-07 ENCOUNTER — Ambulatory Visit: Payer: Medicare Other

## 2016-05-07 ENCOUNTER — Other Ambulatory Visit: Payer: Medicare Other

## 2016-05-08 ENCOUNTER — Other Ambulatory Visit (HOSPITAL_BASED_OUTPATIENT_CLINIC_OR_DEPARTMENT_OTHER): Payer: Medicare Other

## 2016-05-08 ENCOUNTER — Other Ambulatory Visit: Payer: Self-pay | Admitting: Nurse Practitioner

## 2016-05-08 ENCOUNTER — Ambulatory Visit (HOSPITAL_BASED_OUTPATIENT_CLINIC_OR_DEPARTMENT_OTHER): Payer: Medicare Other

## 2016-05-08 VITALS — BP 149/85 | HR 79 | Temp 98.3°F | Resp 18

## 2016-05-08 DIAGNOSIS — Z5112 Encounter for antineoplastic immunotherapy: Secondary | ICD-10-CM | POA: Diagnosis not present

## 2016-05-08 DIAGNOSIS — C9002 Multiple myeloma in relapse: Secondary | ICD-10-CM | POA: Diagnosis not present

## 2016-05-08 LAB — CBC WITH DIFFERENTIAL/PLATELET
BASO%: 0.2 % (ref 0.0–2.0)
Basophils Absolute: 0 10*3/uL (ref 0.0–0.1)
EOS%: 0.9 % (ref 0.0–7.0)
Eosinophils Absolute: 0 10*3/uL (ref 0.0–0.5)
HCT: 30.9 % — ABNORMAL LOW (ref 38.4–49.9)
HEMOGLOBIN: 10.9 g/dL — AB (ref 13.0–17.1)
LYMPH%: 8.8 % — AB (ref 14.0–49.0)
MCH: 34.6 pg — ABNORMAL HIGH (ref 27.2–33.4)
MCHC: 35.2 g/dL (ref 32.0–36.0)
MCV: 98.2 fL — AB (ref 79.3–98.0)
MONO#: 0.7 10*3/uL (ref 0.1–0.9)
MONO%: 14.7 % — AB (ref 0.0–14.0)
NEUT%: 75.4 % — ABNORMAL HIGH (ref 39.0–75.0)
NEUTROS ABS: 3.7 10*3/uL (ref 1.5–6.5)
Platelets: 88 10*3/uL — ABNORMAL LOW (ref 140–400)
RBC: 3.15 10*6/uL — AB (ref 4.20–5.82)
RDW: 15.1 % — AB (ref 11.0–14.6)
WBC: 4.9 10*3/uL (ref 4.0–10.3)
lymph#: 0.4 10*3/uL — ABNORMAL LOW (ref 0.9–3.3)

## 2016-05-08 MED ORDER — ONDANSETRON HCL 8 MG PO TABS
8.0000 mg | ORAL_TABLET | Freq: Once | ORAL | Status: AC
  Administered 2016-05-08: 8 mg via ORAL

## 2016-05-08 MED ORDER — ONDANSETRON HCL 8 MG PO TABS
ORAL_TABLET | ORAL | Status: AC
  Filled 2016-05-08: qty 1

## 2016-05-08 MED ORDER — BORTEZOMIB CHEMO SQ INJECTION 3.5 MG (2.5MG/ML)
1.3000 mg/m2 | Freq: Once | INTRAMUSCULAR | Status: AC
  Administered 2016-05-08: 2.5 mg via SUBCUTANEOUS
  Filled 2016-05-08: qty 2.5

## 2016-05-08 NOTE — Progress Notes (Signed)
Per Dr. Benay Spice: No CMET needed today. OK to treat with PLT 88k and 04/23/16 chem panel. Jarrett Soho, Infusion RN notified.

## 2016-05-08 NOTE — Patient Instructions (Signed)
[facility] Discharge Instructions for Patients Receiving Chemotherapy  Today you received the following chemotherapy agents Velcade  To help prevent nausea and vomiting after your treatment, we encourage you to take your nausea medication    If you develop nausea and vomiting that is not controlled by your nausea medication, call the clinic.   BELOW ARE SYMPTOMS THAT SHOULD BE REPORTED IMMEDIATELY:  *FEVER GREATER THAN 100.5 F  *CHILLS WITH OR WITHOUT FEVER  NAUSEA AND VOMITING THAT IS NOT CONTROLLED WITH YOUR NAUSEA MEDICATION  *UNUSUAL SHORTNESS OF BREATH  *UNUSUAL BRUISING OR BLEEDING  TENDERNESS IN MOUTH AND THROAT WITH OR WITHOUT PRESENCE OF ULCERS  *URINARY PROBLEMS  *BOWEL PROBLEMS  UNUSUAL RASH Items with * indicate a potential emergency and should be followed up as soon as possible.  Feel free to call the clinic you have any questions or concerns. The clinic phone number is [phone].  Please show the CHEMO ALERT CARD at check-in to the Emergency Department and triage nurse.   

## 2016-05-10 ENCOUNTER — Other Ambulatory Visit: Payer: Self-pay | Admitting: Oncology

## 2016-05-16 ENCOUNTER — Encounter (HOSPITAL_COMMUNITY): Payer: Self-pay

## 2016-05-16 ENCOUNTER — Emergency Department (HOSPITAL_COMMUNITY): Payer: Medicare Other

## 2016-05-16 ENCOUNTER — Emergency Department (HOSPITAL_COMMUNITY)
Admission: EM | Admit: 2016-05-16 | Discharge: 2016-05-16 | Disposition: A | Discharge status: Home / Self Care | Payer: Medicare Other | Attending: Emergency Medicine | Admitting: Emergency Medicine

## 2016-05-16 DIAGNOSIS — I10 Essential (primary) hypertension: Secondary | ICD-10-CM | POA: Diagnosis not present

## 2016-05-16 DIAGNOSIS — E785 Hyperlipidemia, unspecified: Secondary | ICD-10-CM | POA: Diagnosis not present

## 2016-05-16 DIAGNOSIS — Z8589 Personal history of malignant neoplasm of other organs and systems: Secondary | ICD-10-CM | POA: Diagnosis not present

## 2016-05-16 DIAGNOSIS — C61 Malignant neoplasm of prostate: Secondary | ICD-10-CM | POA: Insufficient documentation

## 2016-05-16 DIAGNOSIS — E871 Hypo-osmolality and hyponatremia: Secondary | ICD-10-CM | POA: Diagnosis not present

## 2016-05-16 DIAGNOSIS — R531 Weakness: Secondary | ICD-10-CM | POA: Diagnosis present

## 2016-05-16 DIAGNOSIS — Z79899 Other long term (current) drug therapy: Secondary | ICD-10-CM | POA: Insufficient documentation

## 2016-05-16 DIAGNOSIS — R4182 Altered mental status, unspecified: Secondary | ICD-10-CM | POA: Diagnosis not present

## 2016-05-16 DIAGNOSIS — Z7982 Long term (current) use of aspirin: Secondary | ICD-10-CM | POA: Diagnosis not present

## 2016-05-16 DIAGNOSIS — Z87891 Personal history of nicotine dependence: Secondary | ICD-10-CM | POA: Insufficient documentation

## 2016-05-16 LAB — URINALYSIS, ROUTINE W REFLEX MICROSCOPIC
BILIRUBIN URINE: NEGATIVE
GLUCOSE, UA: NEGATIVE mg/dL
HGB URINE DIPSTICK: NEGATIVE
KETONES UR: NEGATIVE mg/dL
Leukocytes, UA: NEGATIVE
NITRITE: NEGATIVE
PH: 7.5 (ref 5.0–8.0)
Protein, ur: NEGATIVE mg/dL
SPECIFIC GRAVITY, URINE: 1.016 (ref 1.005–1.030)

## 2016-05-16 LAB — TROPONIN I: Troponin I: <0.03 ng/mL (ref <0.031)

## 2016-05-16 LAB — CBC
HCT: 29.2 % — ABNORMAL LOW (ref 39.0–52.0)
Hemoglobin: 10.7 g/dL — ABNORMAL LOW (ref 13.0–17.0)
MCH: 34.1 pg — AB (ref 26.0–34.0)
MCHC: 36.6 g/dL — AB (ref 30.0–36.0)
MCV: 93 fL (ref 78.0–100.0)
PLATELETS: 99 10*3/uL — AB (ref 150–400)
RBC: 3.14 MIL/uL — ABNORMAL LOW (ref 4.22–5.81)
RDW: 14.7 % (ref 11.5–15.5)
WBC: 3.6 10*3/uL — AB (ref 4.0–10.5)

## 2016-05-16 LAB — COMPREHENSIVE METABOLIC PANEL
ALBUMIN: 3.8 g/dL (ref 3.5–5.0)
ALT: 27 U/L (ref 17–63)
AST: 26 U/L (ref 15–41)
Alkaline Phosphatase: 64 U/L (ref 38–126)
Anion gap: 7 (ref 5–15)
BUN: 15 mg/dL (ref 6–20)
CHLORIDE: 93 mmol/L — AB (ref 101–111)
CO2: 26 mmol/L (ref 22–32)
CREATININE: 0.84 mg/dL (ref 0.61–1.24)
Calcium: 9.6 mg/dL (ref 8.9–10.3)
GFR calc Af Amer: >60 mL/min (ref >60)
GLUCOSE: 107 mg/dL — AB (ref 65–99)
Potassium: 3.4 mmol/L — ABNORMAL LOW (ref 3.5–5.1)
Sodium: 126 mmol/L — ABNORMAL LOW (ref 135–145)
Total Bilirubin: 1.1 mg/dL (ref 0.3–1.2)
Total Protein: 6.1 g/dL — ABNORMAL LOW (ref 6.5–8.1)

## 2016-05-16 LAB — CBG MONITORING, ED: Glucose-Capillary: 129 mg/dL — ABNORMAL HIGH (ref 65–99)

## 2016-05-16 NOTE — ED Notes (Signed)
Per family at bedside, pt has been experiencing some confusion and has not been "himself". Pt reports that he is here because of weakness. Pt is able to give me his full name, date of birth, and reports that he is in the emergency room but is confused about the day. Per family, he has not bathed in 5 days which is atypical for the patient. Family also reports that he has had chills. Pt is a cancer patient, last treatment was 9 days ago.

## 2016-05-16 NOTE — ED Provider Notes (Signed)
CSN: [id number]     Arrival date & time 05/16/16  1805 History   First MD Initiated Contact with Patient 05/16/16 1918     Chief Complaint  Patient presents with  . Weakness  . Altered Mental Status     (Consider location/radiation/quality/duration/timing/severity/associated sxs/prior Treatment) HPI  82 year old male presents with a chief complaint of weakness and altered mental status. Daughter at the bedside provides the history. Patient has been intermittently confused and not acting like himself. He has been weak and sometimes 2 weeks ago out of bed. Daughter states he has not showered in 5 days which is abnormal for him. No known falls. Daughter has noticed that his right eye seems to be swollen, patient tells me that he has been rubbing it to try and get sleepy gunk out of it. No known fevers. Daughter states last time he is like this he had pneumonia. However this time he does not have any cough or fever. No complaints of chest pain. Patient is currently in the off week of his chemotherapy.  Past Medical History  Diagnosis Date  . Hypertension   . Dyslipidemia   . Arrhythmia     atrial fibrillation  . Cholelithiasis   . GERD (gastroesophageal reflux disease)   . Hiatal hernia   . Esophageal stricture   . History of BPH   . History of radiation therapy 01/29/11 -03/15/11    larynx 6600 cGy 33 sessions  . Arthritis   . Anemia     pt's daughter denies  . Multiple myeloma   . 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 Prostate cancer ([employer]) 01/23/11    Gleason 3+3=6  . Facial basal cell cancer     nose  . Cataract     bil removed  . Pneumonia    Past Surgical History  Procedure Laterality Date  . Hip surgery    . Back surgery    . Trigeminal neuralgia    . Appendectomy    . Tonsillectomy    . Right chest wall resection      of second ,third,and fourth rib  . Esophageal dilation    . Colonoscopy    . Upper gastrointestinal endoscopy     Family History   Problem Relation Age of Onset  . Heart attack Father     age 26  . Multiple myeloma Mother   . Colon cancer Neg Hx   . Esophageal cancer Neg Hx   . Pancreatic cancer Neg Hx   . Prostate cancer Neg Hx   . Rectal cancer Neg Hx   . Stomach cancer Neg Hx    Social History  Substance Use Topics  . Smoking status: Former Smoker    Types: Cigarettes    Quit date: 12/09/1993  . Smokeless tobacco: Former Systems developer  . Alcohol Use: No    Review of Systems  Constitutional: Negative for fever.  Respiratory: Negative for cough and shortness of breath.   Cardiovascular: Negative for chest pain.  Gastrointestinal: Negative for vomiting and abdominal pain.  Neurological: Positive for weakness. Negative for headaches.  Psychiatric/Behavioral: Positive for confusion.  All other systems reviewed and are negative.     Allergies  Celebrex and Coricidin d cold-flu-sinus   Home Medications   Prior to Admission medications   Medication Sig Start Date End Date Taking? Authorizing Provider  acyclovir (ZOVIRAX) 400 MG tablet TAKE ONE TABLET BY MOUTH TWICE DAILY 05/10/16   Ladell Pier, MD  aspirin 81 MG tablet Take 81 mg by mouth daily.      Historical Provider, MD  Cholecalciferol 5000 UNITS TABS Take 5,000 Units by mouth daily.    Historical Provider, MD  cyclophosphamide (CYTOXAN) 50 MG tablet Take 10 tablets (500 mg total) by mouth once a week. 3 weeks on, 1 week off. Give on an empty stomach 1 hour before or 2 hours after meals. 04/23/16   Ladell Pier, MD  dexamethasone (DECADRON) 4 MG tablet Take 5 tablets (20 mg total) by mouth once a week. 02/27/16   Owens Shark, NP  losartan-hydrochlorothiazide (HYZAAR) 50-12.5 MG per tablet Take 1 tablet by mouth daily.    Historical Provider, MD  Multiple Vitamin (MULTIVITAMIN) capsule Take 1 capsule by mouth daily.      Historical Provider, MD  ondansetron (ZOFRAN) 8 MG tablet Take 1 tablet (8 mg total) by mouth every 8 (eight) hours as needed for  nausea or vomiting. 11/21/15   Ladell Pier, MD  pantoprazole (PROTONIX) 40 MG tablet TAKE ONE TABLET BY MOUTH ONCE DAILY 03/13/16   Ladell Pier, MD  rOPINIRole (REQUIP) 0.25 MG tablet TAKE ONE TO TWO TABLETS BY MOUTH ONCE DAILY AT BEDTIME AS NEEDED FOR  RESTLESS  LEGS 05/10/16   Owens Shark, NP  saw palmetto 160 MG capsule Take 160 mg by mouth daily. Three pills once per day    Historical Provider, MD  Tamsulosin HCl (FLOMAX) 0.4 MG CAPS Take by mouth daily.     Historical Provider, MD  vitamin B-12 (CYANOCOBALAMIN) 100 MCG tablet Take 100 mcg by mouth daily.      Historical Provider, MD   BP 124/70 mmHg  Pulse 90  Temp(Src) 98.8 F (37.1 C) (Oral)  Resp 18  SpO2 94% Physical Exam  Constitutional: He is oriented to person, place, and time. He appears well-developed and well-nourished.  HENT:  Head: Normocephalic and atraumatic.  Right Ear: External ear normal.  Left Ear: External ear normal.  Nose: Nose normal.  Eyes: EOM are normal. Pupils are equal, round, and reactive to light. Right eye exhibits no discharge. Left eye exhibits no discharge.  Neck: Normal range of motion. Neck supple.  No meningismus  Cardiovascular: Normal rate, regular rhythm, normal heart sounds and intact distal pulses.   Pulmonary/Chest: Effort normal and breath sounds normal.  Abdominal: Soft. There is no tenderness.  Musculoskeletal: He exhibits no edema.  Neurological: He is alert and oriented to person, place, and time.  Appears alert and oriented. CN 3-12 grossly intact. 5/5 strength in all 4 extremities. Normal gross sensation  Skin: Skin is warm and dry.  Nursing note and vitals reviewed.   ED Course  Procedures (including critical care time) Labs Review Labs Reviewed  COMPREHENSIVE METABOLIC PANEL - Abnormal; Notable for the following:    Sodium 126 (*)    Potassium 3.4 (*)    Chloride 93 (*)    Glucose, Bld 107 (*)    Total Protein 6.1 (*)    All other components within normal limits   CBC - Abnormal; Notable for the following:    WBC 3.6 (*)    RBC 3.14 (*)    Hemoglobin 10.7 (*)    HCT 29.2 (*)    MCH 34.1 (*)    MCHC 36.6 (*)    Platelets 99 (*)    All other components within normal limits  CBG MONITORING, ED - Abnormal; Notable for the following:    Glucose-Capillary 129 (*)  All other components within normal limits  URINALYSIS, ROUTINE W REFLEX MICROSCOPIC (NOT AT [facility])  TROPONIN I    Imaging Review Dg Chest 2 View  05/16/2016  CLINICAL DATA:  Acute mental status change. EXAM: CHEST  2 VIEW COMPARISON:  August 23, 2014 FINDINGS: The patient is status post partial right chest wall resection, unchanged. No pneumothorax. Haziness over the bases is consistent with an overlapping skin fold. There may be mild bibasilar atelectasis. No suspicious infiltrates are identified. A nodular density projects over the inferior right scapula, not seen previously. This nodule measures 6 mm. No other nodules or masses. No change in the heart, hila, or mediastinum. Anterior wedging of lower thoracic vertebral bodies is stable since the CT scan from December 2015. IMPRESSION: 1. 6 mm nodule projected over the right mid lung. Whether this is within the scapula, overlapping rib, or the lung is unclear on this single study. It was not seen previously however. A CT scan could further evaluate. 2. Mild opacity in the lung bases is favored to represent atelectasis. Electronically Signed   By: Dorise Bullion III M.D   On: 05/16/2016 20:11   Ct Head Wo Contrast  05/16/2016  CLINICAL DATA:  Altered mental status, weakness EXAM: CT HEAD WITHOUT CONTRAST TECHNIQUE: Contiguous axial images were obtained from the base of the skull through the vertex without intravenous contrast. COMPARISON:  05/01/2012 FINDINGS: Brain: No evidence of acute infarction, hemorrhage, extra-axial collection, ventriculomegaly, or mass effect. Generalized cerebral atrophy. Periventricular white matter low attenuation  likely secondary to microangiopathy. Vascular: Cerebrovascular atherosclerotic calcifications are noted. Skull: Negative for fracture or focal lesion. Old craniotomy defect in the right occipital region. Sinuses/Orbits: Visualized portions of the orbits are unremarkable. Visualized portions of the paranasal sinuses and mastoid air cells are unremarkable. Other: None. IMPRESSION: 1. No acute intracranial pathology. 2. Chronic microvascular disease and cerebral atrophy. Electronically Signed   By: Kathreen Devoid   On: 05/16/2016 20:49   I have personally reviewed and evaluated these images and lab results as part of my medical decision-making.   EKG Interpretation   Date/Time:  Thursday May 16 2016 19:47:26 EDT Ventricular Rate:  84 PR Interval:  208 QRS Duration: 142 QT Interval:  429 QTC Calculation: 507 R Axis:   -74 Text Interpretation:  Sinus rhythm RBBB and LAFB a flutter not currently  present when compared with 2011 Confirmed by Jigar Zielke MD, Zaiya Annunziato [phone]) on  05/17/2016 1:56:39 AM      MDM   Final diagnoses:  Hyponatremia    Patient is currently at mental baseline during exam according to daughter at bedside. No fevers or current AMS. No signs of infection. Neuro exam benign and appears to have normal strength. Patient states he feels fine. Only significant abnormality is sodium of 126. Was 129 1 month ago. Could be contributing but this is not a significant change compared over 1 month. Discussed keeping in hospital overnight with some fluids and monitoring. Family and patient want to go home and will f/u with PCP tomorrow. Will d/c hyzaar at this time. Discussed strict return precautions.     Sherwood Gambler, MD 05/17/16 [phone]

## 2016-05-16 NOTE — ED Notes (Signed)
Patient transported to X-ray 

## 2016-05-17 ENCOUNTER — Telehealth: Payer: Self-pay | Admitting: *Deleted

## 2016-05-17 NOTE — Telephone Encounter (Signed)
Received fax from call service: Pt is weak and dizzy. Hadn't showered in 5 days. Noted pt was seen in ED. Called Melinda to follow up after ED visit 6/8. Sodium was slightly lower than baseline. She reports pt did not want to be admitted to hospital. He was able to get up and shower this morning. She thinks his treatment plan may need to be changed. He is not bouncing back on his week off from chemo like he used to.  Will make Dr. Benay Spice aware.

## 2016-05-19 ENCOUNTER — Other Ambulatory Visit: Payer: Self-pay | Admitting: Oncology

## 2016-05-20 ENCOUNTER — Other Ambulatory Visit: Payer: Self-pay | Admitting: *Deleted

## 2016-05-20 DIAGNOSIS — C9002 Multiple myeloma in relapse: Secondary | ICD-10-CM

## 2016-05-20 MED ORDER — CYCLOPHOSPHAMIDE 50 MG PO TABS: 500.0000 mg | ORAL_TABLET | ORAL | Status: DC

## 2016-05-20 NOTE — Telephone Encounter (Signed)
Called and spoke with patient's daughter regarding start of Cytoxan for this cycle.  Per pt.'s daughter Cytoxan to start on 05/21/16. Pt.'s daughter voiced concern regarding Acyclovir.  She states that she feels as though the Acyclovir is making her father "feel bad" and he has not taken it since Saturday.  L. Thomas NP notified and scheduled to see patient on 05/21/16.

## 2016-05-21 ENCOUNTER — Ambulatory Visit (HOSPITAL_BASED_OUTPATIENT_CLINIC_OR_DEPARTMENT_OTHER): Payer: Medicare Other

## 2016-05-21 ENCOUNTER — Telehealth: Payer: Self-pay | Admitting: Nurse Practitioner

## 2016-05-21 ENCOUNTER — Ambulatory Visit (HOSPITAL_BASED_OUTPATIENT_CLINIC_OR_DEPARTMENT_OTHER): Payer: Medicare Other | Admitting: Nurse Practitioner

## 2016-05-21 ENCOUNTER — Other Ambulatory Visit (HOSPITAL_BASED_OUTPATIENT_CLINIC_OR_DEPARTMENT_OTHER): Payer: Medicare Other

## 2016-05-21 VITALS — BP 119/62 | HR 94 | Temp 97.8°F | Resp 17 | Ht 68.0 in | Wt 168.8 lb

## 2016-05-21 DIAGNOSIS — I10 Essential (primary) hypertension: Secondary | ICD-10-CM

## 2016-05-21 DIAGNOSIS — C9002 Multiple myeloma in relapse: Secondary | ICD-10-CM

## 2016-05-21 DIAGNOSIS — C9000 Multiple myeloma not having achieved remission: Secondary | ICD-10-CM | POA: Diagnosis not present

## 2016-05-21 DIAGNOSIS — Z5112 Encounter for antineoplastic immunotherapy: Secondary | ICD-10-CM

## 2016-05-21 DIAGNOSIS — R911 Solitary pulmonary nodule: Secondary | ICD-10-CM

## 2016-05-21 DIAGNOSIS — C320 Malignant neoplasm of glottis: Secondary | ICD-10-CM

## 2016-05-21 DIAGNOSIS — E871 Hypo-osmolality and hyponatremia: Secondary | ICD-10-CM

## 2016-05-21 DIAGNOSIS — R5381 Other malaise: Secondary | ICD-10-CM

## 2016-05-21 DIAGNOSIS — R634 Abnormal weight loss: Secondary | ICD-10-CM

## 2016-05-21 LAB — COMPREHENSIVE METABOLIC PANEL
ALT: 28 U/L (ref 0–55)
AST: 25 U/L (ref 5–34)
Albumin: 3.9 g/dL (ref 3.5–5.0)
Alkaline Phosphatase: 69 U/L (ref 40–150)
Anion Gap: 9 mEq/L (ref 3–11)
BUN: 14 mg/dL (ref 7.0–26.0)
CO2: 25 meq/L (ref 22–29)
Calcium: 10.1 mg/dL (ref 8.4–10.4)
Chloride: 95 mEq/L — ABNORMAL LOW (ref 98–109)
Creatinine: 1.1 mg/dL (ref 0.7–1.3)
EGFR: 63 mL/min/{1.73_m2} — AB (ref >90)
GLUCOSE: 101 mg/dL (ref 70–140)
POTASSIUM: 3.6 meq/L (ref 3.5–5.1)
SODIUM: 128 meq/L — AB (ref 136–145)
Total Bilirubin: 1.65 mg/dL — ABNORMAL HIGH (ref 0.20–1.20)
Total Protein: 6.4 g/dL (ref 6.4–8.3)

## 2016-05-21 LAB — CBC WITH DIFFERENTIAL/PLATELET
BASO%: 0.1 % (ref 0.0–2.0)
BASOS ABS: 0 10*3/uL (ref 0.0–0.1)
EOS ABS: 0 10*3/uL (ref 0.0–0.5)
EOS%: 0.4 % (ref 0.0–7.0)
HCT: 33.8 % — ABNORMAL LOW (ref 38.4–49.9)
HGB: 12 g/dL — ABNORMAL LOW (ref 13.0–17.1)
LYMPH%: 10.1 % — AB (ref 14.0–49.0)
MCH: 34.6 pg — AB (ref 27.2–33.4)
MCHC: 35.4 g/dL (ref 32.0–36.0)
MCV: 97.7 fL (ref 79.3–98.0)
MONO#: 0.7 10*3/uL (ref 0.1–0.9)
MONO%: 12.5 % (ref 0.0–14.0)
NEUT%: 76.9 % — ABNORMAL HIGH (ref 39.0–75.0)
NEUTROS ABS: 4.2 10*3/uL (ref 1.5–6.5)
Platelets: 117 10*3/uL — ABNORMAL LOW (ref 140–400)
RBC: 3.46 10*6/uL — AB (ref 4.20–5.82)
RDW: 15.5 % — ABNORMAL HIGH (ref 11.0–14.6)
WBC: 5.5 10*3/uL (ref 4.0–10.3)
lymph#: 0.6 10*3/uL — ABNORMAL LOW (ref 0.9–3.3)

## 2016-05-21 MED ORDER — ZOLEDRONIC ACID 4 MG/100ML IV SOLN
4.0000 mg | Freq: Once | INTRAVENOUS | Status: AC
  Administered 2016-05-21: 4 mg via INTRAVENOUS
  Filled 2016-05-21: qty 100

## 2016-05-21 MED ORDER — ONDANSETRON HCL 8 MG PO TABS
8.0000 mg | ORAL_TABLET | Freq: Once | ORAL | Status: AC
  Administered 2016-05-21: 8 mg via ORAL

## 2016-05-21 MED ORDER — ONDANSETRON HCL 8 MG PO TABS
ORAL_TABLET | ORAL | Status: AC
  Filled 2016-05-21: qty 1

## 2016-05-21 MED ORDER — BORTEZOMIB CHEMO SQ INJECTION 3.5 MG (2.5MG/ML)
1.3000 mg/m2 | Freq: Once | INTRAMUSCULAR | Status: AC
Start: 2016-05-21 — End: 2016-05-21
  Administered 2016-05-21: 2.5 mg via SUBCUTANEOUS
  Filled 2016-05-21: qty 2.5

## 2016-05-21 NOTE — Patient Instructions (Signed)
[facility] Discharge Instructions for Patients Receiving Chemotherapy  Today you received the following chemotherapy agents VELCADE and Zometa. To help prevent nausea and vomiting after your treatment, we encourage you to take your nausea medication as directed.  If you develop nausea and vomiting that is not controlled by your nausea medication, call the clinic.   BELOW ARE SYMPTOMS THAT SHOULD BE REPORTED IMMEDIATELY:  *FEVER GREATER THAN 100.5 F  *CHILLS WITH OR WITHOUT FEVER  NAUSEA AND VOMITING THAT IS NOT CONTROLLED WITH YOUR NAUSEA MEDICATION  *UNUSUAL SHORTNESS OF BREATH  *UNUSUAL BRUISING OR BLEEDING  TENDERNESS IN MOUTH AND THROAT WITH OR WITHOUT PRESENCE OF ULCERS  *URINARY PROBLEMS  *BOWEL PROBLEMS  UNUSUAL RASH Items with * indicate a potential emergency and should be followed up as soon as possible.  Feel free to call the clinic you have any questions or concerns. The clinic phone number is [phone].  Please show the [facility] at check-in to the Emergency Department and triage nurse.

## 2016-05-21 NOTE — Progress Notes (Addendum)
[facility] OFFICE PROGRESS NOTE   Diagnosis:  Multiple myeloma  INTERVAL HISTORY:   Richard [name] returns as scheduled. He continues Cytoxan/Velcade/Decadron. He has been more fatigued. He was seen in the emergency Department on 05/16/2016. Brain CT was negative for acute pathology. Labs showed hyponatremia. Chest x-ray showed a 6 mm nodule in the right midlung. His daughter reports he is some better. She is concerned the fatigue is related to acyclovir. He is having difficulty sleeping. He has tried Benadryl. He is swallowing better.  Objective:  Vital signs in last 24 hours:  Blood pressure 119/62, pulse 94, temperature 97.8 F (36.6 C), temperature source Oral, resp. rate 17, height 5' 8"  (1.727 m), weight 168 lb 12.8 oz (76.567 kg), SpO2 99 %.    HEENT: No thrush or ulcers. Resp: Lungs clear bilaterally. Cardio: Regular rate and rhythm. GI: No organomegaly. Vascular: No leg edema. Neuro: Alert and oriented. Follows commands.    Lab Results:  Lab Results  Component Value Date   WBC 5.5 05/21/2016   HGB 12.0* 05/21/2016   HCT 33.8* 05/21/2016   MCV 97.7 05/21/2016   PLT 117* 05/21/2016   NEUTROABS 4.2 05/21/2016  Sodium 128  Imaging:  No results found.  Medications: I have reviewed the patient's current medications.  Assessment/Plan: 1. Multiple myeloma: The serum free light chains were elevated 08/29/2009. A CT of the chest 08/31/2009 showed a new pleural mass consistent with progression of multiple myeloma. He completed 12 cycles of Revlimid/Decadron. The serum free light chains were improved to 4.34 on 12/29/2009. A restaging CT of the chest 02/19/2010 showed resolution of right axillary lymphadenopathy and a pleural-based mass. The serum free lambda light chains were again elevated.  [place]age Revlimid/Decadron initiated 12/08/2014  Cycle 2 01/05/2015  Serum light chains improved 01/24/2015  Cycle 3 02/01/2015  Cycle 4 03/02/2015  Cycle 5  03/30/2015  Cycle 6 04/27/2015  Cycle 7 05/25/2015  Cycle 8 06/22/2015  Cycle 9 07/20/2015  Cycle 10 08/18/2015  Changed to maintenance Revlimid beginning 09/23/2015  Progressive anemia and Elevated serum free light chains 11/13/2015  Changed to weekly Cytoxan/Velcade/Decadron beginning 11/21/2015, changed to 3/4 week schedule beginning 01/02/2016  Serum light chains improved on 03/26/2016  Serum light chains increased 04/23/2016 2.History of back pain, likely related to the pleural-based mass at the right chest, resolved. 2. Right axillary/subpectoral lymphadenopathy on a CT of the chest 08/31/2009, likely related to multiple myeloma, resolved. 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 He is maintained on every three-month Zometa. 4. Pancytopenia secondary to Revlimid and multiple myeloma. He has persistent mild thrombocytopenia. 5. Hypertension. 6. Status post hip replacement. 7. History of back surgery. 8. History of trigeminal neuralgia. 9. Status post removal of a lipoma from the right axilla in February 2010. 10. 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 11. Esophageal reflux disease, followed by Dr. Janace Hoard.  12. Esophageal stricture, status post an evaluation by Dr. Henrene Pastor. 13. Right vocal cord lesion, status post a biopsy by Dr. 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 He completed radiation under the direction of Dr. Valere Dross on 03/15/2011. 14. Admission with pneumonia 03/26/2010. 15. History of Atrial flutter/fibrillation while hospitalized August 2011. He is followed by Dr. Percival Spanish. 16. Elevated prostate specific antigen, status post a biopsy 01/23/2011. He was found to have a Gleason 6 cancer in 10% of the cores. He is followed with an observation approach. He is now followed by Dr. Diona Fanti. 17. History of Mild hypercalcemia  70. Fall with a left wrist fracture June 2015  19. Right chest wall  pain secondary to a right second rib plasmacytoma confirmed on a CT 11/28/2014, resolved 20. History of Hyponatremia 21. Thrombocytopenia secondary chemotherapy    Disposition: Richard [name] appears unchanged as compared to prior visits. The serum light chains were more elevated last month. We will follow-up on the results from today. For now the plan is to continue Cytoxan/Velcade/dexamethasone.  The etiology of the hyponatremia is unclear. The sodium level is better today. We will continue to monitor.  He is experiencing fatigue/malaise and has had some weight loss. We will see him back at a sooner interval to reevaluate. His daughter is concerned these symptoms are due to acyclovir. We placed acyclovir on hold.  Richard [name] will return for a follow-up visit in 2 weeks. He will contact the office in the interim with any problems. We will follow-up on the outstanding labs from today.  Patient seen with Dr. Benay Spice. 25 minutes were spent face-to-face at today's visit with the majority of that time involved in counseling/coordination of care.    Richard [name] ANP/GNP-BC   05/21/2016  9:52 AM  This was a shared visit with Richard [name]. The etiology of Richard [name] malaise and weight loss is unclear. We will continue monitoring his clinical status and laboratory studies closely.  Julieanne Manson, M.D.

## 2016-05-21 NOTE — Telephone Encounter (Signed)
per pof to sch pt appt-sent MW email to sch trmt-pt aware °

## 2016-05-21 NOTE — Progress Notes (Signed)
OK to treat with today's labs per Leander Rams NP.

## 2016-05-21 NOTE — Telephone Encounter (Signed)
per pof to sch pt appt-gave pt copy of avs-sent email to MW to sch trmt-pt aware

## 2016-05-22 ENCOUNTER — Ambulatory Visit: Payer: Medicare Other | Admitting: Internal Medicine

## 2016-05-22 LAB — KAPPA/LAMBDA LIGHT CHAINS
IG KAPPA FREE LIGHT CHAIN: 8.8 mg/L (ref 3.3–19.4)
IG LAMBDA FREE LIGHT CHAIN: 351 mg/L — AB (ref 5.7–26.3)
Kappa/Lambda FluidC Ratio: 0.03 — ABNORMAL LOW (ref 0.26–1.65)

## 2016-05-23 ENCOUNTER — Telehealth: Payer: Self-pay | Admitting: *Deleted

## 2016-05-23 NOTE — Telephone Encounter (Signed)
Per staff message and POF I have scheduled appts. Advised scheduler of appts. JMW  

## 2016-05-24 ENCOUNTER — Telehealth: Payer: Self-pay | Admitting: *Deleted

## 2016-05-24 ENCOUNTER — Telehealth: Payer: Self-pay | Admitting: Oncology

## 2016-05-24 ENCOUNTER — Telehealth: Payer: Self-pay | Admitting: Nurse Practitioner

## 2016-05-24 ENCOUNTER — Other Ambulatory Visit: Payer: Self-pay | Admitting: *Deleted

## 2016-05-24 DIAGNOSIS — C9000 Multiple myeloma not having achieved remission: Secondary | ICD-10-CM

## 2016-05-24 MED ORDER — DEXAMETHASONE 4 MG PO TABS: 40.0000 mg | ORAL_TABLET | ORAL | Status: DC

## 2016-05-24 MED ORDER — POMALIDOMIDE 3 MG PO CAPS: 3.0000 mg | ORAL_CAPSULE | Freq: Every day | ORAL | Status: DC

## 2016-05-24 NOTE — Telephone Encounter (Signed)
[name] daughter returned call. Informed Rip Harbour of lab results, need to change therapy. She voiced understanding. Requested we contact [facility] to cancel delivery of Cytoxan. Same done.  Melinda requested Pomalyst enrollment forms to be faxed to her.

## 2016-05-24 NOTE — Telephone Encounter (Signed)
cld pt and adv of time & date of appt adv appt move to 6/22@2 :15-adv no room in trmt room on 6/20

## 2016-05-24 NOTE — Telephone Encounter (Signed)
Pomalyst enrollment complete. Rx taken to oral chemo navigator. Decadron Rx e-prescribed to pharmacy. Called daughter with instructions: Pt to take 10 tabs weekly with no breaks.

## 2016-05-24 NOTE — Telephone Encounter (Signed)
-----   Message from Ladell Pier, MD sent at 05/23/2016  8:52 PM EDT ----- Please call daughter, lt. Chains are higher, MM progression likely explains his malaise Need to change therapy Plan to start pomalidomide/dex asap ----- Message -----    From: [facility] Interface    Sent: 05/21/2016   9:47 AM      To: Ladell Pier, MD

## 2016-05-24 NOTE — Telephone Encounter (Signed)
Left message from patient's daughter to please call the office regarding recent lab work showing progression of the myeloma and need to change therapy.

## 2016-05-27 ENCOUNTER — Ambulatory Visit (HOSPITAL_BASED_OUTPATIENT_CLINIC_OR_DEPARTMENT_OTHER): Payer: Medicare Other

## 2016-05-27 ENCOUNTER — Other Ambulatory Visit: Payer: Self-pay | Admitting: *Deleted

## 2016-05-27 ENCOUNTER — Telehealth: Payer: Self-pay | Admitting: *Deleted

## 2016-05-27 ENCOUNTER — Ambulatory Visit (HOSPITAL_BASED_OUTPATIENT_CLINIC_OR_DEPARTMENT_OTHER): Payer: Medicare Other | Admitting: Nurse Practitioner

## 2016-05-27 ENCOUNTER — Telehealth: Payer: Self-pay | Admitting: Nurse Practitioner

## 2016-05-27 ENCOUNTER — Telehealth: Payer: Self-pay | Admitting: Pharmacist

## 2016-05-27 VITALS — BP 125/63 | HR 93 | Temp 98.8°F | Resp 18

## 2016-05-27 DIAGNOSIS — E876 Hypokalemia: Secondary | ICD-10-CM

## 2016-05-27 DIAGNOSIS — D61818 Other pancytopenia: Secondary | ICD-10-CM | POA: Diagnosis not present

## 2016-05-27 DIAGNOSIS — E860 Dehydration: Secondary | ICD-10-CM | POA: Diagnosis not present

## 2016-05-27 DIAGNOSIS — C9000 Multiple myeloma not having achieved remission: Secondary | ICD-10-CM

## 2016-05-27 DIAGNOSIS — C61 Malignant neoplasm of prostate: Secondary | ICD-10-CM

## 2016-05-27 DIAGNOSIS — H00012 Hordeolum externum right lower eyelid: Secondary | ICD-10-CM

## 2016-05-27 DIAGNOSIS — H00013 Hordeolum externum right eye, unspecified eyelid: Secondary | ICD-10-CM

## 2016-05-27 LAB — HEPATIC FUNCTION PANEL
ALT: 23 U/L (ref 10–40)
AST: 23 U/L (ref 14–40)
Alkaline Phosphatase: 67 U/L (ref 25–125)
BILIRUBIN, TOTAL: 1.2 mg/dL

## 2016-05-27 LAB — BASIC METABOLIC PANEL
BUN: 10 mg/dL (ref 4–21)
Creatinine: 0.9 mg/dL (ref 0.6–1.3)
GLUCOSE: 125 mg/dL
Potassium: 3.1 mmol/L — AB (ref 3.4–5.3)
SODIUM: 124 mmol/L — AB (ref 137–147)

## 2016-05-27 LAB — CBC WITH DIFFERENTIAL/PLATELET
BASO%: 0 % (ref 0.0–2.0)
BASOS ABS: 0 10*3/uL (ref 0.0–0.1)
EOS%: 0.9 % (ref 0.0–7.0)
Eosinophils Absolute: 0 10*3/uL (ref 0.0–0.5)
HEMATOCRIT: 27 % — AB (ref 38.4–49.9)
HGB: 9.7 g/dL — ABNORMAL LOW (ref 13.0–17.1)
LYMPH#: 0.7 10*3/uL — AB (ref 0.9–3.3)
LYMPH%: 16.2 % (ref 14.0–49.0)
MCH: 34.2 pg — ABNORMAL HIGH (ref 27.2–33.4)
MCHC: 35.9 g/dL (ref 32.0–36.0)
MCV: 95.1 fL (ref 79.3–98.0)
MONO#: 0.6 10*3/uL (ref 0.1–0.9)
MONO%: 14.3 % — AB (ref 0.0–14.0)
NEUT%: 68.6 % (ref 39.0–75.0)
NEUTROS ABS: 3 10*3/uL (ref 1.5–6.5)
PLATELETS: 62 10*3/uL — AB (ref 140–400)
RBC: 2.84 10*6/uL — AB (ref 4.20–5.82)
RDW: 14.4 % (ref 11.0–14.6)
WBC: 4.3 10*3/uL (ref 4.0–10.3)

## 2016-05-27 LAB — COMPREHENSIVE METABOLIC PANEL
ALBUMIN: 3.5 g/dL (ref 3.5–5.0)
ALK PHOS: 67 U/L (ref 40–150)
ALT: 23 U/L (ref 0–55)
ANION GAP: 8 meq/L (ref 3–11)
AST: 23 U/L (ref 5–34)
BILIRUBIN TOTAL: 1.21 mg/dL — AB (ref 0.20–1.20)
BUN: 9.8 mg/dL (ref 7.0–26.0)
CALCIUM: 9 mg/dL (ref 8.4–10.4)
CO2: 24 mEq/L (ref 22–29)
Chloride: 92 mEq/L — ABNORMAL LOW (ref 98–109)
Creatinine: 0.9 mg/dL (ref 0.7–1.3)
EGFR: 81 mL/min/{1.73_m2} — AB (ref >90)
Glucose: 125 mg/dl (ref 70–140)
POTASSIUM: 3.1 meq/L — AB (ref 3.5–5.1)
SODIUM: 124 meq/L — AB (ref 136–145)
TOTAL PROTEIN: 5.9 g/dL — AB (ref 6.4–8.3)

## 2016-05-27 LAB — CBC AND DIFFERENTIAL
HEMATOCRIT: 27 % — AB (ref 41–53)
HEMOGLOBIN: 9.7 g/dL — AB (ref 13.5–17.5)
PLATELETS: 62 10*3/uL — AB (ref 150–399)
WBC: 4.3 10*3/mL

## 2016-05-27 LAB — MAGNESIUM: MAGNESIUM: 1.9 mg/dL (ref 1.5–2.5)

## 2016-05-27 MED ORDER — DEXAMETHASONE 4 MG PO TABS
40.0000 mg | ORAL_TABLET | Freq: Once | ORAL | Status: AC
  Administered 2016-05-27: 40 mg via ORAL

## 2016-05-27 MED ORDER — POTASSIUM CHLORIDE CRYS ER 20 MEQ PO TBCR
40.0000 meq | EXTENDED_RELEASE_TABLET | Freq: Once | ORAL | Status: AC
  Administered 2016-05-27: 40 meq via ORAL
  Filled 2016-05-27: qty 2

## 2016-05-27 MED ORDER — SODIUM CHLORIDE 0.9 % IV SOLN
INTRAVENOUS | Status: AC
  Administered 2016-05-27: 14:00:00 via INTRAVENOUS

## 2016-05-27 MED ORDER — POMALIDOMIDE 3 MG PO CAPS: 3.0000 mg | ORAL_CAPSULE | Freq: Every day | ORAL | Status: DC

## 2016-05-27 MED ORDER — DEXAMETHASONE 4 MG PO TABS
ORAL_TABLET | ORAL | Status: AC
  Filled 2016-05-27: qty 7

## 2016-05-27 MED ORDER — DEXAMETHASONE 4 MG PO TABS
ORAL_TABLET | ORAL | Status: AC
  Filled 2016-05-27: qty 3

## 2016-05-27 MED ORDER — BACITRACIN-POLYMYXIN B 500-10000 UNIT/GM OP OINT: 1.0000 "application " | TOPICAL_OINTMENT | Freq: Four times a day (QID) | OPHTHALMIC | Status: DC

## 2016-05-27 NOTE — Telephone Encounter (Signed)
Message from pt's daughter reporting diarrhea over the weekend and confusion. Requesting IV fluids, she thinks sodium level may be lower. Reviewed with Dr. Benay Spice: Order received for [facility]. Order to schedulers.

## 2016-05-27 NOTE — Telephone Encounter (Signed)
Pomalyst Rx escribed by RN. RN called pharmacy to process rx ASAP.

## 2016-05-27 NOTE — Telephone Encounter (Signed)
Apt added per pof, old pof was amended.. pof never came to inbox

## 2016-05-27 NOTE — Telephone Encounter (Signed)
Spoke with Theadora Rama, PharmD: Pomalyst Rx was not faxed on 6/16. There was a notation that Rx was e-prescribed. This RN e-prescribed Rx today, called [facility] with request they expedite Rx if possible.

## 2016-05-27 NOTE — Patient Instructions (Signed)

## 2016-05-28 ENCOUNTER — Encounter: Payer: Self-pay | Admitting: Oncology

## 2016-05-28 ENCOUNTER — Encounter: Payer: Self-pay | Admitting: Nurse Practitioner

## 2016-05-28 ENCOUNTER — Other Ambulatory Visit: Payer: Medicare Other

## 2016-05-28 ENCOUNTER — Telehealth: Payer: Self-pay | Admitting: *Deleted

## 2016-05-28 ENCOUNTER — Telehealth: Payer: Self-pay | Admitting: Oncology

## 2016-05-28 DIAGNOSIS — D61818 Other pancytopenia: Secondary | ICD-10-CM | POA: Insufficient documentation

## 2016-05-28 DIAGNOSIS — E860 Dehydration: Secondary | ICD-10-CM | POA: Insufficient documentation

## 2016-05-28 DIAGNOSIS — E876 Hypokalemia: Secondary | ICD-10-CM | POA: Insufficient documentation

## 2016-05-28 DIAGNOSIS — H00019 Hordeolum externum unspecified eye, unspecified eyelid: Secondary | ICD-10-CM | POA: Insufficient documentation

## 2016-05-28 LAB — SODIUM, URINE, RANDOM: Sodium, Urine: 55 mmol/L

## 2016-05-28 NOTE — Progress Notes (Signed)
SYMPTOM MANAGEMENT CLINIC    Chief Complaint: Confusion and dehydration  HPI:  Richard [name] 82 y.o. male diagnosed with multiple myeloma.  Patient is status post Cytoxan/Velcade/dexamethasone therapy.  He has shown progression of his disease; and is awaiting Pomalyst oral therapy to initiate for cycle.  Patient is status post Cytoxan/Velcade/dexamethasone therapy; in which she progressed with his multiple myeloma.  He is currently awaiting the arrival of the Pomalyst oral therapy to begin his first cycle.  Patient presented to the [facility] today with severe dehydration and increased confusion.  His daughter states the patient was actually almost delusional over the weekend.  Sodium was found to be 124 today.  Patient received IV fluid rehydration while at the [facility] today; and his thoughts did appear clear.  He continues to have no complaints whatsoever.  Patient last received Zometa on 05/21/2016; and will be due for his next cycle of Zometa infusion around 06/20/2016.  Patient will need to cancel all future Velcade injections.  No history exists.    Review of Systems  Constitutional: Positive for weight loss.  Eyes: Positive for discharge and redness.  Neurological:       Patient has been appearing confused progressively for the past several days.  All other systems reviewed and are negative.   Past Medical History  Diagnosis Date  . Hypertension   . Dyslipidemia   . Arrhythmia     atrial fibrillation  . Cholelithiasis   . GERD (gastroesophageal reflux disease)   . Hiatal hernia   . Esophageal stricture   . History of BPH   . History of radiation therapy 01/29/11 -03/15/11    larynx 6600 cGy 33 sessions  . Arthritis   . Anemia     pt's daughter denies  . Multiple myeloma   . 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 Prostate cancer ([place]) 01/23/11    Gleason 3+3=6  . Facial basal cell cancer     nose  . Cataract     bil removed  . Pneumonia      Past Surgical History  Procedure Laterality Date  . Hip surgery    . Back surgery    . Trigeminal neuralgia    . Appendectomy    . Tonsillectomy    . Right chest wall resection      of second ,third,and fourth rib  . Esophageal dilation    . Colonoscopy    . Upper gastrointestinal endoscopy      has Essential hypertension, benign; HYPERTENSION, PULMONARY; ATRIAL FIBRILLATION; ESOPHAGEAL STRICTURE; GERD; CHOLELITHIASIS; CHOLELITHIASIS; MURMUR; DYSPHAGIA UNSPECIFIED; ABNORMAL FINDINGS GI TRACT; Multiple myeloma ([place]); Vocal cord cancer ([place]); Prostate cancer ([place]); Multiple myeloma ([place]); Cancer of true vocal cord ([place]); Hordeolum externum (stye); Hypokalemia; Dehydration; Pancytopenia ([place]); and Hyperbilirubinemia on his problem list.    is allergic to celebrex and coricidin d cold-flu-sinus .    Medication List       This list is accurate as of: 05/27/16 11:59 PM.  Always use your most recent med list.               aspirin 81 MG tablet  Take 81 mg by mouth daily.     bacitracin-polymyxin b ophthalmic ointment  Commonly known as:  POLYSPORIN  Place 1 application into both eyes 4 (four) times daily.     Cholecalciferol 5000 units Tabs  Take 5,000 Units by mouth daily.     FLOMAX 0.4 MG Caps capsule  Generic drug:  tamsulosin  Take 0.4 mg by mouth daily.     losartan-hydrochlorothiazide 50-12.5 MG tablet  Commonly known as:  HYZAAR     multivitamin capsule  Take 1 capsule by mouth daily.     ondansetron 8 MG tablet  Commonly known as:  ZOFRAN  Take 1 tablet (8 mg total) by mouth every 8 (eight) hours as needed for nausea or vomiting.     pantoprazole 40 MG tablet  Commonly known as:  PROTONIX  TAKE ONE TABLET BY MOUTH ONCE DAILY     pomalidomide 3 MG capsule  Commonly known as:  POMALYST  Take 1 capsule (3 mg total) by mouth daily. Take with water on days 1-21. Repeat every 28 days.     rOPINIRole 0.25 MG tablet  Commonly known as:  REQUIP  TAKE ONE  TO TWO TABLETS BY MOUTH ONCE DAILY AT BEDTIME AS NEEDED FOR  RESTLESS  LEGS     saw palmetto 160 MG capsule  Take 320 mg by mouth daily.     vitamin B-12 100 MCG tablet  Commonly known as:  CYANOCOBALAMIN  Take 100 mcg by mouth daily.         PHYSICAL EXAMINATION  Oncology Vitals 05/27/2016 05/21/2016  Height - 173 cm  Weight - 76.567 kg  Weight (lbs) - 168 lbs 13 oz  BMI (kg/m2) - 25.67 kg/m2  Temp 98.8 97.8  Pulse 93 94  Resp 18 17  SpO2 99 99  BSA (m2) - 1.92 m2   BP Readings from Last 2 Encounters:  05/27/16 125/63  05/21/16 119/62    Physical Exam  Constitutional: He is oriented to person, place, and time and well-developed, well-nourished, and in no distress.  HENT:  Head: Normocephalic and atraumatic.  Eyes: EOM are normal. Pupils are equal, round, and reactive to light. Right eye exhibits no discharge. Left eye exhibits no discharge. No scleral icterus.  Bilateral scleral erythema; but no obvious discharge noted.  Also, patient has a stye to the right lower eyelid.  Neck: Normal range of motion. Neck supple.  Pulmonary/Chest: Effort normal. No respiratory distress.  Musculoskeletal: Normal range of motion. He exhibits no edema or tenderness.  Neurological: He is alert and oriented to person, place, and time. Gait normal.  Patient was able to answer all questions appropriately; and follow all commands.  However, he did appear slightly anxious-but not confused.  Skin: Skin is warm and dry. No rash noted. No erythema. No pallor.  Psychiatric: Affect normal.  Nursing note and vitals reviewed.   LABORATORY DATA:. Appointment on 05/27/2016  Component Date Value Ref Range Status  . Sodium 05/27/2016 124* 136 - 145 mEq/L Final  . Potassium 05/27/2016 3.1* 3.5 - 5.1 mEq/L Final  . Chloride 05/27/2016 92* 98 - 109 mEq/L Final  . CO2 05/27/2016 24  22 - 29 mEq/L Final  . Glucose 05/27/2016 125  70 - 140 mg/dl Final   Glucose reference range is for nonfasting patients.  Fasting glucose reference range is 70- 100.  Marland Kitchen BUN 05/27/2016 9.8  7.0 - 26.0 mg/dL Final  . Creatinine 05/27/2016 0.9  0.7 - 1.3 mg/dL Final  . Total Bilirubin 05/27/2016 1.21* 0.20 - 1.20 mg/dL Final  . Alkaline Phosphatase 05/27/2016 67  40 - 150 U/L Final  . AST 05/27/2016 23  5 - 34 U/L Final  . ALT 05/27/2016 23  0 - 55 U/L Final  . Total Protein 05/27/2016 5.9* 6.4 - 8.3 g/dL Final  . Albumin  05/27/2016 3.5  3.5 - 5.0 g/dL Final  . Calcium 05/27/2016 9.0  8.4 - 10.4 mg/dL Final  . Anion Gap 05/27/2016 8  3 - 11 mEq/L Final  . EGFR 05/27/2016 81* >90 ml/min/1.73 m2 Final   eGFR is calculated using the CKD-EPI Creatinine Equation (2009)  . Magnesium 05/27/2016 1.9  1.5 - 2.5 mg/dl Final  Appointment on 05/27/2016  Component Date Value Ref Range Status  . WBC 05/27/2016 4.3  4.0 - 10.3 10e3/uL Final  . NEUT# 05/27/2016 3.0  1.5 - 6.5 10e3/uL Final  . HGB 05/27/2016 9.7* 13.0 - 17.1 g/dL Final  . HCT 05/27/2016 27.0* 38.4 - 49.9 % Final  . Platelets 05/27/2016 62* 140 - 400 10e3/uL Final  . MCV 05/27/2016 95.1  79.3 - 98.0 fL Final  . MCH 05/27/2016 34.2* 27.2 - 33.4 pg Final  . MCHC 05/27/2016 35.9  32.0 - 36.0 g/dL Final  . RBC 05/27/2016 2.84* 4.20 - 5.82 10e6/uL Final  . RDW 05/27/2016 14.4  11.0 - 14.6 % Final  . lymph# 05/27/2016 0.7* 0.9 - 3.3 10e3/uL Final  . MONO# 05/27/2016 0.6  0.1 - 0.9 10e3/uL Final  . Eosinophils Absolute 05/27/2016 0.0  0.0 - 0.5 10e3/uL Final  . Basophils Absolute 05/27/2016 0.0  0.0 - 0.1 10e3/uL Final  . NEUT% 05/27/2016 68.6  39.0 - 75.0 % Final  . LYMPH% 05/27/2016 16.2  14.0 - 49.0 % Final  . MONO% 05/27/2016 14.3* 0.0 - 14.0 % Final  . EOS% 05/27/2016 0.9  0.0 - 7.0 % Final  . BASO% 05/27/2016 0.0  0.0 - 2.0 % Final    RADIOGRAPHIC STUDIES: No results found.  ASSESSMENT/PLAN:    Pancytopenia (HCC) Blood counts obtained today reveal a WBC of 4.3, ANC 3.0, hemoglobin 9.7, and platelet count has decreased from 117 down to 62.  Patient  denies any worsening issues with either easy bleeding or bruising.  Most likely, patient's pancytopenia is secondary to progression of his multiple myeloma.  Awaiting Pomalyst to initiate therapy at this time.  Will continue to monitor closely.  Multiple myeloma ([place]) Patient is status post Cytoxan/Velcade/dexamethasone therapy; in which she progressed with his multiple myeloma.  He is currently awaiting the arrival of the Pomalyst oral therapy to begin his first cycle.  Patient presented to the [facility] today with severe dehydration and increased confusion.  His daughter states the patient was actually almost delusional over the weekend.  Sodium was found to be 124 today.  Patient received IV fluid rehydration while at the [facility] today; and his thoughts did appear clear.  He continues to have no complaints whatsoever.  Patient last received Zometa on 05/21/2016; and will be due for his next cycle of Zometa infusion around 06/20/2016.  Patient will need to cancel all future Velcade injections.    Hypokalemia Potassium was 3.1 today.  Patient does admit to decreased oral intake and dehydration today.  He was given potassium 40 mEq orally; which he took with no difficulty today.  Patient was encouraged to push potassium-rich diet; and will continue to monitor closely.  Hyperbilirubinemia Bilirubin remains elevated; but slightly improved.  Bilirubin was 1.65; and is now 1.21.  Will continue to monitor closely.  Hordeolum externum (stye) Patient has a stye to the right lower eyelid.  He also has some erythema to his bilateral sclera.  His daughter states that he has been having some purulent yellow/green discharge from both eyes for the last few days.  He has been  using some old/out dated antibiotic eyedrops dated to have expired in 2006.  He denies any vision changes.  He denies any recent fevers or chills.  Patient was prescribed polysporin ophthalmic eye ointment to use to the stye  and bilateral eyes.  He was advised to go directly to Dr. for any worsening symptoms whatsoever.  Dehydration Patient is status post Cytoxan/Velcade/dexamethasone therapy; in which she progressed with his multiple myeloma.  He is currently awaiting the arrival of the Pomalyst oral therapy to begin his first cycle.  Patient presented to the [facility] today with severe dehydration and increased confusion.  His daughter states the patient was actually almost delusional over the weekend.  Sodium was found to be 124 today.  Patient received IV fluid rehydration while at the [facility] today; and his thoughts did appear much clearer.  He continues to have no complaints whatsoever.  Patient last received Zometa on 05/21/2016; and will be due for his next cycle of Zometa infusion around 06/20/2016.  Patient will need to cancel all future Velcade injections.  Also, patient gave a urine sample to check a urine sodium.  Those results are pending.  Will review with Dr. Benay Spice.  The plan for patient to return and then confirmed with patient.   Patient stated understanding of all instructions; and was in agreement with this plan of care. The patient knows to call the clinic with any problems, questions or concerns.   Total time spent with patient was 40 minutes;  with greater than 75 percent of that time spent in face to face counseling regarding patient's symptoms,  and coordination of care and follow up.  Disclaimer:This dictation was prepared with Dragon/digital dictation along with [employer]. Any transcriptional errors that result from this process are unintentional.  Drue Second, NP 05/28/2016

## 2016-05-28 NOTE — Assessment & Plan Note (Signed)
Blood counts obtained today reveal a WBC of 4.3, ANC 3.0, hemoglobin 9.7, and platelet count has decreased from 117 down to 62.  Patient denies any worsening issues with either easy bleeding or bruising.  Most likely, patient's pancytopenia is secondary to progression of his multiple myeloma.  Awaiting Pomalyst to initiate therapy at this time.  Will continue to monitor closely.

## 2016-05-28 NOTE — Telephone Encounter (Signed)
called pts daughter.. no answsering machine so I can not leave a msg.. called pt and he refused to take the message, said only his daughter takes his calls... can not get a hold of daughter or leave a msg... apt will be

## 2016-05-28 NOTE — Assessment & Plan Note (Signed)
Patient is status post Cytoxan/Velcade/dexamethasone therapy; in which she progressed with his multiple myeloma.  He is currently awaiting the arrival of the Pomalyst oral therapy to begin his first cycle.  Patient presented to the [facility] today with severe dehydration and increased confusion.  His daughter states the patient was actually almost delusional over the weekend.  Sodium was found to be 124 today.  Patient received IV fluid rehydration while at the [facility] today; and his thoughts did appear clear.  He continues to have no complaints whatsoever.  Patient last received Zometa on 05/21/2016; and will be due for his next cycle of Zometa infusion around 06/20/2016.  Patient will need to cancel all future Velcade injections.

## 2016-05-28 NOTE — Assessment & Plan Note (Signed)
Potassium was 3.1 today.  Patient does admit to decreased oral intake and dehydration today.  He was given potassium 40 mEq orally; which he took with no difficulty today.  Patient was encouraged to push potassium-rich diet; and will continue to monitor closely.

## 2016-05-28 NOTE — Assessment & Plan Note (Signed)
Bilirubin remains elevated; but slightly improved.  Bilirubin was 1.65; and is now 1.21.  Will continue to monitor closely.

## 2016-05-28 NOTE — Telephone Encounter (Signed)
Received call from pt's daughter, Sandria Senter stating that they saw Retta Mac yest & had IVF & they are trying to get Pomalyst through [facility] & they need a prior auth sent stat.  She states she left a message on Tanya's vm regarding this with phone/fax #.  Message left for Tanya & Raquel in [place] to f/u.

## 2016-05-28 NOTE — Telephone Encounter (Signed)
Returned call to pt's daughter, informed her managed care has faxed clinical documentation to [employer] for prior auth.  Informed her of elevated urine sodium level. Indicative of SIADH, progression of myeloma. Will need to begin treatment ASAP. Rip Harbour plans to go out of town on 6/23, she is concerned medication will not arrive before then.  She has been following up with pharmacy.  She understands to bring pt in to be seen if confusion/ delusional behavior reoccurs/ worsens.

## 2016-05-28 NOTE — Assessment & Plan Note (Signed)
Patient has a stye to the right lower eyelid.  He also has some erythema to his bilateral sclera.  His daughter states that he has been having some purulent yellow/green discharge from both eyes for the last few days.  He has been using some old/out dated antibiotic eyedrops dated to have expired in 2006.  He denies any vision changes.  He denies any recent fevers or chills.  Patient was prescribed polysporin ophthalmic eye ointment to use to the stye and bilateral eyes.  He was advised to go directly to Dr. for any worsening symptoms whatsoever.

## 2016-05-28 NOTE — Progress Notes (Signed)
Faxed [facility] [phone] notes/labs--STAT for pomaylst - see prev notes.

## 2016-05-28 NOTE — Assessment & Plan Note (Signed)
Patient is status post Cytoxan/Velcade/dexamethasone therapy; in which she progressed with his multiple myeloma.  He is currently awaiting the arrival of the Pomalyst oral therapy to begin his first cycle.  Patient presented [facility] the cancer Center today with severe dehydration and increased confusion.  His daughter states the patient was actually almost delusional over the weekend.  Sodium was found to be 124 today.  Patient received IV fluid rehydration while at the cancer Center today; and his thoughts did appear much clearer.  He continues to have no complaints whatsoever.  Patient last received Zometa on 05/21/2016; and will be due for his next cycle of Zometa infusion around 06/20/2016.  Patient will need to cancel all future Velcade injections.  Also, patient gave a urine sample to check a urine sodium.  Those results are pending.  Will review with Dr. Sherrill.  The plan for patient to return and then confirmed with patient. 

## 2016-05-29 ENCOUNTER — Telehealth: Payer: Self-pay | Admitting: *Deleted

## 2016-05-29 NOTE — Telephone Encounter (Signed)
Call received from patients daughter requesting that patient [employer] form be faxed back tonight for Pomalyst.  Form signed by Dr. Alen Blew for Dr. Benay Spice.  Form faxed to [facility] at [phone].  Patient's daughter appreciative of assistance.

## 2016-05-30 ENCOUNTER — Encounter: Payer: Self-pay | Admitting: Oncology

## 2016-05-30 ENCOUNTER — Other Ambulatory Visit: Payer: Medicare Other

## 2016-05-30 ENCOUNTER — Ambulatory Visit: Payer: Medicare Other

## 2016-05-30 NOTE — Progress Notes (Signed)
Rcvd approval letter from the Patient Richard [name].  Pt is approved for drugs associated w/ his Dx for 12 months from 05/29/16 for $10,000.

## 2016-05-31 ENCOUNTER — Telehealth: Payer: Self-pay | Admitting: *Deleted

## 2016-05-31 ENCOUNTER — Telehealth: Payer: Self-pay | Admitting: Pharmacist

## 2016-05-31 NOTE — Telephone Encounter (Signed)
Call received from patients daughter inquiring if Decadron dose should be taken again today.  Decadron 40 mg dose taken on 05/27/16 per pt.'s daughter.  Per Dr. Benay Spice, pt is to wait until Monday, 06/03/16 to take next dose of Decadron.  Pt.'s daughter notified of MD instructions and is appreciative of call.

## 2016-05-31 NOTE — Telephone Encounter (Signed)
I received a fax from [facility] stating Dr. Benay Spice needed to complete prescriber survey w/ 1st [facility] for Pomalyst.  This was done and auth [id number] recv'd on 05/24/16. [facility] filled Rx and pt should have recv'd his supply yesterday (05/30/16). I attempted to reach pts dtr to ensure receipt of Pomalyst and also provide counseling but no answer at either number we have listed & I was unable to leave a message (no voicemail available). Will retry early next week. Kennith Center, Pharm.D., CPP 05/31/2016@9 :48 AM Oral [facility]

## 2016-06-02 ENCOUNTER — Other Ambulatory Visit: Payer: Self-pay | Admitting: Oncology

## 2016-06-02 ENCOUNTER — Inpatient Hospital Stay (HOSPITAL_COMMUNITY)
Admission: EM | Admit: 2016-06-02 | Discharge: 2016-06-05 | DRG: 470 | Disposition: A | Discharge status: Skilled Nursing Facility | Payer: Medicare Other | Attending: Internal Medicine | Admitting: Internal Medicine

## 2016-06-02 ENCOUNTER — Emergency Department (HOSPITAL_COMMUNITY): Payer: Medicare Other

## 2016-06-02 ENCOUNTER — Other Ambulatory Visit: Payer: Self-pay

## 2016-06-02 ENCOUNTER — Inpatient Hospital Stay (HOSPITAL_COMMUNITY): Payer: Medicare Other

## 2016-06-02 ENCOUNTER — Encounter (HOSPITAL_COMMUNITY): Payer: Self-pay | Admitting: Emergency Medicine

## 2016-06-02 ENCOUNTER — Encounter (HOSPITAL_COMMUNITY)
Admission: EM | Disposition: A | Discharge status: Skilled Nursing Facility | Payer: Self-pay | Source: Home / Self Care | Attending: Internal Medicine

## 2016-06-02 DIAGNOSIS — I350 Nonrheumatic aortic (valve) stenosis: Secondary | ICD-10-CM | POA: Diagnosis present

## 2016-06-02 DIAGNOSIS — Z7982 Long term (current) use of aspirin: Secondary | ICD-10-CM

## 2016-06-02 DIAGNOSIS — S72009A Fracture of unspecified part of neck of unspecified femur, initial encounter for closed fracture: Secondary | ICD-10-CM

## 2016-06-02 DIAGNOSIS — K219 Gastro-esophageal reflux disease without esophagitis: Secondary | ICD-10-CM | POA: Diagnosis present

## 2016-06-02 DIAGNOSIS — C9002 Multiple myeloma in relapse: Secondary | ICD-10-CM | POA: Diagnosis not present

## 2016-06-02 DIAGNOSIS — D630 Anemia in neoplastic disease: Secondary | ICD-10-CM | POA: Diagnosis not present

## 2016-06-02 DIAGNOSIS — R509 Fever, unspecified: Secondary | ICD-10-CM

## 2016-06-02 DIAGNOSIS — R4182 Altered mental status, unspecified: Secondary | ICD-10-CM | POA: Insufficient documentation

## 2016-06-02 DIAGNOSIS — E871 Hypo-osmolality and hyponatremia: Secondary | ICD-10-CM | POA: Diagnosis present

## 2016-06-02 DIAGNOSIS — D696 Thrombocytopenia, unspecified: Secondary | ICD-10-CM | POA: Diagnosis not present

## 2016-06-02 DIAGNOSIS — Z85828 Personal history of other malignant neoplasm of skin: Secondary | ICD-10-CM

## 2016-06-02 DIAGNOSIS — Z96641 Presence of right artificial hip joint: Secondary | ICD-10-CM | POA: Diagnosis present

## 2016-06-02 DIAGNOSIS — D61818 Other pancytopenia: Secondary | ICD-10-CM | POA: Diagnosis present

## 2016-06-02 DIAGNOSIS — W19XXXA Unspecified fall, initial encounter: Secondary | ICD-10-CM | POA: Diagnosis present

## 2016-06-02 DIAGNOSIS — E785 Hyperlipidemia, unspecified: Secondary | ICD-10-CM | POA: Diagnosis present

## 2016-06-02 DIAGNOSIS — C9000 Multiple myeloma not having achieved remission: Secondary | ICD-10-CM | POA: Diagnosis present

## 2016-06-02 DIAGNOSIS — M84459A Pathological fracture, hip, unspecified, initial encounter for fracture: Secondary | ICD-10-CM | POA: Diagnosis not present

## 2016-06-02 DIAGNOSIS — E860 Dehydration: Secondary | ICD-10-CM | POA: Diagnosis present

## 2016-06-02 DIAGNOSIS — Z87891 Personal history of nicotine dependence: Secondary | ICD-10-CM

## 2016-06-02 DIAGNOSIS — R410 Disorientation, unspecified: Secondary | ICD-10-CM | POA: Insufficient documentation

## 2016-06-02 DIAGNOSIS — Z8521 Personal history of malignant neoplasm of larynx: Secondary | ICD-10-CM

## 2016-06-02 DIAGNOSIS — Z79899 Other long term (current) drug therapy: Secondary | ICD-10-CM | POA: Diagnosis not present

## 2016-06-02 DIAGNOSIS — S72002A Fracture of unspecified part of neck of left femur, initial encounter for closed fracture: Principal | ICD-10-CM | POA: Diagnosis present

## 2016-06-02 DIAGNOSIS — N400 Enlarged prostate without lower urinary tract symptoms: Secondary | ICD-10-CM | POA: Diagnosis present

## 2016-06-02 DIAGNOSIS — E876 Hypokalemia: Secondary | ICD-10-CM | POA: Diagnosis present

## 2016-06-02 DIAGNOSIS — I4891 Unspecified atrial fibrillation: Secondary | ICD-10-CM | POA: Diagnosis present

## 2016-06-02 DIAGNOSIS — D62 Acute posthemorrhagic anemia: Secondary | ICD-10-CM | POA: Diagnosis not present

## 2016-06-02 DIAGNOSIS — K449 Diaphragmatic hernia without obstruction or gangrene: Secondary | ICD-10-CM | POA: Diagnosis present

## 2016-06-02 DIAGNOSIS — I10 Essential (primary) hypertension: Secondary | ICD-10-CM | POA: Diagnosis present

## 2016-06-02 DIAGNOSIS — Z8249 Family history of ischemic heart disease and other diseases of the circulatory system: Secondary | ICD-10-CM

## 2016-06-02 DIAGNOSIS — Y92009 Unspecified place in unspecified non-institutional (private) residence as the place of occurrence of the external cause: Secondary | ICD-10-CM | POA: Diagnosis not present

## 2016-06-02 DIAGNOSIS — Z807 Family history of other malignant neoplasms of lymphoid, hematopoietic and related tissues: Secondary | ICD-10-CM | POA: Diagnosis not present

## 2016-06-02 DIAGNOSIS — Z8546 Personal history of malignant neoplasm of prostate: Secondary | ICD-10-CM | POA: Diagnosis not present

## 2016-06-02 DIAGNOSIS — S72002D Fracture of unspecified part of neck of left femur, subsequent encounter for closed fracture with routine healing: Secondary | ICD-10-CM

## 2016-06-02 DIAGNOSIS — Z923 Personal history of irradiation: Secondary | ICD-10-CM

## 2016-06-02 DIAGNOSIS — D6959 Other secondary thrombocytopenia: Secondary | ICD-10-CM | POA: Diagnosis present

## 2016-06-02 DIAGNOSIS — M25552 Pain in left hip: Secondary | ICD-10-CM | POA: Diagnosis present

## 2016-06-02 LAB — ECHOCARDIOGRAM COMPLETE
AOVTI: 38.6 cm
AV Area VTI: 1.47 cm2
AV Area mean vel: 1.57 cm2
AV VEL mean LVOT/AV: 0.45
AV area mean vel ind: 0.81 cm2/m2
AVAREAVTIIND: 0.95 cm2/m2
AVG: 14 mmHg
AVPG: 22 mmHg
AVPKVEL: 237 cm/s
Ao pk vel: 0.43 m/s
CHL CUP AV PEAK INDEX: 0.76
CHL CUP AV VEL: 1.84
CHL CUP MV DEC (S): 313
CHL CUP TV REG PEAK VELOCITY: 251 cm/s
DOP CAL AO MEAN VELOCITY: 168 cm/s
E/e' ratio: 8.11
EWDT: 313 ms
FS: 41 % (ref 28–44)
Height: 68 in
IV/PV OW: 1.08
LA vol index: 24.4 mL/m2
LA vol: 47.3 mL
LADIAMINDEX: 1.6 cm/m2
LASIZE: 31 mm
LAVOLA4C: 49 mL
LEFT ATRIUM END SYS DIAM: 31 mm
LVEEAVG: 8.11
LVEEMED: 8.11
LVELAT: 7.83 cm/s
LVOT VTI: 20.5 cm
LVOT area: 3.46 cm2
LVOT peak VTI: 0.53 cm
LVOT peak vel: 101 cm/s
LVOTD: 21 mm
LVOTSV: 71 mL
MV pk A vel: 86.8 m/s
MVPKEVEL: 63.5 m/s
PW: 12.6 mm — AB (ref 0.6–1.1)
RV TAPSE: 25.2 mm
TDI e' lateral: 7.83
TR max vel: 251 cm/s
Valve area index: 0.95
Valve area: 1.84 cm2
Weight: 2720 oz

## 2016-06-02 LAB — I-STAT CHEM 8, ED
BUN: 17 mg/dL (ref 6–20)
CALCIUM ION: 1.32 mmol/L — AB (ref 1.13–1.30)
CHLORIDE: 81 mmol/L — AB (ref 101–111)
Creatinine, Ser: 0.8 mg/dL (ref 0.61–1.24)
Glucose, Bld: 129 mg/dL — ABNORMAL HIGH (ref 65–99)
HCT: 29 % — ABNORMAL LOW (ref 39.0–52.0)
Hemoglobin: 9.9 g/dL — ABNORMAL LOW (ref 13.0–17.0)
Potassium: 3.4 mmol/L — ABNORMAL LOW (ref 3.5–5.1)
SODIUM: 122 mmol/L — AB (ref 135–145)
TCO2: 29 mmol/L (ref 0–100)

## 2016-06-02 LAB — BASIC METABOLIC PANEL
ANION GAP: 6 (ref 5–15)
Anion gap: 7 (ref 5–15)
BUN: 16 mg/dL (ref 6–20)
BUN: 17 mg/dL (ref 4–21)
BUN: 18 mg/dL (ref 6–20)
CHLORIDE: 89 mmol/L — AB (ref 101–111)
CO2: 30 mmol/L (ref 22–32)
CO2: 28 mmol/L (ref 22–32)
CREATININE: 0.8 mg/dL (ref 0.6–1.3)
Calcium: 9.8 mg/dL (ref 8.9–10.3)
Calcium: 9.4 mg/dL (ref 8.9–10.3)
Chloride: 88 mmol/L — ABNORMAL LOW (ref 101–111)
Creatinine, Ser: 0.83 mg/dL (ref 0.61–1.24)
Creatinine, Ser: 0.94 mg/dL (ref 0.61–1.24)
GFR calc Af Amer: >60 mL/min (ref >60)
GFR calc Af Amer: >60 mL/min (ref >60)
GFR calc non Af Amer: >60 mL/min (ref >60)
GLUCOSE: 120 mg/dL — AB (ref 65–99)
Glucose, Bld: 125 mg/dL — ABNORMAL HIGH (ref 65–99)
Glucose: 129 mg/dL
POTASSIUM: 3.8 mmol/L (ref 3.5–5.1)
POTASSIUM: 4.1 mmol/L (ref 3.5–5.1)
Potassium: 3.4 mmol/L (ref 3.4–5.3)
SODIUM: 124 mmol/L — AB (ref 135–145)
Sodium: 122 mmol/L — AB (ref 137–147)
Sodium: 124 mmol/L — ABNORMAL LOW (ref 135–145)

## 2016-06-02 LAB — URINALYSIS, ROUTINE W REFLEX MICROSCOPIC
Bilirubin Urine: NEGATIVE
Glucose, UA: NEGATIVE mg/dL
KETONES UR: NEGATIVE mg/dL
LEUKOCYTES UA: NEGATIVE
NITRITE: NEGATIVE
PH: 7 (ref 5.0–8.0)
PROTEIN: NEGATIVE mg/dL
Specific Gravity, Urine: 1.015 (ref 1.005–1.030)

## 2016-06-02 LAB — TYPE AND SCREEN
ABO/RH(D): O NEG
Antibody Screen: NEGATIVE

## 2016-06-02 LAB — CBC WITH DIFFERENTIAL/PLATELET
BASOS PCT: 0 %
Basophils Absolute: 0 10*3/uL (ref 0.0–0.1)
EOS PCT: 0 %
Eosinophils Absolute: 0 10*3/uL (ref 0.0–0.7)
HEMATOCRIT: 28.2 % — AB (ref 39.0–52.0)
HEMOGLOBIN: 10.4 g/dL — AB (ref 13.0–17.0)
LYMPHS PCT: 6 %
Lymphs Abs: 0.6 10*3/uL — ABNORMAL LOW (ref 0.7–4.0)
MCH: 34.2 pg — ABNORMAL HIGH (ref 26.0–34.0)
MCHC: 36.9 g/dL — ABNORMAL HIGH (ref 30.0–36.0)
MCV: 92.8 fL (ref 78.0–100.0)
MONOS PCT: 6 %
Monocytes Absolute: 0.6 10*3/uL (ref 0.1–1.0)
NEUTROS PCT: 88 %
Neutro Abs: 8.2 10*3/uL — ABNORMAL HIGH (ref 1.7–7.7)
Platelets: 81 10*3/uL — ABNORMAL LOW (ref 150–400)
RBC: 3.04 MIL/uL — ABNORMAL LOW (ref 4.22–5.81)
RDW: 14.5 % (ref 11.5–15.5)
WBC: 9.4 10*3/uL (ref 4.0–10.5)

## 2016-06-02 LAB — OSMOLALITY: OSMOLALITY: 257 mosm/kg — AB (ref 275–295)

## 2016-06-02 LAB — CBC AND DIFFERENTIAL
HEMATOCRIT: 28 % — AB (ref 41–53)
Hemoglobin: 10.4 g/dL — AB (ref 13.5–17.5)
PLATELETS: 81 10*3/uL — AB (ref 150–399)
WBC: 9.4 10^3/mL

## 2016-06-02 LAB — CBC
HEMATOCRIT: 27.4 % — AB (ref 39.0–52.0)
HEMOGLOBIN: 10 g/dL — AB (ref 13.0–17.0)
MCH: 35.7 pg — ABNORMAL HIGH (ref 26.0–34.0)
MCHC: 36.5 g/dL — AB (ref 30.0–36.0)
MCV: 97.9 fL (ref 78.0–100.0)
Platelets: 85 10*3/uL — ABNORMAL LOW (ref 150–400)
RBC: 2.8 MIL/uL — AB (ref 4.22–5.81)
RDW: 14.9 % (ref 11.5–15.5)
WBC: 6.3 10*3/uL (ref 4.0–10.5)

## 2016-06-02 LAB — URINE MICROSCOPIC-ADD ON
Bacteria, UA: NONE SEEN
RBC / HPF: NONE SEEN RBC/hpf (ref 0–5)
SQUAMOUS EPITHELIAL / LPF: NONE SEEN

## 2016-06-02 LAB — LACTIC ACID, PLASMA
LACTIC ACID, VENOUS: 1.2 mmol/L (ref 0.5–2.0)
LACTIC ACID, VENOUS: 1.1 mmol/L (ref 0.5–2.0)

## 2016-06-02 LAB — SURGICAL PCR SCREEN
MRSA, PCR: NEGATIVE
STAPHYLOCOCCUS AUREUS: NEGATIVE

## 2016-06-02 SURGERY — HEMIARTHROPLASTY, HIP, DIRECT ANTERIOR APPROACH, FOR FRACTURE
Anesthesia: General | Laterality: Left

## 2016-06-02 MED ORDER — POMALIDOMIDE 3 MG PO CAPS
3.0000 mg | ORAL_CAPSULE | Freq: Every day | ORAL | Status: DC
Start: 2016-06-02 — End: 2016-06-05
  Administered 2016-06-02 – 2016-06-05 (×3): 3 mg via ORAL

## 2016-06-02 MED ORDER — ACETAMINOPHEN 325 MG PO TABS
650.0000 mg | ORAL_TABLET | Freq: Four times a day (QID) | ORAL | Status: DC | PRN
  Administered 2016-06-02: 650 mg via ORAL
  Administered 2016-06-03: 1000 mg via ORAL
  Filled 2016-06-02: qty 2

## 2016-06-02 MED ORDER — SODIUM CHLORIDE 0.9 % IV SOLN
INTRAVENOUS | Status: DC
  Administered 2016-06-02 (×2): via INTRAVENOUS
  Administered 2016-06-03: 75 mL/h via INTRAVENOUS
  Administered 2016-06-05: 05:00:00 via INTRAVENOUS

## 2016-06-02 MED ORDER — LIP MEDEX EX OINT
TOPICAL_OINTMENT | CUTANEOUS | Status: AC
  Administered 2016-06-02: 1
  Filled 2016-06-02: qty 7

## 2016-06-02 MED ORDER — PANTOPRAZOLE SODIUM 40 MG PO TBEC
40.0000 mg | DELAYED_RELEASE_TABLET | Freq: Every day | ORAL | Status: DC
  Administered 2016-06-02 – 2016-06-05 (×4): 40 mg via ORAL
  Filled 2016-06-02 (×4): qty 1

## 2016-06-02 MED ORDER — POTASSIUM CHLORIDE CRYS ER 20 MEQ PO TBCR: 20.0000 meq | EXTENDED_RELEASE_TABLET | ORAL | Status: AC

## 2016-06-02 MED ORDER — ADULT MULTIVITAMIN W/MINERALS CH
1.0000 | ORAL_TABLET | Freq: Every morning | ORAL | Status: DC
  Administered 2016-06-02 – 2016-06-05 (×4): 1 via ORAL
  Filled 2016-06-02 (×4): qty 1

## 2016-06-02 MED ORDER — DEXAMETHASONE 6 MG PO TABS
40.0000 mg | ORAL_TABLET | ORAL | Status: DC
  Administered 2016-06-03: 40 mg via ORAL
  Filled 2016-06-02: qty 1

## 2016-06-02 MED ORDER — MORPHINE SULFATE (PF) 2 MG/ML IV SOLN
1.0000 mg | INTRAVENOUS | Status: DC | PRN
  Administered 2016-06-02 – 2016-06-03 (×15): 2 mg via INTRAVENOUS
  Filled 2016-06-02 (×15): qty 1

## 2016-06-02 MED ORDER — SODIUM CHLORIDE 0.9 % IV SOLN: INTRAVENOUS | Status: DC

## 2016-06-02 MED ORDER — SODIUM CHLORIDE 0.9 % IV SOLN
INTRAVENOUS | Status: DC
  Administered 2016-06-02: 04:00:00 via INTRAVENOUS

## 2016-06-02 MED ORDER — SODIUM CHLORIDE 1 G PO TABS
1.0000 g | ORAL_TABLET | Freq: Three times a day (TID) | ORAL | Status: DC
  Administered 2016-06-02 – 2016-06-05 (×8): 1 g via ORAL
  Filled 2016-06-02 (×13): qty 1

## 2016-06-02 MED ORDER — MORPHINE SULFATE (PF) 2 MG/ML IV SOLN
2.0000 mg | INTRAVENOUS | Status: DC | PRN
  Administered 2016-06-02: 2 mg via INTRAVENOUS
  Filled 2016-06-02: qty 1

## 2016-06-02 NOTE — ED Notes (Signed)
Attempts made to update RN on Richard [name] regarding patient, secretary unable to find nurse at this time, she will return call.

## 2016-06-02 NOTE — Progress Notes (Signed)
Called family, Dr. Maureen Ralphs did his right hip and they have requested [facility].   Spoken with Dr. Onnie Graham, and will keep at [facility] for possible surgery with Dr. Maureen Ralphs, possibly Monday if optimized.  From the chart may benefit from hydration and normalization of sodium and possibly having platelets available, in addition to blood for OR given pancytopenia, as well as checking with his oncologist.   Johnny Bridge, MD

## 2016-06-02 NOTE — H&P (Addendum)
History and Physical    Richard [name] [id number]:[id number] DOB: [birth year] DOA: 06/02/2016  Referring MD/NP/PA: Dr. Marisue Humble PCP: Rachell Cipro, MD  Patient coming from: Home  Chief Complaint: Fall  HPI: Richard [name] is a 82 y.o. male with medical history significant of multiple myeloma followed by Dr. Benay Spice, prostate cancer, hyponatremia, HTN, HLD, and atrial fibrillation; who presents after having a fall at home yesterday evening while trying to use the restroom. Patient notes that he had tried to get up, but was unable to bear weight due to pain in the left hip. He denies any loss of consciousness or trauma to his head with the fall. He notes that his daughter had advised him to drink more water as it appears he had become dehydrated previously.  Associated symptoms include some urinary frequency, hesitancy, incomplete evacuation, and some mild confusion. Patient denies shortness of breath, chest pain, nausea, vomiting, or abdominal pain. He notes that he previously had to have a right hip replacement which was performed by Dr. Wynelle Link.   ED Course:  EMS made note that there were multiple water bottles laying around the patient's home from their arrival. En route patient had been given 100 mcg of fentanyl and had been noted to become hypoxic for which she was placed on nasal cannula oxygen with improvement of O2 sat saturations. X-rays of the left hip revealed a transverse fracture of the left femoral neck. Dr. Mardelle Matte of Orthopedics was consulted and requested the patient be transferred to [facility]. [facility] called to admit.  Review of Systems: As per HPI otherwise 10 point review of systems negative.   Past Medical History  Diagnosis Date  . Hypertension   . Dyslipidemia   . Arrhythmia     atrial fibrillation  . Cholelithiasis   . GERD (gastroesophageal reflux disease)   . Hiatal hernia   . Esophageal stricture   . History of BPH   . History of radiation therapy 01/29/11 -03/15/11   larynx 6600 cGy 33 sessions  . Arthritis   . Anemia     pt's daughter denies  . Multiple myeloma   . 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 Prostate cancer ([place]) 01/23/11    Gleason 3+3=6  . Facial basal cell cancer     nose  . Cataract     bil removed  . Pneumonia     Past Surgical History  Procedure Laterality Date  . Hip surgery    . Back surgery    . Trigeminal neuralgia    . Appendectomy    . Tonsillectomy    . Right chest wall resection      of second ,third,and fourth rib  . Esophageal dilation    . Colonoscopy    . Upper gastrointestinal endoscopy       reports that he quit smoking about 22 years ago. His smoking use included Cigarettes. He has quit using smokeless tobacco. He reports that he does not drink alcohol or use illicit drugs.  Allergies  Allergen Reactions  . Celebrex [Celecoxib] Other (See Comments)    GI bleeding  . Coricidin D Cold-Flu-Sinus  [Chlorphen-Pe-Acetaminophen] Hives    Family History  Problem Relation Age of Onset  . Heart attack Father     age 32  . Multiple myeloma Mother   . Colon cancer Neg Hx   . Esophageal cancer Neg Hx   . Pancreatic cancer Neg Hx   . Prostate cancer Neg Hx   .  Rectal cancer Neg Hx   . Stomach cancer Neg Hx     Prior to Admission medications   Medication Sig Start Date End Date Taking? Authorizing Provider  aspirin 81 MG tablet Take 81 mg by mouth daily.     Yes Historical Provider, MD  Cholecalciferol 5000 UNITS TABS Take 5,000 Units by mouth daily.   Yes Historical Provider, MD  losartan-hydrochlorothiazide Konrad Penta) 50-12.5 MG tablet  04/03/16  Yes Historical Provider, MD  Multiple Vitamin (MULTIVITAMIN) capsule Take 1 capsule by mouth daily.     Yes Historical Provider, MD  rOPINIRole (REQUIP) 0.25 MG tablet TAKE ONE TO TWO TABLETS BY MOUTH ONCE DAILY AT BEDTIME AS NEEDED FOR  RESTLESS  LEGS Patient taking differently: TAKE ONE TABLET BY MOUTH ONCE DAILY AT BEDTIME FOR  RESTLESS  LEGS  05/10/16  Yes Owens Shark, NP  saw palmetto 160 MG capsule Take 320 mg by mouth daily.    Yes Historical Provider, MD  Tamsulosin HCl (FLOMAX) 0.4 MG CAPS Take 0.4 mg by mouth daily.    Yes Historical Provider, MD  vitamin B-12 (CYANOCOBALAMIN) 100 MCG tablet Take 100 mcg by mouth daily.     Yes Historical Provider, MD  bacitracin-polymyxin b (POLYSPORIN) ophthalmic ointment Place 1 application into both eyes 4 (four) times daily. 05/27/16   Susanne Borders, NP  ondansetron (ZOFRAN) 8 MG tablet Take 1 tablet (8 mg total) by mouth every 8 (eight) hours as needed for nausea or vomiting. 11/21/15   Ladell Pier, MD  pantoprazole (PROTONIX) 40 MG tablet TAKE ONE TABLET BY MOUTH ONCE DAILY 03/13/16   Ladell Pier, MD  pomalidomide (POMALYST) 3 MG capsule Take 1 capsule (3 mg total) by mouth daily. Take with water on days 1-21. Repeat every 28 days. 05/27/16   Ladell Pier, MD    Physical Exam:  Constitutional: Elderly male who appears to be in NAD, calm, comfortable Filed Vitals:   06/02/16 0248 06/02/16 0400  BP: 125/70 137/83  Pulse: 106 99  Temp: 98.3 F (36.8 C)   TempSrc: Oral   Resp: 18 22  SpO2: 90% 99%   Eyes: PERRL, lids and conjunctivae normal ENMT: Mucous membranes are moist. Posterior pharynx clear of any exudate or lesions.Normal dentition.  Neck: normal, supple, no masses, no thyromegaly Respiratory: clear to auscultation bilaterally, no wheezing, no crackles. Normal respiratory effort. No accessory muscle use.  Cardiovascular: Regular rate and rhythm, no murmurs / rubs / gallops. No extremity edema. 2+ pedal pulses. No carotid bruits.  Abdomen: no tenderness, no masses palpated. No hepatosplenomegaly. Bowel sounds positive.  Musculoskeletal: no clubbing / cyanosis. Left hip is externally rotated and slightly shortened  Skin: no rashes, lesions, ulcers. No induration Neurologic: CN 2-12 grossly intact. Sensation intact, DTR nPatient able to move all 4 extremities.    Psychiatric: Normal judgment and insight. Alert and oriented x 3. Normal mood.     Labs on Admission: I have personally reviewed following labs and imaging studies  CBC:  Recent Labs Lab 05/27/16 1516 06/02/16 0325 06/02/16 0327  WBC 4.3 9.4  --   NEUTROABS 3.0 8.2*  --   HGB 9.7* 10.4* 9.9*  HCT 27.0* 28.2* 29.0*  MCV 95.1 92.8  --   PLT 62* 81*  --    Basic Metabolic Panel:  Recent Labs Lab 05/27/16 1329 06/02/16 0327  NA 124* 122*  K 3.1* 3.4*  CL  --  81*  CO2 24  --   GLUCOSE 125 129*  BUN 9.8 17  CREATININE 0.9 0.80  CALCIUM 9.0  --   MG 1.9  --    GFR: Estimated Creatinine Clearance: 70.1 mL/min (by C-G formula based on Cr of 0.8). Liver Function Tests:  Recent Labs Lab 05/27/16 1329  AST 23  ALT 23  ALKPHOS 67  BILITOT 1.21*  PROT 5.9*  ALBUMIN 3.5   No results for input(s): LIPASE, AMYLASE in the last 168 hours. No results for input(s): AMMONIA in the last 168 hours. Coagulation Profile: No results for input(s): INR, PROTIME in the last 168 hours. Cardiac Enzymes: No results for input(s): CKTOTAL, CKMB, CKMBINDEX, TROPONINI in the last 168 hours. BNP (last 3 results) No results for input(s): PROBNP in the last 8760 hours. HbA1C: No results for input(s): HGBA1C in the last 72 hours. CBG: No results for input(s): GLUCAP in the last 168 hours. Lipid Profile: No results for input(s): CHOL, HDL, LDLCALC, TRIG, CHOLHDL, LDLDIRECT in the last 72 hours. Thyroid Function Tests: No results for input(s): TSH, T4TOTAL, FREET4, T3FREE, THYROIDAB in the last 72 hours. Anemia Panel: No results for input(s): VITAMINB12, FOLATE, FERRITIN, TIBC, IRON, RETICCTPCT in the last 72 hours. Urine analysis:    Component Value Date/Time   COLORURINE YELLOW 05/16/2016 1949   APPEARANCEUR CLEAR 05/16/2016 1949   LABSPEC 1.016 05/16/2016 1949   PHURINE 7.5 05/16/2016 1949   GLUCOSEU NEGATIVE 05/16/2016 1949   HGBUR NEGATIVE 05/16/2016 1949   BILIRUBINUR  NEGATIVE 05/16/2016 1949   KETONESUR NEGATIVE 05/16/2016 1949   PROTEINUR NEGATIVE 05/16/2016 1949   UROBILINOGEN 1.0 07/26/2010 1230   NITRITE NEGATIVE 05/16/2016 1949   LEUKOCYTESUR NEGATIVE 05/16/2016 1949   Sepsis Labs: No results found for this or any previous visit (from the past 240 hour(s)).   Radiological Exams on Admission: Dg Chest 2 View  06/02/2016  CLINICAL DATA:  Year-old male with fall and left hip fracture per EXAM: CHEST  2 VIEW COMPARISON:  Radiograph dated 05/16/2016 and chest CT dated 11/28/2014 FINDINGS: Two views of the chest do not demonstrate a focal consolidation. There is no pleural effusion or pneumothorax. Postsurgical changes of resection of right upper anterior pleural/chest wall mass seen on the prior CT. The cardiac silhouette is within normal limits. There is degenerative changes of the spine. No acute fracture IMPRESSION: No acute cardiopulmonary process. Electronically Signed   By: Anner Crete M.D.   On: 06/02/2016 04:03   Dg Hip Unilat With Pelvis 2-3 Views Left  06/02/2016  CLINICAL DATA:  82 year old male with fall and left hip pain. EXAM: DG HIP (WITH OR WITHOUT PELVIS) 2-3V LEFT COMPARISON:  None. FINDINGS: There is a transverse fracture of the left femoral neck with mild proximal migration of the femoral shaft. The head of the left femur remains in anatomic alignment with the acetabulum. No other acute fracture identified. The bones are osteopenic. Right hip arthroplasty. IMPRESSION: Transverse fracture of the left femoral neck. Electronically Signed   By: Anner Crete M.D.   On: 06/02/2016 04:01    EKG: Independently reviewed. Sinus tachycardia with background artifact  Assessment/Plan Left displaced femoral neck fracture after fall: Acute. Patient found to have a transverse fracture of the left femoral neck. - Admit to MedSurg bed - Hip fracture protocol initiated - Morphine prn pain - Orthopedics consulted by ED physician Dr.  Mardelle Matte  Multiple myeloma: Patient is followed by Dr. Benay Spice of oncology who appears to be status Cytoxan/Velcade/dexamethasone therapy, and recently started Pomalyst 3 days ago. - Will likely need to notify Dr.  Sherrill in am - Restart Pomalyst   Hyponatremia: Sodium 122 on admission patient previously never been this low in the past. But it appears that patient has been drinking more water lately at the request of his daughter. - Check serum osmolarity - Normal saline  IV fluids at 75 ml/hr - Recheck BMP at a.m.   Hypokalemia: Initial potassium 3.4 on admission - Continue 20 mEq of potassium chloride now   Essential hypertension -  Held Hyzaar secondary to hyponatremia   Pancytopenia: Appears hemoglobin and platelet counts near previous values from 6/19. - Continue to monitor  GERD  - protonix   DVT prophylaxis: SCD   Code Status:  Family Communication: None Disposition Plan: Undetermined Consults called: Orthopedics Admission status: Inpatient Medsurg  Norval Morton MD [facility] Pager [phone]  If 7PM-7AM, please contact night-coverage [email or web address] Password [facility]  06/02/2016, 4:23 AM

## 2016-06-02 NOTE — Progress Notes (Signed)
[facility] PROGRESS NOTE  Richard DILLION DTO:[id number] DOB: September 05, 1933 DOA: 06/02/2016  PCP: Rachell Cipro, MD  Brief HPI: 82 year old Caucasian male with past medical history of multiple myeloma, followed by oncology, hyponatremia, hypertension, atrial fibrillation (not on anticoagulation), presented after a mechanical fall. Resulted in a left femoral fracture. Patient was hospitalized for further management.  Past medical history:  Past Medical History  Diagnosis Date  . Hypertension   . Dyslipidemia   . Arrhythmia     atrial fibrillation  . Cholelithiasis   . GERD (gastroesophageal reflux disease)   . Hiatal hernia   . Esophageal stricture   . History of BPH   . History of radiation therapy 01/29/11 -03/15/11    larynx 6600 cGy 33 sessions  . Arthritis   . Anemia     pt's daughter denies  . Multiple myeloma   . 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 Prostate cancer ([place]) 01/23/11    Gleason 3+3=6  . Facial basal cell cancer     nose  . Cataract     bil removed  . Pneumonia     Consultants: Orthopedics  Procedures: None as yet  Antibiotics: None  Subjective: Patient appears to be pleasantly confused. He complains of 8 out of 10 pain in the left leg. Denies any chest pain, shortness of breath. Denies any nausea or vomiting. His daughter is at the bedside.  Objective:  Vital Signs  Filed Vitals:   06/02/16 0607 06/02/16 0704 06/02/16 0745 06/02/16 1058  BP: 91/63 141/74  118/58  Pulse: 82 98  96  Temp:  100.4 F (38 C)  98.3 F (36.8 C)  TempSrc:  Oral  Oral  Resp: 25 20  20   Height:   5' 8"  (1.727 m)   Weight:   77.111 kg (170 lb)   SpO2: 96% 98%  98%    Intake/Output Summary (Last 24 hours) at 06/02/16 1130 Last data filed at 06/02/16 9833  Gross per 24 hour  Intake      0 ml  Output    275 ml  Net   -275 ml   Filed Weights   06/02/16 0745  Weight: 77.111 kg (170 lb)    General appearance: alert, cooperative,  distracted and no distress Resp: Diminished air entry at the bases. No wheezing, rales or rhonchi. Cardio: S1, S2 is normal, regular. Systolic murmur appreciated over the precordium. No S3, S4. No rubs, or bruit. GI: soft, non-tender; bowel sounds normal; no masses,  no organomegaly Extremities: Left leg is externally rotated. Good pulses. Neurologic: Distracted. No focal neurological deficits.  Lab Results:  Data Reviewed: I have personally reviewed following labs and imaging studies  CBC:  Recent Labs Lab 05/27/16 1516 06/02/16 0325 06/02/16 0327  WBC 4.3 9.4  --   NEUTROABS 3.0 8.2*  --   HGB 9.7* 10.4* 9.9*  HCT 27.0* 28.2* 29.0*  MCV 95.1 92.8  --   PLT 62* 81*  --    Basic Metabolic Panel:  Recent Labs Lab 05/27/16 1329 06/02/16 0327 06/02/16 0804  NA 124* 122* 124*  K 3.1* 3.4* 3.8  CL  --  81* 88*  CO2 24  --  30  GLUCOSE 125 129* 125*  BUN 9.8 17 18   CREATININE 0.9 0.80 0.83  CALCIUM 9.0  --  9.8  MG 1.9  --   --    GFR: Estimated Creatinine Clearance: 67.5 mL/min (by C-G formula based on Cr  of 0.83). Liver Function Tests:  Recent Labs Lab 05/27/16 1329  AST 23  ALT 23  ALKPHOS 67  BILITOT 1.21*  PROT 5.9*  ALBUMIN 3.5   Urine analysis:    Component Value Date/Time   COLORURINE YELLOW 06/02/2016 0423   APPEARANCEUR CLOUDY* 06/02/2016 0423   LABSPEC 1.015 06/02/2016 0423   PHURINE 7.0 06/02/2016 0423   GLUCOSEU NEGATIVE 06/02/2016 0423   HGBUR SMALL* 06/02/2016 0423   BILIRUBINUR NEGATIVE 06/02/2016 0423   KETONESUR NEGATIVE 06/02/2016 0423   PROTEINUR NEGATIVE 06/02/2016 0423   UROBILINOGEN 1.0 07/26/2010 1230   NITRITE NEGATIVE 06/02/2016 0423   LEUKOCYTESUR NEGATIVE 06/02/2016 0423     Radiology Studies: Dg Chest 2 View  06/02/2016  CLINICAL DATA:  Year-old male with fall and left hip fracture per EXAM: CHEST  2 VIEW COMPARISON:  Radiograph dated 05/16/2016 and chest CT dated 11/28/2014 FINDINGS: Two views of the chest do not  demonstrate a focal consolidation. There is no pleural effusion or pneumothorax. Postsurgical changes of resection of right upper anterior pleural/chest wall mass seen on the prior CT. The cardiac silhouette is within normal limits. There is degenerative changes of the spine. No acute fracture IMPRESSION: No acute cardiopulmonary process. Electronically Signed   By: Anner Crete M.D.   On: 06/02/2016 04:03   Dg Hip Unilat With Pelvis 2-3 Views Left  06/02/2016  CLINICAL DATA:  82 year old male with fall and left hip pain. EXAM: DG HIP (WITH OR WITHOUT PELVIS) 2-3V LEFT COMPARISON:  None. FINDINGS: There is a transverse fracture of the left femoral neck with mild proximal migration of the femoral shaft. The head of the left femur remains in anatomic alignment with the acetabulum. No other acute fracture identified. The bones are osteopenic. Right hip arthroplasty. IMPRESSION: Transverse fracture of the left femoral neck. Electronically Signed   By: Anner Crete M.D.   On: 06/02/2016 04:01     Medications:  Scheduled: . [START ON 06/03/2016] dexamethasone  40 mg Oral Weekly  . multivitamin with minerals  1 tablet Oral q morning - 10a  . pantoprazole  40 mg Oral Daily  . pomalidomide  3 mg Oral Daily  . potassium chloride  20 mEq Oral STAT   Continuous: . sodium chloride 75 mL/hr at 06/02/16 0656   DXA:JOINOMVE injection  Assessment/Plan:  Principal Problem:   Left displaced femoral neck fracture (HCC) Active Problems:   Essential hypertension, benign   Multiple myeloma (HCC)   Hypokalemia   Pancytopenia (HCC)    Left displaced femoral neck fracture after mechanical fall Orthopedics has evaluated the patient. Plan is for surgery tomorrow. Old records reviewed. Patient examined. He does have a systolic murmur over the precordium. Echocardiogram from 2012 did suggest mild aortic stenosis. Based on examination, his stenosis does not appear to be severe but we will proceed with  echocardiogram. EKG shows old stable findings. Patient appears to have low O2 moderate level of functioning. If echocardiogram does not show severe aortic stenosis, he may be able to proceed to surgery. He does have hematological disorders which may require support. His oncologist will be notified via Epic.   Multiple myeloma Patient is followed by Dr. Benay Spice of oncology. Patient was recently started on [place] every Monday. Dose will be given tomorrow.  Thrombocytopenia Low platelet counts, likely due to his history of multiple myeloma. Will need transfusion for surgery if counts are below 50,000.  Normocytic anemia Monitor hemoglobin closely. Transfuse as needed.  Hyponatremia Appears to have some  degree of chronic hyponatremia. Apparently he has also been drinking a lot of water recently. Also on hydrochlorothiazide at home. Continue normal saline. Fluid restriction. Repeat sodium levels this afternoon and tomorrow.   Hypokalemia Repeated  Essential hypertension Monitor blood pressures closely. Reasonably well controlled for now. Holding his home medications.  GERD  Protonix  DVT Prophylaxis: SCDs    Code Status: Full code  Family Communication: Discussed with the patient and his daughter  Disposition Plan: Await echocardiogram.    LOS: 0 days   [facility] Pager [phone] 06/02/2016, 11:30 AM  If 7PM-7AM, please contact night-coverage at [email or web address], password [facility]

## 2016-06-02 NOTE — ED Notes (Signed)
Diane,RN on 6E updated on pt status. Pts daughter now at bedside. Holding PO medications until pt is seen by ortho.

## 2016-06-02 NOTE — Progress Notes (Signed)
Patient ID: DWAUN SOSEBEE, male   DOB: [birth year], 82 y.o.   MRN: [id number]    Subjective:   Procedure(s) (LRB): LEFT ARTHROPLASTY BIPOLAR HIP (HEMIARTHROPLASTY) (Left) Patient reports pain as 9 on 0-10 scale.   Denies CP or SOB.  Voiding without difficulty. Positive flatus. Objective: Vital signs in last 24 hours: Temp:  [98.3 F (36.8 C)-100.4 F (38 C)] 100.4 F (38 C) (06/25 0704) Pulse Rate:  [82-106] 98 (06/25 0704) Resp:  [18-25] 20 (06/25 0704) BP: (91-142)/(63-86) 141/74 mmHg (06/25 0704) SpO2:  [90 %-99 %] 98 % (06/25 0704)  Intake/Output from previous day: 06/24 0701 - 06/25 0700 In: -  Out: 150 [Urine:150] Intake/Output this shift:    Labs:  Recent Labs  06/02/16 0325 06/02/16 0327  HGB 10.4* 9.9*    Recent Labs  06/02/16 0325 06/02/16 0327  WBC 9.4  --   RBC 3.04*  --   HCT 28.2* 29.0*  PLT 81*  --     Recent Labs  06/02/16 0327  NA 122*  K 3.4*  CL 81*  BUN 17  CREATININE 0.80  GLUCOSE 129*   No results for input(s): LABPT, INR in the last 72 hours.  Physical Exam: Neurologically intact ABD soft Sensation intact distally Dorsiflexion/Plantar flexion intact Compartment soft  Assessment/Plan:   Procedure(s) (LRB): LEFT ARTHROPLASTY BIPOLAR HIP (HEMIARTHROPLASTY) (Left) NPO after midnight Surgery for tomorrow - Pts daughter wants Dr. Jack Quarto, Darla Lesches for Dr. Melina Schools [facility] [phone] 06/02/2016, 7:43 AM

## 2016-06-02 NOTE — Consult Note (Signed)
Reason for Consult:Left hip fracture Referring Physician: EDP  HPI: Richard [name] is an 82 y.o. male well known to [place] with medical history significant of multiple myeloma followed by Dr. Benay Spice, prostate cancer, hyponatremia, HTN, HLD, and atrial fibrillation; who presents after having a fall at home yesterday evening while trying to use the restroom. Recent ED visit for confusion thought to be secondary to mild hyponatremia as well as underlying baseline cognitive status.  Past Medical History  Diagnosis Date  . Hypertension   . Dyslipidemia   . Arrhythmia     atrial fibrillation  . Cholelithiasis   . GERD (gastroesophageal reflux disease)   . Hiatal hernia   . Esophageal stricture   . History of BPH   . History of radiation therapy 01/29/11 -03/15/11    larynx 6600 cGy 33 sessions  . Arthritis   . Anemia     pt's daughter denies  . Multiple myeloma   . 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 Prostate cancer ([place]) 01/23/11    Gleason 3+3=6  . Facial basal cell cancer     nose  . Cataract     bil removed  . Pneumonia     Past Surgical History  Procedure Laterality Date  . Hip surgery    . Back surgery    . Trigeminal neuralgia    . Appendectomy    . Tonsillectomy    . Right chest wall resection      of second ,third,and fourth rib  . Esophageal dilation    . Colonoscopy    . Upper gastrointestinal endoscopy      Family History  Problem Relation Age of Onset  . Heart attack Father     age 70  . Multiple myeloma Mother   . Colon cancer Neg Hx   . Esophageal cancer Neg Hx   . Pancreatic cancer Neg Hx   . Prostate cancer Neg Hx   . Rectal cancer Neg Hx   . Stomach cancer Neg Hx     Social History:  reports that he quit smoking about 22 years ago. His smoking use included Cigarettes. He has quit using smokeless tobacco. He reports that he does not drink alcohol or use illicit drugs.  Allergies:  Allergies  Allergen Reactions   . Celebrex [Celecoxib] Other (See Comments)    GI bleeding  . Coricidin D Cold-Flu-Sinus  [Chlorphen-Pe-Acetaminophen] Hives    Medications: I have reviewed the patient's current medications.  Results for orders placed or performed during the hospital encounter of 06/02/16 (from the past 48 hour(s))  CBC with Differential/Platelet     Status: Abnormal   Collection Time: 06/02/16  3:25 AM  Result Value Ref Range   WBC 9.4 4.0 - 10.5 K/uL   RBC 3.04 (L) 4.22 - 5.81 MIL/uL   Hemoglobin 10.4 (L) 13.0 - 17.0 g/dL   HCT 28.2 (L) 39.0 - 52.0 %   MCV 92.8 78.0 - 100.0 fL   MCH 34.2 (H) 26.0 - 34.0 pg   MCHC 36.9 (H) 30.0 - 36.0 g/dL   RDW 14.5 11.5 - 15.5 %   Platelets 81 (L) 150 - 400 K/uL    Comment: RESULT REPEATED AND VERIFIED SPECIMEN CHECKED FOR CLOTS PLATELET COUNT CONFIRMED BY SMEAR    Neutrophils Relative % 88 %   Lymphocytes Relative 6 %   Monocytes Relative 6 %   Eosinophils Relative 0 %   Basophils Relative 0 %  Neutro Abs 8.2 (H) 1.7 - 7.7 K/uL   Lymphs Abs 0.6 (L) 0.7 - 4.0 K/uL   Monocytes Absolute 0.6 0.1 - 1.0 K/uL   Eosinophils Absolute 0.0 0.0 - 0.7 K/uL   Basophils Absolute 0.0 0.0 - 0.1 K/uL   Smear Review MORPHOLOGY UNREMARKABLE   I-stat chem 8, ed     Status: Abnormal   Collection Time: 06/02/16  3:27 AM  Result Value Ref Range   Sodium 122 (L) 135 - 145 mmol/L   Potassium 3.4 (L) 3.5 - 5.1 mmol/L   Chloride 81 (L) 101 - 111 mmol/L   BUN 17 6 - 20 mg/dL   Creatinine, Ser 0.80 0.61 - 1.24 mg/dL   Glucose, Bld 129 (H) 65 - 99 mg/dL   Calcium, Ion 1.32 (H) 1.13 - 1.30 mmol/L   TCO2 29 0 - 100 mmol/L   Hemoglobin 9.9 (L) 13.0 - 17.0 g/dL   HCT 29.0 (L) 39.0 - 52.0 %  Urinalysis, Routine w reflex microscopic (not at [facility])     Status: Abnormal   Collection Time: 06/02/16  4:23 AM  Result Value Ref Range   Color, Urine YELLOW YELLOW   APPearance CLOUDY (A) CLEAR   Specific Gravity, Urine 1.015 1.005 - 1.030   pH 7.0 5.0 - 8.0   Glucose, UA NEGATIVE  NEGATIVE mg/dL   Hgb urine dipstick SMALL (A) NEGATIVE   Bilirubin Urine NEGATIVE NEGATIVE   Ketones, ur NEGATIVE NEGATIVE mg/dL   Protein, ur NEGATIVE NEGATIVE mg/dL   Nitrite NEGATIVE NEGATIVE   Leukocytes, UA NEGATIVE NEGATIVE  Urine microscopic-add on     Status: None   Collection Time: 06/02/16  4:23 AM  Result Value Ref Range   Squamous Epithelial / LPF NONE SEEN NONE SEEN   WBC, UA 0-5 0 - 5 WBC/hpf   RBC / HPF NONE SEEN 0 - 5 RBC/hpf   Bacteria, UA NONE SEEN NONE SEEN   Urine-Other AMORPHOUS URATES/PHOSPHATES   Type and screen Once patient arrives to [facility]     Status: None   Collection Time: 06/02/16  5:25 AM  Result Value Ref Range   ABO/RH(D) O NEG    Antibody Screen NEG    Sample Expiration 06/05/2016     Dg Chest 2 View  06/02/2016  CLINICAL DATA:  Year-old male with fall and left hip fracture per EXAM: CHEST  2 VIEW COMPARISON:  Radiograph dated 05/16/2016 and chest CT dated 11/28/2014 FINDINGS: Two views of the chest do not demonstrate a focal consolidation. There is no pleural effusion or pneumothorax. Postsurgical changes of resection of right upper anterior pleural/chest wall mass seen on the prior CT. The cardiac silhouette is within normal limits. There is degenerative changes of the spine. No acute fracture IMPRESSION: No acute cardiopulmonary process. Electronically Signed   By: Anner Crete M.D.   On: 06/02/2016 04:03   Dg Hip Unilat With Pelvis 2-3 Views Left  06/02/2016  CLINICAL DATA:  82 year old male with fall and left hip pain. EXAM: DG HIP (WITH OR WITHOUT PELVIS) 2-3V LEFT COMPARISON:  None. FINDINGS: There is a transverse fracture of the left femoral neck with mild proximal migration of the femoral shaft. The head of the left femur remains in anatomic alignment with the acetabulum. No other acute fracture identified. The bones are osteopenic. Right hip arthroplasty. IMPRESSION: Transverse fracture of the left femoral neck. Electronically  Signed   By: Anner Crete M.D.   On: 06/02/2016 04:01     Vitals  Temp:  [98.3 F (36.8 C)-100.4 F (38 C)] 100.4 F (38 C) (06/25 0704) Pulse Rate:  [82-106] 98 (06/25 0704) Resp:  [18-25] 20 (06/25 0704) BP: (91-142)/(63-86) 141/74 mmHg (06/25 0704) SpO2:  [90 %-99 %] 98 % (06/25 0704) There is no weight on file to calculate BMI.  Physical Exam: Thin elderly WM resting comfortably. Orthopedic exam significant for diffuse tenderness about left hip with pain on attempted motion. N/V intact distally in both LE's, right knee with mild effusion (chronic per patient), no gross instability. UE's with no gross bone or joint instability     Assessment/Plan: Impression: displaced left femoral neck fracture, underlying chronic medical conditions as noted above and outlined in comprehensive review in H and P by Dr. Tamala Julian, [facility]. Treatment: I have spoken with the patient and his daughter regarding treatment options. They are in agreement with plan for hip arthroplasty, and will defer to the [place] joint reconstructive surgeons as to specific surgeon and timing based on availability. NPO after midnight.  Azzam Mehra M Demarqus Jocson 06/02/2016, 7:15 AM  Contact # [phone]

## 2016-06-02 NOTE — Progress Notes (Signed)
ED called re: left hip fracture.  Will need hemi.  Plan for surgery later today if ok with TRH.  Platelet count is 81, will defer to anesthesia if platelets are needed.    Full consult to follow.  Npo for now.   Johnny Bridge, MD

## 2016-06-02 NOTE — Progress Notes (Signed)
*  PRELIMINARY RESULTS* Echocardiogram 2D Echocardiogram has been performed.  Leavy Cella 06/02/2016, 2:18 PM

## 2016-06-02 NOTE — ED Notes (Signed)
Pt transported from home via EMS, pt states he lost his balance and fell. Per EMS shortening noted on L leg, pt is on oral chemo, does have a hx of confusion with meds. EMS noted several empty water bottles in home. IV est, Fentanyl 136mcg given.

## 2016-06-02 NOTE — ED Notes (Signed)
Pt repeating questions, giving different answers to same question. Pt unsure where family is attending wedding and not exactly sure when he last took his medication. Pt looking for a green card that a someone was looking at in his home was placed. Pt arrived only wearing boxer shorts. Pt did not have wallet upon arrival. Full assist with urinal.

## 2016-06-02 NOTE — ED Notes (Signed)
Patient transported to X-ray 

## 2016-06-02 NOTE — ED Provider Notes (Signed)
CSN: [id number]     Arrival date & time 06/02/16  0247 History  By signing my name below, I, Richard [name], attest that this documentation has been prepared under the direction and in the presence of Shanon Rosser, MD. Electronically Signed: Georgette [name], ED Scribe. 06/02/2016. 3:02 AM.   Chief Complaint  Patient presents with  . Fall   The history is provided by the patient and the EMS personnel. No language interpreter was used.    HPI Comments: Level 5 Caveat: confusion. Richard [name] is a 82 y.o. male with a history of multiple myeloma who presents to the Emergency Department complaining of hip pain after a fall at home. Pt was attempting to ambulate to the bathroom. The pain is severe at its worst and is exacerbated by movement of the left hip. He is not able to bear weight on that hip. EMS notes shortening and external rotation of the left lower extremity.   He denies other injuries or LOC, although the veracity of this is limited due to confusion. He states he feels generally weak. He also reports increased fluid intake and increased urinary output. He has history of hyponatremia with associated confusion. Patient was given 100 mcg of Fentanyl PTA with partial relief of pain.   Patient was noted to be hypoxic on arrival and was placed on oxygen on [place] with improvement.   Past Medical History  Diagnosis Date  . Hypertension   . Dyslipidemia   . Arrhythmia     atrial fibrillation  . Cholelithiasis   . GERD (gastroesophageal reflux disease)   . Hiatal hernia   . Esophageal stricture   . History of BPH   . History of radiation therapy 01/29/11 -03/15/11    larynx 6600 cGy 33 sessions  . Arthritis   . Anemia     pt's daughter denies  . Multiple myeloma   . 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 Prostate cancer ([place]) 01/23/11    Gleason 3+3=6  . Facial basal cell cancer     nose  . Cataract     bil removed  . Pneumonia    Past Surgical History  Procedure Laterality Date   . Hip surgery    . Back surgery    . Trigeminal neuralgia    . Appendectomy    . Tonsillectomy    . Right chest wall resection      of second ,third,and fourth rib  . Esophageal dilation    . Colonoscopy    . Upper gastrointestinal endoscopy     Family History  Problem Relation Age of Onset  . Heart attack Father     age 55  . Multiple myeloma Mother   . Colon cancer Neg Hx   . Esophageal cancer Neg Hx   . Pancreatic cancer Neg Hx   . Prostate cancer Neg Hx   . Rectal cancer Neg Hx   . Stomach cancer Neg Hx    Social History  Substance Use Topics  . Smoking status: Former Smoker    Types: Cigarettes    Quit date: 12/09/1993  . Smokeless tobacco: Former Systems developer  . Alcohol Use: No    Review of Systems  Unable to perform ROS: Mental status change    Allergies  Celebrex and Coricidin d cold-flu-sinus   Home Medications   Prior to Admission medications   Medication Sig Start Date End Date Taking? Authorizing Provider  aspirin 81 MG tablet Take 81  mg by mouth daily.     Yes Historical Provider, MD  bacitracin-polymyxin b (POLYSPORIN) ophthalmic ointment Place 1 application into both eyes 4 (four) times daily. 05/27/16  Yes Susanne Borders, NP  Cholecalciferol 5000 UNITS TABS Take 5,000 Units by mouth daily.   Yes Historical Provider, MD  losartan-hydrochlorothiazide Konrad Penta) 50-12.5 MG tablet  04/03/16  Yes Historical Provider, MD  Multiple Vitamin (MULTIVITAMIN) capsule Take 1 capsule by mouth daily.     Yes Historical Provider, MD  ondansetron (ZOFRAN) 8 MG tablet Take 1 tablet (8 mg total) by mouth every 8 (eight) hours as needed for nausea or vomiting. 11/21/15  Yes Ladell Pier, MD  pantoprazole (PROTONIX) 40 MG tablet TAKE ONE TABLET BY MOUTH ONCE DAILY 03/13/16  Yes Ladell Pier, MD  pomalidomide (POMALYST) 3 MG capsule Take 1 capsule (3 mg total) by mouth daily. Take with water on days 1-21. Repeat every 28 days. 05/27/16  Yes Ladell Pier, MD  rOPINIRole  (REQUIP) 0.25 MG tablet TAKE ONE TO TWO TABLETS BY MOUTH ONCE DAILY AT BEDTIME AS NEEDED FOR  RESTLESS  LEGS Patient taking differently: TAKE ONE TABLET BY MOUTH ONCE DAILY AT BEDTIME FOR  RESTLESS  LEGS 05/10/16  Yes Owens Shark, NP  saw palmetto 160 MG capsule Take 320 mg by mouth daily.    Yes Historical Provider, MD  Tamsulosin HCl (FLOMAX) 0.4 MG CAPS Take 0.4 mg by mouth daily.    Yes Historical Provider, MD  vitamin B-12 (CYANOCOBALAMIN) 100 MCG tablet Take 100 mcg by mouth daily.     Yes Historical Provider, MD   BP 141/74 mmHg  Pulse 98  Temp(Src) 100.4 F (38 C) (Oral)  Resp 20  SpO2 98%   Physical Exam General: Well-developed, well-nourished male in no acute distress; appearance consistent with age of record HENT: normocephalic; atraumatic Eyes: pupils pinpoint; extraocular muscles intact Neck: supple Heart: regular rate and rhythm; tachycardia Lungs: clear to auscultation bilaterally Abdomen: soft; nondistended; nontender; bowel sounds present Extremities: Shortening and external rotation of left lower extremity with pain on attempted ROM of left hip; pulses normal Neurologic: Awake, alert; confusion with odd answers to questions; motor function intact in all extremities and symmetric; no facial droop Skin: Warm and dry Psychiatric: Flat affect   ED Course  Procedures (including critical care time)   MDM   Nursing notes and vitals signs, including pulse oximetry, reviewed.  Summary of this visit's results, reviewed by myself:  Labs:  Results for orders placed or performed during the hospital encounter of 06/02/16 (from the past 24 hour(s))  CBC with Differential/Platelet     Status: Abnormal   Collection Time: 06/02/16  3:25 AM  Result Value Ref Range   WBC 9.4 4.0 - 10.5 K/uL   RBC 3.04 (L) 4.22 - 5.81 MIL/uL   Hemoglobin 10.4 (L) 13.0 - 17.0 g/dL   HCT 28.2 (L) 39.0 - 52.0 %   MCV 92.8 78.0 - 100.0 fL   MCH 34.2 (H) 26.0 - 34.0 pg   MCHC 36.9 (H) 30.0 -  36.0 g/dL   RDW 14.5 11.5 - 15.5 %   Platelets 81 (L) 150 - 400 K/uL   Neutrophils Relative % 88 %   Lymphocytes Relative 6 %   Monocytes Relative 6 %   Eosinophils Relative 0 %   Basophils Relative 0 %   Neutro Abs 8.2 (H) 1.7 - 7.7 K/uL   Lymphs Abs 0.6 (L) 0.7 - 4.0 K/uL   Monocytes Absolute  0.6 0.1 - 1.0 K/uL   Eosinophils Absolute 0.0 0.0 - 0.7 K/uL   Basophils Absolute 0.0 0.0 - 0.1 K/uL   Smear Review MORPHOLOGY UNREMARKABLE   I-stat chem 8, ed     Status: Abnormal   Collection Time: 06/02/16  3:27 AM  Result Value Ref Range   Sodium 122 (L) 135 - 145 mmol/L   Potassium 3.4 (L) 3.5 - 5.1 mmol/L   Chloride 81 (L) 101 - 111 mmol/L   BUN 17 6 - 20 mg/dL   Creatinine, Ser 0.80 0.61 - 1.24 mg/dL   Glucose, Bld 129 (H) 65 - 99 mg/dL   Calcium, Ion 1.32 (H) 1.13 - 1.30 mmol/L   TCO2 29 0 - 100 mmol/L   Hemoglobin 9.9 (L) 13.0 - 17.0 g/dL   HCT 29.0 (L) 39.0 - 52.0 %  Urinalysis, Routine w reflex microscopic (not at [facility])     Status: Abnormal   Collection Time: 06/02/16  4:23 AM  Result Value Ref Range   Color, Urine YELLOW YELLOW   APPearance CLOUDY (A) CLEAR   Specific Gravity, Urine 1.015 1.005 - 1.030   pH 7.0 5.0 - 8.0   Glucose, UA NEGATIVE NEGATIVE mg/dL   Hgb urine dipstick SMALL (A) NEGATIVE   Bilirubin Urine NEGATIVE NEGATIVE   Ketones, ur NEGATIVE NEGATIVE mg/dL   Protein, ur NEGATIVE NEGATIVE mg/dL   Nitrite NEGATIVE NEGATIVE   Leukocytes, UA NEGATIVE NEGATIVE  Urine microscopic-add on     Status: None   Collection Time: 06/02/16  4:23 AM  Result Value Ref Range   Squamous Epithelial / LPF NONE SEEN NONE SEEN   WBC, UA 0-5 0 - 5 WBC/hpf   RBC / HPF NONE SEEN 0 - 5 RBC/hpf   Bacteria, UA NONE SEEN NONE SEEN   Urine-Other AMORPHOUS URATES/PHOSPHATES   Type and screen Once patient arrives to [facility]     Status: None   Collection Time: 06/02/16  5:25 AM  Result Value Ref Range   ABO/RH(D) O NEG    Antibody Screen NEG    Sample Expiration  06/05/2016     Imaging Studies: Dg Chest 2 View  06/02/2016  CLINICAL DATA:  Year-old male with fall and left hip fracture per EXAM: CHEST  2 VIEW COMPARISON:  Radiograph dated 05/16/2016 and chest CT dated 11/28/2014 FINDINGS: Two views of the chest do not demonstrate a focal consolidation. There is no pleural effusion or pneumothorax. Postsurgical changes of resection of right upper anterior pleural/chest wall mass seen on the prior CT. The cardiac silhouette is within normal limits. There is degenerative changes of the spine. No acute fracture IMPRESSION: No acute cardiopulmonary process. Electronically Signed   By: Anner Crete M.D.   On: 06/02/2016 04:03   Dg Hip Unilat With Pelvis 2-3 Views Left  06/02/2016  CLINICAL DATA:  82 year old male with fall and left hip pain. EXAM: DG HIP (WITH OR WITHOUT PELVIS) 2-3V LEFT COMPARISON:  None. FINDINGS: There is a transverse fracture of the left femoral neck with mild proximal migration of the femoral shaft. The head of the left femur remains in anatomic alignment with the acetabulum. No other acute fracture identified. The bones are osteopenic. Right hip arthroplasty. IMPRESSION: Transverse fracture of the left femoral neck. Electronically Signed   By: Anner Crete M.D.   On: 06/02/2016 04:01     Final diagnoses:  Fall at home, initial encounter  Displaced fracture of left femoral neck, closed, initial encounter ([place])  Hyponatremia  I personally performed the services described in this documentation, which was scribed in my presence. The recorded information has been reviewed and is accurate.     Shanon Rosser, MD 06/02/16 [phone]

## 2016-06-02 NOTE — ED Notes (Signed)
Bed: [id number] Expected date:  Expected time:  Means of arrival:  Comments: 82 yo M  Fall, hip pain

## 2016-06-03 ENCOUNTER — Telehealth: Payer: Self-pay

## 2016-06-03 ENCOUNTER — Encounter (HOSPITAL_COMMUNITY)
Admission: EM | Disposition: A | Discharge status: Skilled Nursing Facility | Payer: Self-pay | Source: Home / Self Care | Attending: Internal Medicine

## 2016-06-03 ENCOUNTER — Inpatient Hospital Stay (HOSPITAL_COMMUNITY): Payer: Medicare Other | Admitting: Certified Registered Nurse Anesthetist

## 2016-06-03 ENCOUNTER — Inpatient Hospital Stay (HOSPITAL_COMMUNITY): Payer: Medicare Other

## 2016-06-03 DIAGNOSIS — D630 Anemia in neoplastic disease: Secondary | ICD-10-CM

## 2016-06-03 DIAGNOSIS — D6959 Other secondary thrombocytopenia: Secondary | ICD-10-CM

## 2016-06-03 DIAGNOSIS — M84459A Pathological fracture, hip, unspecified, initial encounter for fracture: Secondary | ICD-10-CM

## 2016-06-03 DIAGNOSIS — R970 Elevated carcinoembryonic antigen [CEA]: Secondary | ICD-10-CM

## 2016-06-03 DIAGNOSIS — R509 Fever, unspecified: Secondary | ICD-10-CM

## 2016-06-03 DIAGNOSIS — E871 Hypo-osmolality and hyponatremia: Secondary | ICD-10-CM | POA: Diagnosis present

## 2016-06-03 DIAGNOSIS — C9002 Multiple myeloma in relapse: Secondary | ICD-10-CM

## 2016-06-03 DIAGNOSIS — D696 Thrombocytopenia, unspecified: Secondary | ICD-10-CM

## 2016-06-03 DIAGNOSIS — Z01818 Encounter for other preprocedural examination: Secondary | ICD-10-CM

## 2016-06-03 HISTORY — PX: HIP ARTHROPLASTY: SHX981

## 2016-06-03 LAB — BASIC METABOLIC PANEL
ANION GAP: 6 (ref 5–15)
BUN: 17 mg/dL (ref 6–20)
CALCIUM: 9.5 mg/dL (ref 8.9–10.3)
CO2: 30 mmol/L (ref 22–32)
Chloride: 95 mmol/L — ABNORMAL LOW (ref 101–111)
Creatinine, Ser: 0.88 mg/dL (ref 0.61–1.24)
GFR calc Af Amer: >60 mL/min (ref >60)
GLUCOSE: 111 mg/dL — AB (ref 65–99)
Potassium: 3.7 mmol/L (ref 3.5–5.1)
Sodium: 131 mmol/L — ABNORMAL LOW (ref 135–145)

## 2016-06-03 LAB — CBC
HCT: 29.4 % — ABNORMAL LOW (ref 39.0–52.0)
Hemoglobin: 10.3 g/dL — ABNORMAL LOW (ref 13.0–17.0)
MCH: 34 pg (ref 26.0–34.0)
MCHC: 35 g/dL (ref 30.0–36.0)
MCV: 97 fL (ref 78.0–100.0)
PLATELETS: 86 10*3/uL — AB (ref 150–400)
RBC: 3.03 MIL/uL — ABNORMAL LOW (ref 4.22–5.81)
RDW: 15.1 % (ref 11.5–15.5)
WBC: 5.6 10*3/uL (ref 4.0–10.5)

## 2016-06-03 LAB — URINALYSIS, ROUTINE W REFLEX MICROSCOPIC
BILIRUBIN URINE: NEGATIVE
Glucose, UA: NEGATIVE mg/dL
Hgb urine dipstick: NEGATIVE
KETONES UR: NEGATIVE mg/dL
LEUKOCYTES UA: NEGATIVE
NITRITE: NEGATIVE
PH: 6.5 (ref 5.0–8.0)
PROTEIN: NEGATIVE mg/dL
Specific Gravity, Urine: 1.013 (ref 1.005–1.030)

## 2016-06-03 SURGERY — HEMIARTHROPLASTY, HIP, DIRECT ANTERIOR APPROACH, FOR FRACTURE
Anesthesia: General | Site: Hip | Laterality: Left

## 2016-06-03 MED ORDER — ACETAMINOPHEN 10 MG/ML IV SOLN
INTRAVENOUS | Status: AC
  Filled 2016-06-03: qty 100

## 2016-06-03 MED ORDER — ROCURONIUM BROMIDE 100 MG/10ML IV SOLN
INTRAVENOUS | Status: DC | PRN
  Administered 2016-06-03: 50 mg via INTRAVENOUS

## 2016-06-03 MED ORDER — PROMETHAZINE HCL 25 MG/ML IJ SOLN: 6.2500 mg | INTRAMUSCULAR | Status: DC | PRN

## 2016-06-03 MED ORDER — LIDOCAINE HCL (CARDIAC) 20 MG/ML IV SOLN
INTRAVENOUS | Status: AC
  Filled 2016-06-03: qty 5

## 2016-06-03 MED ORDER — SUGAMMADEX SODIUM 200 MG/2ML IV SOLN
INTRAVENOUS | Status: DC | PRN
  Administered 2016-06-03: 200 mg via INTRAVENOUS

## 2016-06-03 MED ORDER — DEXAMETHASONE SODIUM PHOSPHATE 10 MG/ML IJ SOLN
INTRAMUSCULAR | Status: AC
  Filled 2016-06-03: qty 1

## 2016-06-03 MED ORDER — ROCURONIUM BROMIDE 100 MG/10ML IV SOLN
INTRAVENOUS | Status: AC
  Filled 2016-06-03: qty 1

## 2016-06-03 MED ORDER — SUGAMMADEX SODIUM 200 MG/2ML IV SOLN
INTRAVENOUS | Status: AC
  Filled 2016-06-03: qty 2

## 2016-06-03 MED ORDER — ONDANSETRON HCL 4 MG/2ML IJ SOLN
INTRAMUSCULAR | Status: AC
  Filled 2016-06-03: qty 2

## 2016-06-03 MED ORDER — CEFAZOLIN SODIUM-DEXTROSE 2-3 GM-% IV SOLR
INTRAVENOUS | Status: DC | PRN
  Administered 2016-06-03: 2 g via INTRAVENOUS

## 2016-06-03 MED ORDER — FENTANYL CITRATE (PF) 100 MCG/2ML IJ SOLN
INTRAMUSCULAR | Status: DC | PRN
  Administered 2016-06-03: 50 ug via INTRAVENOUS

## 2016-06-03 MED ORDER — LIDOCAINE HCL (CARDIAC) 20 MG/ML IV SOLN
INTRAVENOUS | Status: DC | PRN
  Administered 2016-06-03: 50 mg via INTRAVENOUS

## 2016-06-03 MED ORDER — 0.9 % SODIUM CHLORIDE (POUR BTL) OPTIME
TOPICAL | Status: DC | PRN
  Administered 2016-06-03: 1000 mL

## 2016-06-03 MED ORDER — METHOCARBAMOL 1000 MG/10ML IJ SOLN
500.0000 mg | Freq: Four times a day (QID) | INTRAVENOUS | Status: DC | PRN
  Filled 2016-06-03: qty 5

## 2016-06-03 MED ORDER — TRANEXAMIC ACID 1000 MG/10ML IV SOLN
1000.0000 mg | INTRAVENOUS | Status: AC
  Administered 2016-06-03: 1000 mg via INTRAVENOUS
  Filled 2016-06-03: qty 10

## 2016-06-03 MED ORDER — PROPOFOL 10 MG/ML IV BOLUS
INTRAVENOUS | Status: DC | PRN
  Administered 2016-06-03: 100 mg via INTRAVENOUS

## 2016-06-03 MED ORDER — ALBUMIN HUMAN 5 % IV SOLN
INTRAVENOUS | Status: DC | PRN
  Administered 2016-06-03: 23:00:00 via INTRAVENOUS

## 2016-06-03 MED ORDER — PHENYLEPHRINE 40 MCG/ML (10ML) SYRINGE FOR IV PUSH (FOR BLOOD PRESSURE SUPPORT)
PREFILLED_SYRINGE | INTRAVENOUS | Status: AC
  Filled 2016-06-03: qty 10

## 2016-06-03 MED ORDER — FENTANYL CITRATE (PF) 250 MCG/5ML IJ SOLN
INTRAMUSCULAR | Status: AC
Start: 2016-06-03 — End: 2016-06-03
  Filled 2016-06-03: qty 5

## 2016-06-03 MED ORDER — LACTATED RINGERS IV SOLN
INTRAVENOUS | Status: DC | PRN
  Administered 2016-06-03 (×2): via INTRAVENOUS

## 2016-06-03 MED ORDER — METHOCARBAMOL 500 MG PO TABS
500.0000 mg | ORAL_TABLET | Freq: Four times a day (QID) | ORAL | Status: DC | PRN
  Administered 2016-06-04: 500 mg via ORAL
  Filled 2016-06-03: qty 1

## 2016-06-03 MED ORDER — PHENYLEPHRINE HCL 10 MG/ML IJ SOLN
INTRAMUSCULAR | Status: DC | PRN
  Administered 2016-06-03 (×2): 80 ug via INTRAVENOUS

## 2016-06-03 MED ORDER — TRANEXAMIC ACID 1000 MG/10ML IV SOLN
1000.0000 mg | INTRAVENOUS | Status: DC
  Filled 2016-06-03: qty 10

## 2016-06-03 MED ORDER — ONDANSETRON HCL 4 MG/2ML IJ SOLN
INTRAMUSCULAR | Status: DC | PRN
  Administered 2016-06-03: 4 mg via INTRAVENOUS

## 2016-06-03 MED ORDER — ALBUMIN HUMAN 5 % IV SOLN
INTRAVENOUS | Status: AC
  Filled 2016-06-03: qty 250

## 2016-06-03 MED ORDER — HYDROMORPHONE HCL 1 MG/ML IJ SOLN: 0.2500 mg | INTRAMUSCULAR | Status: DC | PRN

## 2016-06-03 MED ORDER — DEXAMETHASONE SODIUM PHOSPHATE 10 MG/ML IJ SOLN
INTRAMUSCULAR | Status: DC | PRN
  Administered 2016-06-03: 10 mg via INTRAVENOUS

## 2016-06-03 SURGICAL SUPPLY — 51 items
BAG ZIPLOCK 12X15 (MISCELLANEOUS) ×3 IMPLANT
BLADE SAW SGTL 18X1.27X75 (BLADE) ×2 IMPLANT
BLADE SAW SGTL 18X1.27X75MM (BLADE) ×1
CAPT HIP HEMI 2 ×3 IMPLANT
CLOSURE WOUND 1/2 X4 (GAUZE/BANDAGES/DRESSINGS)
DRAPE INCISE IOBAN 85X60 (DRAPES) ×3 IMPLANT
DRAPE ORTHO SPLIT 77X108 STRL (DRAPES) ×4
DRAPE POUCH INSTRU U-SHP 10X18 (DRAPES) ×3 IMPLANT
DRAPE SURG 17X11 SM STRL (DRAPES) ×3 IMPLANT
DRAPE SURG ORHT 6 SPLT 77X108 (DRAPES) ×2 IMPLANT
DRAPE U-SHAPE 47X51 STRL (DRAPES) ×3 IMPLANT
DRSG AQUACEL AG ADV 3.5X10 (GAUZE/BANDAGES/DRESSINGS) ×3 IMPLANT
DRSG TEGADERM 4X4.75 (GAUZE/BANDAGES/DRESSINGS) IMPLANT
DURAPREP 26ML APPLICATOR (WOUND CARE) ×3 IMPLANT
ELECT BLADE TIP CTD 4 INCH (ELECTRODE) ×3 IMPLANT
ELECT REM PT RETURN 9FT ADLT (ELECTROSURGICAL) ×3
ELECTRODE REM PT RTRN 9FT ADLT (ELECTROSURGICAL) ×1 IMPLANT
EVACUATOR 1/8 PVC DRAIN (DRAIN) IMPLANT
FACESHIELD WRAPAROUND (MASK) ×9 IMPLANT
GAUZE SPONGE 2X2 8PLY STRL LF (GAUZE/BANDAGES/DRESSINGS) IMPLANT
GLOVE BIOGEL M 7.0 STRL (GLOVE) IMPLANT
GLOVE BIOGEL PI IND STRL 7.5 (GLOVE) ×2 IMPLANT
GLOVE BIOGEL PI IND STRL 8.5 (GLOVE) ×1 IMPLANT
GLOVE BIOGEL PI INDICATOR 7.5 (GLOVE) ×4
GLOVE BIOGEL PI INDICATOR 8.5 (GLOVE) ×2
GLOVE ECLIPSE 8.0 STRL XLNG CF (GLOVE) ×3 IMPLANT
GLOVE ORTHO TXT STRL SZ7.5 (GLOVE) ×6 IMPLANT
GLOVE SURG ORTHO 8.0 STRL STRW (GLOVE) ×3 IMPLANT
GOWN STRL REUS W/TWL LRG LVL3 (GOWN DISPOSABLE) ×3 IMPLANT
GOWN STRL REUS W/TWL XL LVL3 (GOWN DISPOSABLE) ×6 IMPLANT
HANDPIECE INTERPULSE COAX TIP (DISPOSABLE) ×2
IMMOBILIZER KNEE 20 (SOFTGOODS) ×3
IMMOBILIZER KNEE 20 THIGH 36 (SOFTGOODS) ×1 IMPLANT
KIT BASIN OR (CUSTOM PROCEDURE TRAY) ×3 IMPLANT
LIQUID BAND (GAUZE/BANDAGES/DRESSINGS) ×3 IMPLANT
MANIFOLD NEPTUNE II (INSTRUMENTS) ×3 IMPLANT
PACK TOTAL JOINT (CUSTOM PROCEDURE TRAY) ×3 IMPLANT
POSITIONER SURGICAL ARM (MISCELLANEOUS) ×6 IMPLANT
SET HNDPC FAN SPRY TIP SCT (DISPOSABLE) ×1 IMPLANT
SPONGE GAUZE 2X2 STER 10/PKG (GAUZE/BANDAGES/DRESSINGS)
STRIP CLOSURE SKIN 1/2X4 (GAUZE/BANDAGES/DRESSINGS) IMPLANT
SUT ETHIBOND NAB CT1 #1 30IN (SUTURE) ×3 IMPLANT
SUT MNCRL AB 4-0 PS2 18 (SUTURE) ×3 IMPLANT
SUT VIC AB 1 CT1 36 (SUTURE) ×6 IMPLANT
SUT VIC AB 2-0 CT1 27 (SUTURE) ×4
SUT VIC AB 2-0 CT1 TAPERPNT 27 (SUTURE) ×2 IMPLANT
SUT VLOC 180 0 24IN GS25 (SUTURE) ×3 IMPLANT
TOWEL OR 17X26 10 PK STRL BLUE (TOWEL DISPOSABLE) ×6 IMPLANT
TRAY FOLEY W/METER SILVER 14FR (SET/KITS/TRAYS/PACK) IMPLANT
TRAY FOLEY W/METER SILVER 16FR (SET/KITS/TRAYS/PACK) IMPLANT
YANKAUER SUCT BULB TIP NO VENT (SUCTIONS) ×3 IMPLANT

## 2016-06-03 NOTE — Transfer of Care (Signed)
Immediate Anesthesia Transfer of Care Note  Patient: Richard [name]  Procedure(s) Performed: Procedure(s): ARTHROPLASTY BIPOLAR HIP LEFT  (HEMIARTHROPLASTY) (Left)  Patient Location: PACU  Anesthesia Type:General  Level of Consciousness:  sedated, patient cooperative and responds to stimulation  Airway & Oxygen Therapy:Patient Spontanous Breathing and Patient connected to face mask oxgen  Post-op Assessment:  Report given to PACU RN and Post -op Vital signs reviewed and stable  Post vital signs:  Reviewed and stable  Last Vitals:  Filed Vitals:   06/03/16 1332 06/03/16 2054  BP: 128/60 134/67  Pulse: 90 83  Temp: 37.2 C 36.4 C  Resp: 20 16    Complications: No apparent anesthesia complications

## 2016-06-03 NOTE — Op Note (Signed)
NAME:  Richard [name], Richard [name]                ACCOUNT NO.:  [id number]   MEDICAL RECORD NO.: [id number]   LOCATION:  [id number]                         FACILITY:  [facility] OF BIRTH:  [birth year]  PHYSICIAN:  Pietro Cassis. Alvan Dame, M.D.     DATE OF PROCEDURE:  06/03/16                               OPERATIVE REPORT     PREOPERATIVE DIAGNOSIS:  Left displaced femoral neck fracture.   POSTOPERATIVE DIAGNOSIS:  Left displaced femoral neck fracture.   PROCEDURE:  Left hip hemiarthroplasty utilizing DePuy component, size 5 high offset Tri-Lock stem with a 39mm unipolar ball with a +0 adapter.   SURGEON:  Pietro Cassis. Alvan Dame, MD   ASSISTANT:  Danae Orleans, PA-C.   ANESTHESIA:  General.   SPECIMENS:  None.   DRAINS:  None.   BLOOD LOSS:  About 350 cc.   COMPLICATIONS:  None.   INDICATION OF PROCEDURE:  Richard [name] is a pleasant 82 year old male who lives independently.  He unfortunately had a fall while at his house apparently trying to get up to use restroom.  He was admitted to the hospital after radiographs revealed a femoral neck fracture.  He was seen and evaluated and was scheduled for surgery for fixation.  The necessity of surgical repair was discussed with him and His daughter.  Consent was obtained after reviewing risks of infection, DVT, component failure, and need for revision surgery.   PROCEDURE IN DETAIL:  The patient was brought to the operative theater. Once adequate anesthesia, preoperative antibiotics, 2g of Ancef administered, the patient was positioned into the right lateral decubitus position with the left side up.  The left lower extremity was then prepped and draped in sterile fashion.  A time-out was performed identifying the patient, planned procedure, and extremity.   A lateral incision was made off the proximal trochanter. Sharp dissection was carried down to the iliotibial band and gluteal fascia. The gluteal fascia was then incised for posterior approach.  The  short external rotators were taken down separate from the posterior capsule. An L capsulotomy was made preserving the posterior leaflet for later anatomic repair. Fracture site was identified and after removing comminuted segments of the posterior femoral neck, the femoral head was removed without difficulty and measured on the back table  using the sizing rings and determined to be 50 mm in diameter.   The proximal femur was then exposed.  Retractors placed.  I then drilled, opened the proximal femur.  Then I hand reamed once and  Irrigated the canal to try to prevent fat emboli.  I began broaching the femur with a starting broach up to a size 5 broach with good medial and lateral metaphyseal fit without evidence of any torsion or movement.  A trial reduction was carried out with a high offset neck and a +0 adapter with a 12mm ball.  The hip reduced nicely.  The leg lengths appeared to be equal compared to the down leg.   The hip went through a range of motion without evidence of any subluxation or impingement.   Given these findings, the trial components removed.  The final 5 Hi  Tri-Lock stem was opened.  After irrigating the canal, the final stem was impacted and sat at the level where the broach was. Based on this and the trial reduction, a +0 adapter was opened and impacted in the 78mm unipolar ball onto a clean and dry trunnion.  The hip had been irrigated throughout the case and again at this point.  I re- Approximated the posterior capsule to the superior leaflet using a  #1 Vicryl.  The remainder of the wound was closed with #1 Vicryl and #0 V-lock sutures in the iliotibial band and gluteal fascia, a  2-0 Vicryl in the sub-Q tissue and a running 4-0 Monocryl in the skin.  The hip was cleaned, dried, and dressed sterilely using Dermabond and Aquacel dressing.  He was then brought to recovery room, extubated in stable condition, tolerating the procedure well.  Danae Orleans,  PA-C was present and utilized as Environmental consultant for the entire case from  Preoperative positioning to management of the contralateral extremity and retractors to  General facilitation of the procedure.  He was also involved with primary wound closure.         Pietro Cassis Alvan Dame, M.D.

## 2016-06-03 NOTE — Anesthesia Procedure Notes (Addendum)
Procedure Name: Intubation Date/Time: 06/03/2016 10:06 PM Performed by: West Pugh Pre-anesthesia Checklist: Patient identified, Emergency Drugs available, Suction available, Patient being monitored and Timeout performed Patient Re-evaluated:Patient Re-evaluated prior to inductionOxygen Delivery Method: Circle system utilized Preoxygenation: Pre-oxygenation with 100% oxygen Intubation Type: IV induction Ventilation: Mask ventilation without difficulty Laryngoscope Size: Mac and 4 Grade View: Grade I Tube type: Oral Tube size: 7.5 mm Number of attempts: 1 Airway Equipment and Method: Stylet Placement Confirmation: ETT inserted through vocal cords under direct vision,  positive ETCO2,  CO2 detector and breath sounds checked- equal and bilateral Secured at: 22 cm Tube secured with: Tape Dental Injury: Teeth and Oropharynx as per pre-operative assessment

## 2016-06-03 NOTE — Anesthesia Preprocedure Evaluation (Addendum)
Anesthesia Evaluation  Patient identified by MRN, date of birth, ID band Patient awake    Reviewed: Allergy & Precautions, NPO status , Patient's Chart, lab work & pertinent test results  History of Anesthesia Complications Negative for: history of anesthetic complications  Airway Mallampati: II  TM Distance: >3 FB Neck ROM: Full    Dental  (+) Caps, Dental Advisory Given   Pulmonary former smoker,    Pulmonary exam normal        Cardiovascular hypertension, Normal cardiovascular exam+ dysrhythmias Atrial Fibrillation      Neuro/Psych negative neurological ROS  negative psych ROS   GI/Hepatic Neg liver ROS, hiatal hernia, GERD  Medicated and Controlled,  Endo/Other    Renal/GU negative Renal ROS     Musculoskeletal   Abdominal   Peds  Hematology Multiple myeloma, chronic anemia and thrombocytopenia   Anesthesia Other Findings   Reproductive/Obstetrics                           Anesthesia Physical Anesthesia Plan  ASA: III  Anesthesia Plan: General   Post-op Pain Management:    Induction: Intravenous  Airway Management Planned: Oral ETT and LMA  Additional Equipment:   Intra-op Plan:   Post-operative Plan: Extubation in OR  Informed Consent: I have reviewed the patients History and Physical, chart, labs and discussed the procedure including the risks, benefits and alternatives for the proposed anesthesia with the patient or authorized representative who has indicated his/her understanding and acceptance.   Dental advisory given  Plan Discussed with: Anesthesiologist, CRNA and Surgeon  Anesthesia Plan Comments:       Anesthesia Quick Evaluation

## 2016-06-03 NOTE — Progress Notes (Signed)
Patient ID: Richard [name], male   DOB: 04/25/1933, 82 y.o.   MRN: [id number]  Comfortable.  No reported issues or concerns, daughter not in room as she had to go to work  Displaced left femoral neck fracture Needs left hip hemiarthroplasty NPO Consent to be signed once I speak with his daughter  Post op weight bearing to be determined based on intra-operative findings Disposition pending PT eval and needs at home

## 2016-06-03 NOTE — Progress Notes (Signed)
IP PROGRESS NOTE  Subjective:   Richard [name] is well-known to me with a history of multiple myeloma. He fell at home over the weekend while trying to go to the bathroom. He was diagnosed with a left hip fracture. Richard [name] has been more confused over recent weeks. The etiology of his confusion is unclear. We suspect he has a component of underlying dementia. He has also been diagnosed with progression of the myeloma. Treatment was switched to pomalidomide/Decadron last week. He has been maintained on every 3 month Zometa.  He denies pain this morning.   Objective: Vital signs in last 24 hours: Blood pressure 128/60, pulse 90, temperature 99 F (37.2 C), temperature source Oral, resp. rate 20, height 5' 8"  (1.727 m), weight 170 lb (77.111 kg), SpO2 97 %.  Intake/Output from previous day: 06/25 0701 - 06/26 0700 In: 1850 [P.O.:720; I.V.:1130] Out: 1675 [Urine:1675]  Physical Exam:  Lungs: Clear bilaterally  Cardiac: Regular rate and rhythm  Abdomen: No hepatosplenomegaly, nontender  Extremities: No leg edema  Lab Results:  Recent Labs  06/02/16 1953 06/03/16 0425  WBC 6.3 5.6  HGB 10.0* 10.3*  HCT 27.4* 29.4*  PLT 85* 86*    BMET  Recent Labs  06/02/16 1503 06/03/16 0425  NA 124* 131*  K 4.1 3.7  CL 89* 95*  CO2 28 30  GLUCOSE 120* 111*  BUN 16 17  CREATININE 0.94 0.88  CALCIUM 9.4 9.5    Studies/Results: Dg Chest 2 View  06/02/2016  CLINICAL DATA:  Year-old male with fall and left hip fracture per EXAM: CHEST  2 VIEW COMPARISON:  Radiograph dated 05/16/2016 and chest CT dated 11/28/2014 FINDINGS: Two views of the chest do not demonstrate a focal consolidation. There is no pleural effusion or pneumothorax. Postsurgical changes of resection of right upper anterior pleural/chest wall mass seen on the prior CT. The cardiac silhouette is within normal limits. There is degenerative changes of the spine. No acute fracture IMPRESSION: No acute cardiopulmonary  process. Electronically Signed   By: Anner Crete M.D.   On: 06/02/2016 04:03   Dg Chest Port 1 View  06/02/2016  CLINICAL DATA:  82 year old male with fever. Scheduled for hip surgery tomorrow. EXAM: PORTABLE CHEST 1 VIEW COMPARISON:  Radiograph dated 06/02/2016 FINDINGS: Single portable view of the chest do not demonstrate a focal consolidation. There is no pleural effusion or pneumothorax. A linear lucency along the lateral aspect of the right lung represent skin fold artifact. Postsurgical changes of the right upper lobe with multiple anterior rib resections and surgical clips. The cardiac silhouette is within normal limits. No acute osseous pathology. IMPRESSION: No active disease. Electronically Signed   By: Anner Crete M.D.   On: 06/02/2016 20:11   Dg Hip Unilat With Pelvis 2-3 Views Left  06/02/2016  CLINICAL DATA:  82 year old male with fall and left hip pain. EXAM: DG HIP (WITH OR WITHOUT PELVIS) 2-3V LEFT COMPARISON:  None. FINDINGS: There is a transverse fracture of the left femoral neck with mild proximal migration of the femoral shaft. The head of the left femur remains in anatomic alignment with the acetabulum. No other acute fracture identified. The bones are osteopenic. Right hip arthroplasty. IMPRESSION: Transverse fracture of the left femoral neck. Electronically Signed   By: Anner Crete M.D.   On: 06/02/2016 04:01    Medications: I have reviewed the patient's current medications.  Assessment/Plan:  1. Multiple myeloma diagnosed in 2010, currently being treated with salvage therapy consisting of pomalidomide/Decadron, pomalidomide  started 05/31/2016  2. Anemia/thrombocytopenia secondary to multiple myeloma  3.  Elevated PSA  4.  Esophageal stricture, status post dilation 04/26/2016  5.  Left femoral neck fracture 06/02/2016  Richard [name] is admitted with a left hip fracture. He has progressive multiple myeloma. The fracture may be pathologic.  I recommend  continuing the daily pomalidomide and weekly Decadron.  Recommendations: 1. Surgical management of the left femoral neck fracture per orthopedics 2. Continue pomalidomide, and weekly Decadron 3. Outpatient follow-up will be scheduled at the [facility]. 4. Please call Oncology as needed while he is in the hospital.   LOS: 1 day   Betsy Coder, MD   06/03/2016, 4:38 PM

## 2016-06-03 NOTE — Telephone Encounter (Signed)
Spoke with patients daughter and she confirms receipt of Pomalyst.  However, over the weekend Mr Raimer fell and broke his hip and is having hip surgery 06/03/16.  She did state that she has provided the hospital at [facility] his Pomalyst to take while he is admitted.  She states he has had no side effect issues.  I did remind her that the Pomalyst can cause some degree of drowsiness and to be alert to this.  She was upset that she had him drinking lots of water and that he was in a hurry to get to the rest room and he fell.  I advised her that when he attempts to stand to do so slowly as the drowsy effect and just a rapid stand up can sometimes cause the patient to become dizzy and have the potential to fall.  She was not aware this could occur and would ensure upon his discharge that he stands up a little slower before beginning his walk.  Informed the daughter our thoughts and prayers were with him during this time and we hope for a speedy recovery.  Advised her to contact us at anytime should she have any concerns or questions.  Thank you  Henreitta Leber, PharmD [facility]

## 2016-06-03 NOTE — Progress Notes (Signed)
[facility] PROGRESS NOTE  Richard [name] SUP:[id number] DOB: 1933-05-17 DOA: 06/02/2016  PCP: Rachell Cipro, MD  Brief HPI: 82 year old Caucasian male with past medical history of multiple myeloma, followed by oncology, hyponatremia, hypertension, atrial fibrillation (not on anticoagulation), presented after a mechanical fall. Resulted in a left femoral fracture. Patient was hospitalized for further management.  Past medical history:  Past Medical History  Diagnosis Date  . Hypertension   . Dyslipidemia   . Arrhythmia     atrial fibrillation  . Cholelithiasis   . GERD (gastroesophageal reflux disease)   . Hiatal hernia   . Esophageal stricture   . History of BPH   . History of radiation therapy 01/29/11 -03/15/11    larynx 6600 cGy 33 sessions  . Arthritis   . Anemia     pt's daughter denies  . Multiple myeloma   . 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 Prostate cancer ([place]) 01/23/11    Gleason 3+3=6  . Facial basal cell cancer     nose  . Cataract     bil removed  . Pneumonia     Consultants: Orthopedics  Procedures:  Transthoracic echocardiogram Study Conclusions - Left ventricle: Abnormal septal motion ? from BBB. The cavity size was normal. Wall thickness was increased in a pattern of moderate LVH. Systolic function was normal. The estimated ejection fraction was in the range of 55% to 60%. Wall motion was normal; there were no regional wall motion abnormalities. Doppler parameters are consistent with abnormal left ventricular relaxation (grade 1 diastolic dysfunction). - Aortic valve: There was mild stenosis. Valve area (VTI): 1.84 cm^2. Valve area (Vmax): 1.47 cm^2. Valve area (Vmean): 1.57 cm^2. - Atrial septum: No defect or patent foramen ovale was identified.   Antibiotics: None  Subjective: Patient is pleasantly confused. Feels well this morning. Denies any nausea, vomiting. Denies any pain. Denies cough or diarrhea.    Objective:  Vital Signs  Filed Vitals:   06/02/16 1850 06/02/16 1911 06/02/16 2245 06/03/16 0630  BP: 131/60  118/50 138/60  Pulse: 100  87 98  Temp: 101.3 F (38.5 C)  99 F (37.2 C) 98.4 F (36.9 C)  TempSrc: Oral Oral Oral Oral  Resp: _0 Height:      Weight:      SpO2: 99%  96% 98%    Intake/Output Summary (Last 24 hours) at 06/03/16 0752 Last data filed at 06/03/16 0500  Gross per 24 hour  Intake   1850 ml  Output   1475 ml  Net    375 ml   Filed Weights   06/02/16 0745  Weight: 77.111 kg (170 lb)    General appearance: alert, cooperative, distracted Resp: Diminished air entry at the bases. No wheezing, rales or rhonchi. Cardio: S1, S2 is normal, regular. Systolic murmur appreciated over the precordium. No S3, S4. No rubs, or bruit. GI: soft, non-tender; bowel sounds normal; no masses,  no organomegaly Extremities: Left leg is externally rotated. Good pulses. Neurologic: Distracted. No focal neurological deficits.  Lab Results:  Data Reviewed: I have personally reviewed following labs and imaging studies  CBC:  Recent Labs Lab 05/27/16 1516 06/02/16 0325 06/02/16 0327 06/02/16 1953 06/03/16 0425  WBC 4.3 9.4  --  6.3 5.6  NEUTROABS 3.0 8.2*  --   --   --   HGB 9.7* 10.4* 9.9* 10.0* 10.3*  HCT 27.0* 28.2* 29.0* 27.4* 29.4*  MCV 95.1 92.8  --  97.9 97.0  PLT 62* 81*  --  85* 86*   Basic Metabolic Panel:  Recent Labs Lab 05/27/16 1329 06/02/16 0327 06/02/16 0804 06/02/16 1503 06/03/16 0425  NA 124* 122* 124* 124* 131*  K 3.1* 3.4* 3.8 4.1 3.7  CL  --  81* 88* 89* 95*  CO2 24  --  _0 GLUCOSE 125 129* 125* 120* 111*  BUN 9._1 CREATININE 0.9 0.80 0.83 0.94 0.88  CALCIUM 9.0  --  9.8 9.4 9.5  MG 1.9  --   --   --   --    GFR: Estimated Creatinine Clearance: 63.7 mL/min (by C-G formula based on Cr of 0.88).  Liver Function Tests:  Recent Labs Lab 05/27/16 1329  AST 23  ALT 23  ALKPHOS 67  BILITOT 1.21*   PROT 5.9*  ALBUMIN 3.5   Urine analysis:    Component Value Date/Time   COLORURINE YELLOW 06/02/2016 0423   APPEARANCEUR CLOUDY* 06/02/2016 0423   LABSPEC 1.015 06/02/2016 0423   PHURINE 7.0 06/02/2016 0423   GLUCOSEU NEGATIVE 06/02/2016 0423   HGBUR SMALL* 06/02/2016 0423   BILIRUBINUR NEGATIVE 06/02/2016 0423   KETONESUR NEGATIVE 06/02/2016 0423   PROTEINUR NEGATIVE 06/02/2016 0423   UROBILINOGEN 1.0 07/26/2010 1230   NITRITE NEGATIVE 06/02/2016 0423   LEUKOCYTESUR NEGATIVE 06/02/2016 0423     Radiology Studies: Dg Chest 2 View  06/02/2016  CLINICAL DATA:  Year-old male with fall and left hip fracture per EXAM: CHEST  2 VIEW COMPARISON:  Radiograph dated 05/16/2016 and chest CT dated 11/28/2014 FINDINGS: Two views of the chest do not demonstrate a focal consolidation. There is no pleural effusion or pneumothorax. Postsurgical changes of resection of right upper anterior pleural/chest wall mass seen on the prior CT. The cardiac silhouette is within normal limits. There is degenerative changes of the spine. No acute fracture IMPRESSION: No acute cardiopulmonary process. Electronically Signed   By: Anner Crete M.D.   On: 06/02/2016 04:03   Dg Chest Port 1 View  06/02/2016  CLINICAL DATA:  82 year old male with fever. Scheduled for hip surgery tomorrow. EXAM: PORTABLE CHEST 1 VIEW COMPARISON:  Radiograph dated 06/02/2016 FINDINGS: Single portable view of the chest do not demonstrate a focal consolidation. There is no pleural effusion or pneumothorax. A linear lucency along the lateral aspect of the right lung represent skin fold artifact. Postsurgical changes of the right upper lobe with multiple anterior rib resections and surgical clips. The cardiac silhouette is within normal limits. No acute osseous pathology. IMPRESSION: No active disease. Electronically Signed   By: Anner Crete M.D.   On: 06/02/2016 20:11   Dg Hip Unilat With Pelvis 2-3 Views Left  06/02/2016  CLINICAL  DATA:  82 year old male with fall and left hip pain. EXAM: DG HIP (WITH OR WITHOUT PELVIS) 2-3V LEFT COMPARISON:  None. FINDINGS: There is a transverse fracture of the left femoral neck with mild proximal migration of the femoral shaft. The head of the left femur remains in anatomic alignment with the acetabulum. No other acute fracture identified. The bones are osteopenic. Right hip arthroplasty. IMPRESSION: Transverse fracture of the left femoral neck. Electronically Signed   By: Anner Crete M.D.   On: 06/02/2016 04:01     Medications:  Scheduled: . dexamethasone  40 mg Oral Weekly  . multivitamin with minerals  1 tablet Oral q morning - 10a  . pantoprazole  40 mg Oral Daily  . pomalidomide  3 mg  Oral Daily  . sodium chloride  1 g Oral TID WC   Continuous: . sodium chloride 75 mL/hr at 06/02/16 2000   QUI:[id number], morphine injection  Assessment/Plan:  Principal Problem:   Left displaced femoral neck fracture (HCC) Active Problems:   Essential hypertension, benign   Multiple myeloma (HCC)   Hypokalemia   Pancytopenia (HCC)    Left displaced femoral neck fracture after mechanical fall Orthopedics has evaluated the patient. Plan is for surgery today. Patient was noted to have a systolic murmur and underwent an echocardiogram. This shows normal systolic function. Grade 1 diastolic dysfunction noted. He only has mild aortic stenosis. From a cardiac standpoint, he does not need any further testing prior to undergoing his procedure. His sodium level is improved. His platelet count is sufficient for him to undergo this surgical procedure.   Fever Patient had a fever overnight. He is afebrile this morning. Chest x-ray did not show any infiltrates. UA is unremarkable. He denies any symptoms and is otherwise completely stable. His skin examination does not reveal any cellulitis. Hold off on antibiotics for now. Continue to monitor clinically.  Multiple myeloma Patient is  followed by Dr. Benay Spice of oncology. Discussed with him this morning. Patient was recently started on [place] every Monday. Dose will be given today.  Thrombocytopenia Low platelet counts, likely due to his history of multiple myeloma. Will need transfusion for surgery if counts are below 50,000. Discussed with the patient's oncologist today. No need for transfusion at this time.  Normocytic anemia Monitor hemoglobin closely. Transfuse as needed.  Hyponatremia Appears to have some degree of chronic hyponatremia. Apparently he has also been drinking a lot of water recently. Also on hydrochlorothiazide at home. Etiology for his hyponatremia is multifactorial. His sodium level has improved with normal saline, fluid restriction and sodium tablets. Should be able to undergo surgery at this level.  Hypokalemia Repleted  Essential hypertension Monitor blood pressures closely. Reasonably well controlled for now. Holding his home medications.  GERD  Protonix  DVT Prophylaxis: SCDs    Code Status: Full code  Family Communication: Discussed with the patient. And with his daughter. Disposition Plan: Plan as above. Okay to proceed to surgery from medical standpoint.    LOS: 1 day   [facility] Pager [phone] 06/03/2016, 7:52 AM  If 7PM-7AM, please contact night-coverage at [email or web address], password [facility]

## 2016-06-03 NOTE — Progress Notes (Signed)
Initial Nutrition Assessment  INTERVENTION:   Diet advancement per MD Upon diet advancement, provide Ensure Enlive po BID, each supplement provides 350 kcal and 20 grams of protein RD to continue to monitor  NUTRITION DIAGNOSIS:   Inadequate oral intake related to inability to eat as evidenced by NPO status.  GOAL:   Patient will meet greater than or equal to 90% of their needs  MONITOR:   Diet advancement, Labs, Weight trends, I & O's  REASON FOR ASSESSMENT:   Malnutrition Screening Tool    ASSESSMENT:   82 year old Caucasian male with past medical history of multiple myeloma, followed by oncology, hyponatremia, hypertension, atrial fibrillation (not on anticoagulation), presented after a mechanical fall. Resulted in a left femoral fracture.  Pt in room with no family at bedside. Pt presents seemed distracted during RD visit. Pt reports poor appetite since cancer treatments began. Currently still receiving chemo. Pt denies any swallowing or chewing issues. Pt is NPO for pending surgery today. Agreeable to receiving ensure supplements once diet is advanced, states he feels a little hungry.  Per weight history, pt has lost 6 lb since 5/19 (3% wt loss x >1 month, insignificant for time frame). Nutrition focused physical exam shows no sign of depletion of muscle mass or body fat.  Medications: Multivitamin with minerals daily Labs reviewed: Low Na  Diet Order:  Diet NPO time specified  Skin:  Reviewed, no issues  Last BM:  6/25  Height:   Ht Readings from Last 1 Encounters:  06/02/16 _0  (1.727 m)    Weight:   Wt Readings from Last 1 Encounters:  06/02/16 170 lb (77.111 kg)    Ideal Body Weight:  70 kg  BMI:  Body mass index is 25.85 kg/(m^2).  Estimated Nutritional Needs:   Kcal:  1900-2100  Protein:  90-100g  Fluid:  1.9L/day  EDUCATION NEEDS:   No education needs identified at this time  Clayton Bibles, MS, RD, LDN Pager: [phone] After Hours  Pager: [phone]

## 2016-06-03 NOTE — Clinical Social Work Note (Signed)
Clinical Social Work Assessment  Patient Details  Name: Richard [name] MRN: [id number] Date of Birth: 04-11-1933  Date of referral:  06/03/16               Reason for consult:  Facility Placement, Discharge Planning                Permission sought to share information with:  Facility Art therapist granted to share information::  Yes, Verbal Permission Granted  Name::        Agency::     Relationship::     Contact Information:     Housing/Transportation Living arrangements for the past 2 months:  Single Family Home Source of Information:    Patient Interpreter Needed:  None Criminal Activity/Legal Involvement Pertinent to Current Situation/Hospitalization:    Significant Relationships:  Adult Children Lives with:  Self Do you feel safe going back to the place where you live?  No (SNF needed) Need for family participation in patient care:  Yes (Comment)  Care giving concerns:  Pt will need more care than is available at home following hospital d/c.   Social Worker assessment / plan:  Pt hospitalized on 06/02/16 with a femoral neck fx. Pt will have surgery today. CSW met with pt to assist with d/c planning. Pt feels he will need ST Rehab at d/c. " I live by myself. I've been to [facility] for rehab. " Pt gave CSW permission to speak with his daughter. CSW will meet again with pt / and contact pt's daughter following surgery and PT recommendations. Pt has [employer] which requires prior authorization for SNF placement. CSW will assist with authorization process once PT recommendations are available.  Employment status:  Retired Nurse, adult PT Recommendations:  Not assessed at this time Information / Referral to community resources:  [place]  Patient/Family's Response to care:  Pt / daughter feel ST Rehab is needed.  Patient/Family's Understanding of and Emotional Response to Diagnosis, Current  Treatment, and Prognosis:  Pt is aware of his medical status and surgery is pending. " I'm really sore. I'll be happy when surgery is over. " Emotional support provided.  Emotional Assessment Appearance:  Appears stated age Attitude/Demeanor/Rapport:  Other (cooperative) Affect (typically observed):  Calm, Appropriate, Pleasant Orientation:  Oriented to Self, Oriented to Place, Oriented to  Time, Oriented to Situation Alcohol / Substance use:  Not Applicable Psych involvement (Current and /or in the community):  No (Comment)  Discharge Needs  Concerns to be addressed:  Discharge Planning Concerns Readmission within the last 30 days:  No Current discharge risk:  None Barriers to Discharge:  No Barriers Identified   Loraine Maple  [phone] 06/03/2016, 3:35 PM

## 2016-06-04 ENCOUNTER — Ambulatory Visit: Payer: Medicare Other | Admitting: Nurse Practitioner

## 2016-06-04 ENCOUNTER — Other Ambulatory Visit: Payer: Medicare Other

## 2016-06-04 ENCOUNTER — Ambulatory Visit: Payer: Medicare Other

## 2016-06-04 ENCOUNTER — Encounter (HOSPITAL_COMMUNITY): Payer: Self-pay | Admitting: Orthopedic Surgery

## 2016-06-04 LAB — BASIC METABOLIC PANEL
Anion gap: 6 (ref 5–15)
BUN: 25 mg/dL — AB (ref 6–20)
CALCIUM: 8.7 mg/dL — AB (ref 8.9–10.3)
CO2: 26 mmol/L (ref 22–32)
Chloride: 100 mmol/L — ABNORMAL LOW (ref 101–111)
Creatinine, Ser: 0.86 mg/dL (ref 0.61–1.24)
GFR calc Af Amer: >60 mL/min (ref >60)
GLUCOSE: 161 mg/dL — AB (ref 65–99)
POTASSIUM: 4.4 mmol/L (ref 3.5–5.1)
SODIUM: 132 mmol/L — AB (ref 135–145)

## 2016-06-04 LAB — CBC
HCT: 25.4 % — ABNORMAL LOW (ref 39.0–52.0)
Hemoglobin: 8.9 g/dL — ABNORMAL LOW (ref 13.0–17.0)
MCH: 34 pg (ref 26.0–34.0)
MCHC: 35 g/dL (ref 30.0–36.0)
MCV: 96.9 fL (ref 78.0–100.0)
PLATELETS: 65 10*3/uL — AB (ref 150–400)
RBC: 2.62 MIL/uL — AB (ref 4.22–5.81)
RDW: 14.7 % (ref 11.5–15.5)
WBC: 5.3 10*3/uL (ref 4.0–10.5)

## 2016-06-04 LAB — CORTISOL: Cortisol, Plasma: 2.5 ug/dL

## 2016-06-04 LAB — CBC AND DIFFERENTIAL
HEMATOCRIT: 22 % — AB (ref 41–53)
Hemoglobin: 8 g/dL — AB (ref 13.5–17.5)
PLATELETS: 66 10*3/uL — AB (ref 150–399)
WBC: 6.5 10^3/mL

## 2016-06-04 LAB — TSH: TSH: 0.512 u[IU]/mL (ref 0.350–4.500)

## 2016-06-04 LAB — SODIUM, URINE, RANDOM: Sodium, Ur: 22 mmol/L

## 2016-06-04 LAB — OSMOLALITY: Osmolality: 284 mOsm/kg (ref 275–295)

## 2016-06-04 LAB — OSMOLALITY, URINE: Osmolality, Ur: 629 mOsm/kg (ref 300–900)

## 2016-06-04 MED ORDER — MAGNESIUM CITRATE PO SOLN: 1.0000 | Freq: Once | ORAL | Status: DC | PRN

## 2016-06-04 MED ORDER — BISACODYL 10 MG RE SUPP: 10.0000 mg | Freq: Every day | RECTAL | Status: DC | PRN

## 2016-06-04 MED ORDER — DOCUSATE SODIUM 100 MG PO CAPS
100.0000 mg | ORAL_CAPSULE | Freq: Two times a day (BID) | ORAL | Status: DC
  Administered 2016-06-04 – 2016-06-05 (×4): 100 mg via ORAL
  Filled 2016-06-04 (×4): qty 1

## 2016-06-04 MED ORDER — FERROUS SULFATE 325 (65 FE) MG PO TABS
325.0000 mg | ORAL_TABLET | Freq: Three times a day (TID) | ORAL | Status: DC
  Administered 2016-06-04 – 2016-06-05 (×5): 325 mg via ORAL
  Filled 2016-06-04 (×5): qty 1

## 2016-06-04 MED ORDER — HYDROCODONE-ACETAMINOPHEN 5-325 MG PO TABS
1.0000 | ORAL_TABLET | Freq: Four times a day (QID) | ORAL | Status: DC | PRN
  Administered 2016-06-04: 1 via ORAL
  Administered 2016-06-04: 2 via ORAL
  Filled 2016-06-04: qty 1
  Filled 2016-06-04: qty 2
  Filled 2016-06-04: qty 1

## 2016-06-04 MED ORDER — METOCLOPRAMIDE HCL 5 MG PO TABS: 5.0000 mg | ORAL_TABLET | Freq: Three times a day (TID) | ORAL | Status: DC | PRN

## 2016-06-04 MED ORDER — METOCLOPRAMIDE HCL 5 MG/ML IJ SOLN: 5.0000 mg | Freq: Three times a day (TID) | INTRAMUSCULAR | Status: DC | PRN

## 2016-06-04 MED ORDER — ONDANSETRON HCL 4 MG/2ML IJ SOLN: 4.0000 mg | Freq: Four times a day (QID) | INTRAMUSCULAR | Status: DC | PRN

## 2016-06-04 MED ORDER — PHENOL 1.4 % MT LIQD
1.0000 | OROMUCOSAL | Status: DC | PRN
  Filled 2016-06-04: qty 177

## 2016-06-04 MED ORDER — MENTHOL 3 MG MT LOZG
1.0000 | LOZENGE | OROMUCOSAL | Status: DC | PRN
Start: 2016-06-04 — End: 2016-06-05

## 2016-06-04 MED ORDER — ONDANSETRON HCL 4 MG PO TABS: 4.0000 mg | ORAL_TABLET | Freq: Four times a day (QID) | ORAL | Status: DC | PRN

## 2016-06-04 MED ORDER — ENSURE ENLIVE PO LIQD
237.0000 mL | Freq: Two times a day (BID) | ORAL | Status: DC
  Administered 2016-06-04 – 2016-06-05 (×3): 237 mL via ORAL

## 2016-06-04 MED ORDER — CEFAZOLIN SODIUM-DEXTROSE 2-4 GM/100ML-% IV SOLN
2.0000 g | Freq: Four times a day (QID) | INTRAVENOUS | Status: AC
  Administered 2016-06-04 (×2): 2 g via INTRAVENOUS
  Filled 2016-06-04: qty 100

## 2016-06-04 MED ORDER — POLYETHYLENE GLYCOL 3350 17 G PO PACK: 17.0000 g | PACK | Freq: Every day | ORAL | Status: DC | PRN

## 2016-06-04 NOTE — NC FL2 (Signed)
[place] MEDICAID FL2 LEVEL OF CARE SCREENING TOOL     IDENTIFICATION  Patient Name: Richard [name] Birthdate: 08/14/1933 Sex: male Admission Date (Current Location): 06/02/2016  [facility] and [place] Number:  Herbalist and Address:  [facility],  [place] [address], [place]      Provider Number: [id number]  Attending Physician Name and Address:  Bonnell Public, MD  Relative Name and Phone Number:       Current Level of Care: Hospital Recommended Level of Care:   Prior Approval Number:    Date Approved/Denied:   PASRR Number: [id number]  Discharge Plan: SNF    Current Diagnoses: Patient Active Problem List   Diagnosis Date Noted  . Hyponatremia 06/03/2016  . Left displaced femoral neck fracture ([place]) 06/02/2016  . Hip fracture ([place]) 06/02/2016  . Hordeolum externum (stye) 05/28/2016  . Hypokalemia 05/28/2016  . Dehydration 05/28/2016  . Pancytopenia ([place]) 05/28/2016  . Hyperbilirubinemia 05/28/2016  . 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 Multiple myeloma ([place]) 06/23/2012  . 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 Prostate cancer ([place]) 12/24/2011  . Multiple myeloma ([place]) 12/23/2011  . Essential hypertension, benign 10/10/2010  . ESOPHAGEAL STRICTURE 08/28/2010  . CHOLELITHIASIS 08/28/2010  . DYSPHAGIA UNSPECIFIED 08/28/2010  . ABNORMAL FINDINGS GI TRACT 08/28/2010  . GERD 08/23/2010  . CHOLELITHIASIS 08/23/2010  . HYPERTENSION, PULMONARY 08/10/2010  . ATRIAL FIBRILLATION 08/10/2010  . MURMUR 08/10/2010    Orientation RESPIRATION BLADDER Height & Weight     Self, Situation, Place, Time (Pt can become disoriented at times.)  Normal Incontinent Weight: 77.111 kg (170 lb) Height:  5' 8"  (172.7 cm)  BEHAVIORAL SYMPTOMS/MOOD NEUROLOGICAL BOWEL NUTRITION STATUS  Other (Comment) (no behaviors)   Continent Diet  AMBULATORY STATUS COMMUNICATION OF NEEDS Skin   Extensive Assist Verbally Surgical wounds                        Personal Care Assistance Level of Assistance  Bathing, Feeding, Dressing Bathing Assistance: Limited assistance Feeding assistance: Independent Dressing Assistance: Limited assistance     Functional Limitations Info  Sight, Hearing, Speech Sight Info: Adequate Hearing Info: Adequate Speech Info: Adequate    SPECIAL CARE FACTORS FREQUENCY  PT (By licensed PT), OT (By licensed OT)     PT Frequency: 5-6 xwk OT Frequency: 5-6 x wk            Contractures Contractures Info: Not present    Additional Factors Info  Code Status Code Status Info: Full Code             Current Medications (06/04/2016):  This is the current hospital active medication list Current Facility-Administered Medications  Medication Dose Route Frequency Provider Last Rate Last Dose  . 0.9 %  sodium chloride infusion   Intravenous Continuous Norval Morton, MD 75 mL/hr at 06/03/16 2052 75 mL/hr at 06/03/16 2052  . acetaminophen (TYLENOL) tablet 650 mg  650 mg Oral Q6H PRN Hewitt Shorts [place], PA-C   1,000 mg at 06/03/16 2230  . bisacodyl (DULCOLAX) suppository 10 mg  10 mg Rectal Daily PRN Danae Orleans, PA-C      . ceFAZolin (ANCEF) IVPB 2g/100 mL premix  2 g Intravenous Q6H Danae Orleans, PA-C   2 g at 06/04/16 0951  . dexamethasone (DECADRON) tablet 40 mg  40 mg Oral Weekly Bonnielee Haff, MD   40 mg at 06/03/16 1100  . docusate sodium (COLACE) capsule 100 mg  100 mg Oral BID Danae Orleans, PA-C   100 mg at 06/04/16 0951  . ferrous sulfate tablet 325 mg  325 mg Oral TID PC Danae Orleans, PA-C   325 mg at 06/04/16 0951  . HYDROcodone-acetaminophen (NORCO/VICODIN) 5-325 MG per tablet 1-2 tablet  1-2 tablet Oral Q6H PRN Danae Orleans, PA-C   2 tablet at 06/04/16 0640  . magnesium citrate solution 1 Bottle  1 Bottle Oral Once PRN Danae Orleans, PA-C      . menthol-cetylpyridinium (CEPACOL) lozenge 3 mg  1 lozenge Oral PRN Danae Orleans, PA-C       Or  . phenol (CHLORASEPTIC) mouth spray 1 spray  1  spray Mouth/Throat PRN Danae Orleans, PA-C      . methocarbamol (ROBAXIN) tablet 500 mg  500 mg Oral Q6H PRN Danae Orleans, PA-C   500 mg at 06/04/16 3244   Or  . methocarbamol (ROBAXIN) 500 mg in dextrose 5 % 50 mL IVPB  500 mg Intravenous Q6H PRN Danae Orleans, PA-C      . metoCLOPramide (REGLAN) tablet 5-10 mg  5-10 mg Oral Q8H PRN Danae Orleans, PA-C       Or  . metoCLOPramide (REGLAN) injection 5-10 mg  5-10 mg Intravenous Q8H PRN Danae Orleans, PA-C      . morphine 2 MG/ML injection 1-2 mg  1-2 mg Intravenous Q2H PRN Norval Morton, MD   2 mg at 06/03/16 1906  . multivitamin with minerals tablet 1 tablet  1 tablet Oral q morning - 10a Bonnielee Haff, MD   1 tablet at 06/04/16 0951  . ondansetron (ZOFRAN) tablet 4 mg  4 mg Oral Q6H PRN Danae Orleans, PA-C       Or  . ondansetron [facility]) injection 4 mg  4 mg Intravenous Q6H PRN Danae Orleans, PA-C      . pantoprazole (PROTONIX) EC tablet 40 mg  40 mg Oral Daily Norval Morton, MD   40 mg at 06/04/16 0951  . polyethylene glycol (MIRALAX / GLYCOLAX) packet 17 g  17 g Oral Daily PRN Danae Orleans, PA-C      . pomalidomide (POMALYST) capsule 3 mg  3 mg Oral Daily Bonnielee Haff, MD   3 mg at 06/02/16 1448  . sodium chloride tablet 1 g  1 g Oral TID WC Bonnielee Haff, MD   1 g at 06/04/16 0102     Discharge Medications: Please see discharge summary for a list of discharge medications.  Relevant Imaging Results:  Relevant Lab Results:   Additional Information ss 3 [id number]  Kyliee Ortego, Randall An, LCSW

## 2016-06-04 NOTE — Progress Notes (Signed)
Patient ID: Richard [name], male   DOB: 03-05-1933, 82 y.o.   MRN: [id number] Subjective: 1 Day Post-Op Procedure(s) (LRB): ARTHROPLASTY BIPOLAR HIP LEFT  (HEMIARTHROPLASTY) (Left)    Patient pleasantly confused since yesterday, no events reported.  Illene Regulus his Oncologist came by to visit, reviewed platelet issue feels this can be observed  Objective:   VITALS:   Filed Vitals:   06/04/16 0658 06/04/16 1014  BP: 143/68 131/60  Pulse: 89 98  Temp: 98 F (36.7 C) 98.3 F (36.8 C)  Resp: 16 18    Neurovascular intact Incision: dressing C/D/I  LABS  Recent Labs  06/02/16 1953 06/03/16 0425 06/04/16 0413  HGB 10.0* 10.3* 8.9*  HCT 27.4* 29.4* 25.4*  WBC 6.3 5.6 5.3  PLT 85* 86* 65*     Recent Labs  06/02/16 1503 06/03/16 0425 06/04/16 0413  NA 124* 131* 132*  K 4.1 3.7 4.4  BUN 16 17 25*  CREATININE 0.94 0.88 0.86  GLUCOSE 120* 111* 161*    No results for input(s): LABPT, INR in the last 72 hours.   Assessment/Plan: 1 Day Post-Op Procedure(s) (LRB): ARTHROPLASTY BIPOLAR HIP LEFT  (HEMIARTHROPLASTY) (Left)   Advance diet Up with therapy if cooperative enough Discharge to SNF when stable  Daughter to be involved with decisions

## 2016-06-04 NOTE — Progress Notes (Signed)
Pt / daughter have chosen [employer] . CSW will request [employer] medicare authorization once PT recommendations are available.  Werner Lean LCSW [phone]

## 2016-06-04 NOTE — Progress Notes (Signed)
Pt noted to have severe abrasion to left outer eye. [place] paged and instructed to observe eye. If symptoms get worse to page MD back.

## 2016-06-04 NOTE — Progress Notes (Signed)
Pt was sitting up in chair when I arrived. He was awake and alert and opened the conversation in [place]. Pt enjoyed talking about his family, history of origin, how he learned Spanish and his daughter whom he raised alone. [place] provided presence and support as needed. Please page if additional support is needed. Chaplain Ernest Haber, M.Div.   06/04/16 2100  Clinical Encounter Type  Visited With Patient

## 2016-06-04 NOTE — Progress Notes (Signed)
[facility] PROGRESS NOTE  Richard [name] [id number] DOB: 10/15/1933 DOA: 06/02/2016  PCP: Rachell Cipro, MD  Brief HPI: 82 year old Caucasian male with past medical history of multiple myeloma, followed by oncology, hyponatremia, hypertension, atrial fibrillation (not on anticoagulation), presented after a mechanical fall. Resulted in a left femoral fracture. Patient was hospitalized for further management.  Past medical history:  Past Medical History  Diagnosis Date  . Hypertension   . Dyslipidemia   . Arrhythmia     atrial fibrillation  . Cholelithiasis   . GERD (gastroesophageal reflux disease)   . Hiatal hernia   . Esophageal stricture   . History of BPH   . History of radiation therapy 01/29/11 -03/15/11    larynx 6600 cGy 33 sessions  . Arthritis   . Anemia     pt's daughter denies  . Multiple myeloma   . 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 Prostate cancer ([place]) 01/23/11    Gleason 3+3=6  . Facial basal cell cancer     nose  . Cataract     bil removed  . Pneumonia     Consultants: Orthopedics  Procedures:  Transthoracic echocardiogram Study Conclusions - Left ventricle: Abnormal septal motion ? from BBB. The cavity size was normal. Wall thickness was increased in a pattern of moderate LVH. Systolic function was normal. The estimated ejection fraction was in the range of 55% to 60%. Wall motion was normal; there were no regional wall motion abnormalities. Doppler parameters are consistent with abnormal left ventricular relaxation (grade 1 diastolic dysfunction). - Aortic valve: There was mild stenosis. Valve area (VTI): 1.84 cm^2. Valve area (Vmax): 1.47 cm^2. Valve area (Vmean): 1.57 cm^2. - Atrial septum: No defect or patent foramen ovale was identified.   Antibiotics: None  Subjective: Patient seen. Nil new complaints. Remains confused. No documented fever.  Objective:  Vital Signs  Filed Vitals:   06/04/16 0009 06/04/16  0051 06/04/16 0658 06/04/16 1014  BP: 134/68 133/70 143/68 131/60  Pulse: 83 79 89 98  Temp: 97.5 F (36.4 C) 97.5 F (36.4 C) 98 F (36.7 C) 98.3 F (36.8 C)  TempSrc: Oral Oral Oral Oral  Resp: 16 16 16 18   Height:      Weight:      SpO2: 98% 100% 99% 100%    Intake/Output Summary (Last 24 hours) at 06/04/16 1103 Last data filed at 06/04/16 1017  Gross per 24 hour  Intake   4135 ml  Output   1600 ml  Net   2535 ml   Filed Weights   06/02/16 0745  Weight: 77.111 kg (170 lb)    General appearance: alert, cooperative, distracted Resp: Diminished air entry at the bases. No wheezing, rales or rhonchi. Cardio: S1, S2 is normal, regular. Systolic murmur appreciated over the precordium. No S3, S4. No rubs, or bruit. GI: soft, non-tender; bowel sounds normal; no masses,  no organomegaly Extremities: Left leg is externally rotated. Good pulses. Neurologic: Distracted. No focal neurological deficits.  Lab Results:  Data Reviewed: I have personally reviewed following labs and imaging studies  CBC:  Recent Labs Lab 06/02/16 0325 06/02/16 0327 06/02/16 1953 06/03/16 0425 06/04/16 0413  WBC 9.4  --  6.3 5.6 5.3  NEUTROABS 8.2*  --   --   --   --   HGB 10.4* 9.9* 10.0* 10.3* 8.9*  HCT 28.2* 29.0* 27.4* 29.4* 25.4*  MCV 92.8  --  97.9 97.0 96.9  PLT 81*  --  85* 86* 65*   Basic Metabolic Panel:  Recent Labs Lab 06/02/16 0327 06/02/16 0804 06/02/16 1503 06/03/16 0425 06/04/16 0413  NA 122* 124* 124* 131* 132*  K 3.4* 3.8 4.1 3.7 4.4  CL 81* 88* 89* 95* 100*  CO2  --  30 28 30 26   GLUCOSE 129* 125* 120* 111* 161*  BUN 17 18 16 17  25*  CREATININE 0.80 0.83 0.94 0.88 0.86  CALCIUM  --  9.8 9.4 9.5 8.7*   GFR: Estimated Creatinine Clearance: 65.2 mL/min (by C-G formula based on Cr of 0.86).  Liver Function Tests: No results for input(s): AST, ALT, ALKPHOS, BILITOT, PROT, ALBUMIN in the last 168 hours. Urine analysis:    Component Value Date/Time    COLORURINE YELLOW 06/03/2016 0851   APPEARANCEUR CLEAR 06/03/2016 0851   LABSPEC 1.013 06/03/2016 0851   PHURINE 6.5 06/03/2016 0851   GLUCOSEU NEGATIVE 06/03/2016 0851   HGBUR NEGATIVE 06/03/2016 0851   BILIRUBINUR NEGATIVE 06/03/2016 0851   KETONESUR NEGATIVE 06/03/2016 0851   PROTEINUR NEGATIVE 06/03/2016 0851   UROBILINOGEN 1.0 07/26/2010 1230   NITRITE NEGATIVE 06/03/2016 0851   LEUKOCYTESUR NEGATIVE 06/03/2016 0851     Radiology Studies: Pelvis Portable  06/04/2016  CLINICAL DATA:  Status post left total hip today. EXAM: PORTABLE PELVIS 1-2 VIEWS COMPARISON:  None. FINDINGS: The patient is status post left hip replacement. Hardware is in good position on a single frontal view. IMPRESSION: Appropriate placement of left hip hardware on a single frontal view. Electronically Signed   By: Dorise Bullion III M.D   On: 06/04/2016 01:15   Dg Chest Port 1 View  06/02/2016  CLINICAL DATA:  82 year old male with fever. Scheduled for hip surgery tomorrow. EXAM: PORTABLE CHEST 1 VIEW COMPARISON:  Radiograph dated 06/02/2016 FINDINGS: Single portable view of the chest do not demonstrate a focal consolidation. There is no pleural effusion or pneumothorax. A linear lucency along the lateral aspect of the right lung represent skin fold artifact. Postsurgical changes of the right upper lobe with multiple anterior rib resections and surgical clips. The cardiac silhouette is within normal limits. No acute osseous pathology. IMPRESSION: No active disease. Electronically Signed   By: Anner Crete M.D.   On: 06/02/2016 20:11     Medications:  Scheduled: . dexamethasone  40 mg Oral Weekly  . docusate sodium  100 mg Oral BID  . ferrous sulfate  325 mg Oral TID PC  . multivitamin with minerals  1 tablet Oral q morning - 10a  . pantoprazole  40 mg Oral Daily  . pomalidomide  3 mg Oral Daily  . sodium chloride  1 g Oral TID WC   Continuous: . sodium chloride 75 mL/hr (06/03/16 2052)    WOE:[id number], bisacodyl, HYDROcodone-acetaminophen, magnesium citrate, menthol-cetylpyridinium **OR** phenol, methocarbamol **OR** methocarbamol (ROBAXIN)  IV, metoCLOPramide **OR** metoCLOPramide (REGLAN) injection, morphine injection, ondansetron **OR** ondansetron (ZOFRAN) IV, polyethylene glycol  Assessment/Plan:  Principal Problem:   Left displaced femoral neck fracture (HCC) Active Problems:   Essential hypertension, benign   Multiple myeloma (HCC)   Hypokalemia   Pancytopenia (HCC)   Hyponatremia    - Left displaced femoral neck fracture after mechanical fall - Post op care as per Ortho. - Fever - No documented fever overnight. Continue to monitor. - Multiple myeloma - Oncology input is appreciated. - Thrombocytopenia/Normocytic anemia - will defer to Oncology. 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 Slowly improving. Control pain. - Hypokalemia - Resolved. - Essential hypertension - Optimize. - GERD  DVT Prophylaxis: SCDs    Code Status: Full code  Family Communication: Discussed with the patient. And with his daughter. Disposition Plan: Plan as above. Okay to proceed to surgery from medical standpoint.    LOS: 2 days   [place] [facility] Pager [phone] 06/04/2016, 11:03 AM  If 7PM-7AM, please contact night-coverage at [email or web address], password [facility]

## 2016-06-04 NOTE — Progress Notes (Signed)
Occupational Therapy Evaluation Patient Details Name: Richard [name] MRN: [id number] DOB: [birth year] Today's Date: 06/04/2016    History of Present Illness 82 year old Caucasian male with past medical history of multiple myeloma, followed by oncology, hyponatremia, hypertension, atrial fibrillation (not on anticoagulation), presented after [name] mechanical fall. Resulted in [name] left femoral fracture. S/p ARTHROPLASTY BIPOLAR HIP LEFT (HEMIARTHROPLASTY) (Left) on 06/03/16.   Clinical Impression   Patient presents to OT with decreased ADL independence and safety due to the deficits listed below. He will benefit from skilled OT to maximize function and to facilitate [name] safe discharge. OT will follow.    Follow Up Recommendations  SNF    Equipment Recommendations  Other (comment) (tbd next venue)    Recommendations for Other Services       Precautions / Restrictions Precautions Precautions: Posterior Hip Precaution Booklet Issued: Yes (comment) Precaution Comments: handout given and precautions reviewed with no evidence of learning by patient Restrictions Weight Bearing Restrictions: Yes LLE Weight Bearing: Weight bearing as tolerated      Mobility Bed Mobility Overal bed mobility: Needs Assistance Bed Mobility: Supine to Sit     Supine to sit: Min assist        Transfers Overall transfer level: Needs assistance Equipment used: Rolling walker (2 wheeled) Transfers: Sit to/from Stand Sit to Stand: Min assist;+2 safety/equipment         General transfer comment: chair follow with ambulation    Balance                                            ADL Overall ADL's : Needs assistance/impaired     Grooming: Set up;Sitting       Lower Body Bathing: Total assistance;Adhering to hip precautions;Sit to/from stand       Lower Body Dressing: Total assistance;Adhering to hip precautions;Sit to/from stand   Toilet Transfer: Minimal assistance;+2 for  safety/equipment;Ambulation;Comfort height toilet;BSC;RW   Toileting- Clothing Manipulation and Hygiene: Total assistance;Sit to/from stand       Functional mobility during ADLs: Minimal assistance;+2 for safety/equipment;Rolling walker General ADL Comments: Attempted to educate patient on posterior hip precautions but he was not able to learn due to impaired cognitiion. Difficulty to keep patient's attention to task at hand.     Vision     Perception     Praxis      Pertinent Vitals/Pain Pain Assessment: No/denies pain     Hand Dominance     Extremity/Trunk Assessment Upper Extremity Assessment Upper Extremity Assessment: Overall WFL for tasks assessed   Lower Extremity Assessment Lower Extremity Assessment: Defer to PT evaluation       Communication Communication Communication: No difficulties   Cognition Arousal/Alertness: Awake/alert Behavior During Therapy: WFL for tasks assessed/performed Overall Cognitive Status: No family/caregiver present to determine baseline cognitive functioning       Memory: Decreased recall of precautions;Decreased short-term memory             General Comments       Exercises       Shoulder Instructions      Home Living Family/patient expects to be discharged to:: Skilled nursing facility Living Arrangements: Alone                               Additional Comments: daughter lives nearby per patient  Prior Functioning/Environment Level of Independence: Needs assistance        Comments: chart indicates recent confusion and needing increased assistance    OT Diagnosis: Acute pain;Altered mental status   OT Problem List: Decreased strength;Decreased range of motion;Decreased safety awareness;Decreased knowledge of use of DME or AE;Decreased knowledge of precautions;Pain   OT Treatment/Interventions: Self-care/ADL training;DME and/or AE instruction;Therapeutic activities;Patient/family education     OT Goals(Current goals can be found in the care plan section) Acute Rehab OT Goals Patient Stated Goal: to go to [facility] OT Goal Formulation: With patient Time For Goal Achievement: 06/18/16 Potential to Achieve Goals: Good ADL Goals Pt Will Perform Upper Body Bathing: with set-up;sitting Pt Will Perform Lower Body Bathing: with mod assist;with adaptive equipment;sit to/from stand Pt Will Perform Upper Body Dressing: with set-up;sitting Pt Will Perform Lower Body Dressing: with mod assist;with adaptive equipment;sit to/from stand Pt Will Transfer to Toilet: with supervision;ambulating;bedside commode Pt Will Perform Toileting - Clothing Manipulation and hygiene: with supervision;sit to/from stand  OT Frequency: Min 2X/week   Barriers to D/C: Decreased caregiver support  lives alone       Co-evaluation PT/OT/SLP Co-Evaluation/Treatment: Yes Reason for Co-Treatment: For patient/therapist safety PT goals addressed during session: Mobility/safety with mobility OT goals addressed during session: ADL's and self-care      End of Session Equipment Utilized During Treatment: Rolling walker;Gait belt Nurse Communication: Mobility status  Activity Tolerance: Patient tolerated treatment well Patient left: in chair;with call bell/phone within reach;with chair alarm set   Time: 4037-5436 OT Time Calculation (min): 18 min Charges:  OT General Charges $OT Visit: 1 Procedure OT Evaluation $OT Eval Moderate Complexity: 1 Procedure G-Codes:    Richard [name] 06-19-16, 1:13 PM

## 2016-06-04 NOTE — Care Management Note (Signed)
Case Management Note  Patient Details  Name: WILKES TAGERT MRN: [id number] Date of Birth: 1933/10/03  Subjective/Objective:                  ARTHROPLASTY BIPOLAR HIP LEFT (HEMIARTHROPLASTY) (Left) Action/Plan: Discharge planning Expected Discharge Date:                  Expected Discharge Plan:  [place]  In-House Referral:     Discharge planning Services  CM Consult  Post Acute Care Choice:    Choice offered to:  Patient  DME Arranged:    DME Agency:     HH Arranged:    [place] Agency:     Status of Service:  Completed, signed off  If discussed at [employer], dates discussed:    Additional Comments: CM reviewed and CM notes pt to go to SNF; CSW aware and arranging.  NO other CM needs were communicated. Dellie Catholic, RN 06/04/2016, 3:24 PM

## 2016-06-04 NOTE — Evaluation (Signed)
Physical Therapy Evaluation Patient Details Name: Richard [name] MRN: [id number] DOB: 14-Jun-1933 Today's Date: 06/04/2016   History of Present Illness  82 year old Caucasian male with past medical history of multiple myeloma, followed by oncology, hyponatremia, hypertension, atrial fibrillation (not on anticoagulation), presented after a mechanical fall. Resulted in a left femoral fracture. S/p ARTHROPLASTY BIPOLAR HIP LEFT (HEMIARTHROPLASTY) (Left) on 06/03/16.  Clinical Impression  The patient is pleasantly confused, is oriented to [facility] and date / The patient ambulated x 110' with RW and denied that he was in pain.  Pt admitted with above diagnosis. Pt currently with functional limitations due to the deficits listed below (see PT Problem List).  Pt will benefit from skilled PT to increase their independence and safety with mobility to allow discharge to the venue listed below.       Follow Up Recommendations SNF;Supervision/Assistance - 24 hour    Equipment Recommendations  Rolling walker with 5" wheels    Recommendations for Other Services       Precautions / Restrictions Precautions Precautions: Posterior Hip Precaution Booklet Issued: Yes (comment) Precaution Comments: handout given and precautions reviewed with no evidence of learning by patient Restrictions Weight Bearing Restrictions: Yes LLE Weight Bearing: Weight bearing as tolerated      Mobility  Bed Mobility Overal bed mobility: Needs Assistance Bed Mobility: Supine to Sit     Supine to sit: Min assist     General bed mobility comments: cues for technique,.  Transfers Overall transfer level: Needs assistance Equipment used: Rolling walker (2 wheeled) Transfers: Sit to/from Stand Sit to Stand: Min assist;+2 safety/equipment         General transfer comment: chair follow with ambulation  Ambulation/Gait Ambulation/Gait assistance: Min assist;+2 safety/equipment Ambulation Distance (Feet): 110  Feet Assistive device: Rolling walker (2 wheeled) Gait Pattern/deviations: Step-to pattern;Step-through pattern     General Gait Details: patient is placing full weight on the R  Left leg, multimodal cues for safety and turning  Stairs            Wheelchair Mobility    Modified Rankin (Stroke Patients Only)       Balance Overall balance assessment: History of Falls;Needs assistance Sitting-balance support: No upper extremity supported;Feet supported Sitting balance-Leahy Scale: Good     Standing balance support: During functional activity;Bilateral upper extremity supported Standing balance-Leahy Scale: Fair                               Pertinent Vitals/Pain Pain Assessment: Faces Faces Pain Scale: No hurt    Home Living Family/patient expects to be discharged to:: Skilled nursing facility Living Arrangements: Alone               Additional Comments: daughter lives nearby per patient    Prior Function Level of Independence: Needs assistance         Comments: chart indicates recent confusion and needing increased assistance     Hand Dominance        Extremity/Trunk Assessment   Upper Extremity Assessment: Defer to OT evaluation           Lower Extremity Assessment: LLE deficits/detail   LLE Deficits / Details: moves the Left leg, places weight on the leg  Cervical / Trunk Assessment: Normal  Communication   Communication: No difficulties  Cognition Arousal/Alertness: Awake/alert Behavior During Therapy: WFL for tasks assessed/performed Overall Cognitive Status: Impaired/Different from baseline Area of Impairment: Following commands;Safety/judgement;Awareness  Memory: Decreased recall of precautions;Decreased short-term memory Following Commands: Follows one step commands inconsistently       General Comments: multimodal cues for activity.    General Comments      Exercises        Assessment/Plan    PT  Assessment Patient needs continued PT services  PT Diagnosis Difficulty walking;Altered mental status   PT Problem List Decreased strength;Decreased range of motion;Decreased activity tolerance;Decreased balance;Decreased mobility;Decreased cognition;Decreased knowledge of precautions;Decreased safety awareness;Decreased knowledge of use of DME  PT Treatment Interventions DME instruction;Gait training;Functional mobility training;Therapeutic exercise;Therapeutic activities;Patient/family education   PT Goals (Current goals can be found in the Care Plan section) Acute Rehab PT Goals Patient Stated Goal: to go to [facility] PT Goal Formulation: With patient Time For Goal Achievement: 06/18/16 Potential to Achieve Goals: Good    Frequency Min 3X/week   Barriers to discharge Decreased caregiver support      Co-evaluation PT/OT/SLP Co-Evaluation/Treatment: Yes Reason for Co-Treatment: Complexity of the patient's impairments (multi-system involvement) PT goals addressed during session: Mobility/safety with mobility OT goals addressed during session: ADL's and self-care       End of Session Equipment Utilized During Treatment: Gait belt Activity Tolerance: Patient tolerated treatment well Patient left: in chair;with call bell/phone within reach;with chair alarm set Nurse Communication: Mobility status         Time: 9774-1423 PT Time Calculation (min) (ACUTE ONLY): 20 min   Charges:   PT Evaluation $PT Eval Low Complexity: 1 Procedure     PT G CodesClaretha Cooper 06/04/2016, 3:17 PM Tresa Endo PT [phone]

## 2016-06-04 NOTE — Anesthesia Postprocedure Evaluation (Signed)
Anesthesia Post Note  Patient: Richard [name]  Procedure(s) Performed: Procedure(s) (LRB): ARTHROPLASTY BIPOLAR HIP LEFT  (HEMIARTHROPLASTY) (Left)  Patient location during evaluation: PACU Anesthesia Type: General Level of consciousness: sedated Pain management: pain level controlled Vital Signs Assessment: post-procedure vital signs reviewed and stable Respiratory status: spontaneous breathing and respiratory function stable Cardiovascular status: stable Anesthetic complications: no    Last Vitals:  Filed Vitals:   06/04/16 0009 06/04/16 0051  BP: 134/68 133/70  Pulse: 83 79  Temp: 36.4 C 36.4 C  Resp: 16 16    Last Pain:  Filed Vitals:   06/04/16 0146  PainSc: 4                  Arleta Ostrum DANIEL

## 2016-06-04 NOTE — Clinical Social Work Placement (Signed)
   CLINICAL SOCIAL WORK PLACEMENT  NOTE  Date:  06/04/2016  Patient Details  Name: Richard [name] MRN: [id number] Date of Birth: August 28, 1933  Clinical Social Work is seeking post-discharge placement for this patient at the [place] level of care (*CSW will initial, date and re-position this form in  chart as items are completed):  Yes   Patient/family provided with [facility] Work Department's list of facilities offering this level of care within the geographic area requested by the patient (or if unable, by the patient's family).  Yes   Patient/family informed of their freedom to choose among providers that offer the needed level of care, that participate in Medicare, Medicaid or managed care program needed by the patient, have an available bed and are willing to accept the patient.  Yes   Patient/family informed of [facility] ownership interest in [facility] and [facility], as well as of the fact that they are under no obligation to receive care at these facilities.  PASRR submitted to EDS on 06/03/16     PASRR number received on 06/03/16     Existing PASRR number confirmed on       FL2 transmitted to all facilities in geographic area requested by pt/family on 06/04/16     FL2 transmitted to all facilities within larger geographic area on       Patient informed that his/her managed care company has contracts with or will negotiate with certain facilities, including the following:            Patient/family informed of bed offers received.  Patient chooses bed at       Physician recommends and patient chooses bed at      Patient to be transferred to   on  .  Patient to be transferred to facility by       Patient family notified on   of transfer.  Name of family member notified:        PHYSICIAN       Additional Comment:    _______________________________________________ Luretha Rued, [place]  [phone] 06/04/2016,  11:50 AM

## 2016-06-05 ENCOUNTER — Inpatient Hospital Stay (HOSPITAL_COMMUNITY): Payer: Medicare Other

## 2016-06-05 DIAGNOSIS — R410 Disorientation, unspecified: Secondary | ICD-10-CM | POA: Insufficient documentation

## 2016-06-05 DIAGNOSIS — I10 Essential (primary) hypertension: Secondary | ICD-10-CM

## 2016-06-05 DIAGNOSIS — E871 Hypo-osmolality and hyponatremia: Secondary | ICD-10-CM

## 2016-06-05 DIAGNOSIS — E876 Hypokalemia: Secondary | ICD-10-CM

## 2016-06-05 DIAGNOSIS — S72002D Fracture of unspecified part of neck of left femur, subsequent encounter for closed fracture with routine healing: Secondary | ICD-10-CM

## 2016-06-05 DIAGNOSIS — R4182 Altered mental status, unspecified: Secondary | ICD-10-CM

## 2016-06-05 DIAGNOSIS — D61818 Other pancytopenia: Secondary | ICD-10-CM

## 2016-06-05 LAB — CBC
HCT: 22.3 % — ABNORMAL LOW (ref 39.0–52.0)
HEMOGLOBIN: 8 g/dL — AB (ref 13.0–17.0)
MCH: 33.6 pg (ref 26.0–34.0)
MCHC: 35.9 g/dL (ref 30.0–36.0)
MCV: 93.7 fL (ref 78.0–100.0)
Platelets: 66 10*3/uL — ABNORMAL LOW (ref 150–400)
RBC: 2.38 MIL/uL — AB (ref 4.22–5.81)
RDW: 14.3 % (ref 11.5–15.5)
WBC: 6.5 10*3/uL (ref 4.0–10.5)

## 2016-06-05 LAB — BASIC METABOLIC PANEL
Anion gap: 5 (ref 5–15)
BUN: 27 mg/dL — AB (ref 4–21)
BUN: 27 mg/dL — AB (ref 6–20)
CHLORIDE: 104 mmol/L (ref 101–111)
CO2: 23 mmol/L (ref 22–32)
CREATININE: 0.76 mg/dL (ref 0.61–1.24)
Calcium: 8.4 mg/dL — ABNORMAL LOW (ref 8.9–10.3)
Creatinine: 0.8 mg/dL (ref 0.6–1.3)
GFR calc Af Amer: >60 mL/min (ref >60)
GFR calc non Af Amer: >60 mL/min (ref >60)
GLUCOSE: 133 mg/dL — AB (ref 65–99)
Glucose: 133 mg/dL
POTASSIUM: 3.9 mmol/L (ref 3.5–5.1)
Potassium: 3.9 mmol/L (ref 3.4–5.3)
Sodium: 132 mmol/L — AB (ref 137–147)
Sodium: 132 mmol/L — ABNORMAL LOW (ref 135–145)

## 2016-06-05 MED ORDER — QUETIAPINE FUMARATE 25 MG PO TABS: 25.0000 mg | ORAL_TABLET | Freq: Every day | ORAL | Status: DC

## 2016-06-05 MED ORDER — FERROUS SULFATE 325 (65 FE) MG PO TABS: 325.0000 mg | ORAL_TABLET | Freq: Three times a day (TID) | ORAL | Status: DC

## 2016-06-05 MED ORDER — ACETAMINOPHEN 325 MG PO TABS: 650.0000 mg | ORAL_TABLET | Freq: Four times a day (QID) | ORAL | Status: AC | PRN

## 2016-06-05 MED ORDER — ENSURE ENLIVE PO LIQD: 237.0000 mL | Freq: Two times a day (BID) | ORAL | Status: AC

## 2016-06-05 MED ORDER — PANTOPRAZOLE SODIUM 40 MG PO TBEC: 40.0000 mg | DELAYED_RELEASE_TABLET | Freq: Every day | ORAL | Status: DC

## 2016-06-05 MED ORDER — DOCUSATE SODIUM 100 MG PO CAPS: 100.0000 mg | ORAL_CAPSULE | Freq: Two times a day (BID) | ORAL | Status: DC

## 2016-06-05 MED ORDER — HYDROCODONE-ACETAMINOPHEN 5-325 MG PO TABS: 1.0000 | ORAL_TABLET | Freq: Three times a day (TID) | ORAL | Status: DC | PRN

## 2016-06-05 NOTE — Progress Notes (Signed)
Physical Therapy Treatment Patient Details Name: KLINT LEZCANO MRN: [id number] DOB: 04-Nov-1933 Today's Date: 06/05/2016    History of Present Illness 82 year old Caucasian male with past medical history of multiple myeloma, followed by oncology, hyponatremia, hypertension, atrial fibrillation (not on anticoagulation), presented after a mechanical fall. Resulted in a left femoral fracture. S/p ARTHROPLASTY BIPOLAR HIP LEFT (HEMIARTHROPLASTY) (Left) on 06/03/16.    PT Comments    POD # 3 Pt OOB in recliner.  Pt demonstrates impaired cognition with post op confusion.  Progressing slowly.  Pt need ST Rehab at SNF.    Follow Up Recommendations  SNF;Supervision/Assistance - 24 hour     Equipment Recommendations       Recommendations for Other Services       Precautions / Restrictions Precautions Precautions: Posterior Hip Precaution Booklet Issued: Yes (comment) Precaution Comments: 100% VC's on THP with no evidence of learning Restrictions Weight Bearing Restrictions: No LLE Weight Bearing: Weight bearing as tolerated    Mobility  Bed Mobility               General bed mobility comments: Pt was OOB in recliner  Transfers Overall transfer level: Needs assistance Equipment used: Rolling walker (2 wheeled) Transfers: Sit to/from Stand Sit to Stand: Min assist;+2 safety/equipment         General transfer comment: chair follow with ambulation  Ambulation/Gait Ambulation/Gait assistance: Min assist;Mod assist;+2 safety/equipment;+2 physical assistance Ambulation Distance (Feet): 82 Feet Assistive device: Rolling walker (2 wheeled)   Gait velocity: decreased   General Gait Details: very unsteady gait with decreased safety cognition.  HIGH FALL RISK.  Poor use of walker requiring 100% VC's.     Stairs            Wheelchair Mobility    Modified Rankin (Stroke Patients Only)       Balance                                    Cognition  Arousal/Alertness: Awake/alert   Overall Cognitive Status: Impaired/Different from baseline Area of Impairment: Following commands;Safety/judgement;Awareness     Memory: Decreased recall of precautions;Decreased short-term memory Following Commands: Follows one step commands inconsistently       General Comments: multimodal cues for activity.    Exercises      General Comments        Pertinent Vitals/Pain Pain Assessment: Faces Faces Pain Scale: Hurts a little bit Pain Location: L hip Pain Descriptors / Indicators: Grimacing Pain Intervention(s): Monitored during session;Repositioned    Home Living                      Prior Function            PT Goals (current goals can now be found in the care plan section) Progress towards PT goals: Progressing toward goals    Frequency  Min 3X/week    PT Plan Current plan remains appropriate    Co-evaluation             End of Session Equipment Utilized During Treatment: Gait belt Activity Tolerance: Patient tolerated treatment well Patient left: in chair;with call bell/phone within reach;with chair alarm set     Time: 1027-2536 PT Time Calculation (min) (ACUTE ONLY): 12 min  Charges:  $Gait Training: 8-22 mins                    G  Codes:      Rica Koyanagi  PTA [facility]  Acute  Rehab Pager      [phone]

## 2016-06-05 NOTE — Discharge Summary (Addendum)
Physician Discharge Summary  Richard [name] KGS:[id number] DOB: 1933/10/17 DOA: 06/02/2016  PCP: Rachell Cipro, MD  Admit date: 06/02/2016 Discharge date: 06/05/2016  Time spent: 35 minutes  Recommendations for Outpatient Follow-up:  Please use seroquel daily at bedtime; stop once delirium has cleared Maintain adequate hydration Follow up with orthopedic surgery in 2 weeks  Repeat BMET in 5 days to follow electrolytes and renal function Please follow up CBC in 5 days to follow Hgb and platelets trend  Discharge Diagnoses:  Principal Problem:   Left displaced femoral neck fracture ([place]) Active Problems:   Essential hypertension, benign   Multiple myeloma ([place])   Hypokalemia   Pancytopenia ([place])   Hyponatremia Hospital acquired delirium   Discharge Condition: stable and w/o acute complaints. Patient still slightly confused, but able to follow commands properly. Will be discharge to SNF for physical rehabilitation and further care. Will follow up with orthopedic service in 2 weeks.  Diet recommendation: heart healthy diet   Filed Weights   06/02/16 0745  Weight: 77.111 kg (170 lb)    History of present illness:  A18 y.o. male with medical history significant of multiple myeloma followed by Dr. Benay Spice, prostate cancer, hyponatremia, HTN, HLD, and atrial fibrillation; who presents after having a fall at home yesterday evening while trying to use the restroom. Patient notes that he had tried to get up, but was unable to bear weight due to pain in the left hip. He denies any loss of consciousness or trauma to his head with the fall. He notes that his daughter had advised him to drink more water as it appears he had become dehydrated previously. Associated symptoms include some urinary frequency, hesitancy, incomplete evacuation, and some mild confusion. Patient denies shortness of breath, chest pain, nausea, vomiting, or abdominal pain. He notes that he previously had to have a right  hip replacement which was performed by Dr. Wynelle Link.   Hospital Course:  1-left displaced hip fracture -s/p left hemiarthroplasty by Dr. Alvan Dame on 6/26 -procedure well tolerated  -will be follow up in 2 weeks by Dr. Alvan Dame -tylenol and vicodin for pain controlled -SNF for rehabilitation   2-Multiple, Myeloma: in relapse -actively follow by Dr. 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  4-hyponatremia/hypokalemia: most likely associated with diuretics and dehydration. -essentially repleted and WNL after IVF's -Na 132 at discharge and potassium 3.9 -will recommend follow up of BMET in 5 days  5-essential HTN -stable -will continue current antihypertensive regimen  -heart healthy diet recommended   6-AMS/Delirium -MRI ordered to r/o acute abnormalities, but patient non cooperative with exam (unable to remain still) -low dose seroquel to assist with hospital acquired delirium ordered -per discussion with oncologist, concerns for presumed underlying dementia  -will continue supportive care, constant reorientation and minimize medications that can cause obtundation/AMS.  7-BPH -will continue flomax  Procedures:  See below for x-ray reports  ARTHROPLASTY BIPOLAR HIP LEFT (HEMIARTHROPLASTY)  06/03/16  Transthoracic echocardiogram Study Conclusions - Left ventricle: Abnormal septal motion ? from BBB. The cavity size was normal. Wall thickness was increased in a pattern of moderate LVH. Systolic function was normal. The estimated ejection fraction was in the range of 55% to 60%. Wall motion was  normal; there were no regional wall motion abnormalities.  Doppler parameters are consistent with abnormal left ventricular relaxation (grade 1 diastolic dysfunction). - Aortic valve: There was mild stenosis. Valve area (VTI): 1.84 cm^2. Valve area (Vmax): 1.47 cm^2. Valve area (Vmean): 1.57 cm^2. - Atrial septum: No defect or patent foramen ovale was identified.  Consultations:  Oncology  Orthopedic service   Discharge Exam: Filed Vitals:   06/05/16 0407 06/05/16 1411  BP: 139/81 151/86  Pulse: 100 97  Temp: 97.8 F (36.6 C) 98.6 F (37 C)  Resp: 16 18   General appearance: alert, cooperative, confused and with overall impaired judgement. No fever and denying any headaches. Patient reports hip pain is mild. Resp: good air movement. No wheezing, rales or rhonchi. Cardio: S1, S2 is normal, regular. Systolic murmur appreciated over the precordium. No S3, S4. No rubs, or bruit. GI: soft, non-tender; bowel sounds normal; no masses, no organomegaly Extremities: Good pulses; LLE with clean wound and intact dressing; no  Cyanosis. Neurologic: No focal neurological deficits.   Discharge Instructions   Discharge Instructions    Diet - low sodium heart healthy    Complete by:  As directed      Discharge instructions    Complete by:  As directed   Take medications as prescribed Please use seroquel daily at bedtime; stop once delirium has cleared Maintain adequate hydration Follow up with orthopedic surgery in 2 weeks  Rehabilitation as per SNF protocol Repeat BMET in 5 days to follow electrolytes and renal function Please follow up CBC in 5 days to follow Hgb and platelets trend     Increase activity slowly    Complete by:  As directed           Current Discharge Medication List    START taking these medications   Details  acetaminophen (TYLENOL) 325 MG tablet Take 2 tablets (650 mg total) by mouth every 6 (six) hours as needed for mild pain, fever or headache.    docusate sodium (COLACE) 100 MG capsule Take 1 capsule (100 mg  total) by mouth 2 (two) times daily.    feeding supplement, ENSURE ENLIVE, (ENSURE ENLIVE) LIQD Take 237 mLs by mouth 2 (two) times daily between meals.    ferrous sulfate 325 (65 FE) MG tablet Take 1 tablet (325 mg total) by mouth 3 (three) times daily after meals.    HYDROcodone-acetaminophen (NORCO/VICODIN) 5-325 MG tablet Take 1-2 tablets by mouth every 8 (eight) hours as needed for severe pain. Qty: 20 tablet, Refills: 0    QUEtiapine (SEROQUEL) 25 MG tablet Take 1 tablet (25 mg total) by mouth at bedtime. To be given as part of treatment for acquired delirium; stop once mentation back to baseline      CONTINUE these medications which have CHANGED   Details  pantoprazole (PROTONIX) 40 MG tablet Take 1 tablet (40 mg total) by mouth daily.      CONTINUE these medications which have NOT CHANGED   Details  aspirin EC 81 MG tablet Take 81 mg by mouth every morning.    bacitracin-polymyxin b (POLYSPORIN) ophthalmic ointment Place 1 application into both eyes 4 (four) times daily. Qty: 3.5 g, Refills: 0   Associated Diagnoses: Dehydration; Multiple myeloma not having achieved remission ([place]); Stye external, right    Cholecalciferol 5000 UNITS TABS Take 5,000 Units by mouth every morning.    Associated Diagnoses: Multiple myeloma ([facility])    Cyanocobalamin (B-12) 2500 MCG SUBL Place 2,500 mcg under the tongue every morning.  dexamethasone (DECADRON) 4 MG tablet Take 40 mg by mouth once a week. Take 10 tablets by mouth once weekly on Mondays    losartan-hydrochlorothiazide (HYZAAR) 50-12.5 MG tablet Take 1 tablet by mouth every morning.  Refills: 2    Misc Natural Products (PROSTATE SUPPORT PO) Take 1 capsule by mouth every morning. Ingredients- saw palmetto, cranberry,pumpkin seed , palmitic acid,stearic acid, oleic acid, linoleic acid , lycopene    Multiple Vitamin (MULTIVITAMIN WITH MINERALS) TABS tablet Take 1 tablet by mouth every morning.    pomalidomide (POMALYST) 3 MG  capsule Take 1 capsule (3 mg total) by mouth daily. Take with water on days 1-21. Repeat every 28 days. Qty: 21 capsule, Refills: 0    rOPINIRole (REQUIP) 0.25 MG tablet TAKE ONE TO TWO TABLETS BY MOUTH ONCE DAILY AT BEDTIME AS NEEDED FOR  RESTLESS  LEGS Qty: 60 tablet, Refills: 1    Tamsulosin HCl (FLOMAX) 0.4 MG CAPS Take 0.4 mg by mouth daily after breakfast.     zolendronic acid 4 mg in sodium chloride 0.9 % 100 mL Inject into the vein. Is receiving treatment through [facility] next infusion due 06/20/16      STOP taking these medications     omeprazole (PRILOSEC) 40 MG capsule        Allergies  Allergen Reactions  . Celebrex [Celecoxib] Other (See Comments)    GI bleeding  . Coricidin D Cold-Flu-Sinus  [Chlorphen-Pe-Acetaminophen] Hives   Follow-up Information    Follow up with Mauri Pole, MD. Schedule an appointment as soon as possible for a visit in 2 weeks.   Specialty:  Orthopedic Surgery   Contact information:   [address] [place] [address] [phone]       The results of significant diagnostics from this hospitalization (including imaging, microbiology, ancillary and laboratory) are listed below for reference.    Significant Diagnostic Studies: Dg Chest 2 View  06/02/2016  CLINICAL DATA:  Year-old male with fall and left hip fracture per EXAM: CHEST  2 VIEW COMPARISON:  Radiograph dated 05/16/2016 and chest CT dated 11/28/2014 FINDINGS: Two views of the chest do not demonstrate a focal consolidation. There is no pleural effusion or pneumothorax. Postsurgical changes of resection of right upper anterior pleural/chest wall mass seen on the prior CT. The cardiac silhouette is within normal limits. There is degenerative changes of the spine. No acute fracture IMPRESSION: No acute cardiopulmonary process. Electronically Signed   By: Anner Crete M.D.   On: 06/02/2016 04:03   Dg Chest 2 View  05/16/2016  CLINICAL DATA:  Acute mental  status change. EXAM: CHEST  2 VIEW COMPARISON:  August 23, 2014 FINDINGS: The patient is status post partial right chest wall resection, unchanged. No pneumothorax. Haziness over the bases is consistent with an overlapping skin fold. There may be mild bibasilar atelectasis. No suspicious infiltrates are identified. A nodular density projects over the inferior right scapula, not seen previously. This nodule measures 6 mm. No other nodules or masses. No change in the heart, hila, or mediastinum. Anterior wedging of lower thoracic vertebral bodies is stable since the CT scan from December 2015. IMPRESSION: 1. 6 mm nodule projected over the right mid lung. Whether this is within the scapula, overlapping rib, or the lung is unclear on this single study. It was not seen previously however. A CT scan could further evaluate. 2. Mild opacity in the lung bases is favored to represent atelectasis. Electronically Signed   By: Dorise Bullion III  M.D   On: 05/16/2016 20:11   Ct Head Wo Contrast  05/16/2016  CLINICAL DATA:  Altered mental status, weakness EXAM: CT HEAD WITHOUT CONTRAST TECHNIQUE: Contiguous axial images were obtained from the base of the skull through the vertex without intravenous contrast. COMPARISON:  05/01/2012 FINDINGS: Brain: No evidence of acute infarction, hemorrhage, extra-axial collection, ventriculomegaly, or mass effect. Generalized cerebral atrophy. Periventricular white matter low attenuation likely secondary to microangiopathy. Vascular: Cerebrovascular atherosclerotic calcifications are noted. Skull: Negative for fracture or focal lesion. Old craniotomy defect in the right occipital region. Sinuses/Orbits: Visualized portions of the orbits are unremarkable. Visualized portions of the paranasal sinuses and mastoid air cells are unremarkable. Other: None. IMPRESSION: 1. No acute intracranial pathology. 2. Chronic microvascular disease and cerebral atrophy. Electronically Signed   By: Kathreen Devoid   On: 05/16/2016 20:49   Pelvis Portable  06/04/2016  CLINICAL DATA:  Status post left total hip today. EXAM: PORTABLE PELVIS 1-2 VIEWS COMPARISON:  None. FINDINGS: The patient is status post left hip replacement. Hardware is in good position on a single frontal view. IMPRESSION: Appropriate placement of left hip hardware on a single frontal view. Electronically Signed   By: Dorise Bullion III M.D   On: 06/04/2016 01:15   Dg Chest Port 1 View  06/02/2016  CLINICAL DATA:  82 year old male with fever. Scheduled for hip surgery tomorrow. EXAM: PORTABLE CHEST 1 VIEW COMPARISON:  Radiograph dated 06/02/2016 FINDINGS: Single portable view of the chest do not demonstrate a focal consolidation. There is no pleural effusion or pneumothorax. A linear lucency along the lateral aspect of the right lung represent skin fold artifact. Postsurgical changes of the right upper lobe with multiple anterior rib resections and surgical clips. The cardiac silhouette is within normal limits. No acute osseous pathology. IMPRESSION: No active disease. Electronically Signed   By: Anner Crete M.D.   On: 06/02/2016 20:11   Dg Hip Unilat With Pelvis 2-3 Views Left  06/02/2016  CLINICAL DATA:  82 year old male with fall and left hip pain. EXAM: DG HIP (WITH OR WITHOUT PELVIS) 2-3V LEFT COMPARISON:  None. FINDINGS: There is a transverse fracture of the left femoral neck with mild proximal migration of the femoral shaft. The head of the left femur remains in anatomic alignment with the acetabulum. No other acute fracture identified. The bones are osteopenic. Right hip arthroplasty. IMPRESSION: Transverse fracture of the left femoral neck. Electronically Signed   By: Anner Crete M.D.   On: 06/02/2016 04:01    Microbiology: Recent Results (from the past 240 hour(s))  Surgical PCR screen     Status: None   Collection Time: 06/02/16  3:00 PM  Result Value Ref Range Status   MRSA, PCR NEGATIVE NEGATIVE Final    Staphylococcus aureus NEGATIVE NEGATIVE Final    Comment:        The Xpert SA Assay (FDA approved for NASAL specimens in patients over 14 years of age), is one component of a comprehensive surveillance program.  Test performance has been validated by [facility] for patients greater than or equal to 89 year old. It is not intended to diagnose infection nor to guide or monitor treatment.   Culture, blood (Routine X 2) w Reflex to ID Panel     Status: None (Preliminary result)   Collection Time: 06/02/16  7:50 PM  Result Value Ref Range Status   Specimen Description BLOOD RIGHT ARM  Final   Special Requests BOTTLES DRAWN AEROBIC AND ANAEROBIC [place]  Final  Culture   Final    NO GROWTH 2 DAYS Performed at [facility]    Report Status PENDING  Incomplete  Culture, blood (Routine X 2) w Reflex to ID Panel     Status: None (Preliminary result)   Collection Time: 06/02/16  7:55 PM  Result Value Ref Range Status   Specimen Description BLOOD RIGHT HAND  Final   Special Requests BOTTLES DRAWN AEROBIC AND ANAEROBIC [place]  Final   Culture   Final    NO GROWTH 2 DAYS Performed at [facility]    Report Status PENDING  Incomplete     Labs: Basic Metabolic Panel:  Recent Labs Lab 06/02/16 0804 06/02/16 1503 06/03/16 0425 06/04/16 0413 06/05/16 0430  NA 124* 124* 131* 132* 132*  K 3.8 4.1 3.7 4.4 3.9  CL 88* 89* 95* 100* 104  CO2 30 28 30 26 23   GLUCOSE 125* 120* 111* 161* 133*  BUN 18 16 17  25* 27*  CREATININE 0.83 0.94 0.88 0.86 0.76  CALCIUM 9.8 9.4 9.5 8.7* 8.4*   CBC:  Recent Labs Lab 06/02/16 0325 06/02/16 0327 06/02/16 1953 06/03/16 0425 06/04/16 0413 06/05/16 0430  WBC 9.4  --  6.3 5.6 5.3 6.5  NEUTROABS 8.2*  --   --   --   --   --   HGB 10.4* 9.9* 10.0* 10.3* 8.9* 8.0*  HCT 28.2* 29.0* 27.4* 29.4* 25.4* 22.3*  MCV 92.8  --  97.9 97.0 96.9 93.7  PLT 81*  --  85* 86* 65* 66*   Signed:  Barton Dubois MD.  [facility] 06/05/2016,  3:07 PM

## 2016-06-05 NOTE — Progress Notes (Signed)
CSW assisting with d/c planning. Spoke with pt's daughter this afternoon Sandria Senter [phone]. Daughter is concerned about pt's confusion." This is not my father's baseline. I don't know why he is so confused? " NSG is aware and will ask MD to call daughter. Pt has a [facility] Rehab bed at [facility] when ready for d/c. CSW will continue to follow to assist with d/c planning needs.  Werner Lean LCSW [phone]

## 2016-06-05 NOTE — Progress Notes (Signed)
     Subjective: 2 Days Post-Op Procedure(s) (LRB): ARTHROPLASTY BIPOLAR HIP LEFT  (HEMIARTHROPLASTY) (Left)   Patient expressing minimal pain.  Patient is rather confused, this is reportedly not his baseline condition.   Objective:   VITALS:   Filed Vitals:   06/04/16 2259 06/05/16 0407  BP: 146/70 139/81  Pulse: 93 100  Temp: 98 F (36.7 C) 97.8 F (36.6 C)  Resp: 16 16    Dorsiflexion/Plantar flexion intact Incision: dressing C/D/I No cellulitis present Compartment soft  LABS  Recent Labs  06/03/16 0425 06/04/16 0413 06/05/16 0430  HGB 10.3* 8.9* 8.0*  HCT 29.4* 25.4* 22.3*  WBC 5.6 5.3 6.5  PLT 86* 65* 66*     Recent Labs  06/03/16 0425 06/04/16 0413 06/05/16 0430  NA 131* 132* 132*  K 3.7 4.4 3.9  BUN 17 25* 27*  CREATININE 0.88 0.86 0.76  GLUCOSE 111* 161* 133*     Assessment/Plan: 2 Days Post-Op Procedure(s) (LRB): ARTHROPLASTY BIPOLAR HIP LEFT  (HEMIARTHROPLASTY) (Left)  Up with therapy Discharge to SNF eventually, when ready  Ortho recommendations:  Lovenox  for anticoagulation, unless other medically indicated.  Norco for pain management (Rx written).  Zanaflex for muscle spasms (Rx written).  MiraLax and Colace for constipation  Iron 325 mg tid for 2-3 weeks   WBAT on the left leg.  Dressing to remain in place until follow in clinic in 2 weeks.  Dressing is waterproof and may shower with it in place.  Follow up in 2 weeks at [facility]. Follow up with OLIN,Chapel Silverthorn D in 2 weeks.  Contact information:  [facility] [address]   PAC  06/05/2016, 8:26 AM

## 2016-06-05 NOTE — Progress Notes (Signed)
Pt d/c to [facility]. Report given to [facility].

## 2016-06-06 ENCOUNTER — Encounter: Payer: Self-pay | Admitting: Internal Medicine

## 2016-06-06 ENCOUNTER — Non-Acute Institutional Stay (SKILLED_NURSING_FACILITY): Payer: Medicare Other | Admitting: Internal Medicine

## 2016-06-06 ENCOUNTER — Telehealth: Payer: Self-pay | Admitting: *Deleted

## 2016-06-06 DIAGNOSIS — I10 Essential (primary) hypertension: Secondary | ICD-10-CM

## 2016-06-06 DIAGNOSIS — C9000 Multiple myeloma not having achieved remission: Secondary | ICD-10-CM | POA: Diagnosis not present

## 2016-06-06 DIAGNOSIS — S72002D Fracture of unspecified part of neck of left femur, subsequent encounter for closed fracture with routine healing: Secondary | ICD-10-CM

## 2016-06-06 DIAGNOSIS — D61818 Other pancytopenia: Secondary | ICD-10-CM

## 2016-06-06 DIAGNOSIS — I4891 Unspecified atrial fibrillation: Secondary | ICD-10-CM | POA: Diagnosis not present

## 2016-06-06 NOTE — Telephone Encounter (Signed)
Called pt.'s daughter to check on pt.'s status.  Pt.'s daughter states that pt is calm and lethargic at this time.  Rip Harbour has no questions at this time and was appreciative of call.

## 2016-06-06 NOTE — Clinical Social Work Placement (Signed)
   CLINICAL SOCIAL WORK PLACEMENT  NOTE  Date:  06/06/2016  Patient Details  Name: Richard [name] MRN: [id number] Date of Birth: [birth year]  Clinical Social Work is seeking post-discharge placement for this patient at the [place] level of care (*CSW will initial, date and re-position this form in  chart as items are completed):  Yes   Patient/family provided with [facility] Work Department's list of facilities offering this level of care within the geographic area requested by the patient (or if unable, by the patient's family).  Yes   Patient/family informed of their freedom to choose among providers that offer the needed level of care, that participate in Medicare, Medicaid or managed care program needed by the patient, have an available bed and are willing to accept the patient.  Yes   Patient/family informed of [facility]'s ownership interest in [facility] and [facility], as well as of the fact that they are under no obligation to receive care at these facilities.  PASRR submitted to EDS on 06/03/16     PASRR number received on 06/03/16     Existing PASRR number confirmed on       FL2 transmitted to all facilities in geographic area requested by pt/family on 06/04/16     FL2 transmitted to all facilities within larger geographic area on       Patient informed that his/her managed care company has contracts with or will negotiate with certain facilities, including the following:        Yes   Patient/family informed of bed offers received.  Patient chooses bed at [facility]     Physician recommends and patient chooses bed at      Patient to be transferred to [facility] on 06/05/16.  Patient to be transferred to facility by [facility]     Patient family notified on 06/06/16 of transfer.  Name of family member notified:  DAUGHTER     PHYSICIAN       Additional Comment: Pt / daughter aware of d/c to  [facility] 06/05/16. [facility] transport required. Medical necessity form completed. Authorization for SNF / ambulance transport requested from [facility]. SNF placement has been approved by insurance.D/C Summary sent to SNF for review. Scripts included in d/c packet. # for report provided to nsg.   _______________________________________________ Luretha Rued, LCSW  [phone] 06/06/2016, 7:28 AM

## 2016-06-06 NOTE — Progress Notes (Signed)
Location:   [facility] Room Number: 263/F Place of Service:  SNF (31) Provider:  Jetty Duhamel, MD  Patient Care Team: Fanny Bien, MD as PCP - General (Family Medicine) Ladell Pier, MD as Consulting Physician (Oncology)  Extended Emergency Contact Information Primary Emergency Contact: Storey,Melinda Address: [address]          [place] Phone: [phone] Relation: Daughter  Code Status:  Full Code Goals of care: Advanced Directive information Advanced Directives 06/06/2016  Does patient have an advance directive? Yes  Type of Advance Directive -  Does patient want to make changes to advanced directive? No - Patient declined  Copy of advanced directive(s) in chart? Yes    Acute visit follow-up left hip repair-confusion   HPI:  Pt is a 82 y.o. male seen today for an acute visit for follow-up of left hip repair.   Patient does have a history of multiple myeloma followed by oncology as well as prostate cancer hyponatremia hypertension and A. fib.  He presented after a fall at home  He was complaining of left hip pain.  He was found to have a left displaced hip fracture he had status post hemiarthroplasty on June 26 tolerated procedure well.  He is receiving Tylenol Vicodin for pain control he is here for rehabilitation.  In regards to multiple myeloma this is a currently in relapse and followed by oncology-currently continues on  Pomalyst and Decadron.  Patient does have a history of pancytopenia with acute blood loss anemia from surgery on top of underlying chronic anemia from the multiple and although, he does continue on iron but did not require transfusion in the hospital.  He also experienced hyponatremia hypokalemia in the hospital this was thought secondary to diuretics and dehydration-on discharge sodium was 132 potassium 3.9 recommended to follow-up a metabolic panel in 5  days.  Patient also had apparently acute delirium in the hospital cu MRI could not be done because patient was not cooperative with exam.  Was started on low-dose Seroquel to assist with hospital-acquired delirium-recommendation to discontinue this when his mental status improves.  I did discuss this with his daughter at bedside and she would really like the Seroquel discontinued she thinks her fSeroquel may be actually hindering his progress --secondary to family wishes we will discontinue this and monitor   Past Medical History  Diagnosis Date  . Hypertension   . Dyslipidemia   . Arrhythmia     atrial fibrillation  . Cholelithiasis   . GERD (gastroesophageal reflux disease)   . Hiatal hernia   . Esophageal stricture   . History of BPH   . History of radiation therapy 01/29/11 -03/15/11    larynx 6600 cGy 33 sessions  . Arthritis   . Anemia     pt's daughter denies  . Multiple myeloma   . 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 Prostate cancer ([facility]) 01/23/11    Gleason 3+3=6  . Facial basal cell cancer     nose  . Cataract     bil removed  . Pneumonia    Past Surgical History  Procedure Laterality Date  . Hip surgery    . Back surgery    . Trigeminal neuralgia    . Appendectomy    . Tonsillectomy    . Right chest wall resection      of second ,third,and fourth rib  . Esophageal dilation    .  Colonoscopy    . Upper gastrointestinal endoscopy    . Hip arthroplasty Left 06/03/2016    Procedure: ARTHROPLASTY BIPOLAR HIP LEFT  (HEMIARTHROPLASTY);  Surgeon: Paralee Cancel, MD;  Location: [facility] ORS;  Service: Orthopedics;  Laterality: Left;    Allergies  Allergen Reactions  . Celebrex [Celecoxib] Other (See Comments)    GI bleeding  . Coricidin D Cold-Flu-Sinus  [Chlorphen-Pe-Acetaminophen] Hives      Medication List       This list is accurate as of: 06/06/16  3:19 PM.  Always use your most recent med list.               acetaminophen 325 MG tablet   Commonly known as:  TYLENOL  Take 2 tablets (650 mg total) by mouth every 6 (six) hours as needed for mild pain, fever or headache.     aspirin EC 81 MG tablet  Take 81 mg by mouth every morning.     B-12 2500 MCG Subl  Place 2,500 mcg under the tongue every morning.     bacitracin-polymyxin b ophthalmic ointment  Commonly known as:  POLYSPORIN  Place 1 application into both eyes 4 (four) times daily.     Cholecalciferol 5000 units Tabs  Take 5,000 Units by mouth every morning.     dexamethasone 4 MG tablet  Commonly known as:  DECADRON  Take 40 mg by mouth once a week. Take 10 tablets by mouth once weekly on Mondays     docusate sodium 100 MG capsule  Commonly known as:  COLACE  Take 1 capsule (100 mg total) by mouth 2 (two) times daily.     feeding supplement (ENSURE ENLIVE) Liqd  Take 237 mLs by mouth 2 (two) times daily between meals.     ferrous sulfate 325 (65 FE) MG tablet  Take 1 tablet (325 mg total) by mouth 3 (three) times daily after meals.     FLOMAX 0.4 MG Caps capsule  Generic drug:  tamsulosin  Take 0.4 mg by mouth daily after breakfast.     HYDROcodone-acetaminophen 5-325 MG tablet  Commonly known as:  NORCO/VICODIN  Take 1-2 tablets by mouth every 8 (eight) hours as needed for severe pain.     losartan-hydrochlorothiazide 50-12.5 MG tablet  Commonly known as:  HYZAAR  Take 1 tablet by mouth every morning.     multivitamin with minerals Tabs tablet  Take 1 tablet by mouth every morning.     pantoprazole 40 MG tablet  Commonly known as:  PROTONIX  Take 1 tablet (40 mg total) by mouth daily.     POMALYST 3 MG capsule  Generic drug:  pomalidomide  Take 3 mg by mouth daily. Take with water on days 1-21. Repeat every 28 days.     PROSTATE SUPPORT PO  Take 1 capsule by mouth every morning. Ingredients- saw palmetto, cranberry,pumpkin seed , palmitic acid,stearic acid, oleic acid, linoleic acid , lycopene     QUEtiapine 25 MG tablet  Commonly known  as:  SEROQUEL  Take 1 tablet (25 mg total) by mouth at bedtime. To be given as part of treatment for acquired delirium; stop once mentation back to baseline     rOPINIRole 0.25 MG tablet  Commonly known as:  REQUIP  Take 1-2 tablet by mouth once daily at bedtime As needed for restless legs     zolendronic acid 4 mg in sodium chloride 0.9 % 100 mL  Inject into the vein. Is receiving treatment through [facility] next  infusion due 06/20/16        Review of Systems   Essentially unattainable secondary to confusion-patient not acutely complaining of pain shortness of breath  Immunization History  Administered Date(s) Administered  . Influenza Split 10/01/2011, 09/22/2012  . Influenza,inj,Quad PF,36+ Mos 09/08/2013, 08/29/2014, 08/16/2015  . Pneumococcal Polysaccharide-23 08/09/2010   Pertinent  Health Maintenance Due  Topic Date Due  . PNA vac Low Risk Adult (2 of 2 - PCV13) 06/06/2017 (Originally 08/10/2011)  . INFLUENZA VACCINE  07/09/2016   Fall Risk  08/01/2015 12/27/2014 11/28/2014 08/29/2014 05/26/2014  Falls in the past year? No No Yes Yes Yes  Number falls in past yr: - - 1 1 2  or more  Injury with Fall? - - Yes Yes -  Risk Factor Category  - - - - High Fall Risk  Risk for fall due to : - - Other (Comment) Impaired balance/gait History of fall(s);Impaired balance/gait  Risk for fall due to (comments): - - tripped over dog - -   Functional Status Survey:    Filed Vitals:   06/06/16 1505  BP: 126/69  Pulse: 101  Temp: 98 F (36.7 C)  TempSrc: Oral  Resp: 18   There is no weight on file to calculate BMI. Physical Exam In general this is a frail elderly male in no distress resting comfortably in bed.  His skin is warm and dry.   Cranial nerves are grossly intact I do not see lateralizing findings.  Psyche is oriented to day of week however not to month or year does respond to questions but appears somewhat confused when answering although continues to be very  pleasant smiling did appear to have some deviation of his left lower leg but this was a value by therapy and is thought to be a normal postop finding Eyes pupils appear reactive to light sclerae and conjunctivae clear visual acuity appears grossly intact.  Oropharynx is clear mucous membranes moist.  Chest is clear to auscultation there is no labored breathing.  Heart is regular rate and rhythm in the 90s he has fairly minimal pedal edema pedal pulses slightly reduced bilaterally.  Abdomen is soft nontender positive bowel sounds.  Muscle skeletal surgical site is currently covered initially when flexed and extended left lower leg he complained of pain but when this was reevaluated by therapy he did not really complain of pain and appeared to move this fairly comfortably.  Otherwise strength appears preserved upper extremities is able to move right lower leg at baseline.  There was some deviation of the left lower leg on exam but this was evaluated by therapy and per discussion with them this is a normal postop finding  Neurologic is grossly intact his speech is clear he does continue with confusion but is pleasantly confused smiling cooperative  Psych-he is oriented to day of week but not to month or year appears to still have some confusion but continues to be pleasant smiling does respond to questions although somewhat short answers and somewhat disjointed response Labs reviewed:  Recent Labs  05/27/16 1329  06/03/16 0425 06/04/16 0413 06/05/16 0430  NA 124*  < > 131* 132* 132*  K 3.1*  < > 3.7 4.4 3.9  CL  --   < > 95* 100* 104  CO2 24  < > 30 26 23   GLUCOSE 125  < > 111* 161* 133*  BUN 9.8  < > 17 25* 27*  CREATININE 0.9  < > 0.88 0.86 0.76  CALCIUM 9.0  < >  9.5 8.7* 8.4*  MG 1.9  --   --   --   --   < > = values in this interval not displayed.  Recent Labs  05/16/16 1845 05/21/16 0936 05/27/16 1329  AST 26 25 23   ALT 27 28 23   ALKPHOS 64 69 67  BILITOT 1.1 1.65*  1.21*  PROT 6.1* 6.4 5.9*  ALBUMIN 3.8 3.9 3.5    Recent Labs  05/21/16 0936 05/27/16 1516 06/02/16 0325  06/03/16 0425 06/04/16 0413 06/05/16 0430  WBC 5.5 4.3 9.4  < > 5.6 5.3 6.5  NEUTROABS 4.2 3.0 8.2*  --   --   --   --   HGB 12.0* 9.7* 10.4*  < > 10.3* 8.9* 8.0*  HCT 33.8* 27.0* 28.2*  < > 29.4* 25.4* 22.3*  MCV 97.7 95.1 92.8  < > 97.0 96.9 93.7  PLT 117* 62* 81*  < > 86* 65* 66*  < > = values in this interval not displayed. Lab Results  Component Value Date   TSH 0.512 06/04/2016   No results found for: HGBA1C Lab Results  Component Value Date   CHOL  07/27/2010    82        ATP III CLASSIFICATION:  <200     mg/dL   Desirable  200-239  mg/dL   Borderline High  >=240    mg/dL   High          HDL 31* 07/27/2010   LDLCALC  07/27/2010    36        Total Cholesterol/HDL:CHD Risk Coronary Heart Disease Risk Table                     Men   Women  1/2 Average Risk   3.4   3.3  Average Risk       5.0   4.4  2 X Average Risk   9.6   7.1  3 X Average Risk  23.4   11.0        Use the calculated Patient Ratio above and the CHD Risk Table to determine the patient's CHD Risk.        ATP III CLASSIFICATION (LDL):  <100     mg/dL   Optimal  100-129  mg/dL   Near or Above                    Optimal  130-159  mg/dL   Borderline  160-189  mg/dL   High  >190     mg/dL   Very High   TRIG 75 07/27/2010   CHOLHDL 2.6 07/27/2010    Significant Diagnostic Results in last 30 days:  Dg Chest 2 View  06/02/2016  CLINICAL DATA:  Year-old male with fall and left hip fracture per EXAM: CHEST  2 VIEW COMPARISON:  Radiograph dated 05/16/2016 and chest CT dated 11/28/2014 FINDINGS: Two views of the chest do not demonstrate a focal consolidation. There is no pleural effusion or pneumothorax. Postsurgical changes of resection of right upper anterior pleural/chest wall mass seen on the prior CT. The cardiac silhouette is within normal limits. There is degenerative changes of the  spine. No acute fracture IMPRESSION: No acute cardiopulmonary process. Electronically Signed   By: Anner Crete M.D.   On: 06/02/2016 04:03   Dg Chest 2 View  05/16/2016  CLINICAL DATA:  Acute mental status change. EXAM: CHEST  2 VIEW COMPARISON:  August 23, 2014 FINDINGS: The patient is  status post partial right chest wall resection, unchanged. No pneumothorax. Haziness over the bases is consistent with an overlapping skin fold. There may be mild bibasilar atelectasis. No suspicious infiltrates are identified. A nodular density projects over the inferior right scapula, not seen previously. This nodule measures 6 mm. No other nodules or masses. No change in the heart, hila, or mediastinum. Anterior wedging of lower thoracic vertebral bodies is stable since the CT scan from December 2015. IMPRESSION: 1. 6 mm nodule projected over the right mid lung. Whether this is within the scapula, overlapping rib, or the lung is unclear on this single study. It was not seen previously however. A CT scan could further evaluate. 2. Mild opacity in the lung bases is favored to represent atelectasis. Electronically Signed   By: Dorise Bullion III M.D   On: 05/16/2016 20:11   Ct Head Wo Contrast  05/16/2016  CLINICAL DATA:  Altered mental status, weakness EXAM: CT HEAD WITHOUT CONTRAST TECHNIQUE: Contiguous axial images were obtained from the base of the skull through the vertex without intravenous contrast. COMPARISON:  05/01/2012 FINDINGS: Brain: No evidence of acute infarction, hemorrhage, extra-axial collection, ventriculomegaly, or mass effect. Generalized cerebral atrophy. Periventricular white matter low attenuation likely secondary to microangiopathy. Vascular: Cerebrovascular atherosclerotic calcifications are noted. Skull: Negative for fracture or focal lesion. Old craniotomy defect in the right occipital region. Sinuses/Orbits: Visualized portions of the orbits are unremarkable. Visualized portions of the  paranasal sinuses and mastoid air cells are unremarkable. Other: None. IMPRESSION: 1. No acute intracranial pathology. 2. Chronic microvascular disease and cerebral atrophy. Electronically Signed   By: Kathreen Devoid   On: 05/16/2016 20:49   Pelvis Portable  06/04/2016  CLINICAL DATA:  Status post left total hip today. EXAM: PORTABLE PELVIS 1-2 VIEWS COMPARISON:  None. FINDINGS: The patient is status post left hip replacement. Hardware is in good position on a single frontal view. IMPRESSION: Appropriate placement of left hip hardware on a single frontal view. Electronically Signed   By: Dorise Bullion III M.D   On: 06/04/2016 01:15   Dg Chest Port 1 View  06/02/2016  CLINICAL DATA:  82 year old male with fever. Scheduled for hip surgery tomorrow. EXAM: PORTABLE CHEST 1 VIEW COMPARISON:  Radiograph dated 06/02/2016 FINDINGS: Single portable view of the chest do not demonstrate a focal consolidation. There is no pleural effusion or pneumothorax. A linear lucency along the lateral aspect of the right lung represent skin fold artifact. Postsurgical changes of the right upper lobe with multiple anterior rib resections and surgical clips. The cardiac silhouette is within normal limits. No acute osseous pathology. IMPRESSION: No active disease. Electronically Signed   By: Anner Crete M.D.   On: 06/02/2016 20:11   Dg Hip Unilat With Pelvis 2-3 Views Left  06/02/2016  CLINICAL DATA:  82 year old male with fall and left hip pain. EXAM: DG HIP (WITH OR WITHOUT PELVIS) 2-3V LEFT COMPARISON:  None. FINDINGS: There is a transverse fracture of the left femoral neck with mild proximal migration of the femoral shaft. The head of the left femur remains in anatomic alignment with the acetabulum. No other acute fracture identified. The bones are osteopenic. Right hip arthroplasty. IMPRESSION: Transverse fracture of the left femoral neck. Electronically Signed   By: Anner Crete M.D.   On: 06/02/2016 04:01     Assessment/Plan  History of left hip fracture status post repair will need continued PT and OT again this was evaluated by therapy today and thought to be stable-he is receiving  Tylenol and Vicodin as needed for pain control.  #2 history of multiple bile 1000 and relapse he is followed by oncology continues on  Pomalyst as well as Decadron.  #3 history of pancytopenia with acute blood loss anemia from surgery complicated with multiple myeloma-continues on iron and accommodation to update a CBC and a partially 5 days will do this.  He did not require transfusion in the hospital.  #4 history of hyponatremia hypokalemia in the hospital this was thought secondary to diuretics and dehydration this has been repleted sodium was 132 on discharge potassium 3.9 recommendation to follow this up also in 5 days.  #5 history of mental status changes he still has some confusion his daughter is concerned about the Seroquel feels that this may be adding to his altered mental status at times-we will discontinue this and monitor  Of note I note patient is on aspirin  does have a history of A. fib he is not on a rate control agent but this will have to be watched pulse  100 on exam today-blood pressure appears to be stable.  In regards to anticoagulation with history of recent hip fracture and repair he does have a history of pancytopenia and apparently GI bleed in the past-will await Dr. Redgie Grayer input on this as well  [phone] note greater than 40 minutes spent assessing patient-reviewing his chart-discussing his status with nursing staff-his daughter at bedside-and with physical therapy--and coordinating plan of care-of note greater than 50% of time spent coordinating plan of care with input as noted above     Oralia Manis, [place]

## 2016-06-07 ENCOUNTER — Non-Acute Institutional Stay (SKILLED_NURSING_FACILITY): Payer: Medicare Other | Admitting: Internal Medicine

## 2016-06-07 ENCOUNTER — Encounter: Payer: Self-pay | Admitting: Internal Medicine

## 2016-06-07 DIAGNOSIS — E876 Hypokalemia: Secondary | ICD-10-CM | POA: Diagnosis not present

## 2016-06-07 DIAGNOSIS — C9002 Multiple myeloma in relapse: Secondary | ICD-10-CM

## 2016-06-07 DIAGNOSIS — D61818 Other pancytopenia: Secondary | ICD-10-CM

## 2016-06-07 DIAGNOSIS — G2581 Restless legs syndrome: Secondary | ICD-10-CM | POA: Diagnosis not present

## 2016-06-07 DIAGNOSIS — N400 Enlarged prostate without lower urinary tract symptoms: Secondary | ICD-10-CM

## 2016-06-07 DIAGNOSIS — S72002D Fracture of unspecified part of neck of left femur, subsequent encounter for closed fracture with routine healing: Secondary | ICD-10-CM | POA: Diagnosis not present

## 2016-06-07 DIAGNOSIS — I10 Essential (primary) hypertension: Secondary | ICD-10-CM

## 2016-06-07 DIAGNOSIS — E871 Hypo-osmolality and hyponatremia: Secondary | ICD-10-CM

## 2016-06-07 DIAGNOSIS — R410 Disorientation, unspecified: Secondary | ICD-10-CM | POA: Diagnosis not present

## 2016-06-07 NOTE — Progress Notes (Signed)
MRN: [id number] Name: Richard [name]  Sex: male Age: 82 y.o. DOB: May 15, 1933  [place] #:  Facility/Room: [facility] / [facility] P Level Of Care: SNF Provider: Noah Delaine. Sheppard Coil, MD Emergency Contacts: Extended Emergency Contact Information Primary Emergency Contact: Storey,Melinda Address: [place], [place] Phone: [phone] Relation: Daughter  Code Status: Full Code  Allergies: Celebrex and Coricidin d cold-flu-sinus   Chief Complaint  Patient presents with  . New Admit To SNF    Admit to Facility    HPI: Patient is 82 y.o. male with multiple myeloma followed by Dr. Benay Spice, prostate cancer, hyponatremia, HTN, HLD, and atrial fibrillation; who presents after having a fall at home yesterday evening while trying to use the restroom. Patient notes that he had tried to get up, but was unable to bear weight due to pain in the left hip. He denies any loss of consciousness or trauma to his head with the fall. Pt was admitted to [facility] fro 6/25-28 where pt underwent a L hemi arthroplasty Hospital course was complicated by delirium, hyponatremia and pancytopenia from current chemotherapy. Pt is admitted to SNF for OT/PT. While at SNF pt will be followed for HTN, tx with losartan, RLS,tx with requip and BPH, tx with flomax.  Past Medical History  Diagnosis Date  . Hypertension   . Dyslipidemia   . Arrhythmia     atrial fibrillation  . Cholelithiasis   . GERD (gastroesophageal reflux disease)   . Hiatal hernia   . Esophageal stricture   . History of BPH   . History of radiation therapy 01/29/11 -03/15/11    larynx 6600 cGy 33 sessions  . Arthritis   . Anemia     pt's daughter denies  . Multiple myeloma   . 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 Prostate cancer ([place]) 01/23/11    Gleason 3+3=6  . Facial basal cell cancer     nose  . Cataract     bil removed  . Pneumonia   . Hyponatremia   . Left displaced femoral neck  fracture ([place])   . Hordeolum externum (stye)   . Hypokalemia   . Dehydration   . Pancytopenia ([place])   . Hyperbilirubinemia   . Essential hypertension, benign   . Esophageal stricture   . Dysphagia following unspecified cerebrovascular disease   . Abnormal finding on GI tract imaging   . GERD (gastroesophageal reflux disease)   . Cholelithiasis   . Pulmonary hypertension ([place])   . Atrial fibrillation ([place])   . Murmur     Past Surgical History  Procedure Laterality Date  . Hip surgery    . Back surgery    . Trigeminal neuralgia    . Appendectomy    . Tonsillectomy    . Right chest wall resection      of second ,third,and fourth rib  . Esophageal dilation    . Colonoscopy    . Upper gastrointestinal endoscopy    . Hip arthroplasty Left 06/03/2016    Procedure: ARTHROPLASTY BIPOLAR HIP LEFT  (HEMIARTHROPLASTY);  Surgeon: Paralee Cancel, MD;  Location: [facility] ORS;  Service: Orthopedics;  Laterality: Left;      Medication List       This list is accurate as of: 06/07/16 11:59 PM.  Always use your most recent med list.  acetaminophen 325 MG tablet  Commonly known as:  TYLENOL  Take 2 tablets (650 mg total) by mouth every 6 (six) hours as needed for mild pain, fever or headache.     aspirin EC 81 MG tablet  Take 81 mg by mouth every morning.     B-12 2500 MCG Subl  Place 2,500 mcg under the tongue every morning.     bacitracin-polymyxin b ophthalmic ointment  Commonly known as:  POLYSPORIN  Place 1 application into both eyes 4 (four) times daily.     Cholecalciferol 5000 units Tabs  Take 5,000 Units by mouth every morning.     dexamethasone 4 MG tablet  Commonly known as:  DECADRON  Take 40 mg by mouth once a week. Take 10 tablets by mouth once weekly on Mondays     docusate sodium 100 MG capsule  Commonly known as:  COLACE  Take 1 capsule (100 mg total) by mouth 2 (two) times daily.     feeding supplement (ENSURE ENLIVE) Liqd  Take 237 mLs by mouth 2  (two) times daily between meals.     ferrous sulfate 325 (65 FE) MG tablet  Take 1 tablet (325 mg total) by mouth 3 (three) times daily after meals.     FLOMAX 0.4 MG Caps capsule  Generic drug:  tamsulosin  Take 0.4 mg by mouth daily after breakfast.     HYDROcodone-acetaminophen 5-325 MG tablet  Commonly known as:  NORCO/VICODIN  Take 1-2 tablets by mouth every 8 (eight) hours as needed for severe pain.     losartan-hydrochlorothiazide 50-12.5 MG tablet  Commonly known as:  HYZAAR  Take 1 tablet by mouth every morning.     multivitamin with minerals Tabs tablet  Take 1 tablet by mouth every morning.     pantoprazole 40 MG tablet  Commonly known as:  PROTONIX  Take 1 tablet (40 mg total) by mouth daily.     POMALYST 3 MG capsule  Generic drug:  pomalidomide  Take 3 mg by mouth daily. Take with water on days 1-21. Repeat every 28 days. Stop ate 06/26/16     PROSTATE SUPPORT PO  Take 1 capsule by mouth every morning. Ingredients- saw palmetto, cranberry,pumpkin seed , palmitic acid,stearic acid, oleic acid, linoleic acid , lycopene     QUEtiapine 25 MG tablet  Commonly known as:  SEROQUEL  Take 1 tablet (25 mg total) by mouth at bedtime. To be given as part of treatment for acquired delirium; stop once mentation back to baseline     rOPINIRole 0.25 MG tablet  Commonly known as:  REQUIP  Take 1-2 tablet by mouth once daily at bedtime As needed for restless legs     zolendronic acid 4 mg in sodium chloride 0.9 % 100 mL  Inject into the vein. Is receiving treatment through [facility] next infusion due 06/20/16        No orders of the defined types were placed in this encounter.    Immunization History  Administered Date(s) Administered  . Influenza Split 10/01/2011, 09/22/2012  . Influenza,inj,Quad PF,36+ Mos 09/08/2013, 08/29/2014, 08/16/2015  . Pneumococcal Polysaccharide-23 08/09/2010    Social History  Substance Use Topics  . Smoking status: Former Smoker     Types: Cigarettes    Quit date: 12/09/1993  . Smokeless tobacco: Former Systems developer     Comment: reports that he quit smoking about 22 years ago  . Alcohol Use: No    Family history is   Family History  Problem Relation Age of Onset  . Heart attack Father     age 82  . Multiple myeloma Mother   . Colon cancer Neg Hx   . Esophageal cancer Neg Hx   . Pancreatic cancer Neg Hx   . Prostate cancer Neg Hx   . Rectal cancer Neg Hx   . Stomach cancer Neg Hx   . Mental illness Sister       Review of Systems  DATA OBTAINED: from patient, nurse, wife GENERAL:  no fevers, fatigue, appetite changes SKIN: No itching, rash or wounds EYES: No eye pain, redness, discharge EARS: No earache, tinnitus, change in hearing NOSE: No congestion, drainage or bleeding  MOUTH/THROAT: No mouth or tooth pain, No sore throat RESPIRATORY: No cough, wheezing, SOB CARDIAC: No chest pain, palpitations, lower extremity edema  GI: No abdominal pain, No N/V/D or constipation, No heartburn or reflux  GU: No dysuria, frequency or urgency, or incontinence  MUSCULOSKELETAL: No unrelieved bone/joint pain NEUROLOGIC: No headache, dizziness or focal weakness PSYCHIATRIC: No c/o anxiety or sadness   Filed Vitals:   06/07/16 0953  BP: 131/78  Pulse: 76  Temp: 97.3 F (36.3 C)  Resp: 20    SpO2 Readings from Last 1 Encounters:  06/05/16 98%        Physical Exam  GENERAL APPEARANCE: Alert, conversant,  No acute distress.  SKIN: No diaphoresis rash HEAD: Normocephalic, atraumatic  EYES: Conjunctiva/lids clear. Pupils round, reactive. EOMs intact.  EARS: External exam WNL, canals clear. Hearing grossly normal.  NOSE: No deformity or discharge.  MOUTH/THROAT: Lips w/o lesions  RESPIRATORY: Breathing is even, unlabored. Lung sounds are clear   CARDIOVASCULAR: Heart RRR 3/6  S  Murmur, no rubs or gallops. No peripheral edema.   GASTROINTESTINAL: Abdomen is soft, non-tender, not distended w/ normal bowel  sounds. GENITOURINARY: Bladder non tender, not distended  MUSCULOSKELETAL: No abnormal joints or musculature NEUROLOGIC:  Cranial nerves 2-12 grossly intact. Moves all extremities  PSYCHIATRIC: Mood and affect appropriate to situation, no behavioral issues  Patient Active Problem List   Diagnosis Date Noted  . BPH (benign prostatic hypertrophy) 06/15/2016  . Restless leg syndrome 06/15/2016  . Edema 06/13/2016  . Altered mental status   . Delirium   . Hyponatremia 06/03/2016  . Left displaced femoral neck fracture ([place]) 06/02/2016  . Hip fracture ([place]) 06/02/2016  . Hordeolum externum (stye) 05/28/2016  . Hypokalemia 05/28/2016  . Dehydration 05/28/2016  . Pancytopenia ([place]) 05/28/2016  . Hyperbilirubinemia 05/28/2016  . Cancer of true vocal cord ([place])   . Multiple myeloma ([place]) 06/23/2012  . Vocal cord cancer ([place]) 12/24/2011  . Prostate cancer ([place]) 12/24/2011  . Multiple myeloma ([place]) 12/23/2011  . Essential hypertension, benign 10/10/2010  . ESOPHAGEAL STRICTURE 08/28/2010  . CHOLELITHIASIS 08/28/2010  . DYSPHAGIA UNSPECIFIED 08/28/2010  . ABNORMAL FINDINGS GI TRACT 08/28/2010  . GERD 08/23/2010  . CHOLELITHIASIS 08/23/2010  . HYPERTENSION, PULMONARY 08/10/2010  . ATRIAL FIBRILLATION 08/10/2010  . MURMUR 08/10/2010       Component Value Date/Time   WBC 6.5 06/05/2016 0430   WBC 9.4 06/02/2016   WBC 4.3 05/27/2016 1516   RBC 2.38* 06/05/2016 0430   RBC 2.84* 05/27/2016 1516   HGB 8.0* 06/05/2016 0430   HGB 9.7* 05/27/2016 1516   HCT 22.3* 06/05/2016 0430   HCT 27.0* 05/27/2016 1516   PLT 66* 06/05/2016 0430   PLT 62* 05/27/2016 1516   MCV 93.7 06/05/2016 0430   MCV 95.1 05/27/2016 1516   LYMPHSABS 0.6* 06/02/2016  0325   LYMPHSABS 0.7* 05/27/2016 1516   MONOABS 0.6 06/02/2016 0325   MONOABS 0.6 05/27/2016 1516   EOSABS 0.0 06/02/2016 0325   EOSABS 0.0 05/27/2016 1516   BASOSABS 0.0 06/02/2016 0325   BASOSABS 0.0 05/27/2016 1516        Component Value  Date/Time   NA 132* 06/05/2016 0430   NA 122* 06/02/2016   NA 124* 05/27/2016 1329   K 3.9 06/05/2016 0430   K 3.1* 05/27/2016 1329   CL 104 06/05/2016 0430   CL 102 04/26/2013 0913   CO2 23 06/05/2016 0430   CO2 24 05/27/2016 1329   GLUCOSE 133* 06/05/2016 0430   GLUCOSE 125 05/27/2016 1329   GLUCOSE 116* 04/26/2013 0913   BUN 27* 06/05/2016 0430   BUN 17 06/02/2016   BUN 9.8 05/27/2016 1329   CREATININE 0.76 06/05/2016 0430   CREATININE 0.8 06/02/2016   CREATININE 0.9 05/27/2016 1329   CALCIUM 8.4* 06/05/2016 0430   CALCIUM 9.0 05/27/2016 1329   PROT 5.9* 05/27/2016 1329   PROT 6.1* 05/16/2016 1845   ALBUMIN 3.5 05/27/2016 1329   ALBUMIN 3.8 05/16/2016 1845   AST 23 05/27/2016 1329   AST 23 05/27/2016   ALT 23 05/27/2016 1329   ALT 23 05/27/2016   ALKPHOS 67 05/27/2016 1329   ALKPHOS 67 05/27/2016   BILITOT 1.21* 05/27/2016 1329   BILITOT 1.1 05/16/2016 1845   GFRNONAA >60 06/05/2016 0430   GFRAA >60 06/05/2016 0430    No results found for: HGBA1C  Lab Results  Component Value Date   CHOL  07/27/2010    82        ATP III CLASSIFICATION:  <200     mg/dL   Desirable  200-239  mg/dL   Borderline High  >=240    mg/dL   High          HDL 31* 07/27/2010   LDLCALC  07/27/2010    36        Total Cholesterol/HDL:CHD Risk Coronary Heart Disease Risk Table                     Men   Women  1/2 Average Risk   3.4   3.3  Average Risk       5.0   4.4  2 X Average Risk   9.6   7.1  3 X Average Risk  23.4   11.0        Use the calculated Patient Ratio above and the CHD Risk Table to determine the patient's CHD Risk.        ATP III CLASSIFICATION (LDL):  <100     mg/dL   Optimal  100-129  mg/dL   Near or Above                    Optimal  130-159  mg/dL   Borderline  160-189  mg/dL   High  >190     mg/dL   Very High   TRIG 75 07/27/2010   CHOLHDL 2.6 07/27/2010     Dg Chest 2 View  06/02/2016  CLINICAL DATA:  Year-old male with fall and left hip fracture per  EXAM: CHEST  2 VIEW COMPARISON:  Radiograph dated 05/16/2016 and chest CT dated 11/28/2014 FINDINGS: Two views of the chest do not demonstrate a focal consolidation. There is no pleural effusion or pneumothorax. Postsurgical changes of resection of right upper anterior pleural/chest wall mass seen on the prior CT.  The cardiac silhouette is within normal limits. There is degenerative changes of the spine. No acute fracture IMPRESSION: No acute cardiopulmonary process. Electronically Signed   By: Anner Crete M.D.   On: 06/02/2016 04:03   Pelvis Portable  06/04/2016  CLINICAL DATA:  Status post left total hip today. EXAM: PORTABLE PELVIS 1-2 VIEWS COMPARISON:  None. FINDINGS: The patient is status post left hip replacement. Hardware is in good position on a single frontal view. IMPRESSION: Appropriate placement of left hip hardware on a single frontal view. Electronically Signed   By: Dorise Bullion III M.D   On: 06/04/2016 01:15   Dg Chest Port 1 View  06/02/2016  CLINICAL DATA:  82 year old male with fever. Scheduled for hip surgery tomorrow. EXAM: PORTABLE CHEST 1 VIEW COMPARISON:  Radiograph dated 06/02/2016 FINDINGS: Single portable view of the chest do not demonstrate a focal consolidation. There is no pleural effusion or pneumothorax. A linear lucency along the lateral aspect of the right lung represent skin fold artifact. Postsurgical changes of the right upper lobe with multiple anterior rib resections and surgical clips. The cardiac silhouette is within normal limits. No acute osseous pathology. IMPRESSION: No active disease. Electronically Signed   By: Anner Crete M.D.   On: 06/02/2016 20:11   Dg Hip Unilat With Pelvis 2-3 Views Left  06/02/2016  CLINICAL DATA:  81 year old male with fall and left hip pain. EXAM: DG HIP (WITH OR WITHOUT PELVIS) 2-3V LEFT COMPARISON:  None. FINDINGS: There is a transverse fracture of the left femoral neck with mild proximal migration of the femoral shaft.  The head of the left femur remains in anatomic alignment with the acetabulum. No other acute fracture identified. The bones are osteopenic. Right hip arthroplasty. IMPRESSION: Transverse fracture of the left femoral neck. Electronically Signed   By: Anner Crete M.D.   On: 06/02/2016 04:01    Not all labs, radiology exams or other studies done during hospitalization come through on my EPIC note; however they are reviewed by me.    Assessment and Plan-  left displaced hip fracture -s/p left hemiarthroplasty by Dr. Alvan Dame on 6/26 -procedure well tolerated  -will be follow up in 2 weeks by Dr. Alvan Dame -tylenol and vicodin for pain controlled -SNF - rehabilitation ; Pt only on ASA 81 mg daily  Multiple, Myeloma: in relapse -actively follow by Dr. 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 SNF - cont FeSO4 325 mg  And f/u CBC  -hyponatremia/hypokalemia: most likely associated with diuretics and dehydration. -essentially repleted and WNL after IVF's -Na 132 at discharge and potassium 3.9 SNF - have d/c the HCTZ in pt'd BP meds; will f/u BMP  essential HTN -stable SNF -have d/c the HCTZ and will cont losartan 50 mg daily -heart healthy diet recommended   AMS/Delirium -MRI ordered to r/o acute abnormalities, but patient non cooperative with exam (unable to remain still) -low dose seroquel to assist with hospital acquired delirium ordered -per discussion with oncologist, concerns for presumed underlying dementia  SNF - ST has detected aphasia, which wife said was present before last hospitalization;this may have been interpreted as delieirim or made worse by other conditions; I have d/c  seroquel  BPH SNF- stale;will continue flomax  RLS  SNF - stable;sont requio   Timespent > 45 min;> 50% of time with patient was spent reviewing records, labs, tests and studies, counseling and developing plan of care  Webb Silversmith D. Sheppard Coil, MD

## 2016-06-08 LAB — CULTURE, BLOOD (ROUTINE X 2)
CULTURE: NO GROWTH
Culture: NO GROWTH

## 2016-06-10 ENCOUNTER — Non-Acute Institutional Stay (SKILLED_NURSING_FACILITY): Payer: Medicare Other | Admitting: Internal Medicine

## 2016-06-10 ENCOUNTER — Encounter: Payer: Self-pay | Admitting: Internal Medicine

## 2016-06-10 DIAGNOSIS — D61818 Other pancytopenia: Secondary | ICD-10-CM | POA: Diagnosis not present

## 2016-06-10 DIAGNOSIS — E871 Hypo-osmolality and hyponatremia: Secondary | ICD-10-CM | POA: Diagnosis not present

## 2016-06-10 DIAGNOSIS — R609 Edema, unspecified: Secondary | ICD-10-CM | POA: Diagnosis not present

## 2016-06-10 LAB — CBC AND DIFFERENTIAL
HCT: 24 % — AB (ref 41–53)
HEMOGLOBIN: 8.5 g/dL — AB (ref 13.5–17.5)
PLATELETS: 100 10*3/uL — AB (ref 150–399)
WBC: 2.7 10^3/mL

## 2016-06-10 LAB — BASIC METABOLIC PANEL
BUN: 31 mg/dL — AB (ref 4–21)
CREATININE: 0.9 mg/dL (ref 0.6–1.3)
Glucose: 174 mg/dL
POTASSIUM: 4.3 mmol/L (ref 3.4–5.3)
Sodium: 133 mmol/L — AB (ref 137–147)

## 2016-06-13 ENCOUNTER — Encounter: Payer: Self-pay | Admitting: Internal Medicine

## 2016-06-13 ENCOUNTER — Non-Acute Institutional Stay (SKILLED_NURSING_FACILITY): Payer: Medicare Other | Admitting: Internal Medicine

## 2016-06-13 DIAGNOSIS — R609 Edema, unspecified: Secondary | ICD-10-CM

## 2016-06-13 DIAGNOSIS — J029 Acute pharyngitis, unspecified: Secondary | ICD-10-CM

## 2016-06-13 DIAGNOSIS — D61818 Other pancytopenia: Secondary | ICD-10-CM

## 2016-06-13 NOTE — Progress Notes (Signed)
Location:    [facility] Room Number: 884/Z Place of Service:  SNF [phone]) Provider:  Jetty Duhamel, MD  Patient Care Team: Fanny Bien, MD as PCP - General (Family Medicine) Ladell Pier, MD as Consulting Physician (Oncology)  Extended Emergency Contact Information Primary Emergency Contact: Sandria Senter Address: [address]          [place] Phone: [phone] Relation: Daughter  Code Status:  Full Code Goals of care: Advanced Directive information Advanced Directives 06/13/2016  Does patient have an advance directive? Yes  Type of Advance Directive (No Data)  Does patient want to make changes to advanced directive? No - Patient declined  Copy of advanced directive(s) in chart? Yes     Chief Complaint  Patient presents with  . Acute Visit    Edema   Follow-up edema also complaints of sore throat  HPI:  Pt is a 82 y.o. male seen today for an acute visit for follow-up of edema-patient had been on low dose hydrochlorothiazide but secondary to low sodiiumwas discontinued-patient did develop some increased edema was low  er extremities especially feet we started low-dose Lasix   We also ordered a venous Doppler rule out DVT on the left leg and this was negative.  Lab on July 3 show stability of the sodium 133 but this will have to be watched.  He is also complaining of a sore throat today and I am following up on this.  Vital signs are stable he is afebrile is mental status appears to be gradually improving he had significant confusion initially but per his daughter this is trending in a favorable direction.  He continues to be pleasant and conversant today    Past Medical History  Diagnosis Date  . Hypertension   . Dyslipidemia   . Arrhythmia     atrial fibrillation  . Cholelithiasis   . GERD (gastroesophageal reflux disease)   . Hiatal hernia   . Esophageal stricture   .  History of BPH   . History of radiation therapy 01/29/11 -03/15/11    larynx 6600 cGy 33 sessions  . Arthritis   . Anemia     pt's daughter denies  . Multiple myeloma   . 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 Prostate cancer ([place]) 01/23/11    Gleason 3+3=6  . Facial basal cell cancer     nose  . Cataract     bil removed  . Pneumonia   . Hyponatremia   . Left displaced femoral neck fracture ([place])   . Hordeolum externum (stye)   . Hypokalemia   . Dehydration   . Pancytopenia ([place])   . Hyperbilirubinemia   . Essential hypertension, benign   . Esophageal stricture   . Dysphagia following unspecified cerebrovascular disease   . Abnormal finding on GI tract imaging   . GERD (gastroesophageal reflux disease)   . Cholelithiasis   . Pulmonary hypertension ([place])   . Atrial fibrillation ([place])   . Murmur    Past Surgical History  Procedure Laterality Date  . Hip surgery    . Back surgery    . Trigeminal neuralgia    . Appendectomy    . Tonsillectomy    . Right chest wall resection      of second ,third,and fourth rib  . Esophageal dilation    . Colonoscopy    . Upper gastrointestinal endoscopy    .  Hip arthroplasty Left 06/03/2016    Procedure: ARTHROPLASTY BIPOLAR HIP LEFT  (HEMIARTHROPLASTY);  Surgeon: Paralee Cancel, MD;  Location: [facility];  Service: Orthopedics;  Laterality: Left;    Allergies  Allergen Reactions  . Celebrex [Celecoxib] Other (See Comments)    GI bleeding  . Coricidin D Cold-Flu-Sinus  [Chlorphen-Pe-Acetaminophen] Hives      Medication List       This list is accurate as of: 06/13/16 11:49 AM.  Always use your most recent med list.               acetaminophen 325 MG tablet  Commonly known as:  TYLENOL  Take 2 tablets (650 mg total) by mouth every 6 (six) hours as needed for mild pain, fever or headache.     aspirin EC 81 MG tablet  Take 81 mg by mouth every morning.     B-12 2500 MCG Subl  Place 2,500 mcg under the tongue every  morning.     bacitracin-polymyxin b ophthalmic ointment  Commonly known as:  POLYSPORIN  Place 1 application into both eyes 4 (four) times daily.     Cholecalciferol 5000 units Tabs  Take 5,000 Units by mouth every morning.     dexamethasone 4 MG tablet  Commonly known as:  DECADRON  Take 40 mg by mouth once a week. Take 10 tablets by mouth once weekly on Mondays     docusate sodium 100 MG capsule  Commonly known as:  COLACE  Take 1 capsule (100 mg total) by mouth 2 (two) times daily.     feeding supplement (ENSURE ENLIVE) Liqd  Take 237 mLs by mouth 2 (two) times daily between meals.     ferrous sulfate 325 (65 FE) MG tablet  Take 1 tablet (325 mg total) by mouth 3 (three) times daily after meals.     FLOMAX 0.4 MG Caps capsule  Generic drug:  tamsulosin  Take 0.4 mg by mouth daily after breakfast.     HYDROcodone-acetaminophen 5-325 MG tablet  Commonly known as:  NORCO/VICODIN  Take 1-2 tablets by mouth every 8 (eight) hours as needed for severe pain.     losartan-hydrochlorothiazide 50-12.5 MG tablet  Commonly known as:  HYZAAR  Take 1 tablet by mouth every morning.     multivitamin with minerals Tabs tablet  Take 1 tablet by mouth every morning.     pantoprazole 40 MG tablet  Commonly known as:  PROTONIX  Take 1 tablet (40 mg total) by mouth daily.     POMALYST 3 MG capsule  Generic drug:  pomalidomide  Take 3 mg by mouth daily. Take with water on days 1-21. Repeat every 28 days. Stop ate 06/26/16     QUEtiapine 25 MG tablet  Commonly known as:  SEROQUEL  Take 1 tablet (25 mg total) by mouth at bedtime. To be given as part of treatment for acquired delirium; stop once mentation back to baseline     rOPINIRole 0.25 MG tablet  Commonly known as:  REQUIP  Take 1-2 tablet by mouth once daily at bedtime As needed for restless legs     zolendronic acid 4 mg in sodium chloride 0.9 % 100 mL  Inject into the vein. Is receiving treatment through [facility] next  infusion due 06/20/16      Of note the hydrochlorothiazide portion of the Hyzaar has been discontinued is now on  Lasix  Review of Systems   In general does not complaining any fever or chills appears  to be getting a bit stronger.  Skin does not complain of rashes or itching.  Head ears eyes nose mouth and throat plaints of visual changes is complaining of a sore throat however.  Respiratory does not complaining of shortness breath or cough.  Cardiac no chest pain continues to have some mild lower extremity edema this appears to be gradually improving however.  GI is not complaining of nausea vomiting diarrhea constipation or abdominal discomfort.  Muscle skeletal is not complaining currently of joint pain does have a history of left hip fracture with repair is working with therapy.  Neurologic not complaining of dizziness headache or numbness.  Psych-had some initial confusion but this appears to be gradually resolving.    Immunization History  Administered Date(s) Administered  . Influenza Split 10/01/2011, 09/22/2012  . Influenza,inj,Quad PF,36+ Mos 09/08/2013, 08/29/2014, 08/16/2015  . Pneumococcal Polysaccharide-23 08/09/2010   Pertinent  Health Maintenance Due  Topic Date Due  . PNA vac Low Risk Adult (2 of 2 - PCV13) 06/06/2017 (Originally 08/10/2011)  . INFLUENZA VACCINE  07/09/2016   Fall Risk  08/01/2015 12/27/2014 11/28/2014 08/29/2014 05/26/2014  Falls in the past year? No No Yes Yes Yes  Number falls in past yr: - - 1 1 2  or more  Injury with Fall? - - Yes Yes -  Risk Factor Category  - - - - High Fall Risk  Risk for fall due to : - - Other (Comment) Impaired balance/gait History of fall(s);Impaired balance/gait  Risk for fall due to (comments): - - tripped over dog - -   Functional Status Survey:    Filed Vitals:   06/13/16 1137  BP: 123/66  Pulse: 91  Temp: 97.4 F (36.3 C)  TempSrc: Oral  Resp: 18  Height: 5' 8"  (1.727 m)  Weight: 176 lb 6.4 oz  (80.015 kg)   Body mass index is 26.83 kg/(m^2). Physical Exam   In general this is a pleasant elderly male in no distress sitting comfortably in his wheelchair.  His skin is warm and dry  Oropharynx mucous members are moist appears to have some slight whitish residue although this appears to be more food rather than true  exudate-some slightly increased erythema of the throat area  Chest is clear to auscultation there is no labored breathing.  Heart is regular rate and rhythm without murmur gallop or rub he continues to have some edema more so on the left versus the right although this some appears to be improved from exam earlier.  Abdomen is soft nontender positive bowel sounds.  Muscle skeletal is able to move all extremities 4 again limited on the left lower extremity secondary to recent hip fracture with repair.  Neurologic cranial nerves are grossly intact to speech is clear no lateralizing findings.  Psychological-he is oriented to self is pleasant conversant appears less confused than when I initially saw him.   Labs reviewed:  06/10/2016.  WBC 2.7 hemoglobin 8.5 platelets 100,000.  Sodium 133 potassium 4.3 BUN 31 creatinine 0.87.    Recent Labs  05/27/16 1329  06/03/16 0425 06/04/16 0413 06/05/16 0430  NA 124*  < > 131* 132* 132*  K 3.1*  < > 3.7 4.4 3.9  CL  --   < > 95* 100* 104  CO2 24  < > 30 26 23   GLUCOSE 125  < > 111* 161* 133*  BUN 9.8  < > 17 25* 27*  CREATININE 0.9  < > 0.88 0.86 0.76  CALCIUM 9.0  < >  9.5 8.7* 8.4*  MG 1.9  --   --   --   --   < > = values in this interval not displayed.  Recent Labs  05/16/16 1845 05/21/16 0936 05/27/16 05/27/16 1329  AST 26 25 23 23   ALT 27 28 23 23   ALKPHOS 64 69 67 67  BILITOT 1.1 1.65*  --  1.21*  PROT 6.1* 6.4  --  5.9*  ALBUMIN 3.8 3.9  --  3.5    Recent Labs  05/21/16 0936  05/27/16 1516  06/02/16 0325  06/03/16 0425 06/04/16 0413 06/05/16 0430  WBC 5.5  < > 4.3  < > 9.4  < > 5.6  5.3 6.5  NEUTROABS 4.2  --  3.0  --  8.2*  --   --   --   --   HGB 12.0*  < > 9.7*  < > 10.4*  < > 10.3* 8.9* 8.0*  HCT 33.8*  < > 27.0*  < > 28.2*  < > 29.4* 25.4* 22.3*  MCV 97.7  --  95.1  --  92.8  < > 97.0 96.9 93.7  PLT 117*  < > 62*  < > 81*  < > 86* 65* 66*  < > = values in this interval not displayed. Lab Results  Component Value Date   TSH 0.512 06/04/2016   No results found for: HGBA1C Lab Results  Component Value Date   CHOL  07/27/2010    82        ATP III CLASSIFICATION:  <200     mg/dL   Desirable  200-239  mg/dL   Borderline High  >=240    mg/dL   High          HDL 31* 07/27/2010   LDLCALC  07/27/2010    36        Total Cholesterol/HDL:CHD Risk Coronary Heart Disease Risk Table                     Men   Women  1/2 Average Risk   3.4   3.3  Average Risk       5.0   4.4  2 X Average Risk   9.6   7.1  3 X Average Risk  23.4   11.0        Use the calculated Patient Ratio above and the CHD Risk Table to determine the patient's CHD Risk.        ATP III CLASSIFICATION (LDL):  <100     mg/dL   Optimal  100-129  mg/dL   Near or Above                    Optimal  130-159  mg/dL   Borderline  160-189  mg/dL   High  >190     mg/dL   Very High   TRIG 75 07/27/2010   CHOLHDL 2.6 07/27/2010    Significant Diagnostic Results in last 30 days:  Dg Chest 2 View  06/02/2016  CLINICAL DATA:  Year-old male with fall and left hip fracture per EXAM: CHEST  2 VIEW COMPARISON:  Radiograph dated 05/16/2016 and chest CT dated 11/28/2014 FINDINGS: Two views of the chest do not demonstrate a focal consolidation. There is no pleural effusion or pneumothorax. Postsurgical changes of resection of right upper anterior pleural/chest wall mass seen on the prior CT. The cardiac silhouette is within normal limits. There is degenerative changes of the spine. No acute  fracture IMPRESSION: No acute cardiopulmonary process. Electronically Signed   By: Anner Crete M.D.   On: 06/02/2016  04:03   Dg Chest 2 View  05/16/2016  CLINICAL DATA:  Acute mental status change. EXAM: CHEST  2 VIEW COMPARISON:  August 23, 2014 FINDINGS: The patient is status post partial right chest wall resection, unchanged. No pneumothorax. Haziness over the bases is consistent with an overlapping skin fold. There may be mild bibasilar atelectasis. No suspicious infiltrates are identified. A nodular density projects over the inferior right scapula, not seen previously. This nodule measures 6 mm. No other nodules or masses. No change in the heart, hila, or mediastinum. Anterior wedging of lower thoracic vertebral bodies is stable since the CT scan from December 2015. IMPRESSION: 1. 6 mm nodule projected over the right mid lung. Whether this is within the scapula, overlapping rib, or the lung is unclear on this single study. It was not seen previously however. A CT scan could further evaluate. 2. Mild opacity in the lung bases is favored to represent atelectasis. Electronically Signed   By: Dorise Bullion III M.D   On: 05/16/2016 20:11   Ct Head Wo Contrast  05/16/2016  CLINICAL DATA:  Altered mental status, weakness EXAM: CT HEAD WITHOUT CONTRAST TECHNIQUE: Contiguous axial images were obtained from the base of the skull through the vertex without intravenous contrast. COMPARISON:  05/01/2012 FINDINGS: Brain: No evidence of acute infarction, hemorrhage, extra-axial collection, ventriculomegaly, or mass effect. Generalized cerebral atrophy. Periventricular white matter low attenuation likely secondary to microangiopathy. Vascular: Cerebrovascular atherosclerotic calcifications are noted. Skull: Negative for fracture or focal lesion. Old craniotomy defect in the right occipital region. Sinuses/Orbits: Visualized portions of the orbits are unremarkable. Visualized portions of the paranasal sinuses and mastoid air cells are unremarkable. Other: None. IMPRESSION: 1. No acute intracranial pathology. 2. Chronic microvascular  disease and cerebral atrophy. Electronically Signed   By: Kathreen Devoid   On: 05/16/2016 20:49   Pelvis Portable  06/04/2016  CLINICAL DATA:  Status post left total hip today. EXAM: PORTABLE PELVIS 1-2 VIEWS COMPARISON:  None. FINDINGS: The patient is status post left hip replacement. Hardware is in good position on a single frontal view. IMPRESSION: Appropriate placement of left hip hardware on a single frontal view. Electronically Signed   By: Dorise Bullion III M.D   On: 06/04/2016 01:15   Dg Chest Port 1 View  06/02/2016  CLINICAL DATA:  82 year old male with fever. Scheduled for hip surgery tomorrow. EXAM: PORTABLE CHEST 1 VIEW COMPARISON:  Radiograph dated 06/02/2016 FINDINGS: Single portable view of the chest do not demonstrate a focal consolidation. There is no pleural effusion or pneumothorax. A linear lucency along the lateral aspect of the right lung represent skin fold artifact. Postsurgical changes of the right upper lobe with multiple anterior rib resections and surgical clips. The cardiac silhouette is within normal limits. No acute osseous pathology. IMPRESSION: No active disease. Electronically Signed   By: Anner Crete M.D.   On: 06/02/2016 20:11   Dg Hip Unilat With Pelvis 2-3 Views Left  06/02/2016  CLINICAL DATA:  82 year old male with fall and left hip pain. EXAM: DG HIP (WITH OR WITHOUT PELVIS) 2-3V LEFT COMPARISON:  None. FINDINGS: There is a transverse fracture of the left femoral neck with mild proximal migration of the femoral shaft. The head of the left femur remains in anatomic alignment with the acetabulum. No other acute fracture identified. The bones are osteopenic. Right hip arthroplasty. IMPRESSION: Transverse fracture of the  left femoral neck. Electronically Signed   By: Anner Crete M.D.   On: 06/02/2016 04:01    Assessment/Plan  #1 lower extremity edema this appears to be gradually improving on the Lasix low dose-continue to monitor-I did do a Doppler of his  left leg which was negative for DVT x-ray also did not show any acute changes there was some concern about deviation of the left leg when I initially saw him but this appears to be stabilized in therapy did assess this as well.  #2 history of pharyngitis-patient is at risk with history of low WBC count with history of multiple myeloma and pancytopenia-will treat aggressively with amoxicillin 500 mg twice a day for 7 days and if possible try to obtain a strep test although this may be difficult in the facility-again with patient's somewhat compromised white count will be aggressive..  [place], [place], [place]

## 2016-06-13 NOTE — Progress Notes (Signed)
Patient ID: Richard [name], male   DOB: 1933/09/05, 82 y.o.   MRN: [id number]  Location:   [facility] Room Number: 782/U Place of Service:  SNF (31) Provider:  Jetty Duhamel, MD  Patient Care Team: Fanny Bien, MD as PCP - General (Family Medicine) Ladell Pier, MD as Consulting Physician (Oncology)  Extended Emergency Contact Information Primary Emergency Contact: Sandria Senter Address: [address]          [place] Phone: [phone] Relation: Daughter  Code Status:  Full Code Goals of care: Advanced Directive information Advanced Directives 06/13/2016  Does patient have an advance directive? Yes  Type of Advance Directive (No Data)  Does patient want to make changes to advanced directive? No - Patient declined  Copy of advanced directive(s) in chart? Yes    Acute visit Secondary to lower extremity edema   HPI:  Pt is a 82 y.o. male seen today for increased pedal edema    Patient  is here for rehabilitation after sustaining a left hip fracture   Patient does have a history of multiple myeloma followed by oncology as well as prostate cancer hyponatremia hyp  In regards to multiple myeloma this is a currently in relapse and followed by oncology-currently continues on  Pomalyst and Decadron.  Patient does have a history of pancytopenia with acute blood loss anemia from surgery on top of underlying chronic anemia from the multiple and although, he does continue on iron but did not require transfusion in the hospital.  He also experienced hyponatremia hypokalemia in the hospital this was thought secondary to diuretics and dehydration-on discharge sodium was 132 potassium 3.9 recommended to follow-up a metabolic panel which is pending.  Patient has developed some lower extremity edema-he had been on a low-dose diuretic but this was discontinued secondary to concerns of low sodium.  .  Currently does  not complain of any shortness breath or chest pain-initially had some confusion but this appears to be improving as well during his time in the facility.         Past Medical History  Diagnosis Date  . Hypertension   . Dyslipidemia   . Arrhythmia     atrial fibrillation  . Cholelithiasis   . GERD (gastroesophageal reflux disease)   . Hiatal hernia   . Esophageal stricture   . History of BPH   . History of radiation therapy 01/29/11 -03/15/11    larynx 6600 cGy 33 sessions  . Arthritis   . Anemia     pt's daughter denies  . Multiple myeloma   . 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 Prostate cancer ([place]) 01/23/11    Gleason 3+3=6  . Facial basal cell cancer     nose  . Cataract     bil removed  . Pneumonia   . Hyponatremia   . Left displaced femoral neck fracture ([place])   . Hordeolum externum (stye)   . Hypokalemia   . Dehydration   . Pancytopenia ([place])   . Hyperbilirubinemia   . Essential hypertension, benign   . Esophageal stricture   . Dysphagia following unspecified cerebrovascular disease   . Abnormal finding on GI tract imaging   . GERD (gastroesophageal reflux disease)   . Cholelithiasis   . Pulmonary hypertension ([place])   . Atrial fibrillation ([place])   . Murmur    Past Surgical History  Procedure Laterality Date  . Hip  surgery    . Back surgery    . Trigeminal neuralgia    . Appendectomy    . Tonsillectomy    . Right chest wall resection      of second ,third,and fourth rib  . Esophageal dilation    . Colonoscopy    . Upper gastrointestinal endoscopy    . Hip arthroplasty Left 06/03/2016    Procedure: ARTHROPLASTY BIPOLAR HIP LEFT  (HEMIARTHROPLASTY);  Surgeon: Paralee Cancel, MD;  Location: [facility];  Service: Orthopedics;  Laterality: Left;    Allergies  Allergen Reactions  . Celebrex [Celecoxib] Other (See Comments)    GI bleeding  . Coricidin D Cold-Flu-Sinus  [Chlorphen-Pe-Acetaminophen] Hives      Medication List       This  list is accurate as of: 06/10/16 11:59 PM.  Always use your most recent med list.               acetaminophen 325 MG tablet  Commonly known as:  TYLENOL  Take 2 tablets (650 mg total) by mouth every 6 (six) hours as needed for mild pain, fever or headache.     aspirin EC 81 MG tablet  Take 81 mg by mouth every morning.     B-12 2500 MCG Subl  Place 2,500 mcg under the tongue every morning.     bacitracin-polymyxin b ophthalmic ointment  Commonly known as:  POLYSPORIN  Place 1 application into both eyes 4 (four) times daily.     Cholecalciferol 5000 units Tabs  Take 5,000 Units by mouth every morning.     dexamethasone 4 MG tablet  Commonly known as:  DECADRON  Take 40 mg by mouth once a week. Take 10 tablets by mouth once weekly on Mondays     docusate sodium 100 MG capsule  Commonly known as:  COLACE  Take 1 capsule (100 mg total) by mouth 2 (two) times daily.     feeding supplement (ENSURE ENLIVE) Liqd  Take 237 mLs by mouth 2 (two) times daily between meals.     ferrous sulfate 325 (65 FE) MG tablet  Take 1 tablet (325 mg total) by mouth 3 (three) times daily after meals.     FLOMAX 0.4 MG Caps capsule  Generic drug:  tamsulosin  Take 0.4 mg by mouth daily after breakfast.     HYDROcodone-acetaminophen 5-325 MG tablet  Commonly known as:  NORCO/VICODIN  Take 1-2 tablets by mouth every 8 (eight) hours as needed for severe pain.     losartan-hydrochlorothiazide 50-12.5 MG tablet  Commonly known as:  HYZAAR  Take 1 tablet by mouth every morning.     multivitamin with minerals Tabs tablet  Take 1 tablet by mouth every morning.     pantoprazole 40 MG tablet  Commonly known as:  PROTONIX  Take 1 tablet (40 mg total) by mouth daily.     POMALYST 3 MG capsule  Generic drug:  pomalidomide  Take 3 mg by mouth daily. Take with water on days 1-21. Repeat every 28 days. Stop ate 06/26/16     PROSTATE SUPPORT PO  Take 1 capsule by mouth every morning. Ingredients- saw  palmetto, cranberry,pumpkin seed , palmitic acid,stearic acid, oleic acid, linoleic acid , lycopene     QUEtiapine 25 MG tablet  Commonly known as:  SEROQUEL  Take 1 tablet (25 mg total) by mouth at bedtime. To be given as part of treatment for acquired delirium; stop once mentation back to baseline     rOPINIRole  0.25 MG tablet  Commonly known as:  REQUIP  Take 1-2 tablet by mouth once daily at bedtime As needed for restless legs     zolendronic acid 4 mg in sodium chloride 0.9 % 100 mL  Inject into the vein. Is receiving treatment through [facility] next infusion due 06/20/16      Note the hydrochlorothiazide portion of the Hyzaar has been discontinued previously again secondary to concerns of low sodium   Review of Systems   Provided by patient as well as daughter.  In general no complaints of fever or chills.  Skin does not complain of rashes or itching.  Head ears eyes nose mouth throat is not complaining of sore throat or visual changes.  Respiratory is not complaining of shortness breath or cough.  Cardiac no chest pain again does appear to have some mild lower extremity edema mainly pedal edema.  GI does not complain nausea vomiting diarrhea or constipation or abdominal pain.  Musculoskeletal is not complaining of joint pain does have a history of left hip fracture with repair appears to be relatively well with this.  Neurologic is not complaining of dizziness headache or numbness.  Insight had initial confusion mental status changes this appears to be gradually improving per daughter   Immunization History  Administered Date(s) Administered  . Influenza Split 10/01/2011, 09/22/2012  . Influenza,inj,Quad PF,36+ Mos 09/08/2013, 08/29/2014, 08/16/2015  . Pneumococcal Polysaccharide-23 08/09/2010   Pertinent  Health Maintenance Due  Topic Date Due  . PNA vac Low Risk Adult (2 of 2 - PCV13) 06/06/2017 (Originally 08/10/2011)  . INFLUENZA VACCINE  07/09/2016   Fall  Risk  08/01/2015 12/27/2014 11/28/2014 08/29/2014 05/26/2014  Falls in the past year? No No Yes Yes Yes  Number falls in past yr: - - 1 1 2  or more  Injury with Fall? - - Yes Yes -  Risk Factor Category  - - - - High Fall Risk  Risk for fall due to : - - Other (Comment) Impaired balance/gait History of fall(s);Impaired balance/gait  Risk for fall due to (comments): - - tripped over dog - -   Functional Status Survey:    Filed Vitals:   06/10/16 1057  BP: 120/65  Pulse: 78  Temp: 97.3 F (36.3 C)  TempSrc: Oral  Resp: 16  Height: 5' 8"  (1.727 m)  Weight: 176 lb 4.8 oz (79.969 kg)   Body mass index is 26.81 kg/(m^2). Physical Exam In general this is a frail elderly male in no distress   His skin is warm and dry.  Oropharynx is clear mucous membranes moist.  Chest is clear to auscultation there is no labored breathing.  Heart is regular rate and rhythm with occasional irregular beats that she has trace lower extremity edema this is somewhat increased on the left versus the right with reduced pedal pulse on the left there is no erythema or acute tenderness to palpation or significant warmth.  Abdomen is soft nontender positive bowel sounds.  Musculoskeletal is able to move all extremities 4 again limited on the left lower extremities secondary to recent left hip repair upper extremity strength appears to be intact.  Neurologic as noted above cranial nerves are intact to speech is clear  Psych he appears to be more conversant appropriate in his responses which is an improvement from previous exam.          Labs reviewed:  Recent Labs  05/27/16 1329  06/03/16 0425 06/04/16 0413 06/05/16 0430  NA 124*  < >  131* 132* 132*  K 3.1*  < > 3.7 4.4 3.9  CL  --   < > 95* 100* 104  CO2 24  < > 30 26 23   GLUCOSE 125  < > 111* 161* 133*  BUN 9.8  < > 17 25* 27*  CREATININE 0.9  < > 0.88 0.86 0.76  CALCIUM 9.0  < > 9.5 8.7* 8.4*  MG 1.9  --   --   --   --   < > = values  in this interval not displayed.  Recent Labs  05/16/16 1845 05/21/16 0936 05/27/16 05/27/16 1329  AST 26 25 23 23   ALT 27 28 23 23   ALKPHOS 64 69 67 67  BILITOT 1.1 1.65*  --  1.21*  PROT 6.1* 6.4  --  5.9*  ALBUMIN 3.8 3.9  --  3.5    Recent Labs  05/21/16 0936  05/27/16 1516  06/02/16 0325  06/03/16 0425 06/04/16 0413 06/05/16 0430  WBC 5.5  < > 4.3  < > 9.4  < > 5.6 5.3 6.5  NEUTROABS 4.2  --  3.0  --  8.2*  --   --   --   --   HGB 12.0*  < > 9.7*  < > 10.4*  < > 10.3* 8.9* 8.0*  HCT 33.8*  < > 27.0*  < > 28.2*  < > 29.4* 25.4* 22.3*  MCV 97.7  --  95.1  --  92.8  < > 97.0 96.9 93.7  PLT 117*  < > 62*  < > 81*  < > 86* 65* 66*  < > = values in this interval not displayed. Lab Results  Component Value Date   TSH 0.512 06/04/2016   No results found for: HGBA1C Lab Results  Component Value Date   CHOL  07/27/2010    82        ATP III CLASSIFICATION:  <200     mg/dL   Desirable  200-239  mg/dL   Borderline High  >=240    mg/dL   High          HDL 31* 07/27/2010   LDLCALC  07/27/2010    36        Total Cholesterol/HDL:CHD Risk Coronary Heart Disease Risk Table                     Men   Women  1/2 Average Risk   3.4   3.3  Average Risk       5.0   4.4  2 X Average Risk   9.6   7.1  3 X Average Risk  23.4   11.0        Use the calculated Patient Ratio above and the CHD Risk Table to determine the patient's CHD Risk.        ATP III CLASSIFICATION (LDL):  <100     mg/dL   Optimal  100-129  mg/dL   Near or Above                    Optimal  130-159  mg/dL   Borderline  160-189  mg/dL   High  >190     mg/dL   Very High   TRIG 75 07/27/2010   CHOLHDL 2.6 07/27/2010    Significant Diagnostic Results in last 30 days:  Dg Chest 2 View  06/02/2016  CLINICAL DATA:  Year-old male with fall and left hip fracture per EXAM:  CHEST  2 VIEW COMPARISON:  Radiograph dated 05/16/2016 and chest CT dated 11/28/2014 FINDINGS: Two views of the chest do not demonstrate a  focal consolidation. There is no pleural effusion or pneumothorax. Postsurgical changes of resection of right upper anterior pleural/chest wall mass seen on the prior CT. The cardiac silhouette is within normal limits. There is degenerative changes of the spine. No acute fracture IMPRESSION: No acute cardiopulmonary process. Electronically Signed   By: Anner Crete M.D.   On: 06/02/2016 04:03   Dg Chest 2 View  05/16/2016  CLINICAL DATA:  Acute mental status change. EXAM: CHEST  2 VIEW COMPARISON:  August 23, 2014 FINDINGS: The patient is status post partial right chest wall resection, unchanged. No pneumothorax. Haziness over the bases is consistent with an overlapping skin fold. There may be mild bibasilar atelectasis. No suspicious infiltrates are identified. A nodular density projects over the inferior right scapula, not seen previously. This nodule measures 6 mm. No other nodules or masses. No change in the heart, hila, or mediastinum. Anterior wedging of lower thoracic vertebral bodies is stable since the CT scan from December 2015. IMPRESSION: 1. 6 mm nodule projected over the right mid lung. Whether this is within the scapula, overlapping rib, or the lung is unclear on this single study. It was not seen previously however. A CT scan could further evaluate. 2. Mild opacity in the lung bases is favored to represent atelectasis. Electronically Signed   By: Dorise Bullion III M.D   On: 05/16/2016 20:11   Ct Head Wo Contrast  05/16/2016  CLINICAL DATA:  Altered mental status, weakness EXAM: CT HEAD WITHOUT CONTRAST TECHNIQUE: Contiguous axial images were obtained from the base of the skull through the vertex without intravenous contrast. COMPARISON:  05/01/2012 FINDINGS: Brain: No evidence of acute infarction, hemorrhage, extra-axial collection, ventriculomegaly, or mass effect. Generalized cerebral atrophy. Periventricular white matter low attenuation likely secondary to microangiopathy. Vascular:  Cerebrovascular atherosclerotic calcifications are noted. Skull: Negative for fracture or focal lesion. Old craniotomy defect in the right occipital region. Sinuses/Orbits: Visualized portions of the orbits are unremarkable. Visualized portions of the paranasal sinuses and mastoid air cells are unremarkable. Other: None. IMPRESSION: 1. No acute intracranial pathology. 2. Chronic microvascular disease and cerebral atrophy. Electronically Signed   By: Kathreen Devoid   On: 05/16/2016 20:49   Pelvis Portable  06/04/2016  CLINICAL DATA:  Status post left total hip today. EXAM: PORTABLE PELVIS 1-2 VIEWS COMPARISON:  None. FINDINGS: The patient is status post left hip replacement. Hardware is in good position on a single frontal view. IMPRESSION: Appropriate placement of left hip hardware on a single frontal view. Electronically Signed   By: Dorise Bullion III M.D   On: 06/04/2016 01:15   Dg Chest Port 1 View  06/02/2016  CLINICAL DATA:  82 year old male with fever. Scheduled for hip surgery tomorrow. EXAM: PORTABLE CHEST 1 VIEW COMPARISON:  Radiograph dated 06/02/2016 FINDINGS: Single portable view of the chest do not demonstrate a focal consolidation. There is no pleural effusion or pneumothorax. A linear lucency along the lateral aspect of the right lung represent skin fold artifact. Postsurgical changes of the right upper lobe with multiple anterior rib resections and surgical clips. The cardiac silhouette is within normal limits. No acute osseous pathology. IMPRESSION: No active disease. Electronically Signed   By: Anner Crete M.D.   On: 06/02/2016 20:11   Dg Hip Unilat With Pelvis 2-3 Views Left  06/02/2016  CLINICAL DATA:  82 year old male with fall  and left hip pain. EXAM: DG HIP (WITH OR WITHOUT PELVIS) 2-3V LEFT COMPARISON:  None. FINDINGS: There is a transverse fracture of the left femoral neck with mild proximal migration of the femoral shaft. The head of the left femur remains in anatomic  alignment with the acetabulum. No other acute fracture identified. The bones are osteopenic. Right hip arthroplasty. IMPRESSION: Transverse fracture of the left femoral neck. Electronically Signed   By: Anner Crete M.D.   On: 06/02/2016 04:01    Assessment/Plan  #1 history of increased lower extremity edema mainly in the feet a bit more in the left versus the right-we will cautiously start Lasix 20 mg every other day with potassium 10 mEq on days he receives potassium-a CBC BMP is pending also will update this lab work next week to keep an eye on the sodium especially as well as renal function.  Secondary to somewhat increased on the left versus the right edema will order a venous Doppler as well to rule out DVT .  #3 history of pancytopenia with acute blood loss anemia from surgery complicated with multiple myeloma-continues on iron andUpdate CBC is pending platelets of 66 labs on the lab June 28 appeared to be stable   He did not require transfusion in the hospital.  #4 history of hyponatremia hypokalemia in the hospital this was thought secondary to diuretics and dehydration this has been repleted sodium was 132 on discharge potassium 3.9 follow-up lab is pending   #5 history of mental status changes This appears to be improving as noted above.  [place], PA-C [phone]

## 2016-06-13 NOTE — Progress Notes (Signed)
Patient ID: Richard [name], male   DOB: 28-Jun-1933, 82 y.o.   MRN: [id number]   Of note the correct billing codefor this visit should be [id number]

## 2016-06-14 ENCOUNTER — Telehealth: Payer: Self-pay | Admitting: Oncology

## 2016-06-14 ENCOUNTER — Telehealth: Payer: Self-pay | Admitting: *Deleted

## 2016-06-14 LAB — CBC AND DIFFERENTIAL
HEMATOCRIT: 24 % — AB (ref 41–53)
HEMOGLOBIN: 8.1 g/dL — AB (ref 13.5–17.5)
Platelets: 116 10*3/uL — AB (ref 150–399)
WBC: 2.6 10^3/mL

## 2016-06-14 LAB — BASIC METABOLIC PANEL
BUN: 24 mg/dL — AB (ref 4–21)
Creatinine: 0.8 mg/dL (ref 0.6–1.3)
Glucose: 125 mg/dL
POTASSIUM: 3.6 mmol/L (ref 3.4–5.3)
SODIUM: 132 mmol/L — AB (ref 137–147)

## 2016-06-14 NOTE — Telephone Encounter (Signed)
Called pt's daughter, she reports patient is doing OK. She is a bit frustrated with the facility for limiting his mobility. He is restricted to a wheelchair, has not been given a walker yet. He is participating in PT and transferring OK, she is able to bring him in for office visit. Melinda requests early AM visit on 7/11 due to other appts that day. 0830 appt given with MD. Lab appt will be after office visit due to staff meetings that morning. Rip Harbour voiced understanding.

## 2016-06-14 NOTE — Telephone Encounter (Signed)
Added patient for fu/lab 7/11 @ 8:30 am. Lab scheduled after visit due to lab meeting. Per pof date/time per dtr due to other appointments that day and dtr aware lab will be after visit.

## 2016-06-15 DIAGNOSIS — G2581 Restless legs syndrome: Secondary | ICD-10-CM | POA: Insufficient documentation

## 2016-06-15 DIAGNOSIS — N400 Enlarged prostate without lower urinary tract symptoms: Secondary | ICD-10-CM | POA: Insufficient documentation

## 2016-06-17 ENCOUNTER — Telehealth: Payer: Self-pay | Admitting: *Deleted

## 2016-06-17 NOTE — Telephone Encounter (Signed)
TC from patient's daughter . Patient has appt with Dr. Benay Spice on Tuesday @ 8:30 am. Daughter states she believes her father is depressed and would like Dr. Benay Spice to order a low dose anti-depressant.  She does not, however, want this discussed with her father at the appointment tomorrow. She states that her father has 'old school' beliefs about depression and thinks depressed people are weak and that she doesn't really want him to know he would be taking medication for depression.

## 2016-06-18 ENCOUNTER — Telehealth: Payer: Self-pay | Admitting: *Deleted

## 2016-06-18 ENCOUNTER — Telehealth: Payer: Self-pay | Admitting: Oncology

## 2016-06-18 ENCOUNTER — Ambulatory Visit (HOSPITAL_BASED_OUTPATIENT_CLINIC_OR_DEPARTMENT_OTHER): Payer: Medicare Other | Admitting: Oncology

## 2016-06-18 ENCOUNTER — Other Ambulatory Visit (HOSPITAL_BASED_OUTPATIENT_CLINIC_OR_DEPARTMENT_OTHER): Payer: Medicare Other

## 2016-06-18 ENCOUNTER — Ambulatory Visit: Payer: Medicare Other

## 2016-06-18 VITALS — BP 132/63 | HR 57 | Temp 99.4°F | Resp 18 | Ht 68.0 in | Wt 160.8 lb

## 2016-06-18 DIAGNOSIS — C9002 Multiple myeloma in relapse: Secondary | ICD-10-CM | POA: Diagnosis not present

## 2016-06-18 DIAGNOSIS — R4182 Altered mental status, unspecified: Secondary | ICD-10-CM | POA: Diagnosis not present

## 2016-06-18 DIAGNOSIS — I10 Essential (primary) hypertension: Secondary | ICD-10-CM | POA: Diagnosis not present

## 2016-06-18 DIAGNOSIS — C9000 Multiple myeloma not having achieved remission: Secondary | ICD-10-CM

## 2016-06-18 LAB — CBC WITH DIFFERENTIAL/PLATELET
BASO%: 0.2 % (ref 0.0–2.0)
Basophils Absolute: 0 10*3/uL (ref 0.0–0.1)
EOS%: 0.2 % (ref 0.0–7.0)
Eosinophils Absolute: 0 10*3/uL (ref 0.0–0.5)
HEMATOCRIT: 24.3 % — AB (ref 38.4–49.9)
HEMOGLOBIN: 8.2 g/dL — AB (ref 13.0–17.1)
LYMPH#: 0.4 10*3/uL — AB (ref 0.9–3.3)
LYMPH%: 13.9 % — ABNORMAL LOW (ref 14.0–49.0)
MCH: 32.2 pg (ref 27.2–33.4)
MCHC: 33.7 g/dL (ref 32.0–36.0)
MCV: 95.4 fL (ref 79.3–98.0)
MONO#: 0.5 10*3/uL (ref 0.1–0.9)
MONO%: 16.2 % — ABNORMAL HIGH (ref 0.0–14.0)
NEUT%: 69.5 % (ref 39.0–75.0)
NEUTROS ABS: 2.2 10*3/uL (ref 1.5–6.5)
PLATELETS: 179 10*3/uL (ref 140–400)
RBC: 2.54 10*6/uL — ABNORMAL LOW (ref 4.20–5.82)
RDW: 17.2 % — AB (ref 11.0–14.6)
WBC: 3.1 10*3/uL — ABNORMAL LOW (ref 4.0–10.3)

## 2016-06-18 LAB — COMPREHENSIVE METABOLIC PANEL
ALBUMIN: 3.1 g/dL — AB (ref 3.5–5.0)
ALT: 20 U/L (ref 0–55)
ANION GAP: 10 meq/L (ref 3–11)
AST: 16 U/L (ref 5–34)
Alkaline Phosphatase: 62 U/L (ref 40–150)
BILIRUBIN TOTAL: 1.11 mg/dL (ref 0.20–1.20)
BUN: 41.5 mg/dL — AB (ref 7.0–26.0)
CALCIUM: 9.4 mg/dL (ref 8.4–10.4)
CHLORIDE: 103 meq/L (ref 98–109)
CO2: 23 mEq/L (ref 22–29)
CREATININE: 1.3 mg/dL (ref 0.7–1.3)
EGFR: 52 mL/min/{1.73_m2} — ABNORMAL LOW (ref >90)
Glucose: 104 mg/dl (ref 70–140)
Potassium: 3.8 mEq/L (ref 3.5–5.1)
Sodium: 136 mEq/L (ref 136–145)
TOTAL PROTEIN: 5.8 g/dL — AB (ref 6.4–8.3)

## 2016-06-18 LAB — HEPATIC FUNCTION PANEL
ALK PHOS: 62 U/L (ref 25–125)
ALT: 20 U/L (ref 10–40)
AST: 16 U/L (ref 14–40)
Bilirubin, Total: 1.1 mg/dL

## 2016-06-18 LAB — CBC AND DIFFERENTIAL
HCT: 24 % — AB (ref 41–53)
Hemoglobin: 8.2 g/dL — AB (ref 13.5–17.5)
Platelets: 179 10*3/uL (ref 150–399)
WBC: 3.1 10^3/mL

## 2016-06-18 LAB — TECHNOLOGIST REVIEW

## 2016-06-18 LAB — BASIC METABOLIC PANEL
BUN: 42 mg/dL — AB (ref 4–21)
CREATININE: 1.3 mg/dL (ref 0.6–1.3)
Glucose: 104 mg/dL
POTASSIUM: 3.8 mmol/L (ref 3.4–5.3)
SODIUM: 136 mmol/L — AB (ref 137–147)

## 2016-06-18 MED ORDER — ZOLEDRONIC ACID 4 MG/5ML IV CONC: 4.0000 mg | Freq: Once | INTRAVENOUS | Status: DC

## 2016-06-18 NOTE — Progress Notes (Signed)
[facility] OFFICE PROGRESS NOTE   Diagnosis: Multiple myeloma   INTERVAL HISTORY:   Richard [name] returns for a scheduled visit. He was discharged on 06/05/2016 after admission for treatment of a left femoral neck fracture. He developed hospital delirium. He now resides at the [facility]. His daughter reports his mental status has improved significantly. He continues pomalidomide/Decadron. He is not taking pain medication.  Richard [name] is completing a physical therapy program. He can ambulate, but is not yet independent for his activities of daily living. His daughter reports a Doppler of the legs was negative for DVT on 06/10/2016. He developed bilateral leg swelling when discharged from the hospital. This has improved. Good appetite.  Objective:  Vital signs in last 24 hours:  Blood pressure 132/63, pulse 57, temperature 99.4 F (37.4 C), temperature source Oral, resp. rate 18, height 5' 8"  (1.727 m), weight 160 lb 12.8 oz (72.938 kg), SpO2 97 %.    HEENT: No thrush or ulcers Resp: Lungs clear bilaterally Cardio: Irregular GI: No hepatosplenomegaly, nontender Vascular: Pitting edema at the left greater than right lower leg and ankle Neuro: Alert, oriented to place and diagnosis.  Skin: Few ecchymoses     Lab Results:  06/10/2016 at [facility]-hemoglobin 8.5, platelet is 100,000, white count 2.7 Sodium 133, creatinine 0.87, calcium 9.3  Medications: I have reviewed the patient's current medications.  Assessment/Plan: 1. Multiple myeloma: The serum free light chains were elevated 08/29/2009. A CT of the chest 08/31/2009 showed a new pleural mass consistent with progression of multiple myeloma. He completed 12 cycles of Revlimid/Decadron. The serum free light chains were improved to 4.34 on 12/29/2009. A restaging CT of the chest 02/19/2010 showed resolution of right axillary lymphadenopathy and a pleural-based mass. The serum free lambda light  chains were again elevated.  Sal[place]age Revlimid/Decadron initiated 12/08/2014  Cycle 2 01/05/2015  Serum light chains improved 01/24/2015  Cycle 3 02/01/2015  Cycle 4 03/02/2015  Cycle 5 03/30/2015  Cycle 6 04/27/2015  Cycle 7 05/25/2015  Cycle 8 06/22/2015  Cycle 9 07/20/2015  Cycle 10 08/18/2015  Changed to maintenance Revlimid beginning 09/23/2015  Progressive anemia and Elevated serum free light chains 11/13/2015  Changed to weekly Cytoxan/Velcade/Decadron beginning 11/21/2015, changed to 3/4 week schedule beginning 01/02/2016  Serum light chains improved on 03/26/2016  Serum light chains increased 04/23/2016  Serum light chains increased 05/21/2016  Cycle 1 pomalidonide/decadron 05/30/2016 2.History of back pain, likely related to the pleural-based mass at the right chest, resolved. 2. Right axillary/subpectoral lymphadenopathy on a CT of the chest 08/31/2009, likely related to multiple myeloma, resolved. 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 He is maintained on every three-month Zometa. 4. Pancytopenia secondary to Revlimid and multiple myeloma. He has persistent mild thrombocytopenia. 5. Hypertension. 6. Status post hip replacement. 7. History of back surgery. 8. History of trigeminal neuralgia. 9. Status post removal of a lipoma from the right axilla in February 2010. 10. 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 11. Esophageal reflux disease, followed by Dr. Janace Hoard.  12. Esophageal stricture, status post an evaluation by Dr. Henrene Pastor. 13. Right vocal cord lesion, status post a biopsy by Dr. 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 He completed radiation under the direction of Dr. Valere Dross on 03/15/2011. 14. Admission with pneumonia 03/26/2010. 15. History of Atrial flutter/fibrillation while hospitalized August 2011. He is followed by Dr. Percival Spanish. 16. Elevated prostate specific antigen,  status post a biopsy 01/23/2011. He was found to  have a Gleason 6 cancer in 10% of the cores. He is followed with an observation approach. He is now followed by Dr. Diona Fanti. 17. History of Mild hypercalcemia  18. Fall with a left wrist fracture June 2015  19. Right chest wall pain secondary to a right second rib plasmacytoma confirmed on a CT 11/28/2014, resolved 20. History of Hyponatremia 21. Thrombocytopenia secondary progression of multiple myeloma and chemotherapy 22. Fall with left femoral neck fracture 06/02/2016, left hemiarthroplasty 06/03/2016 23. Hospital delirium June 2017     Disposition:  Richard [name] is recovering from the left femur fracture. He now resides in a skilled nursing facility. He developed profound hospital delirium last month. His mental status appears improved, though he continues to have some alteration of his mental status. He may have underlying dementia. He is completing a first cycle of pomalidomide/Decadron for treatment of refractory multiple myeloma. We will check a CBC, Chemstrip panel, and serum light chains today. He will return for an office visit in 2 weeks. He will be due to start the next cycle of treatment on 06/27/2016.  Richard [name] does not appear stable for discharge to his home. He lives alone. His daughter will try to keep him in the skilled nursing facility for now.    Betsy Coder, MD  06/18/2016  8:43 AM

## 2016-06-18 NOTE — Progress Notes (Signed)
Pharmacy checked with patients MD and Mr Swan was not due for Zometa. Pts daughter stated that she was told by scheduling that he was due for this treatment. Patients dgtr/ Rip Harbour voiced frustration with haven been given incorrect information. Charge RN was notified of this issue. Pt verbalized understanding also.

## 2016-06-18 NOTE — Patient Instructions (Signed)

## 2016-06-18 NOTE — Telephone Encounter (Signed)
daughter upset stating not sure if pt can come on 7/13 for zometa and she will call Dr Franco Nones walked out and never got the avs

## 2016-06-18 NOTE — Telephone Encounter (Signed)
Per Dr. Benay Spice: [name] labs suggest dehydration. Stop Lasix. Order called to [facility]. Spoke with pt's nurse, [place]. Lab results faxed along with note to stop Lasix, per MD.

## 2016-06-18 NOTE — Telephone Encounter (Signed)
Dr. Benay Spice spoke with pt's daughter re: her concerns about depression. No new medications ordered. Pt appears to be at baseline mood and behavior wise at this time.

## 2016-06-18 NOTE — Telephone Encounter (Signed)
Per staff message and POF I have scheduled appts. Advised scheduler of appts. JMW  

## 2016-06-18 NOTE — Telephone Encounter (Signed)
per pof to sch pt appt-Melissa/MW o do override appt for 7/26@8 :30

## 2016-06-19 ENCOUNTER — Other Ambulatory Visit: Payer: Self-pay | Admitting: *Deleted

## 2016-06-19 LAB — KAPPA/LAMBDA LIGHT CHAINS
IG LAMBDA FREE LIGHT CHAIN: 28.6 mg/L — AB (ref 5.7–26.3)
Ig Kappa Free Light Chain: 19.7 mg/L — ABNORMAL HIGH (ref 3.3–19.4)
Kappa/Lambda FluidC Ratio: 0.69 (ref 0.26–1.65)

## 2016-06-19 MED ORDER — POMALIDOMIDE 3 MG PO CAPS: 3.0000 mg | ORAL_CAPSULE | Freq: Every day | ORAL | Status: DC

## 2016-06-20 ENCOUNTER — Ambulatory Visit: Payer: Medicare Other

## 2016-06-20 ENCOUNTER — Encounter: Payer: Self-pay | Admitting: Internal Medicine

## 2016-06-20 ENCOUNTER — Non-Acute Institutional Stay (SKILLED_NURSING_FACILITY): Payer: Medicare Other | Admitting: Internal Medicine

## 2016-06-20 DIAGNOSIS — R609 Edema, unspecified: Secondary | ICD-10-CM

## 2016-06-20 DIAGNOSIS — J028 Acute pharyngitis due to other specified organisms: Secondary | ICD-10-CM

## 2016-06-20 DIAGNOSIS — C320 Malignant neoplasm of glottis: Secondary | ICD-10-CM | POA: Diagnosis not present

## 2016-06-20 NOTE — Progress Notes (Signed)
Patient ID: Richard [name], male   DOB: November 10, 1933, 82 y.o.   MRN: [id number]  Location:    [facility] Service:  SNF [id number]) Provider:  Jetty Duhamel, MD  Patient Care Team: Fanny Bien, MD as PCP - General (Family Medicine) Ladell Pier, MD as Consulting Physician (Oncology)  Extended Emergency Contact Information Primary Emergency Contact: Sandria Senter Address: [place]          [place] Phone: [phone] Relation: Daughter  Code Status:  Full Code Goals of care: Advanced Directive information Advanced Directives 06/13/2016  Does patient have an advance directive? Yes  Type of Advance Directive (No Data)  Does patient want to make changes to advanced directive? No - Patient declined  Copy of advanced directive(s) in chart? Yes     Chief Complaint  Patient presents with  . Acute Visit  Follow-up edema also complaints of sore throat  HPI:  Pt is a 82 y.o. male seen today for an acute visit for follow-up of edema-patient had been on low dose hydrochlorothiazide but secondary to low sodiiumwas discontinued-patient did develop some increased edema was low  er extremities especially feet we started low-dose Lasix   We also ordered a venous Doppler rule out DVT on the left leg and this was negative.   Patient did see his oncologist on July 11-and Lasix was discontinued secondary to concerns that it may lead to significant hyponatremia appears on lab done at oncology sodium was 136.  Oncologist recommended starting low-dose hydrochlorothiazide if edema worsens.    When I saw patient last week he also was complaining of a sore throat-- started amoxicillin for concerns of pharyngitis-this was continued by the oncologist when patient was seen earlier this week per recommendation of possibly consider another therapy if sore throat persists-I did speak with his daughter today and she feels a sore  throat is persisting and at times does have some yellowish phlegm as well  Vital signs are stable he is afebrile is mental status is improved and stabilized he had significant confusion initially but per his daughter this is trending in a favorable direction.  He continues to be pleasant and conversant today    Past Medical History  Diagnosis Date  . Hypertension   . Dyslipidemia   . Arrhythmia     atrial fibrillation  . Cholelithiasis   . GERD (gastroesophageal reflux disease)   . Hiatal hernia   . Esophageal stricture   . History of BPH   . History of radiation therapy 01/29/11 -03/15/11    larynx 6600 cGy 33 sessions  . Arthritis   . Anemia     pt's daughter denies  . Multiple myeloma   . 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 Prostate cancer ([place]) 01/23/11    Gleason 3+3=6  . Facial basal cell cancer     nose  . Cataract     bil removed  . Pneumonia   . Hyponatremia   . Left displaced femoral neck fracture ([place])   . Hordeolum externum (stye)   . Hypokalemia   . Dehydration   . Pancytopenia ([place])   . Hyperbilirubinemia   . Essential hypertension, benign   . Esophageal stricture   . Dysphagia following unspecified cerebrovascular disease   . Abnormal finding on GI tract imaging   . GERD (gastroesophageal reflux disease)   . Cholelithiasis   . Pulmonary  hypertension ([place])   . Atrial fibrillation ([place])   . Murmur    Past Surgical History  Procedure Laterality Date  . Hip surgery    . Back surgery    . Trigeminal neuralgia    . Appendectomy    . Tonsillectomy    . Right chest wall resection      of second ,third,and fourth rib  . Esophageal dilation    . Colonoscopy    . Upper gastrointestinal endoscopy    . Hip arthroplasty Left 06/03/2016    Procedure: ARTHROPLASTY BIPOLAR HIP LEFT  (HEMIARTHROPLASTY);  Surgeon: Paralee Cancel, MD;  Location: [facility];  Service: Orthopedics;  Laterality: Left;    Allergies  Allergen Reactions  . Celebrex  [Celecoxib] Other (See Comments)    GI bleeding  . Coricidin D Cold-Flu-Sinus  [Chlorphen-Pe-Acetaminophen] Hives      Medication List       This list is accurate as of: 06/20/16  4:42 PM.  Always use your most recent med list.               acetaminophen 325 MG tablet  Commonly known as:  TYLENOL  Take 2 tablets (650 mg total) by mouth every 6 (six) hours as needed for mild pain, fever or headache.     aspirin EC 81 MG tablet  Take 81 mg by mouth every morning.     B-12 2500 MCG Subl  Place 2,500 mcg under the tongue every morning.     Cholecalciferol 5000 units Tabs  Take 5,000 Units by mouth every morning.     dexamethasone 4 MG tablet  Commonly known as:  DECADRON  Take 40 mg by mouth once a week. Take 10 tablets by mouth once weekly on Mondays     docusate sodium 100 MG capsule  Commonly known as:  COLACE  Take 1 capsule (100 mg total) by mouth 2 (two) times daily.     feeding supplement (ENSURE ENLIVE) Liqd  Take 237 mLs by mouth 2 (two) times daily between meals.     ferrous sulfate 325 (65 FE) MG tablet  Take 1 tablet (325 mg total) by mouth 3 (three) times daily after meals.     FLOMAX 0.4 MG Caps capsule  Generic drug:  tamsulosin  Take 0.4 mg by mouth daily after breakfast.     hydrochlorothiazide 12.5 MG capsule  Commonly known as:  MICROZIDE  Take 12.5 mg by mouth daily.     HYDROcodone-acetaminophen 5-325 MG tablet  Commonly known as:  NORCO/VICODIN  Take 1-2 tablets by mouth every 8 (eight) hours as needed for severe pain.     losartan 50 MG tablet  Commonly known as:  COZAAR  Take 50 mg by mouth daily.     multivitamin with minerals Tabs tablet  Take 1 tablet by mouth every morning.     pantoprazole 40 MG tablet  Commonly known as:  PROTONIX  Take 1 tablet (40 mg total) by mouth daily.     pomalidomide 3 MG capsule  Commonly known as:  POMALYST  Take 1 capsule (3 mg total) by mouth daily. Take with water on days 1-21. Repeat every 28  days. Start date 06/27/16  Start taking on:  06/27/2016     potassium chloride 10 MEQ tablet  Commonly known as:  K-DUR  Take 10 mEq by mouth every other day.     PROBIOTIC DAILY PO  Take by mouth 2 (two) times daily.     QUEtiapine 25  MG tablet  Commonly known as:  SEROQUEL  Take 1 tablet (25 mg total) by mouth at bedtime. To be given as part of treatment for acquired delirium; stop once mentation back to baseline     rOPINIRole 0.25 MG tablet  Commonly known as:  REQUIP  Take 1-2 tablet by mouth once daily at bedtime As needed for restless legs     zolendronic acid 4 mg in sodium chloride 0.9 % 100 mL  Inject into the vein. Is receiving treatment through [facility] next infusion due 06/20/16         Review of Systems   In general does not complaining any fever or chills appears to be getting a bit stronger.  Skin does not complain of rashes or itching.  Head ears eyes nose mouth and throat no complaints  of visual changes is complaining of a sore throat however.  Respiratory does not complaining of shortness breath or cough.--Has had some yellowish phlegm per daughter  Cardiac no chest pain continues to have some mild lower extremity edema this appears to be somewhat increased  GI is not complaining of nausea vomiting diarrhea constipation or abdominal discomfort.  Muscle skeletal is not complaining currently of joint pain does have a history of left hip fracture with repair is working with therapy.--And actually saw orthopedics today  Neurologic not complaining of dizziness headache or numbness.  Psych-had some initial confusion but this appears to be gradually resolving.    Immunization History  Administered Date(s) Administered  . Influenza Split 10/01/2011, 09/22/2012  . Influenza,inj,Quad PF,36+ Mos 09/08/2013, 08/29/2014, 08/16/2015  . Pneumococcal Polysaccharide-23 08/09/2010   Pertinent  Health Maintenance Due  Topic Date Due  . PNA vac Low Risk Adult  (2 of 2 - PCV13) 06/06/2017 (Originally 08/10/2011)  . INFLUENZA VACCINE  07/09/2016   Fall Risk  08/01/2015 12/27/2014 11/28/2014 08/29/2014 05/26/2014  Falls in the past year? No No Yes Yes Yes  Number falls in past yr: - - 1 1 2  or more  Injury with Fall? - - Yes Yes -  Risk Factor Category  - - - - High Fall Risk  Risk for fall due to : - - Other (Comment) Impaired balance/gait History of fall(s);Impaired balance/gait  Risk for fall due to (comments): - - tripped over dog - -   Functional Status Survey:    Filed Vitals:   06/20/16 1633  BP: 137/65  Pulse: 94  Temp: 98.8 F (37.1 C)  Resp: 18   There is no weight on file to calculate BMI. Physical Exam   In general this is a pleasant elderly male in no distress sitting comfortably in his wheelchair.  His skin is warm and dry  Oropharynx mucous members are moist appears to have  -some slightly increased erythema of the throat area persists I do not see any exudate-his daughter does report at times some yellowish phlegm production  Chest is clear to auscultation there is no labored breathing.  Heart is regular irregular rate and rhythm without murmur gallop or rub he continues to have some edema Was lower extremities appears somewhat increased from previous exam.  Abdomen is soft nontender positive bowel sounds.  Muscle skeletal is able to move all extremities 4 again limited on the left lower extremity secondary to recent hip fracture with repair.  Neurologic cranial nerves are grossly intact to speech is clear no lateralizing findings.  Psychological-he is oriented to self is pleasant and conversant can carry on a conversation which was  not the case when I initially saw him.   Labs reviewed:  06/18/2016.  Sodium 136 potassium 3.8 creatinine 1.3 BUN 41.5.  Liver function tests within normal limits except albumin of 3.1.  WBC 3.1 hemoglobin 8.2 platelets of 179  06/10/2016.  WBC 2.7 hemoglobin 8.5 platelets  100,000.  Sodium 133 potassium 4.3 BUN 31 creatinine 0.87.    Recent Labs  05/27/16 1329  06/03/16 0425 06/04/16 0413 06/05/16 0430 06/18/16 0925  NA 124*  < > 131* 132* 132* 136  K 3.1*  < > 3.7 4.4 3.9 3.8  CL  --   < > 95* 100* 104  --   CO2 24  < > 30 26 23 23   GLUCOSE 125  < > 111* 161* 133* 104  BUN 9.8  < > 17 25* 27* 41.5*  CREATININE 0.9  < > 0.88 0.86 0.76 1.3  CALCIUM 9.0  < > 9.5 8.7* 8.4* 9.4  MG 1.9  --   --   --   --   --   < > = values in this interval not displayed.  Recent Labs  05/21/16 0936 05/27/16 05/27/16 1329 06/18/16 0925  AST 25 23 23 16   ALT 28 23 23 20   ALKPHOS 69 67 67 62  BILITOT 1.65*  --  1.21* 1.11  PROT 6.4  --  5.9* 5.8*  ALBUMIN 3.9  --  3.5 3.1*    Recent Labs  05/27/16 1516  06/02/16 0325  06/04/16 0413 06/05/16 0430 06/18/16 0925  WBC 4.3  < > 9.4  < > 5.3 6.5 3.1*  NEUTROABS 3.0  --  8.2*  --   --   --  2.2  HGB 9.7*  < > 10.4*  < > 8.9* 8.0* 8.2*  HCT 27.0*  < > 28.2*  < > 25.4* 22.3* 24.3*  MCV 95.1  --  92.8  < > 96.9 93.7 95.4  PLT 62*  < > 81*  < > 65* 66* 179  < > = values in this interval not displayed. Lab Results  Component Value Date   TSH 0.512 06/04/2016   No results found for: HGBA1C Lab Results  Component Value Date   CHOL  07/27/2010    82        ATP III CLASSIFICATION:  <200     mg/dL   Desirable  200-239  mg/dL   Borderline High  >=240    mg/dL   High          HDL 31* 07/27/2010   LDLCALC  07/27/2010    36        Total Cholesterol/HDL:CHD Risk Coronary Heart Disease Risk Table                     Men   Women  1/2 Average Risk   3.4   3.3  Average Risk       5.0   4.4  2 X Average Risk   9.6   7.1  3 X Average Risk  23.4   11.0        Use the calculated Patient Ratio above and the CHD Risk Table to determine the patient's CHD Risk.        ATP III CLASSIFICATION (LDL):  <100     mg/dL   Optimal  100-129  mg/dL   Near or Above  Optimal  130-159  mg/dL    Borderline  160-189  mg/dL   High  >190     mg/dL   Very High   TRIG 75 07/27/2010   CHOLHDL 2.6 07/27/2010    Significant Diagnostic Results in last 30 days:  Dg Chest 2 View  06/02/2016  CLINICAL DATA:  Year-old male with fall and left hip fracture per EXAM: CHEST  2 VIEW COMPARISON:  Radiograph dated 05/16/2016 and chest CT dated 11/28/2014 FINDINGS: Two views of the chest do not demonstrate a focal consolidation. There is no pleural effusion or pneumothorax. Postsurgical changes of resection of right upper anterior pleural/chest wall mass seen on the prior CT. The cardiac silhouette is within normal limits. There is degenerative changes of the spine. No acute fracture IMPRESSION: No acute cardiopulmonary process. Electronically Signed   By: Anner Crete M.D.   On: 06/02/2016 04:03   Pelvis Portable  06/04/2016  CLINICAL DATA:  Status post left total hip today. EXAM: PORTABLE PELVIS 1-2 VIEWS COMPARISON:  None. FINDINGS: The patient is status post left hip replacement. Hardware is in good position on a single frontal view. IMPRESSION: Appropriate placement of left hip hardware on a single frontal view. Electronically Signed   By: Dorise Bullion III M.D   On: 06/04/2016 01:15   Dg Chest Port 1 View  06/02/2016  CLINICAL DATA:  82 year old male with fever. Scheduled for hip surgery tomorrow. EXAM: PORTABLE CHEST 1 VIEW COMPARISON:  Radiograph dated 06/02/2016 FINDINGS: Single portable view of the chest do not demonstrate a focal consolidation. There is no pleural effusion or pneumothorax. A linear lucency along the lateral aspect of the right lung represent skin fold artifact. Postsurgical changes of the right upper lobe with multiple anterior rib resections and surgical clips. The cardiac silhouette is within normal limits. No acute osseous pathology. IMPRESSION: No active disease. Electronically Signed   By: Anner Crete M.D.   On: 06/02/2016 20:11   Dg Hip Unilat With Pelvis 2-3 Views  Left  06/02/2016  CLINICAL DATA:  82 year old male with fall and left hip pain. EXAM: DG HIP (WITH OR WITHOUT PELVIS) 2-3V LEFT COMPARISON:  None. FINDINGS: There is a transverse fracture of the left femoral neck with mild proximal migration of the femoral shaft. The head of the left femur remains in anatomic alignment with the acetabulum. No other acute fracture identified. The bones are osteopenic. Right hip arthroplasty. IMPRESSION: Transverse fracture of the left femoral neck. Electronically Signed   By: Anner Crete M.D.   On: 06/02/2016 04:01    Assessment/Plan  # Lower extremity edema this appears to be increasing off the Lasix-will start low dose hydrochlorothiazide 12.5 mg a day and continue to monitor also will update a metabolic panel first laboratory day next week  to keep an eye on his sodium and renal function closely appears to be doing well here.  #2 history pharyngitis-possibly with some respiratory component with phlegm-will discontinue the amoxicillin since he has been on this now for some time-will start Levaquin a short course 500 mg first day 2 50 mg for 6 additional days and monitor phlegm production respiratory status as well as status of sore throat.  I do note he has a previous history of vocal cord carcinoma-consider ENT consult if this is not effective and this was discussed with his daughter.  Olga, PA-C [phone]

## 2016-06-24 ENCOUNTER — Encounter: Payer: Self-pay | Admitting: Internal Medicine

## 2016-06-24 NOTE — Progress Notes (Signed)
Location:   [place] Room Number: 175/Z Place of Service:  SNF [phone]) Provider:  Jetty Duhamel, MD  Patient Care Team: Fanny Bien, MD as PCP - General (Family Medicine) Ladell Pier, MD as Consulting Physician (Oncology)  Extended Emergency Contact Information Primary Emergency Contact: Sandria Senter Address: [address]          [place] Phone: [phone] Relation: Daughter  Code Status:  Full Code Goals of care: Advanced Directive information Advanced Directives 06/24/2016  Does patient have an advance directive? Yes  Type of Advance Directive (No Data)  Does patient want to make changes to advanced directive? No - Patient declined  Copy of advanced directive(s) in chart? Yes     Chief Complaint  Patient presents with  . Discharge Note    HPI:  Pt is a 82 y.o. male seen today for an acute visit for    Past Medical History  Diagnosis Date  . Hypertension   . Dyslipidemia   . Arrhythmia     atrial fibrillation  . Cholelithiasis   . GERD (gastroesophageal reflux disease)   . Hiatal hernia   . Esophageal stricture   . History of BPH   . History of radiation therapy 01/29/11 -03/15/11    larynx 6600 cGy 33 sessions  . Arthritis   . Anemia     pt's daughter denies  . Multiple myeloma   . 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 Prostate cancer ([place]) 01/23/11    Gleason 3+3=6  . Facial basal cell cancer     nose  . Cataract     bil removed  . Pneumonia   . Hyponatremia   . Left displaced femoral neck fracture ([place])   . Hordeolum externum (stye)   . Hypokalemia   . Dehydration   . Pancytopenia ([place])   . Hyperbilirubinemia   . Essential hypertension, benign   . Esophageal stricture   . Dysphagia following unspecified cerebrovascular disease   . Abnormal finding on GI tract imaging   . GERD (gastroesophageal reflux disease)   . Cholelithiasis   . Pulmonary  hypertension ([place])   . Atrial fibrillation ([place])   . Murmur    Past Surgical History  Procedure Laterality Date  . Hip surgery    . Back surgery    . Trigeminal neuralgia    . Appendectomy    . Tonsillectomy    . Right chest wall resection      of second ,third,and fourth rib  . Esophageal dilation    . Colonoscopy    . Upper gastrointestinal endoscopy    . Hip arthroplasty Left 06/03/2016    Procedure: ARTHROPLASTY BIPOLAR HIP LEFT  (HEMIARTHROPLASTY);  Surgeon: Paralee Cancel, MD;  Location: [facility];  Service: Orthopedics;  Laterality: Left;    Allergies  Allergen Reactions  . Celebrex [Celecoxib] Other (See Comments)    GI bleeding  . Coricidin D Cold-Flu-Sinus  [Chlorphen-Pe-Acetaminophen] Hives      Medication List       This list is accurate as of: 06/24/16  1:11 PM.  Always use your most recent med list.               acetaminophen 325 MG tablet  Commonly known as:  TYLENOL  Take 2 tablets (650 mg total) by mouth every 6 (six) hours as needed for mild pain, fever or headache.     aspirin  EC 81 MG tablet  Take 81 mg by mouth every morning.     B-12 2500 MCG Subl  Place 2,500 mcg under the tongue every morning.     bacitracin-polymyxin b ophthalmic ointment  Commonly known as:  POLYSPORIN  Place 1 application into both eyes 4 (four) times daily.     Cholecalciferol 5000 units Tabs  Take 5,000 Units by mouth every morning.     dexamethasone 4 MG tablet  Commonly known as:  DECADRON  Take 40 mg by mouth once a week. Take 10 tablets by mouth once weekly on Mondays     docusate sodium 100 MG capsule  Commonly known as:  COLACE  Take 1 capsule (100 mg total) by mouth 2 (two) times daily.     feeding supplement (ENSURE ENLIVE) Liqd  Take 237 mLs by mouth 2 (two) times daily between meals.     ferrous sulfate 325 (65 FE) MG tablet  Take 1 tablet (325 mg total) by mouth 3 (three) times daily after meals.     FLOMAX 0.4 MG Caps capsule  Generic drug:   tamsulosin  Take 0.4 mg by mouth daily after breakfast.     HYDROcodone-acetaminophen 5-325 MG tablet  Commonly known as:  NORCO/VICODIN  Take 1-2 tablets by mouth every 8 (eight) hours as needed for severe pain.     losartan-hydrochlorothiazide 50-12.5 MG tablet  Commonly known as:  HYZAAR  Take 1 tablet by mouth daily.     multivitamin with minerals Tabs tablet  Take 1 tablet by mouth every morning.     pantoprazole 40 MG tablet  Commonly known as:  PROTONIX  Take 1 tablet (40 mg total) by mouth daily.     POMALYST 3 MG capsule  Generic drug:  pomalidomide  Take 3 mg by mouth daily. Take with water on days 1-21. Repeat every 28 days. STOP DATE 06/26/16     QUEtiapine 25 MG tablet  Commonly known as:  SEROQUEL  Take 1 tablet (25 mg total) by mouth at bedtime. To be given as part of treatment for acquired delirium; stop once mentation back to baseline     rOPINIRole 0.25 MG tablet  Commonly known as:  REQUIP  Take 1-2 tablet by mouth once daily at bedtime As needed for restless legs     zolendronic acid 4 mg in sodium chloride 0.9 % 100 mL  Inject into the vein. Is receiving treatment through [facility] next infusion due 06/20/16        Review of Systems  Immunization History  Administered Date(s) Administered  . Influenza Split 10/01/2011, 09/22/2012  . Influenza,inj,Quad PF,36+ Mos 09/08/2013, 08/29/2014, 08/16/2015  . Pneumococcal Polysaccharide-23 08/09/2010   Pertinent  Health Maintenance Due  Topic Date Due  . PNA vac Low Risk Adult (2 of 2 - PCV13) 06/06/2017 (Originally 08/10/2011)  . INFLUENZA VACCINE  07/09/2016   Fall Risk  08/01/2015 12/27/2014 11/28/2014 08/29/2014 05/26/2014  Falls in the past year? No No Yes Yes Yes  Number falls in past yr: - - 1 1 2  or more  Injury with Fall? - - Yes Yes -  Risk Factor Category  - - - - High Fall Risk  Risk for fall due to : - - Other (Comment) Impaired balance/gait History of fall(s);Impaired balance/gait  Risk for  fall due to (comments): - - tripped over dog - -   Functional Status Survey:    Filed Vitals:   06/24/16 1303  BP: 112/77  Pulse: 76  Temp: 97.2 F (36.2 C)  TempSrc: Oral  Resp: 17  Height: 5' 8"  (1.727 m)  Weight: 160 lb 1.6 oz (72.621 kg)   Body mass index is 24.35 kg/(m^2). Physical Exam  Labs reviewed:  Recent Labs  05/27/16 1329  06/03/16 0425 06/04/16 0413 06/05/16 0430 06/18/16 0925  NA 124*  < > 131* 132* 132* 136  K 3.1*  < > 3.7 4.4 3.9 3.8  CL  --   < > 95* 100* 104  --   CO2 24  < > 30 26 23 23   GLUCOSE 125  < > 111* 161* 133* 104  BUN 9.8  < > 17 25* 27* 41.5*  CREATININE 0.9  < > 0.88 0.86 0.76 1.3  CALCIUM 9.0  < > 9.5 8.7* 8.4* 9.4  MG 1.9  --   --   --   --   --   < > = values in this interval not displayed.  Recent Labs  05/21/16 0936 05/27/16 05/27/16 1329 06/18/16 0925  AST 25 23 23 16   ALT 28 23 23 20   ALKPHOS 69 67 67 62  BILITOT 1.65*  --  1.21* 1.11  PROT 6.4  --  5.9* 5.8*  ALBUMIN 3.9  --  3.5 3.1*    Recent Labs  05/27/16 1516  06/02/16 0325  06/04/16 0413 06/05/16 0430 06/18/16 0925  WBC 4.3  < > 9.4  < > 5.3 6.5 3.1*  NEUTROABS 3.0  --  8.2*  --   --   --  2.2  HGB 9.7*  < > 10.4*  < > 8.9* 8.0* 8.2*  HCT 27.0*  < > 28.2*  < > 25.4* 22.3* 24.3*  MCV 95.1  --  92.8  < > 96.9 93.7 95.4  PLT 62*  < > 81*  < > 65* 66* 179  < > = values in this interval not displayed. Lab Results  Component Value Date   TSH 0.512 06/04/2016   No results found for: HGBA1C Lab Results  Component Value Date   CHOL  07/27/2010    82        ATP III CLASSIFICATION:  <200     mg/dL   Desirable  200-239  mg/dL   Borderline High  >=240    mg/dL   High          HDL 31* 07/27/2010   LDLCALC  07/27/2010    36        Total Cholesterol/HDL:CHD Risk Coronary Heart Disease Risk Table                     Men   Women  1/2 Average Risk   3.4   3.3  Average Risk       5.0   4.4  2 X Average Risk   9.6   7.1  3 X Average Risk  23.4   11.0         Use the calculated Patient Ratio above and the CHD Risk Table to determine the patient's CHD Risk.        ATP III CLASSIFICATION (LDL):  <100     mg/dL   Optimal  100-129  mg/dL   Near or Above                    Optimal  130-159  mg/dL   Borderline  160-189  mg/dL   High  >190     mg/dL  Very High   TRIG 75 07/27/2010   CHOLHDL 2.6 07/27/2010    Significant Diagnostic Results in last 30 days:  Dg Chest 2 View  06/02/2016  CLINICAL DATA:  Year-old male with fall and left hip fracture per EXAM: CHEST  2 VIEW COMPARISON:  Radiograph dated 05/16/2016 and chest CT dated 11/28/2014 FINDINGS: Two views of the chest do not demonstrate a focal consolidation. There is no pleural effusion or pneumothorax. Postsurgical changes of resection of right upper anterior pleural/chest wall mass seen on the prior CT. The cardiac silhouette is within normal limits. There is degenerative changes of the spine. No acute fracture IMPRESSION: No acute cardiopulmonary process. Electronically Signed   By: Anner Crete M.D.   On: 06/02/2016 04:03   Pelvis Portable  06/04/2016  CLINICAL DATA:  Status post left total hip today. EXAM: PORTABLE PELVIS 1-2 VIEWS COMPARISON:  None. FINDINGS: The patient is status post left hip replacement. Hardware is in good position on a single frontal view. IMPRESSION: Appropriate placement of left hip hardware on a single frontal view. Electronically Signed   By: Dorise Bullion III M.D   On: 06/04/2016 01:15   Dg Chest Port 1 View  06/02/2016  CLINICAL DATA:  82 year old male with fever. Scheduled for hip surgery tomorrow. EXAM: PORTABLE CHEST 1 VIEW COMPARISON:  Radiograph dated 06/02/2016 FINDINGS: Single portable view of the chest do not demonstrate a focal consolidation. There is no pleural effusion or pneumothorax. A linear lucency along the lateral aspect of the right lung represent skin fold artifact. Postsurgical changes of the right upper lobe with multiple anterior rib  resections and surgical clips. The cardiac silhouette is within normal limits. No acute osseous pathology. IMPRESSION: No active disease. Electronically Signed   By: Anner Crete M.D.   On: 06/02/2016 20:11   Dg Hip Unilat With Pelvis 2-3 Views Left  06/02/2016  CLINICAL DATA:  82 year old male with fall and left hip pain. EXAM: DG HIP (WITH OR WITHOUT PELVIS) 2-3V LEFT COMPARISON:  None. FINDINGS: There is a transverse fracture of the left femoral neck with mild proximal migration of the femoral shaft. The head of the left femur remains in anatomic alignment with the acetabulum. No other acute fracture identified. The bones are osteopenic. Right hip arthroplasty. IMPRESSION: Transverse fracture of the left femoral neck. Electronically Signed   By: Anner Crete M.D.   On: 06/02/2016 04:01    Assessment/Plan There are no diagnoses linked to this encounter.      Oralia Manis, [place] [phone]   This encounter was created in error - please disregard.

## 2016-06-24 NOTE — Progress Notes (Deleted)
Location:   [place] Room Number: 614/E Place of Service:  SNF [phone]) Provider:  Jetty Duhamel, MD  Patient Care Team: Fanny Bien, MD as PCP - General (Family Medicine) Ladell Pier, MD as Consulting Physician (Oncology)  Extended Emergency Contact Information Primary Emergency Contact: Sandria Senter Address: [address]          [place] Phone: [phone] Relation: Daughter  Code Status:  Full Code Goals of care: Advanced Directive information Advanced Directives 06/24/2016  Does patient have an advance directive? Yes  Type of Advance Directive (No Data)  Does patient want to make changes to advanced directive? No - Patient declined  Copy of advanced directive(s) in chart? Yes     Chief Complaint  Patient presents with  . Discharge Note    HPI:  Pt is a 82 y.o. male seen today for an acute visit for    Past Medical History  Diagnosis Date  . Hypertension   . Dyslipidemia   . Arrhythmia     atrial fibrillation  . Cholelithiasis   . GERD (gastroesophageal reflux disease)   . Hiatal hernia   . Esophageal stricture   . History of BPH   . History of radiation therapy 01/29/11 -03/15/11    larynx 6600 cGy 33 sessions  . Arthritis   . Anemia     pt's daughter denies  . Multiple myeloma   . 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 Prostate cancer ([place]) 01/23/11    Gleason 3+3=6  . Facial basal cell cancer     nose  . Cataract     bil removed  . Pneumonia   . Hyponatremia   . Left displaced femoral neck fracture ([place])   . Hordeolum externum (stye)   . Hypokalemia   . Dehydration   . Pancytopenia ([place])   . Hyperbilirubinemia   . Essential hypertension, benign   . Esophageal stricture   . Dysphagia following unspecified cerebrovascular disease   . Abnormal finding on GI tract imaging   . GERD (gastroesophageal reflux disease)   . Cholelithiasis   . Pulmonary  hypertension ([place])   . Atrial fibrillation ([place])   . Murmur    Past Surgical History  Procedure Laterality Date  . Hip surgery    . Back surgery    . Trigeminal neuralgia    . Appendectomy    . Tonsillectomy    . Right chest wall resection      of second ,third,and fourth rib  . Esophageal dilation    . Colonoscopy    . Upper gastrointestinal endoscopy    . Hip arthroplasty Left 06/03/2016    Procedure: ARTHROPLASTY BIPOLAR HIP LEFT  (HEMIARTHROPLASTY);  Surgeon: Paralee Cancel, MD;  Location: [facility];  Service: Orthopedics;  Laterality: Left;    Allergies  Allergen Reactions  . Celebrex [Celecoxib] Other (See Comments)    GI bleeding  . Coricidin D Cold-Flu-Sinus  [Chlorphen-Pe-Acetaminophen] Hives      Medication List       This list is accurate as of: 06/24/16  1:09 PM.  Always use your most recent med list.               acetaminophen 325 MG tablet  Commonly known as:  TYLENOL  Take 2 tablets (650 mg total) by mouth every 6 (six) hours as needed for mild pain, fever or headache.  aspirin EC 81 MG tablet  Take 81 mg by mouth every morning.     B-12 2500 MCG Subl  Place 2,500 mcg under the tongue every morning.     bacitracin-polymyxin b ophthalmic ointment  Commonly known as:  POLYSPORIN  Place 1 application into both eyes 4 (four) times daily.     Cholecalciferol 5000 units Tabs  Take 5,000 Units by mouth every morning.     dexamethasone 4 MG tablet  Commonly known as:  DECADRON  Take 40 mg by mouth once a week. Take 10 tablets by mouth once weekly on Mondays     docusate sodium 100 MG capsule  Commonly known as:  COLACE  Take 1 capsule (100 mg total) by mouth 2 (two) times daily.     feeding supplement (ENSURE ENLIVE) Liqd  Take 237 mLs by mouth 2 (two) times daily between meals.     ferrous sulfate 325 (65 FE) MG tablet  Take 1 tablet (325 mg total) by mouth 3 (three) times daily after meals.     FLOMAX 0.4 MG Caps capsule  Generic drug:   tamsulosin  Take 0.4 mg by mouth daily after breakfast.     HYDROcodone-acetaminophen 5-325 MG tablet  Commonly known as:  NORCO/VICODIN  Take 1-2 tablets by mouth every 8 (eight) hours as needed for severe pain.     losartan-hydrochlorothiazide 50-12.5 MG tablet  Commonly known as:  HYZAAR  Take 1 tablet by mouth daily.     multivitamin with minerals Tabs tablet  Take 1 tablet by mouth every morning.     pantoprazole 40 MG tablet  Commonly known as:  PROTONIX  Take 1 tablet (40 mg total) by mouth daily.     POMALYST 3 MG capsule  Generic drug:  pomalidomide  Take 3 mg by mouth daily. Take with water on days 1-21. Repeat every 28 days. STOP DATE 06/26/16     QUEtiapine 25 MG tablet  Commonly known as:  SEROQUEL  Take 1 tablet (25 mg total) by mouth at bedtime. To be given as part of treatment for acquired delirium; stop once mentation back to baseline     rOPINIRole 0.25 MG tablet  Commonly known as:  REQUIP  Take 1-2 tablet by mouth once daily at bedtime As needed for restless legs     zolendronic acid 4 mg in sodium chloride 0.9 % 100 mL  Inject into the vein. Is receiving treatment through [facility] next infusion due 06/20/16        Review of Systems  Immunization History  Administered Date(s) Administered  . Influenza Split 10/01/2011, 09/22/2012  . Influenza,inj,Quad PF,36+ Mos 09/08/2013, 08/29/2014, 08/16/2015  . Pneumococcal Polysaccharide-23 08/09/2010   Pertinent  Health Maintenance Due  Topic Date Due  . PNA vac Low Risk Adult (2 of 2 - PCV13) 06/06/2017 (Originally 08/10/2011)  . INFLUENZA VACCINE  07/09/2016   Fall Risk  08/01/2015 12/27/2014 11/28/2014 08/29/2014 05/26/2014  Falls in the past year? No No Yes Yes Yes  Number falls in past yr: - - 1 1 2  or more  Injury with Fall? - - Yes Yes -  Risk Factor Category  - - - - High Fall Risk  Risk for fall due to : - - Other (Comment) Impaired balance/gait History of fall(s);Impaired balance/gait  Risk for  fall due to (comments): - - tripped over dog - -   Functional Status Survey:     Body mass index is 24.35 kg/(m^2). Physical Exam  Labs  reviewed:  Recent Labs  05/27/16 1329  06/03/16 0425 06/04/16 0413 06/05/16 0430 06/18/16 0925  NA 124*  < > 131* 132* 132* 136  K 3.1*  < > 3.7 4.4 3.9 3.8  CL  --   < > 95* 100* 104  --   CO2 24  < > 30 26 23 23   GLUCOSE 125  < > 111* 161* 133* 104  BUN 9.8  < > 17 25* 27* 41.5*  CREATININE 0.9  < > 0.88 0.86 0.76 1.3  CALCIUM 9.0  < > 9.5 8.7* 8.4* 9.4  MG 1.9  --   --   --   --   --   < > = values in this interval not displayed.  Recent Labs  05/21/16 0936 05/27/16 05/27/16 1329 06/18/16 0925  AST 25 23 23 16   ALT 28 23 23 20   ALKPHOS 69 67 67 62  BILITOT 1.65*  --  1.21* 1.11  PROT 6.4  --  5.9* 5.8*  ALBUMIN 3.9  --  3.5 3.1*    Recent Labs  05/27/16 1516  06/02/16 0325  06/04/16 0413 06/05/16 0430 06/18/16 0925  WBC 4.3  < > 9.4  < > 5.3 6.5 3.1*  NEUTROABS 3.0  --  8.2*  --   --   --  2.2  HGB 9.7*  < > 10.4*  < > 8.9* 8.0* 8.2*  HCT 27.0*  < > 28.2*  < > 25.4* 22.3* 24.3*  MCV 95.1  --  92.8  < > 96.9 93.7 95.4  PLT 62*  < > 81*  < > 65* 66* 179  < > = values in this interval not displayed. Lab Results  Component Value Date   TSH 0.512 06/04/2016   No results found for: HGBA1C Lab Results  Component Value Date   CHOL  07/27/2010    82        ATP III CLASSIFICATION:  <200     mg/dL   Desirable  200-239  mg/dL   Borderline High  >=240    mg/dL   High          HDL 31* 07/27/2010   LDLCALC  07/27/2010    36        Total Cholesterol/HDL:CHD Risk Coronary Heart Disease Risk Table                     Men   Women  1/2 Average Risk   3.4   3.3  Average Risk       5.0   4.4  2 X Average Risk   9.6   7.1  3 X Average Risk  23.4   11.0        Use the calculated Patient Ratio above and the CHD Risk Table to determine the patient's CHD Risk.        ATP III CLASSIFICATION (LDL):  <100     mg/dL    Optimal  100-129  mg/dL   Near or Above                    Optimal  130-159  mg/dL   Borderline  160-189  mg/dL   High  >190     mg/dL   Very High   TRIG 75 07/27/2010   CHOLHDL 2.6 07/27/2010    Significant Diagnostic Results in last 30 days:  Dg Chest 2 View  06/02/2016  CLINICAL DATA:  Year-old male with fall  and left hip fracture per EXAM: CHEST  2 VIEW COMPARISON:  Radiograph dated 05/16/2016 and chest CT dated 11/28/2014 FINDINGS: Two views of the chest do not demonstrate a focal consolidation. There is no pleural effusion or pneumothorax. Postsurgical changes of resection of right upper anterior pleural/chest wall mass seen on the prior CT. The cardiac silhouette is within normal limits. There is degenerative changes of the spine. No acute fracture IMPRESSION: No acute cardiopulmonary process. Electronically Signed   By: Anner Crete M.D.   On: 06/02/2016 04:03   Pelvis Portable  06/04/2016  CLINICAL DATA:  Status post left total hip today. EXAM: PORTABLE PELVIS 1-2 VIEWS COMPARISON:  None. FINDINGS: The patient is status post left hip replacement. Hardware is in good position on a single frontal view. IMPRESSION: Appropriate placement of left hip hardware on a single frontal view. Electronically Signed   By: Dorise Bullion III M.D   On: 06/04/2016 01:15   Dg Chest Port 1 View  06/02/2016  CLINICAL DATA:  82 year old male with fever. Scheduled for hip surgery tomorrow. EXAM: PORTABLE CHEST 1 VIEW COMPARISON:  Radiograph dated 06/02/2016 FINDINGS: Single portable view of the chest do not demonstrate a focal consolidation. There is no pleural effusion or pneumothorax. A linear lucency along the lateral aspect of the right lung represent skin fold artifact. Postsurgical changes of the right upper lobe with multiple anterior rib resections and surgical clips. The cardiac silhouette is within normal limits. No acute osseous pathology. IMPRESSION: No active disease. Electronically Signed    By: Anner Crete M.D.   On: 06/02/2016 20:11   Dg Hip Unilat With Pelvis 2-3 Views Left  06/02/2016  CLINICAL DATA:  82 year old male with fall and left hip pain. EXAM: DG HIP (WITH OR WITHOUT PELVIS) 2-3V LEFT COMPARISON:  None. FINDINGS: There is a transverse fracture of the left femoral neck with mild proximal migration of the femoral shaft. The head of the left femur remains in anatomic alignment with the acetabulum. No other acute fracture identified. The bones are osteopenic. Right hip arthroplasty. IMPRESSION: Transverse fracture of the left femoral neck. Electronically Signed   By: Anner Crete M.D.   On: 06/02/2016 04:01    Assessment/Plan There are no diagnoses linked to this encounter.      Oralia Manis, [place]

## 2016-06-25 ENCOUNTER — Non-Acute Institutional Stay (SKILLED_NURSING_FACILITY): Payer: Medicare Other | Admitting: Internal Medicine

## 2016-06-25 ENCOUNTER — Encounter: Payer: Self-pay | Admitting: Internal Medicine

## 2016-06-25 DIAGNOSIS — D61818 Other pancytopenia: Secondary | ICD-10-CM | POA: Diagnosis not present

## 2016-06-25 DIAGNOSIS — G2581 Restless legs syndrome: Secondary | ICD-10-CM | POA: Diagnosis not present

## 2016-06-25 DIAGNOSIS — I4891 Unspecified atrial fibrillation: Secondary | ICD-10-CM

## 2016-06-25 DIAGNOSIS — C9000 Multiple myeloma not having achieved remission: Secondary | ICD-10-CM | POA: Diagnosis not present

## 2016-06-25 DIAGNOSIS — S72002D Fracture of unspecified part of neck of left femur, subsequent encounter for closed fracture with routine healing: Secondary | ICD-10-CM | POA: Diagnosis not present

## 2016-06-25 DIAGNOSIS — N400 Enlarged prostate without lower urinary tract symptoms: Secondary | ICD-10-CM

## 2016-06-25 DIAGNOSIS — I10 Essential (primary) hypertension: Secondary | ICD-10-CM

## 2016-06-25 DIAGNOSIS — E871 Hypo-osmolality and hyponatremia: Secondary | ICD-10-CM

## 2016-06-25 NOTE — Progress Notes (Signed)
MRN: [id number] Name: Richard [name]  Sex: male Age: 82 y.o. DOB: 1933-07-24  [place] #:  Facility/Room: [facility] / 104 P Level Of Care: SNF Provider: Noah Delaine. Sheppard Coil, MD Emergency Contacts: Extended Emergency Contact Information Primary Emergency Contact: Storey,Melinda Address: [place], [place] Phone: [phone] Relation: Daughter  Code Status: Full Code  Allergies: Celebrex and Coricidin d cold-flu-sinus   Chief Complaint  Patient presents with  . Discharge Note    Discharged from SNF    HPI: Patient is 82 y.o. male with multiple myeloma followed by Dr. Benay Spice, prostate cancer, hyponatremia, HTN, HLD, and atrial fibrillation; who presents after having a fall at home yesterday evening while trying to use the restroom. Patient notes that he had tried to get up, but was unable to bear weight due to pain in the left hip. He denies any loss of consciousness or trauma to his head with the fall. Pt was admitted to [facility] fro 6/25-28 where pt underwent a L hemi arthroplasty Hospital course was complicated by delirium, hyponatremia and pancytopenia from current chemotherapy. Pt is admitted to SNF for OT/PT and he is now ready to be d/c to home.  Past Medical History  Diagnosis Date  . Hypertension   . Dyslipidemia   . Arrhythmia     atrial fibrillation  . Cholelithiasis   . GERD (gastroesophageal reflux disease)   . Hiatal hernia   . Esophageal stricture   . History of BPH   . History of radiation therapy 01/29/11 -03/15/11    larynx 6600 cGy 33 sessions  . Arthritis   . Anemia     pt's daughter denies  . Multiple myeloma   . 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 Prostate cancer ([place]) 01/23/11    Gleason 3+3=6  . Facial basal cell cancer     nose  . Cataract     bil removed  . Pneumonia   . Hyponatremia   . Left displaced femoral neck fracture ([place])   . Hordeolum externum (stye)   . Hypokalemia   .  Dehydration   . Pancytopenia ([place])   . Hyperbilirubinemia   . Essential hypertension, benign   . Esophageal stricture   . Dysphagia following unspecified cerebrovascular disease   . Abnormal finding on GI tract imaging   . GERD (gastroesophageal reflux disease)   . Cholelithiasis   . Pulmonary hypertension ([place])   . Atrial fibrillation ([place])   . Murmur     Past Surgical History  Procedure Laterality Date  . Hip surgery    . Back surgery    . Trigeminal neuralgia    . Appendectomy    . Tonsillectomy    . Right chest wall resection      of second ,third,and fourth rib  . Esophageal dilation    . Colonoscopy    . Upper gastrointestinal endoscopy    . Hip arthroplasty Left 06/03/2016    Procedure: ARTHROPLASTY BIPOLAR HIP LEFT  (HEMIARTHROPLASTY);  Surgeon: Paralee Cancel, MD;  Location: [facility];  Service: Orthopedics;  Laterality: Left;      Medication List       This list is accurate as of: 06/25/16 11:59 PM.  Always use your most recent med list.               acetaminophen 325 MG tablet  Commonly known as:  TYLENOL  Take 2 tablets (650 mg total) by mouth every 6 (six) hours as needed for mild pain, fever or headache.     aspirin EC 81 MG tablet  Take 81 mg by mouth every morning.     B-12 2500 MCG Subl  Place 2,500 mcg under the tongue every morning.     bacitracin-polymyxin b ophthalmic ointment  Commonly known as:  POLYSPORIN  Place 1 application into both eyes 4 (four) times daily.     Cholecalciferol 5000 units Tabs  Take 5,000 Units by mouth every morning.     dexamethasone 4 MG tablet  Commonly known as:  DECADRON  Take 40 mg by mouth once a week. Take 10 tablets by mouth once weekly on Mondays     docusate sodium 100 MG capsule  Commonly known as:  COLACE  Take 1 capsule (100 mg total) by mouth 2 (two) times daily.     feeding supplement (ENSURE ENLIVE) Liqd  Take 237 mLs by mouth 2 (two) times daily between meals.     ferrous sulfate 325 (65 FE)  MG tablet  Take 1 tablet (325 mg total) by mouth 3 (three) times daily after meals.     FLOMAX 0.4 MG Caps capsule  Generic drug:  tamsulosin  Take 0.4 mg by mouth daily after breakfast.     HYDROcodone-acetaminophen 5-325 MG tablet  Commonly known as:  NORCO/VICODIN  Take 1-2 tablets by mouth every 8 (eight) hours as needed for severe pain.     losartan-hydrochlorothiazide 50-12.5 MG tablet  Commonly known as:  HYZAAR  Take 1 tablet by mouth daily.     multivitamin with minerals Tabs tablet  Take 1 tablet by mouth every morning.     pantoprazole 40 MG tablet  Commonly known as:  PROTONIX  Take 1 tablet (40 mg total) by mouth daily.     POMALYST 3 MG capsule  Generic drug:  pomalidomide  Take 3 mg by mouth daily. Reported on 06/25/2016     QUEtiapine 25 MG tablet  Commonly known as:  SEROQUEL  Take 1 tablet (25 mg total) by mouth at bedtime. To be given as part of treatment for acquired delirium; stop once mentation back to baseline     rOPINIRole 0.25 MG tablet  Commonly known as:  REQUIP  Take 1-2 tablet by mouth once daily at bedtime As needed for restless legs     zolendronic acid 4 mg in sodium chloride 0.9 % 100 mL  Inject into the vein. Is receiving treatment through [facility] next infusion due 06/20/16        No orders of the defined types were placed in this encounter.    Immunization History  Administered Date(s) Administered  . Influenza Split 10/01/2011, 09/22/2012  . Influenza,inj,Quad PF,36+ Mos 09/08/2013, 08/29/2014, 08/16/2015  . PPD Test 06/05/2016, 06/21/2016  . Pneumococcal Polysaccharide-23 08/09/2010    Social History  Substance Use Topics  . Smoking status: Former Smoker    Types: Cigarettes    Quit date: 12/09/1993  . Smokeless tobacco: Former Systems developer     Comment: reports that he quit smoking about 22 years ago  . Alcohol Use: No    Filed Vitals:   06/25/16 1059  BP: 133/66  Pulse: 114  Temp: 97.5 F (36.4 C)  Resp: 18     Physical Exam  GENERAL APPEARANCE: Alert. No acute distress.  HEENT: Unremarkable. RESPIRATORY: Breathing is even, unlabored. Lung sounds are clear   CARDIOVASCULAR: Heart RRR 3/6  Murmur  no, rubs or gallops. No peripheral edema.  GASTROINTESTINAL: Abdomen is soft, non-tender, not distended w/ normal bowel sounds.  NEUROLOGIC: Cranial nerves 2-12 grossly intact. Moves all extremities  Patient Active Problem List   Diagnosis Date Noted  . BPH (benign prostatic hypertrophy) 06/15/2016  . Restless leg syndrome 06/15/2016  . Edema 06/13/2016  . Altered mental status   . Delirium   . Hyponatremia 06/03/2016  . Left displaced femoral neck fracture ([place]) 06/02/2016  . Hip fracture ([place]) 06/02/2016  . Hordeolum externum (stye) 05/28/2016  . Hypokalemia 05/28/2016  . Dehydration 05/28/2016  . Pancytopenia ([place]) 05/28/2016  . Hyperbilirubinemia 05/28/2016  . Cancer of true vocal cord ([place])   . Multiple myeloma ([place]) 06/23/2012  . Vocal cord cancer ([place]) 12/24/2011  . Prostate cancer ([place]) 12/24/2011  . Multiple myeloma ([place]) 12/23/2011  . Essential hypertension, benign 10/10/2010  . ESOPHAGEAL STRICTURE 08/28/2010  . CHOLELITHIASIS 08/28/2010  . DYSPHAGIA UNSPECIFIED 08/28/2010  . ABNORMAL FINDINGS GI TRACT 08/28/2010  . GERD 08/23/2010  . CHOLELITHIASIS 08/23/2010  . HYPERTENSION, PULMONARY 08/10/2010  . ATRIAL FIBRILLATION 08/10/2010  . MURMUR 08/10/2010    CBC    Component Value Date/Time   WBC 3.1* 06/18/2016 0925   WBC 3.1 06/18/2016   WBC 6.5 06/05/2016 0430   RBC 2.54* 06/18/2016 0925   RBC 2.38* 06/05/2016 0430   HGB 8.2* 06/18/2016 0925   HGB 8.2* 06/18/2016   HCT 24.3* 06/18/2016 0925   HCT 24* 06/18/2016   PLT 179 06/18/2016 0925   PLT 179 06/18/2016   MCV 95.4 06/18/2016 0925   MCV 93.7 06/05/2016 0430   LYMPHSABS 0.4* 06/18/2016 0925   LYMPHSABS 0.6* 06/02/2016 0325   MONOABS 0.5 06/18/2016 0925   MONOABS 0.6 06/02/2016 0325   EOSABS 0.0  06/18/2016 0925   EOSABS 0.0 06/02/2016 0325   BASOSABS 0.0 06/18/2016 0925   BASOSABS 0.0 06/02/2016 0325    CMP     Component Value Date/Time   NA 136 06/18/2016 0925   NA 136* 06/18/2016   NA 132* 06/05/2016 0430   K 3.8 06/18/2016 0925   K 3.8 06/18/2016   CL 104 06/05/2016 0430   CL 102 04/26/2013 0913   CO2 23 06/18/2016 0925   CO2 23 06/05/2016 0430   GLUCOSE 104 06/18/2016 0925   GLUCOSE 133* 06/05/2016 0430   GLUCOSE 116* 04/26/2013 0913   BUN 41.5* 06/18/2016 0925   BUN 42* 06/18/2016   BUN 27* 06/05/2016 0430   CREATININE 1.3 06/18/2016 0925   CREATININE 1.3 06/18/2016   CREATININE 0.76 06/05/2016 0430   CALCIUM 9.4 06/18/2016 0925   CALCIUM 8.4* 06/05/2016 0430   PROT 5.8* 06/18/2016 0925   PROT 6.1* 05/16/2016 1845   ALBUMIN 3.1* 06/18/2016 0925   ALBUMIN 3.8 05/16/2016 1845   AST 16 06/18/2016 0925   AST 16 06/18/2016   ALT 20 06/18/2016 0925   ALT 20 06/18/2016   ALKPHOS 62 06/18/2016 0925   ALKPHOS 62 06/18/2016   BILITOT 1.11 06/18/2016 0925   BILITOT 1.1 05/16/2016 1845   GFRNONAA >60 06/05/2016 0430   GFRAA >60 06/05/2016 0430    Assessment and Plan  Pt is d/c to home with HH/OT/PT/Nursing. meications have been reconciled and Rx's written/\.   Time spent > 30 min;> 50% of time with patient was spent reviewing records, labs, tests and studies, counseling and developing plan of care  Noah Delaine. Sheppard Coil, MD

## 2016-06-28 ENCOUNTER — Telehealth: Payer: Self-pay | Admitting: *Deleted

## 2016-06-28 NOTE — Telephone Encounter (Signed)
Call from patient's daughter Clyda Hurdle requesting "appointment in [facility] For Dad.  He was released from rehab last night after a fall and broke hip on 06-01-2016.  Has a cough with yellow phlegm and sound more raspy when he breaths.  Finished Levaquin yesterday or the day before.  He has cancer so I'm on my way so he can be seen.  Have not checked fever and we're in the car."  Informed her I will call her back with instructions after I notify DrBenay Spice  Verbal order received and read back from Dr. Benay Spice for call to office if fever or s.o.b.  Informed patient is en route and further orders for [facility]  Called [facility] with orders to come in.  Has "turned around.  No shortness of breath and denies fever.  He feels cold wearing sweat shirts.  I'll check temperature when we get home and call back if needed.  He was on azithromycin for sore throat and could hardly talk in Rehab.  That didn't work is why the Levaquin was ordered."   Awaiting return call to work in today if needed

## 2016-07-01 NOTE — Telephone Encounter (Signed)
LM for rtn call if pt needs to be seen in [facility].

## 2016-07-02 ENCOUNTER — Ambulatory Visit: Payer: Medicare Other | Attending: Orthopedic Surgery | Admitting: Physical Therapy

## 2016-07-02 ENCOUNTER — Encounter: Payer: Self-pay | Admitting: Physical Therapy

## 2016-07-02 DIAGNOSIS — M25552 Pain in left hip: Secondary | ICD-10-CM | POA: Diagnosis not present

## 2016-07-02 DIAGNOSIS — R296 Repeated falls: Secondary | ICD-10-CM | POA: Diagnosis present

## 2016-07-02 DIAGNOSIS — R262 Difficulty in walking, not elsewhere classified: Secondary | ICD-10-CM

## 2016-07-02 NOTE — Therapy (Signed)
[place], [place], [address] Phone: [phone]   Fax:  [phone]  Physical Therapy Evaluation  Patient Details  Name: Richard [name] MRN: [id number] Date of Birth: 09/10/1933 Referring Provider: Alvan Dame  Encounter Date: 07/02/2016      PT End of Session - 07/02/16 1610    Visit Number 1   Date for PT Re-Evaluation 09/02/16   PT Start Time 1520   PT Stop Time 1615   PT Time Calculation (min) 55 min   Activity Tolerance Patient tolerated treatment well   Behavior During Therapy [facility] for tasks assessed/performed      Past Medical History:  Diagnosis Date  . Abnormal finding on GI tract imaging   . Anemia    pt's daughter denies  . Arrhythmia    atrial fibrillation  . Arthritis   . Atrial fibrillation ([place])   . 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 Cataract    bil removed  . Cholelithiasis   . Cholelithiasis   . Dehydration   . Dyslipidemia   . Dysphagia following unspecified cerebrovascular disease   . Esophageal stricture   . Esophageal stricture   . Essential hypertension, benign   . Facial basal cell cancer    nose  . GERD (gastroesophageal reflux disease)   . GERD (gastroesophageal reflux disease)   . Hiatal hernia   . History of BPH   . History of radiation therapy 01/29/11 -03/15/11   larynx 6600 cGy 33 sessions  . Hordeolum externum (stye)   . Hyperbilirubinemia   . Hypertension   . Hypokalemia   . Hyponatremia   . Left displaced femoral neck fracture ([place])   . Multiple myeloma   . Murmur   . Pancytopenia ([place])   . Pneumonia   . Prostate cancer ([place]) 01/23/11   Gleason 3+3=6  . Pulmonary hypertension ([place])     Past Surgical History:  Procedure Laterality Date  . APPENDECTOMY    . BACK SURGERY    . COLONOSCOPY    . ESOPHAGEAL DILATION    . HIP ARTHROPLASTY Left 06/03/2016   Procedure: ARTHROPLASTY BIPOLAR HIP LEFT  (HEMIARTHROPLASTY);  Surgeon:  Paralee Cancel, MD;  Location: [facility];  Service: Orthopedics;  Laterality: Left;  . HIP SURGERY    . right chest wall resection     of second ,third,and fourth rib  . TONSILLECTOMY    . trigeminal neuralgia    . UPPER GASTROINTESTINAL ENDOSCOPY      There were no vitals filed for this visit.       Subjective Assessment - 07/02/16 1527    Subjective Patient reports that he had a left hip hemiarthroplasty on 06/03/16 due to a fall on 06/02/16.  He reports that was doing pretty well until the past week and reports that he may have overdone it recently.  He reports being very tender in the left adductor mms, is a multiple myeloma patient and the medications have cause memory issues and the daughter reports that after surgery the memory issues are even worse.  Daughter is the historian as the patient left out many things.  After the surgery he was in the nursing center [facility] for [facility].     Limitations Standing;Walking;House hold activities   Patient Stated Goals have less pain and move better   Currently in Pain? Yes   Pain Score 7    Pain Location Hip  adductor origin   Pain Orientation Left   Pain Descriptors / Indicators Sore   Pain Type Surgical pain   Pain Onset 1 to 4 weeks ago   Pain Frequency Intermittent   Aggravating Factors  walking ands standing the pain will be up to 8-9/10   Pain Relieving Factors rest the pain can be 0/10   Effect of Pain on Daily Activities really hurts and limits me            [facility] PT Assessment - 07/02/16 0001      Assessment   Medical Diagnosis s/p left hemiarthroplasty of the hip   Referring Provider Alvan Dame   Onset Date/Surgical Date 06/03/16     Precautions   Precautions None     Balance Screen   Has the patient fallen in the past 6 months Yes   How many times? 2   Has the patient had a decrease in activity level because of a fear of falling?  No   Is the patient reluctant to leave their home because of a fear of falling?  No      Home Environment   Additional Comments lives alone, now is living with daughter, did not use an assistive device.  a ramp, reports light housework.     Prior Function   Level of Independence Independent   Vocation Retired   Leisure no exercise     ROM / Strength   AROM / PROM / Strength AROM;Strength     AROM   AROM Assessment Site Hip   Right/Left Hip Left   Left Hip Extension 5   Left Hip Flexion 60  with pain   Left Hip External Rotation  5  with pain   Left Hip Internal Rotation  5  with pain   Left Hip ABduction 10  with pain      Strength   Overall Strength Comments left hip 4-/5 with some pain in the left groin     Palpation   Palpation comment very tender in the left anterior hip and in the adductor origin     Transfers   Comments has to use hands, unstable with standing     Ambulation/Gait   Gait Comments uses a FWW, very slow, antlagic step to gait on the left.  shufling gait     Standardized Balance Assessment   Standardized Balance Assessment Timed Up and Go Test     Timed Up and Go Test   Normal TUG (seconds) 45                   [facility] Adult PT Treatment/Exercise - 07/02/16 0001      Exercises   Exercises Knee/Hip     Knee/Hip Exercises: Aerobic   Nustep level 5 x 5 minutes     Knee/Hip Exercises: Machines for Strengthening   Cybex Leg Press 20# 2x10, then no weight 10x wiht the left leg only                PT Education - 07/02/16 1610    Education provided Yes   Education Details standing marching and 3 way kicks   Person(s) Educated Patient;Child(ren)   Methods Explanation;Demonstration;Handout   Comprehension Verbalized understanding          PT Short Term Goals - 07/02/16 1645      PT SHORT TERM GOAL #2   Title independent with intial HEP   Time 2   Period Weeks   Status New  PT Long Term Goals - 07/02/16 1646      PT LONG TERM GOAL #1   Title decrease pain 50%   Time 8   Period Weeks    Status New     PT LONG TERM GOAL #2   Title decrease TUG time to 25 seconds   Time 8   Period Weeks   Status New     PT LONG TERM GOAL #3   Title walk with SPC 300 feet   Time 8   Period Weeks   Status New     PT LONG TERM GOAL #4   Title go up and down stairs step over step   Time 8   Period Weeks   Status New     PT LONG TERM GOAL #5   Title increase left hip strength to 4+/5   Time 8   Period Weeks   Status New               Plan - 07/02/16 1611    Clinical Impression Statement (P)  Patient had a fall on 06/03/15, he underwent a left hip hemiarthroplasty on 06/03/16.  He is a poor historian due to multiple myeloma medicaitons and reaction from anesthisia per his daughter.  He was with very small steps and a shuffling pattern, uses a FWW.  Balance is poor, Tug time was 40+ seconds   Rehab Potential (P)  Good   PT Frequency (P)  2x / week   PT Duration (P)  8 weeks   PT Treatment/Interventions (P)  ADLs/Self Care Home Management;Cryotherapy;Electrical Stimulation;Functional mobility training;Stair training;Gait training;Moist Heat;Therapeutic activities;Therapeutic exercise;Balance training;Neuromuscular re-education;Patient/family education;Manual techniques   Consulted and Agree with Plan of Care (P)  Patient      Patient will benefit from skilled therapeutic intervention in order to improve the following deficits and impairments:  (P) Abnormal gait, Cardiopulmonary status limiting activity, Decreased activity tolerance, Decreased balance, Decreased cognition, Decreased mobility, Decreased endurance, Decreased range of motion, Decreased strength, Difficulty walking, Impaired flexibility, Pain  Visit Diagnosis: Pain in left hip - Plan: PT plan of care cert/re-cert  Difficulty in walking, not elsewhere classified - Plan: PT plan of care cert/re-cert  Repeated falls - Plan: PT plan of care cert/re-cert      G-Codes - 29/47/65 1647    Functional Assessment  Tool Used foto 79% limitation   Functional Limitation Mobility: Walking and moving around   Mobility: Walking and Moving Around Current Status ([id number]) At least 60 percent but less than 80 percent impaired, limited or restricted       Problem List Patient Active Problem List   Diagnosis Date Noted  . BPH (benign prostatic hypertrophy) 06/15/2016  . Restless leg syndrome 06/15/2016  . Edema 06/13/2016  . Altered mental status   . Delirium   . Hyponatremia 06/03/2016  . Left displaced femoral neck fracture ([place]) 06/02/2016  . Hip fracture ([place]) 06/02/2016  . Hordeolum externum (stye) 05/28/2016  . Hypokalemia 05/28/2016  . Dehydration 05/28/2016  . Pancytopenia ([place]) 05/28/2016  . Hyperbilirubinemia 05/28/2016  . Cancer of true vocal cord ([place])   . Multiple myeloma ([place]) 06/23/2012  . Vocal cord cancer ([place]) 12/24/2011  . Prostate cancer ([place]) 12/24/2011  . Multiple myeloma ([place]) 12/23/2011  . Essential hypertension, benign 10/10/2010  . ESOPHAGEAL STRICTURE 08/28/2010  . CHOLELITHIASIS 08/28/2010  . DYSPHAGIA UNSPECIFIED 08/28/2010  . ABNORMAL FINDINGS GI TRACT 08/28/2010  . GERD 08/23/2010  . CHOLELITHIASIS 08/23/2010  . HYPERTENSION, PULMONARY 08/10/2010  . ATRIAL  FIBRILLATION 08/10/2010  . MURMUR 08/10/2010    [name]., PT 07/02/2016, 4:49 PM  [place], [place], [address] Phone: [phone]   Fax:  [phone]  Name: ALARIC GLADWIN MRN: [id number] Date of Birth: 05-16-1933

## 2016-07-03 ENCOUNTER — Ambulatory Visit (HOSPITAL_BASED_OUTPATIENT_CLINIC_OR_DEPARTMENT_OTHER): Payer: Medicare Other | Admitting: Oncology

## 2016-07-03 ENCOUNTER — Ambulatory Visit (HOSPITAL_BASED_OUTPATIENT_CLINIC_OR_DEPARTMENT_OTHER): Payer: Medicare Other

## 2016-07-03 ENCOUNTER — Telehealth: Payer: Self-pay | Admitting: Oncology

## 2016-07-03 VITALS — BP 154/79 | HR 101 | Temp 98.1°F | Resp 18 | Ht 68.0 in | Wt 167.5 lb

## 2016-07-03 DIAGNOSIS — R1030 Lower abdominal pain, unspecified: Secondary | ICD-10-CM

## 2016-07-03 DIAGNOSIS — C9002 Multiple myeloma in relapse: Secondary | ICD-10-CM

## 2016-07-03 LAB — CBC WITH DIFFERENTIAL/PLATELET
BASO%: 0.2 % (ref 0.0–2.0)
Basophils Absolute: 0 10*3/uL (ref 0.0–0.1)
EOS ABS: 0.1 10*3/uL (ref 0.0–0.5)
EOS%: 1.9 % (ref 0.0–7.0)
HEMATOCRIT: 28.5 % — AB (ref 38.4–49.9)
HGB: 9.2 g/dL — ABNORMAL LOW (ref 13.0–17.1)
LYMPH#: 0.3 10*3/uL — AB (ref 0.9–3.3)
LYMPH%: 6.3 % — ABNORMAL LOW (ref 14.0–49.0)
MCH: 30.8 pg (ref 27.2–33.4)
MCHC: 32.3 g/dL (ref 32.0–36.0)
MCV: 95.3 fL (ref 79.3–98.0)
MONO#: 0.3 10*3/uL (ref 0.1–0.9)
MONO%: 5.8 % (ref 0.0–14.0)
NEUT#: 4.1 10*3/uL (ref 1.5–6.5)
NEUT%: 85.8 % — AB (ref 39.0–75.0)
PLATELETS: 155 10*3/uL (ref 140–400)
RBC: 2.99 10*6/uL — ABNORMAL LOW (ref 4.20–5.82)
RDW: 17.6 % — ABNORMAL HIGH (ref 11.0–14.6)
WBC: 4.8 10*3/uL (ref 4.0–10.3)

## 2016-07-03 LAB — COMPREHENSIVE METABOLIC PANEL
ALT: 20 U/L (ref 0–55)
ANION GAP: 7 meq/L (ref 3–11)
AST: 16 U/L (ref 5–34)
Albumin: 2.9 g/dL — ABNORMAL LOW (ref 3.5–5.0)
Alkaline Phosphatase: 124 U/L (ref 40–150)
BILIRUBIN TOTAL: 0.42 mg/dL (ref 0.20–1.20)
BUN: 17 mg/dL (ref 7.0–26.0)
CALCIUM: 9 mg/dL (ref 8.4–10.4)
CHLORIDE: 98 meq/L (ref 98–109)
CO2: 27 mEq/L (ref 22–29)
CREATININE: 0.8 mg/dL (ref 0.7–1.3)
EGFR: 83 mL/min/{1.73_m2} — ABNORMAL LOW (ref >90)
Glucose: 97 mg/dl (ref 70–140)
Potassium: 3.6 mEq/L (ref 3.5–5.1)
Sodium: 133 mEq/L — ABNORMAL LOW (ref 136–145)
TOTAL PROTEIN: 5.6 g/dL — AB (ref 6.4–8.3)

## 2016-07-03 LAB — TECHNOLOGIST REVIEW

## 2016-07-03 MED ORDER — HYDROCODONE-ACETAMINOPHEN 5-325 MG PO TABS: 1.0000 | ORAL_TABLET | Freq: Three times a day (TID) | ORAL | 0 refills | Status: DC | PRN

## 2016-07-03 NOTE — Progress Notes (Signed)
[place] OFFICE PROGRESS NOTE   Diagnosis: Multiple myeloma  INTERVAL HISTORY:   Richard [name] returns as scheduled. He has been discharged from the skilled nursing facility. He stays with his daughter at night and is home during the day. His daughter reports this is working out well. He is participated in outpatient physical therapy. He developed a cough and wheezing last week. He was evaluated by his primary physician. His daughter reports he was diagnosed with bronchitis. No fever. He complains of pain in the left groin today.  Objective:  Vital signs in last 24 hours:  Blood pressure (!) 154/79, pulse (!) 101, temperature 98.1 F (36.7 C), temperature source Oral, resp. rate 18, height 5' 8"  (1.727 m), weight 167 lb 8 oz (76 kg), SpO2 98 %.    HEENT: No thrush or ulcers Resp: Bilateral expiratory rhonchi/wheezing-mild, no respiratory distress Cardio: Regular rate and rhythm GI: Nontender, no hepatosplenomegaly Vascular: No leg edema Musculoskeletal: No pain with motion at the left hip. Tender over the proximal medial tendons in the left groin. No mass. No apparent hernia.  Skin: Resolving ecchymosis at the right iliac area     Lab Results:  Lab Results  Component Value Date   WBC 3.1 (L) 06/18/2016   HGB 8.2 (L) 06/18/2016   HCT 24.3 (L) 06/18/2016   MCV 95.4 06/18/2016   PLT 179 06/18/2016   NEUTROABS 2.2 06/18/2016    06/18/2016: Lambda light chains 28.6   Imaging:  No results found.  Medications: I have reviewed the patient's current medications.  Assessment/Plan: 1. Multiple myeloma: The serum free light chains were elevated 08/29/2009. A CT of the chest 08/31/2009 showed a new pleural mass consistent with progression of multiple myeloma. He completed 12 cycles of Revlimid/Decadron. The serum free light chains were improved to 4.34 on 12/29/2009. A restaging CT of the chest 02/19/2010 showed resolution of right axillary lymphadenopathy and  a pleural-based mass. The serum free lambda light chains were again elevated.  Salvage Revlimid/Decadron initiated 12/08/2014  Cycle 2 01/05/2015  Serum light chains improved 01/24/2015  Cycle 3 02/01/2015  Cycle 4 03/02/2015  Cycle 5 03/30/2015  Cycle 6 04/27/2015  Cycle 7 05/25/2015  Cycle 8 06/22/2015  Cycle 9 07/20/2015  Cycle 10 08/18/2015  Changed to maintenance Revlimid beginning 09/23/2015  Progressive anemia and Elevated serum free light chains 11/13/2015  Changed to weekly Cytoxan/Velcade/Decadron beginning 11/21/2015, changed to 3/4 week schedule beginning 01/02/2016  Serum light chains improved on 03/26/2016  Serum light chains increased 04/23/2016  Serum light chains increased 05/21/2016  Cycle 1 pomalidomide/decadron 05/30/2016  Cycle 2 pomalidomide/Decadron 06/27/2016 2.History of back pain, likely related to the pleural-based mass at the right chest, resolved. 2. Right axillary/subpectoral lymphadenopathy on a CT of the chest 08/31/2009, likely related to multiple myeloma, resolved. 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 He is maintained on every three-month Zometa. 4. Pancytopenia secondary to Revlimid and multiple myeloma. He has persistent mild thrombocytopenia. 5. Hypertension. 6. Status post hip replacement. 7. History of back surgery. 8. History of trigeminal neuralgia. 9. Status post removal of a lipoma from the right axilla in February 2010. 10. 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 11. Esophageal reflux disease, followed by Dr. Janace Hoard.  12. Esophageal stricture, status post an evaluation by Dr. Henrene Pastor. 13. Right vocal cord lesion, status post a biopsy by Dr. 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 He completed radiation under the direction of Dr. Valere Dross on 03/15/2011. 14. Admission  with pneumonia 03/26/2010. 15. History of Atrial flutter/fibrillation while  hospitalized August 2011. He is followed by Dr. Percival Spanish. 16. Elevated prostate specific antigen, status post a biopsy 01/23/2011. He was found to have a Gleason 6 cancer in 10% of the cores. He is followed with an observation approach. He is now followed by Dr. Diona Fanti. 17. History of Mild hypercalcemia  18. Fall with a left wrist fracture June 2015  19. Right chest wall pain secondary to a right second rib plasmacytoma confirmed on a CT 11/28/2014, resolved 20. History of Hyponatremia 21. Thrombocytopenia secondary progression of multiple myeloma and chemotherapy 22. Fall with left femoral neck fracture 06/02/2016, left hemiarthroplasty 06/03/2016 23. Hospital delirium June 2017      Disposition:  Richard [name] is currently completing a second cycle of salvage therapy with pomalidomide and Decadron. He appears to be tolerating the treatment well. I suspect he currently has a viral upper respiratory infection. I have a low clinical suspicion for pneumonia.  The serum lambda light chains were improved on 06/18/2016. We will check labs as he leaves the office today.  Richard [name] will return for an office and lab visit prior to cycle 3. He will be due for Zometa in September.  He will see Dr. Alvan Dame if the left groin pain does not improve.  Betsy Coder, MD  07/03/2016  8:51 AM

## 2016-07-03 NOTE — Telephone Encounter (Signed)
Gave pt cal & avs °

## 2016-07-04 ENCOUNTER — Telehealth: Payer: Self-pay | Admitting: *Deleted

## 2016-07-04 ENCOUNTER — Encounter: Payer: Self-pay | Admitting: Physical Therapy

## 2016-07-04 ENCOUNTER — Ambulatory Visit: Payer: Medicare Other | Admitting: Physical Therapy

## 2016-07-04 DIAGNOSIS — R262 Difficulty in walking, not elsewhere classified: Secondary | ICD-10-CM

## 2016-07-04 DIAGNOSIS — R296 Repeated falls: Secondary | ICD-10-CM

## 2016-07-04 DIAGNOSIS — M25552 Pain in left hip: Secondary | ICD-10-CM

## 2016-07-04 LAB — KAPPA/LAMBDA LIGHT CHAINS
IG LAMBDA FREE LIGHT CHAIN: 17.1 mg/L (ref 5.7–26.3)
Ig Kappa Free Light Chain: 9.8 mg/L (ref 3.3–19.4)
Kappa/Lambda FluidC Ratio: 0.57 (ref 0.26–1.65)

## 2016-07-04 NOTE — Telephone Encounter (Signed)
Per Dr. Benay Spice, pt.'s daughter notified that light chains are better.  Richard [name] is appreciative of call and has no questions or concerns at this time.

## 2016-07-04 NOTE — Telephone Encounter (Signed)
-----   Message from Ladell Pier, MD sent at 07/04/2016  5:15 PM EDT ----- Please call daughter, light chains are better

## 2016-07-04 NOTE — Therapy (Signed)
[place], [place], [address] Phone: [phone]   Fax:  [phone]  Physical Therapy Treatment  Patient Details  Name: Richard [name] MRN: [id number] Date of Birth: Apr 27, 1933 Referring Provider: Alvan Dame  Encounter Date: 07/04/2016      PT End of Session - 07/04/16 0829    Visit Number 2   Date for PT Re-Evaluation 09/02/16   PT Start Time 0748   PT Stop Time 0842   PT Time Calculation (min) 54 min   Activity Tolerance Patient tolerated treatment well   Behavior During Therapy [facility] for tasks assessed/performed      Past Medical History:  Diagnosis Date  . Abnormal finding on GI tract imaging   . Anemia    pt's daughter denies  . Arrhythmia    atrial fibrillation  . Arthritis   . Atrial fibrillation ([place])   . Cancer of true vocal cord ([place]) 01/10/2011   right  . Cataract    bil removed  . Cholelithiasis   . Cholelithiasis   . Dehydration   . Dyslipidemia   . Dysphagia following unspecified cerebrovascular disease   . Esophageal stricture   . Esophageal stricture   . Essential hypertension, benign   . Facial basal cell cancer    nose  . GERD (gastroesophageal reflux disease)   . GERD (gastroesophageal reflux disease)   . Hiatal hernia   . History of BPH   . History of radiation therapy 01/29/11 -03/15/11   larynx 6600 cGy 33 sessions  . Hordeolum externum (stye)   . Hyperbilirubinemia   . Hypertension   . Hypokalemia   . Hyponatremia   . Left displaced femoral neck fracture ([place])   . Multiple myeloma   . Murmur   . Pancytopenia ([place])   . Pneumonia   . Prostate cancer ([place]) 01/23/11   Gleason 3+3=6  . Pulmonary hypertension ([place])     Past Surgical History:  Procedure Laterality Date  . APPENDECTOMY    . BACK SURGERY    . COLONOSCOPY    . ESOPHAGEAL DILATION    . HIP ARTHROPLASTY Left 06/03/2016   Procedure: ARTHROPLASTY BIPOLAR HIP LEFT  (HEMIARTHROPLASTY);  Surgeon:  Paralee Cancel, MD;  Location: [facility];  Service: Orthopedics;  Laterality: Left;  . HIP SURGERY    . right chest wall resection     of second ,third,and fourth rib  . TONSILLECTOMY    . trigeminal neuralgia    . UPPER GASTROINTESTINAL ENDOSCOPY      There were no vitals filed for this visit.      Subjective Assessment - 07/04/16 0753    Subjective Reports up most of the night with bronchitis.  Saw MD yesterday and he felt that he pulled a mm in the groin.   Currently in Pain? Yes   Pain Score 6    Pain Location Groin   Pain Orientation Left                         [facility] Adult PT Treatment/Exercise - 07/04/16 0001      Transfers   Comments cues to transfer correctly, he tends to want to pull up using walker and not push up     Knee/Hip Exercises: Aerobic   Nustep level 3 x 6 minutes     Knee/Hip Exercises: Machines for Strengthening   Cybex Knee Extension 5# 3x10   Cybex Knee Flexion 20# 3x10  Cybex Leg Press 20# 2x10, then no weight 10x wiht the left leg only     Knee/Hip Exercises: Seated   Ball Squeeze gentle x 25     Knee/Hip Exercises: Supine   Quad Sets 20 reps;Both   Other Supine Knee/Hip Exercises feet on ball K2C, trunk rotation, and small bridges   Other Supine Knee/Hip Exercises glute squeeze     Modalities   Modalities Electrical Stimulation;Moist Heat     Moist Heat Therapy   Number Minutes Moist Heat 15 Minutes   Moist Heat Location Hip     Electrical Stimulation   Electrical Stimulation Location left groin   Electrical Stimulation Action IFC   Electrical Stimulation Parameters supine   Electrical Stimulation Goals Pain                  PT Short Term Goals - 07/02/16 1645      PT SHORT TERM GOAL #2   Title independent with intial HEP   Time 2   Period Weeks   Status New           PT Long Term Goals - 07/02/16 1646      PT LONG TERM GOAL #1   Title decrease pain 50%   Time 8   Period Weeks   Status New      PT LONG TERM GOAL #2   Title decrease TUG time to 25 seconds   Time 8   Period Weeks   Status New     PT LONG TERM GOAL #3   Title walk with SPC 300 feet   Time 8   Period Weeks   Status New     PT LONG TERM GOAL #4   Title go up and down stairs step over step   Time 8   Period Weeks   Status New     PT LONG TERM GOAL #5   Title increase left hip strength to 4+/5   Time 8   Period Weeks   Status New               Plan - 07/04/16 0830    Clinical Impression Statement Patient with bronchitis that is limiting his rest, his left groin continues to be painful, MD reports "pulled mm".  He tolerates activity well if he goes slow and does not move the left hip into abduction with fast movements.  Did report mm fatigue with todays exercise.  He is impulsive and tends to be unsafe with transfers requiring cues   Rehab Potential Good   PT Frequency 2x / week   PT Duration 8 weeks   PT Treatment/Interventions ADLs/Self Care Home Management;Electrical Stimulation;Moist Heat;Gait training;Functional mobility training;Patient/family education;Therapeutic exercise;Therapeutic activities;Balance training;Manual techniques   PT Next Visit Plan slowly work on building up the strength and his function   Consulted and Agree with Plan of Care Patient      Patient will benefit from skilled therapeutic intervention in order to improve the following deficits and impairments:  Abnormal gait, Decreased activity tolerance, Cardiopulmonary status limiting activity, Decreased balance, Decreased mobility, Decreased endurance, Decreased range of motion, Decreased strength, Difficulty walking, Impaired flexibility, Pain  Visit Diagnosis: Pain in left hip  Difficulty in walking, not elsewhere classified  Repeated falls     Problem List Patient Active Problem List   Diagnosis Date Noted  . BPH (benign prostatic hypertrophy) 06/15/2016  . Restless leg syndrome 06/15/2016  . Edema  06/13/2016  . Altered mental status   .  Delirium   . Hyponatremia 06/03/2016  . Left displaced femoral neck fracture ([place]) 06/02/2016  . Hip fracture ([place]) 06/02/2016  . Hordeolum externum (stye) 05/28/2016  . Hypokalemia 05/28/2016  . Dehydration 05/28/2016  . Pancytopenia ([place]) 05/28/2016  . Hyperbilirubinemia 05/28/2016  . Cancer of true vocal cord ([place])   . Multiple myeloma ([place]) 06/23/2012  . Vocal cord cancer ([place]) 12/24/2011  . Prostate cancer ([place]) 12/24/2011  . Multiple myeloma ([place]) 12/23/2011  . Essential hypertension, benign 10/10/2010  . ESOPHAGEAL STRICTURE 08/28/2010  . CHOLELITHIASIS 08/28/2010  . DYSPHAGIA UNSPECIFIED 08/28/2010  . ABNORMAL FINDINGS GI TRACT 08/28/2010  . GERD 08/23/2010  . CHOLELITHIASIS 08/23/2010  . HYPERTENSION, PULMONARY 08/10/2010  . ATRIAL FIBRILLATION 08/10/2010  . MURMUR 08/10/2010    [name]., PT 07/04/2016, 8:34 AM  [place], [place], [address] Phone: [phone]   Fax:  [phone]  Name: ISAIYAH FELDHAUS MRN: [id number] Date of Birth: [birth year]

## 2016-07-09 ENCOUNTER — Encounter: Payer: Self-pay | Admitting: Physical Therapy

## 2016-07-09 ENCOUNTER — Ambulatory Visit: Payer: Medicare Other | Attending: Orthopedic Surgery | Admitting: Physical Therapy

## 2016-07-09 DIAGNOSIS — R262 Difficulty in walking, not elsewhere classified: Secondary | ICD-10-CM

## 2016-07-09 DIAGNOSIS — M25552 Pain in left hip: Secondary | ICD-10-CM | POA: Diagnosis present

## 2016-07-09 DIAGNOSIS — R296 Repeated falls: Secondary | ICD-10-CM | POA: Insufficient documentation

## 2016-07-09 NOTE — Therapy (Signed)
[place], [place], [address] Phone: [phone]   Fax:  [phone]  Physical Therapy Treatment  Patient Details  Name: Richard [name] MRN: [id number] Date of Birth: 27-Sep-1933 Referring Provider: Alvan Dame  Encounter Date: 07/09/2016      PT End of Session - 07/09/16 0920    Visit Number 3   Date for PT Re-Evaluation 09/02/16   PT Start Time 0849   PT Stop Time 0956   PT Time Calculation (min) 67 min   Activity Tolerance Patient tolerated treatment well   Behavior During Therapy [facility] for tasks assessed/performed      Past Medical History:  Diagnosis Date  . Abnormal finding on GI tract imaging   . Anemia    pt's daughter denies  . Arrhythmia    atrial fibrillation  . Arthritis   . Atrial fibrillation ([place])   . Cancer of true vocal cord ([place]) 01/10/2011   right  . Cataract    bil removed  . Cholelithiasis   . Cholelithiasis   . Dehydration   . Dyslipidemia   . Dysphagia following unspecified cerebrovascular disease   . Esophageal stricture   . Esophageal stricture   . Essential hypertension, benign   . Facial basal cell cancer    nose  . GERD (gastroesophageal reflux disease)   . GERD (gastroesophageal reflux disease)   . Hiatal hernia   . History of BPH   . History of radiation therapy 01/29/11 -03/15/11   larynx 6600 cGy 33 sessions  . Hordeolum externum (stye)   . Hyperbilirubinemia   . Hypertension   . Hypokalemia   . Hyponatremia   . Left displaced femoral neck fracture ([place])   . Multiple myeloma   . Murmur   . Pancytopenia ([place])   . Pneumonia   . Prostate cancer ([place]) 01/23/11   Gleason 3+3=6  . Pulmonary hypertension ([place])     Past Surgical History:  Procedure Laterality Date  . APPENDECTOMY    . BACK SURGERY    . COLONOSCOPY    . ESOPHAGEAL DILATION    . HIP ARTHROPLASTY Left 06/03/2016   Procedure: ARTHROPLASTY BIPOLAR HIP LEFT  (HEMIARTHROPLASTY);  Surgeon:  Paralee Cancel, MD;  Location: [facility];  Service: Orthopedics;  Laterality: Left;  . HIP SURGERY    . right chest wall resection     of second ,third,and fourth rib  . TONSILLECTOMY    . trigeminal neuralgia    . UPPER GASTROINTESTINAL ENDOSCOPY      There were no vitals filed for this visit.      Subjective Assessment - 07/09/16 0903    Subjective Reports feeling better since last visit   Currently in Pain? Yes   Pain Score 2    Pain Location Groin   Pain Orientation Left   Aggravating Factors  moving the left leg out   Pain Relieving Factors maybe the treatment last time helped                         [facility] Adult PT Treatment/Exercise - 07/09/16 0001      Knee/Hip Exercises: Aerobic   Stationary Bike 5 minutes   Nustep level 4 x 6 minutes     Knee/Hip Exercises: Machines for Strengthening   Cybex Knee Extension 5# 3x10, then 5# left leg only   Cybex Knee Flexion 25# 3x10, 15# left leg only   Cybex Leg Press 20#  2x15, then no weight 10x wiht the left leg only     Knee/Hip Exercises: Seated   Ball Squeeze gentle x 25     Knee/Hip Exercises: Supine   Other Supine Knee/Hip Exercises feet on ball K2C, trunk rotation, and small bridges   Other Supine Knee/Hip Exercises glute squeeze     Moist Heat Therapy   Number Minutes Moist Heat 15 Minutes   Moist Heat Location Hip     Electrical Stimulation   Electrical Stimulation Location left groin   Electrical Stimulation Action IFC   Electrical Stimulation Parameters supine   Electrical Stimulation Goals Pain                  PT Short Term Goals - 07/09/16 3295      PT SHORT TERM GOAL #2   Title independent with intial HEP   Status Achieved           PT Long Term Goals - 07/02/16 1646      PT LONG TERM GOAL #1   Title decrease pain 50%   Time 8   Period Weeks   Status New     PT LONG TERM GOAL #2   Title decrease TUG time to 25 seconds   Time 8   Period Weeks   Status New      PT LONG TERM GOAL #3   Title walk with SPC 300 feet   Time 8   Period Weeks   Status New     PT LONG TERM GOAL #4   Title go up and down stairs step over step   Time 8   Period Weeks   Status New     PT LONG TERM GOAL #5   Title increase left hip strength to 4+/5   Time 8   Period Weeks   Status New               Plan - 07/09/16 0921    Clinical Impression Statement Patient feeling better than last week, at times he is still unsafe with pulling up on walker instead of pushing up from sitting surface   PT Next Visit Plan slowly work on building up the strength and his function   Consulted and Agree with Plan of Care Patient      Patient will benefit from skilled therapeutic intervention in order to improve the following deficits and impairments:  Abnormal gait, Decreased activity tolerance, Cardiopulmonary status limiting activity, Decreased balance, Decreased mobility, Decreased endurance, Decreased range of motion, Decreased strength, Difficulty walking, Impaired flexibility, Pain  Visit Diagnosis: Pain in left hip  Difficulty in walking, not elsewhere classified  Repeated falls     Problem List Patient Active Problem List   Diagnosis Date Noted  . BPH (benign prostatic hypertrophy) 06/15/2016  . Restless leg syndrome 06/15/2016  . Edema 06/13/2016  . Altered mental status   . Delirium   . Hyponatremia 06/03/2016  . Left displaced femoral neck fracture ([place]) 06/02/2016  . Hip fracture ([place]) 06/02/2016  . Hordeolum externum (stye) 05/28/2016  . Hypokalemia 05/28/2016  . Dehydration 05/28/2016  . Pancytopenia ([place]) 05/28/2016  . Hyperbilirubinemia 05/28/2016  . Cancer of true vocal cord ([place])   . Multiple myeloma ([place]) 06/23/2012  . Vocal cord cancer ([place]) 12/24/2011  . Prostate cancer ([place]) 12/24/2011  . Multiple myeloma ([place]) 12/23/2011  . Essential hypertension, benign 10/10/2010  . ESOPHAGEAL STRICTURE 08/28/2010  . CHOLELITHIASIS 08/28/2010  .  DYSPHAGIA UNSPECIFIED 08/28/2010  . ABNORMAL FINDINGS  GI TRACT 08/28/2010  . GERD 08/23/2010  . CHOLELITHIASIS 08/23/2010  . HYPERTENSION, PULMONARY 08/10/2010  . ATRIAL FIBRILLATION 08/10/2010  . MURMUR 08/10/2010    [name]., PT 07/09/2016, 9:31 AM  [place], [place], [address] Phone: [phone]   Fax:  [phone]  Name: Richard [name] MRN: [id number] Date of Birth: 07-23-1933

## 2016-07-12 ENCOUNTER — Ambulatory Visit: Payer: Medicare Other | Admitting: Physical Therapy

## 2016-07-12 ENCOUNTER — Encounter: Payer: Self-pay | Admitting: Physical Therapy

## 2016-07-12 DIAGNOSIS — M25552 Pain in left hip: Secondary | ICD-10-CM | POA: Diagnosis not present

## 2016-07-12 DIAGNOSIS — R262 Difficulty in walking, not elsewhere classified: Secondary | ICD-10-CM

## 2016-07-12 DIAGNOSIS — R296 Repeated falls: Secondary | ICD-10-CM

## 2016-07-12 NOTE — Therapy (Signed)
[place], [place], [address] Phone: [phone]   Fax:  [phone]  Physical Therapy Treatment  Patient Details  Name: Richard [name] MRN: [id number] Date of Birth: [birth year] Referring Provider: Alvan Dame  Encounter Date: 07/12/2016      PT End of Session - 07/12/16 0853    Visit Number 4   Date for PT Re-Evaluation 09/02/16   PT Start Time 0752   PT Stop Time 0856   PT Time Calculation (min) 64 min   Activity Tolerance Patient tolerated treatment well   Behavior During Therapy [facility] for tasks assessed/performed      Past Medical History:  Diagnosis Date  . Abnormal finding on GI tract imaging   . Anemia    pt's daughter denies  . Arrhythmia    atrial fibrillation  . Arthritis   . Atrial fibrillation ([place])   . Cancer of true vocal cord ([place]) 01/10/2011   right  . Cataract    bil removed  . Cholelithiasis   . Cholelithiasis   . Dehydration   . Dyslipidemia   . Dysphagia following unspecified cerebrovascular disease   . Esophageal stricture   . Esophageal stricture   . Essential hypertension, benign   . Facial basal cell cancer    nose  . GERD (gastroesophageal reflux disease)   . GERD (gastroesophageal reflux disease)   . Hiatal hernia   . History of BPH   . History of radiation therapy 01/29/11 -03/15/11   larynx 6600 cGy 33 sessions  . Hordeolum externum (stye)   . Hyperbilirubinemia   . Hypertension   . Hypokalemia   . Hyponatremia   . Left displaced femoral neck fracture ([place])   . Multiple myeloma   . Murmur   . Pancytopenia ([place])   . Pneumonia   . Prostate cancer ([place]) 01/23/11   Gleason 3+3=6  . Pulmonary hypertension ([place])     Past Surgical History:  Procedure Laterality Date  . APPENDECTOMY    . BACK SURGERY    . COLONOSCOPY    . ESOPHAGEAL DILATION    . HIP ARTHROPLASTY Left 06/03/2016   Procedure: ARTHROPLASTY BIPOLAR HIP LEFT  (HEMIARTHROPLASTY);  Surgeon:  Paralee Cancel, MD;  Location: [facility];  Service: Orthopedics;  Laterality: Left;  . HIP SURGERY    . right chest wall resection     of second ,third,and fourth rib  . TONSILLECTOMY    . trigeminal neuralgia    . UPPER GASTROINTESTINAL ENDOSCOPY      There were no vitals filed for this visit.      Subjective Assessment - 07/12/16 0750    Subjective Patient reports still with pain in the left groin, doing exercises at home   Currently in Pain? Yes   Pain Score 3    Pain Location Groin   Pain Orientation Left                         [facility] Adult PT Treatment/Exercise - 07/12/16 0001      Transfers   Comments (P)  still needing some cues for transfers due to unsafe and impulsive     High Level Balance   High Level Balance Activities (P)  Side stepping;Backward walking;Marching forwards   High Level Balance Comments (P)  ball tosses solid surface and airex standing     Knee/Hip Exercises: Aerobic   Stationary Bike (P)  5 minutes   Nustep (  P)  level 4 x 6 minutes     Knee/Hip Exercises: Machines for Strengthening   Cybex Knee Extension (P)  5# 3x10, then 5# left leg only   Cybex Knee Flexion (P)  25# 3x10, 15# left leg only   Cybex Leg Press (P)  20# 2x15, then no weight 10x wiht the left leg only                  PT Short Term Goals - 07/09/16 0677      PT SHORT TERM GOAL #2   Title independent with intial HEP   Status Achieved           PT Long Term Goals - 07/12/16 0854      PT LONG TERM GOAL #1   Title decrease pain 50%   Status Partially Met     PT LONG TERM GOAL #3   Title walk with SPC 300 feet   Status On-going               Plan - 07/12/16 0853    Clinical Impression Statement Patient with stiff legged gait bilaterally, needs cues to pick up feet and bend knees   PT Next Visit Plan slowly work on building up the strength and his function   Consulted and Agree with Plan of Care Patient      Patient will benefit  from skilled therapeutic intervention in order to improve the following deficits and impairments:  Abnormal gait, Decreased activity tolerance, Cardiopulmonary status limiting activity, Decreased balance, Decreased mobility, Decreased endurance, Decreased range of motion, Decreased strength, Difficulty walking, Impaired flexibility, Pain  Visit Diagnosis: Pain in left hip  Difficulty in walking, not elsewhere classified  Repeated falls     Problem List Patient Active Problem List   Diagnosis Date Noted  . BPH (benign prostatic hypertrophy) 06/15/2016  . Restless leg syndrome 06/15/2016  . Edema 06/13/2016  . Altered mental status   . Delirium   . Hyponatremia 06/03/2016  . Left displaced femoral neck fracture ([place]) 06/02/2016  . Hip fracture ([place]) 06/02/2016  . Hordeolum externum (stye) 05/28/2016  . Hypokalemia 05/28/2016  . Dehydration 05/28/2016  . Pancytopenia ([place]) 05/28/2016  . Hyperbilirubinemia 05/28/2016  . Cancer of true vocal cord ([place])   . Multiple myeloma ([place]) 06/23/2012  . Vocal cord cancer ([place]) 12/24/2011  . Prostate cancer ([place]) 12/24/2011  . Multiple myeloma ([place]) 12/23/2011  . Essential hypertension, benign 10/10/2010  . ESOPHAGEAL STRICTURE 08/28/2010  . CHOLELITHIASIS 08/28/2010  . DYSPHAGIA UNSPECIFIED 08/28/2010  . ABNORMAL FINDINGS GI TRACT 08/28/2010  . GERD 08/23/2010  . CHOLELITHIASIS 08/23/2010  . HYPERTENSION, PULMONARY 08/10/2010  . ATRIAL FIBRILLATION 08/10/2010  . MURMUR 08/10/2010    [name]., PT 07/12/2016, 8:55 AM  [place], [place], [address] Phone: [phone]   Fax:  [phone]  Name: MAGDIEL BARTLES MRN: [id number] Date of Birth: 1933-10-07

## 2016-07-16 ENCOUNTER — Ambulatory Visit: Payer: Medicare Other | Admitting: Physical Therapy

## 2016-07-16 ENCOUNTER — Encounter: Payer: Self-pay | Admitting: Physical Therapy

## 2016-07-16 DIAGNOSIS — M25552 Pain in left hip: Secondary | ICD-10-CM | POA: Diagnosis not present

## 2016-07-16 DIAGNOSIS — R262 Difficulty in walking, not elsewhere classified: Secondary | ICD-10-CM

## 2016-07-16 DIAGNOSIS — R296 Repeated falls: Secondary | ICD-10-CM

## 2016-07-16 NOTE — Therapy (Signed)
[place], [place], [address] Phone: [phone]   Fax:  [phone]  Physical Therapy Treatment  Patient Details  Name: Richard [name] MRN: [id number] Date of Birth: [birth year] Referring Provider: Alvan Dame  Encounter Date: 07/16/2016      PT End of Session - 07/16/16 0833    Visit Number 5   Date for PT Re-Evaluation 09/02/16   PT Start Time 0758   PT Stop Time 0900   PT Time Calculation (min) 62 min   Activity Tolerance Patient tolerated treatment well   Behavior During Therapy [facility] for tasks assessed/performed      Past Medical History:  Diagnosis Date  . Abnormal finding on GI tract imaging   . Anemia    pt's daughter denies  . Arrhythmia    atrial fibrillation  . Arthritis   . Atrial fibrillation ([place])   . 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 Cataract    bil removed  . Cholelithiasis   . Cholelithiasis   . Dehydration   . Dyslipidemia   . Dysphagia following unspecified cerebrovascular disease   . Esophageal stricture   . Esophageal stricture   . Essential hypertension, benign   . Facial basal cell cancer    nose  . GERD (gastroesophageal reflux disease)   . GERD (gastroesophageal reflux disease)   . Hiatal hernia   . History of BPH   . History of radiation therapy 01/29/11 -03/15/11   larynx 6600 cGy 33 sessions  . Hordeolum externum (stye)   . Hyperbilirubinemia   . Hypertension   . Hypokalemia   . Hyponatremia   . Left displaced femoral neck fracture ([place])   . Multiple myeloma   . Murmur   . Pancytopenia ([place])   . Pneumonia   . Prostate cancer ([place]) 01/23/11   Gleason 3+3=6  . Pulmonary hypertension ([place])     Past Surgical History:  Procedure Laterality Date  . APPENDECTOMY    . BACK SURGERY    . COLONOSCOPY    . ESOPHAGEAL DILATION    . HIP ARTHROPLASTY Left 06/03/2016   Procedure: ARTHROPLASTY BIPOLAR HIP LEFT  (HEMIARTHROPLASTY);  Surgeon:  Paralee Cancel, MD;  Location: [facility];  Service: Orthopedics;  Laterality: Left;  . HIP SURGERY    . right chest wall resection     of second ,third,and fourth rib  . TONSILLECTOMY    . trigeminal neuralgia    . UPPER GASTROINTESTINAL ENDOSCOPY      There were no vitals filed for this visit.      Subjective Assessment - 07/16/16 0800    Subjective I think the pain is getting better, I am doing my exercises at home   Currently in Pain? Yes   Pain Score 2    Pain Location Groin   Pain Orientation Left   Pain Relieving Factors the heat helps            [facility] PT Assessment - 07/16/16 0001      Timed Up and Go Test   Normal TUG (seconds) 29                     [facility] Adult PT Treatment/Exercise - 07/16/16 0001      High Level Balance   High Level Balance Activities Side stepping;Backward walking;Marching forwards   High Level Balance Comments ball tosses solid surface and airex standing     Knee/Hip  Exercises: Aerobic   Stationary Bike 5 minutes   Nustep level 4 x 6 minutes     Knee/Hip Exercises: Machines for Strengthening   Cybex Knee Extension 5# 3x10, then 5# left leg only   Cybex Knee Flexion 25# 3x10, 15# left leg only   Cybex Leg Press 20# 2x15, then no weight 10x wiht the left leg only   Other Machine 40 and 50# left leg press down with pulley     Knee/Hip Exercises: Seated   Ball Squeeze gentle x 25     Knee/Hip Exercises: Supine   Other Supine Knee/Hip Exercises feet on ball K2C, trunk rotation, and small bridges     Moist Heat Therapy   Number Minutes Moist Heat 15 Minutes   Moist Heat Location Hip     Electrical Stimulation   Electrical Stimulation Location left groin   Electrical Stimulation Action IFC   Electrical Stimulation Parameters supine   Electrical Stimulation Goals Pain                  PT Short Term Goals - 07/09/16 0922      PT SHORT TERM GOAL #2   Title independent with intial HEP   Status Achieved            PT Long Term Goals - 07/16/16 0835      PT LONG TERM GOAL #1   Title decrease pain 50%   Status Partially Met     PT LONG TERM GOAL #2   Title decrease TUG time to 25 seconds   Status Partially Met               Plan - 07/16/16 0833    Clinical Impression Statement Pain is getting better, has a very difficult time with dynamic surface standing, but did better today with this than last week, last week he wanted to stop due to not feeling safe   PT Next Visit Plan slowly work on building up the strength and his function   Consulted and Agree with Plan of Care Patient      Patient will benefit from skilled therapeutic intervention in order to improve the following deficits and impairments:  Abnormal gait, Decreased activity tolerance, Cardiopulmonary status limiting activity, Decreased balance, Decreased mobility, Decreased endurance, Decreased range of motion, Decreased strength, Difficulty walking, Impaired flexibility, Pain  Visit Diagnosis: Pain in left hip  Difficulty in walking, not elsewhere classified  Repeated falls     Problem List Patient Active Problem List   Diagnosis Date Noted  . BPH (benign prostatic hypertrophy) 06/15/2016  . Restless leg syndrome 06/15/2016  . Edema 06/13/2016  . Altered mental status   . Delirium   . Hyponatremia 06/03/2016  . Left displaced femoral neck fracture ([place]) 06/02/2016  . Hip fracture ([place]) 06/02/2016  . Hordeolum externum (stye) 05/28/2016  . Hypokalemia 05/28/2016  . Dehydration 05/28/2016  . Pancytopenia ([place]) 05/28/2016  . Hyperbilirubinemia 05/28/2016  . Cancer of true vocal cord ([place])   . Multiple myeloma ([place]) 06/23/2012  . Vocal cord cancer ([place]) 12/24/2011  . Prostate cancer ([place]) 12/24/2011  . Multiple myeloma ([place]) 12/23/2011  . Essential hypertension, benign 10/10/2010  . ESOPHAGEAL STRICTURE 08/28/2010  . CHOLELITHIASIS 08/28/2010  . DYSPHAGIA UNSPECIFIED 08/28/2010  . ABNORMAL  FINDINGS GI TRACT 08/28/2010  . GERD 08/23/2010  . CHOLELITHIASIS 08/23/2010  . HYPERTENSION, PULMONARY 08/10/2010  . ATRIAL FIBRILLATION 08/10/2010  . MURMUR 08/10/2010    [name]., PT 07/16/2016, 8:44 AM  [place], [place], [address] Phone: [phone]   Fax:  [phone]  Name: Richard [name] MRN: [id number] Date of Birth: October 13, 1933

## 2016-07-17 ENCOUNTER — Other Ambulatory Visit: Payer: Self-pay | Admitting: Family Medicine

## 2016-07-17 DIAGNOSIS — R479 Unspecified speech disturbances: Secondary | ICD-10-CM

## 2016-07-18 ENCOUNTER — Encounter: Payer: Self-pay | Admitting: Physical Therapy

## 2016-07-18 ENCOUNTER — Ambulatory Visit: Payer: Medicare Other | Admitting: Physical Therapy

## 2016-07-18 DIAGNOSIS — R296 Repeated falls: Secondary | ICD-10-CM

## 2016-07-18 DIAGNOSIS — R262 Difficulty in walking, not elsewhere classified: Secondary | ICD-10-CM

## 2016-07-18 DIAGNOSIS — M25552 Pain in left hip: Secondary | ICD-10-CM | POA: Diagnosis not present

## 2016-07-18 NOTE — Therapy (Signed)
[place], [place], [address] Phone: [phone]   Fax:  [phone]  Physical Therapy Treatment  Patient Details  Name: Richard [name] MRN: [id number] Date of Birth: 1933-10-09 Referring Provider: Alvan Dame  Encounter Date: 07/18/2016      PT End of Session - 07/18/16 0845    Visit Number 6   Date for PT Re-Evaluation 09/02/16   PT Start Time 0800   PT Stop Time 0904   PT Time Calculation (min) 64 min   Activity Tolerance Patient tolerated treatment well   Behavior During Therapy [facility] for tasks assessed/performed      Past Medical History:  Diagnosis Date  . Abnormal finding on GI tract imaging   . Anemia    pt's daughter denies  . Arrhythmia    atrial fibrillation  . Arthritis   . Atrial fibrillation ([place])   . 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 Cataract    bil removed  . Cholelithiasis   . Cholelithiasis   . Dehydration   . Dyslipidemia   . Dysphagia following unspecified cerebrovascular disease   . Esophageal stricture   . Esophageal stricture   . Essential hypertension, benign   . Facial basal cell cancer    nose  . GERD (gastroesophageal reflux disease)   . GERD (gastroesophageal reflux disease)   . Hiatal hernia   . History of BPH   . History of radiation therapy 01/29/11 -03/15/11   larynx 6600 cGy 33 sessions  . Hordeolum externum (stye)   . Hyperbilirubinemia   . Hypertension   . Hypokalemia   . Hyponatremia   . Left displaced femoral neck fracture ([place])   . Multiple myeloma   . Murmur   . Pancytopenia ([place])   . Pneumonia   . Prostate cancer ([place]) 01/23/11   Gleason 3+3=6  . Pulmonary hypertension ([place])     Past Surgical History:  Procedure Laterality Date  . APPENDECTOMY    . BACK SURGERY    . COLONOSCOPY    . ESOPHAGEAL DILATION    . HIP ARTHROPLASTY Left 06/03/2016   Procedure: ARTHROPLASTY BIPOLAR HIP LEFT  (HEMIARTHROPLASTY);  Surgeon:  Paralee Cancel, MD;  [facility];  Service: Orthopedics;  Laterality: Left;  . HIP SURGERY    . right chest wall resection     of second ,third,and fourth rib  . TONSILLECTOMY    . trigeminal neuralgia    . UPPER GASTROINTESTINAL ENDOSCOPY      There were no vitals filed for this visit.      Subjective Assessment - 07/18/16 0805    Subjective I almost fell yesterday, I tripped on a rug   Currently in Pain? Yes   Pain Score 2    Pain Location Groin   Pain Orientation Left   Aggravating Factors  trying to lift the left leg up to get in car                         [facility] Adult PT Treatment/Exercise - 07/18/16 0001      High Level Balance   High Level Balance Activities Side stepping;Backward walking;Marching forwards   High Level Balance Comments ball tosses solid surface and airex standing     Knee/Hip Exercises: Aerobic   Nustep level 5 x 6 minutes     Knee/Hip Exercises: Machines for Strengthening   Cybex Knee Extension 10# 3x10  Cybex Knee Flexion 25# 3x10, 15# left leg only   Cybex Leg Press 30# 2x15 then tried 20# with songle leg, unable to do it, went back to no weihgt with single legs.      Knee/Hip Exercises: Seated   Ball Squeeze gentle x 25   Sit to Sand without UE support;20 reps     Moist Heat Therapy   Number Minutes Moist Heat 15 Minutes   Moist Heat Location Hip     Electrical Stimulation   Electrical Stimulation Location left groin   Electrical Stimulation Action IFC   Electrical Stimulation Parameters supine   Electrical Stimulation Goals Pain                  PT Short Term Goals - 07/09/16 0922      PT SHORT TERM GOAL #2   Title independent with intial HEP   Status Achieved           PT Long Term Goals - 07/16/16 0835      PT LONG TERM GOAL #1   Title decrease pain 50%   Status Partially Met     PT LONG TERM GOAL #2   Title decrease TUG time to 25 seconds   Status Partially Met                Plan - 07/18/16 0845    Clinical Impression Statement Patient had some increased lethargy and some c/o dizziness, he reports not sleeping last night, he was stiff and more unsafe with gait, tried walking with a SPC and this seemed to be more of a hinderance and he really wanted to reach out and hold walls and doors   PT Next Visit Plan slowly work on building up the strength and his function   Consulted and Agree with Plan of Care Patient      Patient will benefit from skilled therapeutic intervention in order to improve the following deficits and impairments:  Abnormal gait, Decreased activity tolerance, Cardiopulmonary status limiting activity, Decreased balance, Decreased mobility, Decreased endurance, Decreased range of motion, Decreased strength, Difficulty walking, Impaired flexibility, Pain  Visit Diagnosis: Pain in left hip  Difficulty in walking, not elsewhere classified  Repeated falls     Problem List Patient Active Problem List   Diagnosis Date Noted  . BPH (benign prostatic hypertrophy) 06/15/2016  . Restless leg syndrome 06/15/2016  . Edema 06/13/2016  . Altered mental status   . Delirium   . Hyponatremia 06/03/2016  . Left displaced femoral neck fracture ([place]) 06/02/2016  . Hip fracture ([place]) 06/02/2016  . Hordeolum externum (stye) 05/28/2016  . Hypokalemia 05/28/2016  . Dehydration 05/28/2016  . Pancytopenia ([place]) 05/28/2016  . Hyperbilirubinemia 05/28/2016  . Cancer of true vocal cord ([place])   . Multiple myeloma ([place]) 06/23/2012  . Vocal cord cancer ([place]) 12/24/2011  . Prostate cancer ([place]) 12/24/2011  . Multiple myeloma ([place]) 12/23/2011  . Essential hypertension, benign 10/10/2010  . ESOPHAGEAL STRICTURE 08/28/2010  . CHOLELITHIASIS 08/28/2010  . DYSPHAGIA UNSPECIFIED 08/28/2010  . ABNORMAL FINDINGS GI TRACT 08/28/2010  . GERD 08/23/2010  . CHOLELITHIASIS 08/23/2010  . HYPERTENSION, PULMONARY 08/10/2010  . ATRIAL FIBRILLATION 08/10/2010  . MURMUR  08/10/2010    [name]., PT 07/18/2016, 8:48 AM  [facility] [place], [place], [address] Phone: [phone]   Fax:  [phone]  Name: KAMRYN GAUTHIER MRN: [id number] Date of Birth: 20-Mar-1933

## 2016-07-22 ENCOUNTER — Other Ambulatory Visit: Payer: Self-pay | Admitting: *Deleted

## 2016-07-22 ENCOUNTER — Encounter: Payer: Self-pay | Admitting: Physical Therapy

## 2016-07-22 ENCOUNTER — Ambulatory Visit (HOSPITAL_BASED_OUTPATIENT_CLINIC_OR_DEPARTMENT_OTHER): Payer: Medicare Other | Admitting: Nurse Practitioner

## 2016-07-22 ENCOUNTER — Telehealth: Payer: Self-pay | Admitting: Oncology

## 2016-07-22 ENCOUNTER — Ambulatory Visit: Payer: Medicare Other | Admitting: Physical Therapy

## 2016-07-22 ENCOUNTER — Other Ambulatory Visit (HOSPITAL_BASED_OUTPATIENT_CLINIC_OR_DEPARTMENT_OTHER): Payer: Medicare Other

## 2016-07-22 VITALS — BP 154/78 | HR 90 | Temp 98.6°F | Resp 17 | Ht 68.0 in | Wt 164.1 lb

## 2016-07-22 DIAGNOSIS — R296 Repeated falls: Secondary | ICD-10-CM

## 2016-07-22 DIAGNOSIS — M25552 Pain in left hip: Secondary | ICD-10-CM | POA: Diagnosis not present

## 2016-07-22 DIAGNOSIS — C9002 Multiple myeloma in relapse: Secondary | ICD-10-CM | POA: Diagnosis not present

## 2016-07-22 DIAGNOSIS — R262 Difficulty in walking, not elsewhere classified: Secondary | ICD-10-CM

## 2016-07-22 DIAGNOSIS — R1030 Lower abdominal pain, unspecified: Secondary | ICD-10-CM | POA: Diagnosis not present

## 2016-07-22 DIAGNOSIS — C9000 Multiple myeloma not having achieved remission: Secondary | ICD-10-CM

## 2016-07-22 LAB — COMPREHENSIVE METABOLIC PANEL
ALBUMIN: 3.2 g/dL — AB (ref 3.5–5.0)
ALK PHOS: 108 U/L (ref 40–150)
ALT: 21 U/L (ref 0–55)
AST: 17 U/L (ref 5–34)
Anion Gap: 7 mEq/L (ref 3–11)
BILIRUBIN TOTAL: 0.54 mg/dL (ref 0.20–1.20)
BUN: 18.5 mg/dL (ref 7.0–26.0)
CO2: 27 meq/L (ref 22–29)
CREATININE: 0.9 mg/dL (ref 0.7–1.3)
Calcium: 9.9 mg/dL (ref 8.4–10.4)
Chloride: 98 mEq/L (ref 98–109)
EGFR: 81 mL/min/{1.73_m2} — ABNORMAL LOW (ref >90)
GLUCOSE: 125 mg/dL (ref 70–140)
Potassium: 4.2 mEq/L (ref 3.5–5.1)
Sodium: 132 mEq/L — ABNORMAL LOW (ref 136–145)
TOTAL PROTEIN: 6.3 g/dL — AB (ref 6.4–8.3)

## 2016-07-22 LAB — CBC WITH DIFFERENTIAL/PLATELET
BASO%: 0.7 % (ref 0.0–2.0)
BASOS ABS: 0 10*3/uL (ref 0.0–0.1)
EOS ABS: 0.1 10*3/uL (ref 0.0–0.5)
EOS%: 3.7 % (ref 0.0–7.0)
HEMATOCRIT: 31.8 % — AB (ref 38.4–49.9)
HEMOGLOBIN: 10.4 g/dL — AB (ref 13.0–17.1)
LYMPH#: 0.4 10*3/uL — AB (ref 0.9–3.3)
LYMPH%: 13.6 % — ABNORMAL LOW (ref 14.0–49.0)
MCH: 30.9 pg (ref 27.2–33.4)
MCHC: 32.7 g/dL (ref 32.0–36.0)
MCV: 94.4 fL (ref 79.3–98.0)
MONO#: 0.3 10*3/uL (ref 0.1–0.9)
MONO%: 11.4 % (ref 0.0–14.0)
NEUT#: 1.9 10*3/uL (ref 1.5–6.5)
NEUT%: 70.6 % (ref 39.0–75.0)
PLATELETS: 166 10*3/uL (ref 140–400)
RBC: 3.37 10*6/uL — AB (ref 4.20–5.82)
RDW: 17.9 % — AB (ref 11.0–14.6)
WBC: 2.7 10*3/uL — ABNORMAL LOW (ref 4.0–10.3)

## 2016-07-22 MED ORDER — POMALIDOMIDE 3 MG PO CAPS: 3.0000 mg | ORAL_CAPSULE | Freq: Every day | ORAL | 0 refills | Status: DC

## 2016-07-22 NOTE — Therapy (Signed)
[place], [place], [address] Phone: [phone]   Fax:  [phone]  Physical Therapy Treatment  Patient Details  Name: Richard [name] MRN: [id number] Date of Birth: May 05, 1933 Referring Provider: Alvan Dame  Encounter Date: 07/22/2016      PT End of Session - 07/22/16 0851    Visit Number 7   Date for PT Re-Evaluation 09/02/16   PT Start Time 0800   PT Stop Time 0901   PT Time Calculation (min) 61 min   Activity Tolerance Patient tolerated treatment well   Behavior During Therapy [facility] for tasks assessed/performed      Past Medical History:  Diagnosis Date  . Abnormal finding on GI tract imaging   . Anemia    pt's daughter denies  . Arrhythmia    atrial fibrillation  . Arthritis   . Atrial fibrillation ([place])   . 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 Cataract    bil removed  . Cholelithiasis   . Cholelithiasis   . Dehydration   . Dyslipidemia   . Dysphagia following unspecified cerebrovascular disease   . Esophageal stricture   . Esophageal stricture   . Essential hypertension, benign   . Facial basal cell cancer    nose  . GERD (gastroesophageal reflux disease)   . GERD (gastroesophageal reflux disease)   . Hiatal hernia   . History of BPH   . History of radiation therapy 01/29/11 -03/15/11   larynx 6600 cGy 33 sessions  . Hordeolum externum (stye)   . Hyperbilirubinemia   . Hypertension   . Hypokalemia   . Hyponatremia   . Left displaced femoral neck fracture ([place])   . Multiple myeloma   . Murmur   . Pancytopenia ([place])   . Pneumonia   . Prostate cancer ([place]) 01/23/11   Gleason 3+3=6  . Pulmonary hypertension ([place])     Past Surgical History:  Procedure Laterality Date  . APPENDECTOMY    . BACK SURGERY    . COLONOSCOPY    . ESOPHAGEAL DILATION    . HIP ARTHROPLASTY Left 06/03/2016   Procedure: ARTHROPLASTY BIPOLAR HIP LEFT  (HEMIARTHROPLASTY);  Surgeon:  Paralee Cancel, MD;  Location[facility];  Service: Orthopedics;  Laterality: Left;  . HIP SURGERY    . right chest wall resection     of second ,third,and fourth rib  . TONSILLECTOMY    . trigeminal neuralgia    . UPPER GASTROINTESTINAL ENDOSCOPY      There were no vitals filed for this visit.      Subjective Assessment - 07/22/16 0802    Subjective I am sore today in the left groin.  He reports that he was up and walking more over the weekend   Currently in Pain? Yes   Pain Score 4    Pain Location Groin   Pain Orientation Left   Pain Descriptors / Indicators Sore   Aggravating Factors  being up on feet a lot   Pain Relieving Factors the treatment here helps                         [facility] Adult PT Treatment/Exercise - 07/22/16 0001      Ambulation/Gait   Gait Comments gait with SPC 120' x 2, cues are needed for safety, he tends to shuffle feet and reach forward for objects to steady himself, gait with SPC, practice stepping  over obstacles, needed a lot of verbal cues to get close and which foot to go with first     High Level Balance   High Level Balance Activities Side stepping;Backward walking;Marching forwards   High Level Balance Comments ball tosses solid surface and airex standing     Knee/Hip Exercises: Aerobic   Nustep level 5 x 6 minutes     Knee/Hip Exercises: Machines for Strengthening   Cybex Knee Extension 10# 3x10   Cybex Knee Flexion 25# 3x10, 15# left leg only   Cybex Leg Press 30# 2x15, no weight single legs 2x10     Knee/Hip Exercises: Seated   Sit to Sand without UE support;20 reps     Knee/Hip Exercises: Supine   Other Supine Knee/Hip Exercises feet on ball K2C, trunk rotation, and small bridges     Moist Heat Therapy   Number Minutes Moist Heat 15 Minutes   Moist Heat Location Hip     Electrical Stimulation   Electrical Stimulation Location left groin   Electrical Stimulation Action IFC   Electrical Stimulation Parameters supine    Electrical Stimulation Goals Pain                  PT Short Term Goals - 07/09/16 1478      PT SHORT TERM GOAL #2   Title independent with intial HEP   Status Achieved           PT Long Term Goals - 07/22/16 0855      PT LONG TERM GOAL #1   Title decrease pain 50%   Status Partially Met     PT LONG TERM GOAL #2   Title decrease TUG time to 25 seconds   Status Partially Met               Plan - 07/22/16 2956    Clinical Impression Statement Patient with some processing issues, with transferring or trying to step over objects, he tenses up, legs get stiff and he tends to shuffle and lean backward.  He did much better with gait on a SPC, cues are needed   PT Next Visit Plan will need to continue to work on balance and processing to be safer.   Consulted and Agree with Plan of Care Patient      Patient will benefit from skilled therapeutic intervention in order to improve the following deficits and impairments:  Abnormal gait, Decreased activity tolerance, Cardiopulmonary status limiting activity, Decreased balance, Decreased mobility, Decreased endurance, Decreased range of motion, Decreased strength, Difficulty walking, Impaired flexibility, Pain  Visit Diagnosis: Pain in left hip  Difficulty in walking, not elsewhere classified  Repeated falls     Problem List Patient Active Problem List   Diagnosis Date Noted  . BPH (benign prostatic hypertrophy) 06/15/2016  . Restless leg syndrome 06/15/2016  . Edema 06/13/2016  . Altered mental status   . Delirium   . Hyponatremia 06/03/2016  . Left displaced femoral neck fracture ([place]) 06/02/2016  . Hip fracture ([place]) 06/02/2016  . Hordeolum externum (stye) 05/28/2016  . Hypokalemia 05/28/2016  . Dehydration 05/28/2016  . Pancytopenia ([place]) 05/28/2016  . Hyperbilirubinemia 05/28/2016  . 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 Multiple myeloma ([place]) 06/23/2012  . Vocal cord cancer ([place]) 12/24/2011  .  Prostate cancer ([place]) 12/24/2011  . Multiple myeloma ([place]) 12/23/2011  . Essential hypertension, benign 10/10/2010  . ESOPHAGEAL STRICTURE 08/28/2010  . CHOLELITHIASIS 08/28/2010  . DYSPHAGIA UNSPECIFIED 08/28/2010  . ABNORMAL FINDINGS  GI TRACT 08/28/2010  . GERD 08/23/2010  . CHOLELITHIASIS 08/23/2010  . HYPERTENSION, PULMONARY 08/10/2010  . ATRIAL FIBRILLATION 08/10/2010  . MURMUR 08/10/2010    [name]. PT  07/22/2016, 9:04 AM  [facility] [place], [place], [address] Phone: [phone]   Fax:  [phone]  Name: Richard [name] MRN: [id number] Date of Birth: 05-20-1933

## 2016-07-22 NOTE — Progress Notes (Addendum)
[facility] OFFICE PROGRESS NOTE   Diagnosis:  Multiple myeloma  INTERVAL HISTORY:   Richard [name] returns as scheduled. He completed cycle 2 Pomalidomide/dexamethasone beginning 06/27/2016. He denies nausea/vomiting. No mouth sores. No diarrhea. No shortness of breath. No chest pain. No leg swelling or calf pain. He continues to have pain in the left groin region.  Objective:  Vital signs in last 24 hours:  Blood pressure (!) 154/78, pulse 90, temperature 98.6 F (37 C), temperature source Oral, resp. rate 17, height _0  (1.727 m), weight 164 lb 1.6 oz (74.4 kg), SpO2 100 %.    HEENT: No thrush or ulcers. Resp: Lungs clear bilaterally. Cardio: Regular rate and rhythm. GI: Abdomen soft and nontender. No organomegaly. Vascular: No leg edema. Calves soft and nontender.   Lab Results:  Lab Results  Component Value Date   WBC 2.7 (L) 07/22/2016   HGB 10.4 (L) 07/22/2016   HCT 31.8 (L) 07/22/2016   MCV 94.4 07/22/2016   PLT 166 07/22/2016   NEUTROABS 1.9 07/22/2016    Imaging:  No results found.  Medications: I have reviewed the patient's current medications.  Assessment/Plan: 1. Multiple myeloma: The serum free light chains were elevated 08/29/2009. A CT of the chest 08/31/2009 showed a new pleural mass consistent with progression of multiple myeloma. He completed 12 cycles of Revlimid/Decadron. The serum free light chains were improved to 4.34 on 12/29/2009. A restaging CT of the chest 02/19/2010 showed resolution of right axillary lymphadenopathy and a pleural-based mass. The serum free lambda light chains were again elevated.  Salvage Revlimid/Decadron initiated 12/08/2014  Cycle 2 01/05/2015  Serum light chains improved 01/24/2015  Cycle 3 02/01/2015  Cycle 4 03/02/2015  Cycle 5 03/30/2015  Cycle 6 04/27/2015  Cycle 7 05/25/2015  Cycle 8 06/22/2015  Cycle 9 07/20/2015  Cycle 10 08/18/2015  Changed to maintenance Revlimid beginning  09/23/2015  Progressive anemia and Elevated serum free light chains 11/13/2015  Changed to weekly Cytoxan/Velcade/Decadron beginning 11/21/2015, changed to 3/4 week schedule beginning 01/02/2016  Serum light chains improved on 03/26/2016  Serum light chains increased 04/23/2016  Serum light chains increased 05/21/2016  Cycle 1 pomalidomide/decadron 05/30/2016  Cycle 2 pomalidomide/Decadron 06/27/2016  Serum light chains improved 07/03/2016  Cycle 3 Pomalidomide/Decadron 07/25/2016 2.History of back pain, likely related to the pleural-based mass at the right chest, resolved. 2. Right axillary/subpectoral lymphadenopathy on a CT of the chest 08/31/2009, likely related to multiple myeloma, resolved. 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 He is maintained on every three-month Zometa. 4. Pancytopenia secondary to Revlimid and multiple myeloma. He has persistent mild thrombocytopenia. 5. Hypertension. 6. Status post hip replacement. 7. History of back surgery. 8. History of trigeminal neuralgia. 9. Status post removal of a lipoma from the right axilla in February 2010. 10. 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 11. Esophageal reflux disease, followed by Dr. Janace Hoard.  12. Esophageal stricture, status post an evaluation by Dr. Henrene Pastor. 13. Right vocal cord lesion, status post a biopsy by Dr. 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 He completed radiation under the direction of Dr. Valere Dross on 03/15/2011. 14. Admission with pneumonia 03/26/2010. 15. History of Atrial flutter/fibrillation while hospitalized August 2011. He is followed by Dr. Percival Spanish. 16. Elevated prostate specific antigen, status post a biopsy 01/23/2011. He was found to have a Gleason 6 cancer in 10% of the cores. He is followed with an observation approach. He is now followed by Dr. Diona Fanti. 17. History of Mild  hypercalcemia  18. Fall with a left  wrist fracture June 2015  19. Right chest wall pain secondary to a right second rib plasmacytoma confirmed on a CT 11/28/2014, resolved 20. History of Hyponatremia 21. Thrombocytopenia secondary progression of multiple myeloma and chemotherapy 22. Fall with left femoral neck fracture 06/02/2016, left hemiarthroplasty 06/03/2016 23. Hospital delirium June 2017   Disposition: Mr. Lad appears stable. He has completed 2 cycles of Pomalidomide/dexamethasone. Serum lambda light chains improved 07/03/2016. He will begin cycle 3 Pomalidomide/dexamethasone beginning 07/25/2016. He will return for a follow-up visit and Zometa in one month. He will contact the office in the interim with any problems.  Patient seen with Dr. Benay Spice.    Sebastin Card ANP/GNP-BC   07/22/2016  2:27 PM  This was a shared visit with Jeevan Card. Mr. Vu appears stable. The plan is to begin cycle 3 of salvage therapy with pomalidomide/dex. Later this week.  Julieanne Manson, M.D.

## 2016-07-22 NOTE — Telephone Encounter (Signed)
Pomalyst rx authorizaton completed 07/22/16.  New authorization number: [id number]

## 2016-07-22 NOTE — Telephone Encounter (Signed)
Gave patient dtr avs report and appointments for September.  °

## 2016-07-23 LAB — KAPPA/LAMBDA LIGHT CHAINS
IG KAPPA FREE LIGHT CHAIN: 19.7 mg/L — AB (ref 3.3–19.4)
Ig Lambda Free Light Chain: 30.6 mg/L — ABNORMAL HIGH (ref 5.7–26.3)
KAPPA/LAMBDA FLC RATIO: 0.64 (ref 0.26–1.65)

## 2016-07-24 ENCOUNTER — Ambulatory Visit: Payer: Medicare Other | Admitting: Physical Therapy

## 2016-07-24 ENCOUNTER — Ambulatory Visit
Admission: RE | Admit: 2016-07-24 | Discharge: 2016-07-24 | Disposition: A | Discharge status: Home / Self Care | Payer: Medicare Other | Source: Ambulatory Visit | Attending: Family Medicine | Admitting: Family Medicine

## 2016-07-24 ENCOUNTER — Encounter: Payer: Self-pay | Admitting: Physical Therapy

## 2016-07-24 DIAGNOSIS — M25552 Pain in left hip: Secondary | ICD-10-CM

## 2016-07-24 DIAGNOSIS — R479 Unspecified speech disturbances: Secondary | ICD-10-CM

## 2016-07-24 DIAGNOSIS — R262 Difficulty in walking, not elsewhere classified: Secondary | ICD-10-CM

## 2016-07-24 DIAGNOSIS — R296 Repeated falls: Secondary | ICD-10-CM

## 2016-07-24 MED ORDER — GADOBENATE DIMEGLUMINE 529 MG/ML IV SOLN
15.0000 mL | Freq: Once | INTRAVENOUS | Status: AC | PRN
  Administered 2016-07-24: 15 mL via INTRAVENOUS

## 2016-07-24 NOTE — Therapy (Signed)
[place], [place], [address] Phone: [phone]   Fax:  [phone]  Physical Therapy Treatment  Patient Details  Name: Richard [name] MRN: [id number] Date of Birth: 1933-07-28 Referring Provider: Alvan [name]  Encounter Date: 07/24/2016      PT End of Session - 07/24/16 0847    Visit Number 8   Date for PT Re-Evaluation 09/02/16   PT Start Time 0802   PT Stop Time 0848   PT Time Calculation (min) 46 min   Activity Tolerance Patient tolerated treatment well   Behavior During Therapy Richard [name] for tasks assessed/performed      Past Medical History:  Diagnosis Date  . Abnormal finding on GI tract imaging   . Anemia    pt's daughter denies  . Arrhythmia    atrial fibrillation  . Arthritis   . Atrial fibrillation ([place])   . 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 Cataract    bil removed  . Cholelithiasis   . Cholelithiasis   . Dehydration   . Dyslipidemia   . Dysphagia following unspecified cerebrovascular disease   . Esophageal stricture   . Esophageal stricture   . Essential hypertension, benign   . Facial basal cell cancer    nose  . GERD (gastroesophageal reflux disease)   . GERD (gastroesophageal reflux disease)   . Hiatal hernia   . History of BPH   . History of radiation therapy 01/29/11 -03/15/11   larynx 6600 cGy 33 sessions  . Hordeolum externum (stye)   . Hyperbilirubinemia   . Hypertension   . Hypokalemia   . Hyponatremia   . Left displaced femoral neck fracture ([place])   . Multiple myeloma   . Murmur   . Pancytopenia ([place])   . Pneumonia   . Prostate cancer ([place]) 01/23/11   Gleason 3+3=6  . Pulmonary hypertension ([place])     Past Surgical History:  Procedure Laterality Date  . APPENDECTOMY    . BACK SURGERY    . COLONOSCOPY    . ESOPHAGEAL DILATION    . HIP ARTHROPLASTY Left 06/03/2016   Procedure: ARTHROPLASTY BIPOLAR HIP LEFT  (HEMIARTHROPLASTY);  Surgeon:  Richard Cancel, MD;  Location: [facility];  Service: Orthopedics;  Laterality: Left;  . HIP SURGERY    . right chest wall resection     of second ,third,and fourth rib  . TONSILLECTOMY    . trigeminal neuralgia    . UPPER GASTROINTESTINAL ENDOSCOPY      There were no vitals filed for this visit.      Subjective Assessment - 07/24/16 0807    Subjective Patient and daugther report him being tired, they report that he was taking some sort of prescribed steroid that causes you not to sleep   Currently in Pain? No/denies                         [name] Adult PT Treatment/Exercise - 07/24/16 0001      Ambulation/Gait   Gait Comments gait with SPC 120' x 2, cues are needed for safety, he tends to shuffle feet and reach forward for objects to steady himself, gait with SPC, practice stepping over obstacles, needed a lot of verbal cues to get close and which foot to go with first     High Level Balance   High Level Balance Activities Side stepping;Backward walking;Marching forwards   High Level Balance  Comments ball tosses solid surface and airex standing     Knee/Hip Exercises: Aerobic   Nustep level 6 x 6 minutes     Knee/Hip Exercises: Machines for Strengthening   Cybex Knee Extension 15# 3x10   Cybex Knee Flexion 35# 3x10, 15# left leg only   Cybex Leg Press 30# 2x15, no weight single legs 2x10     Knee/Hip Exercises: Seated   Ball Squeeze gentle x 25   Sit to Sand without UE support;20 reps                  PT Short Term Goals - 07/09/16 6440      PT SHORT TERM GOAL #2   Title independent with intial HEP   Status Achieved           PT Long Term Goals - 07/24/16 0849      PT LONG TERM GOAL #1   Title decrease pain 50%   Status Partially Met     PT LONG TERM GOAL #2   Title decrease TUG time to 25 seconds     PT LONG TERM GOAL #3   Title walk with SPC 300 feet   Status Partially Met               Plan - 07/24/16 0848    Clinical  Impression Statement Today [name] did the best I have seen him do.  He does have weakness in both hips he tends to internally rotate the hips, his biggest issue right now is safety and balance   PT Next Visit Plan will need to continue to work on balance and processing to be safer.   Consulted and Agree with Plan of Care Patient      Patient will benefit from skilled therapeutic intervention in order to improve the following deficits and impairments:  Abnormal gait, Decreased activity tolerance, Cardiopulmonary status limiting activity, Decreased balance, Decreased mobility, Decreased endurance, Decreased range of motion, Decreased strength, Difficulty walking, Impaired flexibility, Pain  Visit Diagnosis: Pain in left hip  Difficulty in walking, not elsewhere classified  Repeated falls     Problem List Patient Active Problem List   Diagnosis Date Noted  . BPH (benign prostatic hypertrophy) 06/15/2016  . Restless leg syndrome 06/15/2016  . Edema 06/13/2016  . Altered mental status   . Delirium   . Hyponatremia 06/03/2016  . Left displaced femoral neck fracture ([place]) 06/02/2016  . Hip fracture ([place]) 06/02/2016  . Hordeolum externum (stye) 05/28/2016  . Hypokalemia 05/28/2016  . Dehydration 05/28/2016  . Pancytopenia ([place]) 05/28/2016  . Hyperbilirubinemia 05/28/2016  . Cancer of true vocal cord ([place])   . Multiple myeloma ([place]) 06/23/2012  . Vocal cord cancer ([place]) 12/24/2011  . Prostate cancer ([place]) 12/24/2011  . Multiple myeloma ([place]) 12/23/2011  . Essential hypertension, benign 10/10/2010  . ESOPHAGEAL STRICTURE 08/28/2010  . CHOLELITHIASIS 08/28/2010  . DYSPHAGIA UNSPECIFIED 08/28/2010  . ABNORMAL FINDINGS GI TRACT 08/28/2010  . GERD 08/23/2010  . CHOLELITHIASIS 08/23/2010  . HYPERTENSION, PULMONARY 08/10/2010  . ATRIAL FIBRILLATION 08/10/2010  . MURMUR 08/10/2010    Richard [name]., PT 07/24/2016, 8:52 AM  [facility] [place], [place], [address] Phone: [phone]   Fax:  [phone]  Name: Richard [name] MRN: [id number] Date of Birth: July 30, 1933

## 2016-07-30 ENCOUNTER — Ambulatory Visit: Payer: Medicare Other | Admitting: Physical Therapy

## 2016-07-30 ENCOUNTER — Encounter: Payer: Self-pay | Admitting: Physical Therapy

## 2016-07-30 DIAGNOSIS — M25552 Pain in left hip: Secondary | ICD-10-CM | POA: Diagnosis not present

## 2016-07-30 DIAGNOSIS — R262 Difficulty in walking, not elsewhere classified: Secondary | ICD-10-CM

## 2016-07-30 DIAGNOSIS — R296 Repeated falls: Secondary | ICD-10-CM

## 2016-07-30 NOTE — Therapy (Signed)
[place], [place], [address] Phone: [phone]   Fax:  [phone]  Physical Therapy Treatment  Patient Details  Name: Richard [name] MRN: [id number] Date of Birth: 09-17-1933 Referring Provider: Alvan Dame  Encounter Date: 07/30/2016      PT End of Session - 07/30/16 0824    Visit Number 9   Date for PT Re-Evaluation 09/02/16   PT Start Time 0753   PT Stop Time 0844   PT Time Calculation (min) 51 min   Activity Tolerance Patient tolerated treatment well   Behavior During Therapy [facility] for tasks assessed/performed      Past Medical History:  Diagnosis Date  . Abnormal finding on GI tract imaging   . Anemia    pt's daughter denies  . Arrhythmia    atrial fibrillation  . Arthritis   . Atrial fibrillation ([place])   . 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 Cataract    bil removed  . Cholelithiasis   . Cholelithiasis   . Dehydration   . Dyslipidemia   . Dysphagia following unspecified cerebrovascular disease   . Esophageal stricture   . Esophageal stricture   . Essential hypertension, benign   . Facial basal cell cancer    nose  . GERD (gastroesophageal reflux disease)   . GERD (gastroesophageal reflux disease)   . Hiatal hernia   . History of BPH   . History of radiation therapy 01/29/11 -03/15/11   larynx 6600 cGy 33 sessions  . Hordeolum externum (stye)   . Hyperbilirubinemia   . Hypertension   . Hypokalemia   . Hyponatremia   . Left displaced femoral neck fracture ([place])   . Multiple myeloma   . Murmur   . Pancytopenia ([place])   . Pneumonia   . Prostate cancer ([place]) 01/23/11   Gleason 3+3=6  . Pulmonary hypertension ([place])     Past Surgical History:  Procedure Laterality Date  . APPENDECTOMY    . BACK SURGERY    . COLONOSCOPY    . ESOPHAGEAL DILATION    . HIP ARTHROPLASTY Left 06/03/2016   Procedure: ARTHROPLASTY BIPOLAR HIP LEFT  (HEMIARTHROPLASTY);  Surgeon:  Paralee Cancel, MD;  Location: [facility];  Service: Orthopedics;  Laterality: Left;  . HIP SURGERY    . right chest wall resection     of second ,third,and fourth rib  . TONSILLECTOMY    . trigeminal neuralgia    . UPPER GASTROINTESTINAL ENDOSCOPY      There were no vitals filed for this visit.      Subjective Assessment - 07/30/16 0816    Subjective Patient reports he is feeling better, saw the MD, feels everything is doing well.   Currently in Pain? Yes   Pain Score 3    Pain Location Groin   Pain Descriptors / Indicators Sore   Aggravating Factors  standing   Pain Relieving Factors treatment                         [facility] Adult PT Treatment/Exercise - 07/30/16 0001      Ambulation/Gait   Gait Comments gait with SPC, around the building on the outside, had to negotiate curbs and slopes, needed cues for step length, as he got tired he really shuffled feet     High Level Balance   High Level Balance Activities Side stepping;Backward walking;Marching forwards   High Level  Balance Comments ball tosses solid surface and airex standing     Knee/Hip Exercises: Aerobic   Nustep level 6 x 6 minutes     Knee/Hip Exercises: Machines for Strengthening   Cybex Knee Extension 15# 3x10   Cybex Knee Flexion 35# 3x10, 15# left leg only   Cybex Leg Press 30# 2x15, no weight single legs 2x10     Knee/Hip Exercises: Seated   Ball Squeeze gentle x 25   Sit to Sand without UE support;20 reps                  PT Short Term Goals - 07/09/16 7106      PT SHORT TERM GOAL #2   Title independent with intial HEP   Status Achieved           PT Long Term Goals - 07/24/16 0849      PT LONG TERM GOAL #1   Title decrease pain 50%   Status Partially Met     PT LONG TERM GOAL #2   Title decrease TUG time to 25 seconds     PT LONG TERM GOAL #3   Title walk with SPC 300 feet   Status Partially Met               Plan - 07/30/16 0844    Clinical  Impression Statement Patient with great fatigue with walking around the building, got to the point where he was leaned forward and shuffling feet, needing cues for posture and to pick up his feet.  Tended to lean backward and be more on his heels during balance today, all LOB was to the back today.   PT Next Visit Plan will need to continue to work on balance and processing to be safer.   Consulted and Agree with Plan of Care Patient      Patient will benefit from skilled therapeutic intervention in order to improve the following deficits and impairments:     Visit Diagnosis: Pain in left hip  Difficulty in walking, not elsewhere classified  Repeated falls     Problem List Patient Active Problem List   Diagnosis Date Noted  . BPH (benign prostatic hypertrophy) 06/15/2016  . Restless leg syndrome 06/15/2016  . Edema 06/13/2016  . Altered mental status   . Delirium   . Hyponatremia 06/03/2016  . Left displaced femoral neck fracture ([place]) 06/02/2016  . Hip fracture ([place]) 06/02/2016  . Hordeolum externum (stye) 05/28/2016  . Hypokalemia 05/28/2016  . Dehydration 05/28/2016  . Pancytopenia ([place]) 05/28/2016  . Hyperbilirubinemia 05/28/2016  . Cancer of true vocal cord ([place])   . Multiple myeloma ([place]) 06/23/2012  . Vocal cord cancer ([place]) 12/24/2011  . Prostate cancer ([place]) 12/24/2011  . Multiple myeloma ([place]) 12/23/2011  . Essential hypertension, benign 10/10/2010  . ESOPHAGEAL STRICTURE 08/28/2010  . CHOLELITHIASIS 08/28/2010  . DYSPHAGIA UNSPECIFIED 08/28/2010  . ABNORMAL FINDINGS GI TRACT 08/28/2010  . GERD 08/23/2010  . CHOLELITHIASIS 08/23/2010  . HYPERTENSION, PULMONARY 08/10/2010  . ATRIAL FIBRILLATION 08/10/2010  . MURMUR 08/10/2010    [name]., PT 07/30/2016, 8:46 AM  [facility] [place], [place], [address] Phone: [phone]   Fax:  [phone]  Name: ASTER ECKRICH MRN: [id number] Date of Birth: Jul 31, 1933

## 2016-08-01 ENCOUNTER — Ambulatory Visit: Payer: Medicare Other | Admitting: Physical Therapy

## 2016-08-01 ENCOUNTER — Encounter: Payer: Self-pay | Admitting: Physical Therapy

## 2016-08-01 DIAGNOSIS — M25552 Pain in left hip: Secondary | ICD-10-CM | POA: Diagnosis not present

## 2016-08-01 DIAGNOSIS — R262 Difficulty in walking, not elsewhere classified: Secondary | ICD-10-CM

## 2016-08-01 DIAGNOSIS — R296 Repeated falls: Secondary | ICD-10-CM

## 2016-08-01 NOTE — Therapy (Signed)
[place], [place], [address] Phone: [phone]   Fax:  [phone]  Physical Therapy Treatment  Patient Details  Name: Richard [name] MRN: [id number] Date of Birth: 11-Jul-1933 Referring Provider: Alvan Dame  Encounter Date: 08/01/2016      PT End of Session - 08/01/16 0822    Visit Number 10   Date for PT Re-Evaluation 09/02/16   PT Start Time 0753   PT Stop Time 0856   PT Time Calculation (min) 63 min   Activity Tolerance Patient tolerated treatment well   Behavior During Therapy [facility] for tasks assessed/performed      Past Medical History:  Diagnosis Date  . Abnormal finding on GI tract imaging   . Anemia    pt's daughter denies  . Arrhythmia    atrial fibrillation  . Arthritis   . Atrial fibrillation ([place])   . 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 Cataract    bil removed  . Cholelithiasis   . Cholelithiasis   . Dehydration   . Dyslipidemia   . Dysphagia following unspecified cerebrovascular disease   . Esophageal stricture   . Esophageal stricture   . Essential hypertension, benign   . Facial basal cell cancer    nose  . GERD (gastroesophageal reflux disease)   . GERD (gastroesophageal reflux disease)   . Hiatal hernia   . History of BPH   . History of radiation therapy 01/29/11 -03/15/11   larynx 6600 cGy 33 sessions  . Hordeolum externum (stye)   . Hyperbilirubinemia   . Hypertension   . Hypokalemia   . Hyponatremia   . Left displaced femoral neck fracture ([place])   . Multiple myeloma   . Murmur   . Pancytopenia ([place])   . Pneumonia   . Prostate cancer ([place]) 01/23/11   Gleason 3+3=6  . Pulmonary hypertension ([place])     Past Surgical History:  Procedure Laterality Date  . APPENDECTOMY    . BACK SURGERY    . COLONOSCOPY    . ESOPHAGEAL DILATION    . HIP ARTHROPLASTY Left 06/03/2016   Procedure: ARTHROPLASTY BIPOLAR HIP LEFT  (HEMIARTHROPLASTY);  Surgeon:  Paralee Cancel, MD;  Location: [facility];  Service: Orthopedics;  Laterality: Left;  . HIP SURGERY    . right chest wall resection     of second ,third,and fourth rib  . TONSILLECTOMY    . trigeminal neuralgia    . UPPER GASTROINTESTINAL ENDOSCOPY      There were no vitals filed for this visit.      Subjective Assessment - 08/01/16 0755    Subjective Patient reports that he was very sore after last treatment and has been dragging his feet and having difficulty walking   Currently in Pain? Yes   Pain Score 6    Pain Location Groin   Pain Orientation Left   Aggravating Factors  walking around the building really wore me out and made me sore   Pain Relieving Factors treatment                         [facility] Adult PT Treatment/Exercise - 08/01/16 0001      Knee/Hip Exercises: Aerobic   Nustep level 5 x 6 minutes     Knee/Hip Exercises: Machines for Strengthening   Cybex Knee Extension 15# 3x10   Cybex Knee Flexion 25# 3x10   Cybex Leg Press  30# 2x15, no weight single legs 2x10     Knee/Hip Exercises: Standing   Hip Flexion 3 sets;10 reps   Hip Flexion Limitations 3#   Hip Abduction 2 sets;15 reps;Both   Abduction Limitations 3#     Knee/Hip Exercises: Seated   Ball Squeeze 30X   Sit to [employer] without UE support;20 reps     Knee/Hip Exercises: Supine   Other Supine Knee/Hip Exercises feet on ball K2C, trunk rotation, and small bridges     Moist Heat Therapy   Number Minutes Moist Heat 15 Minutes   Moist Heat Location Hip     Electrical Stimulation   Electrical Stimulation Location left groin   Electrical Stimulation Action IFC   Electrical Stimulation Parameters supine   Electrical Stimulation Goals Pain                  PT Short Term Goals - 07/09/16 2122      PT SHORT TERM GOAL #2   Title independent with intial HEP   Status Achieved           PT Long Term Goals - 08/29/2016 0840      PT LONG TERM GOAL #1   Title decrease pain 50%    Status Partially Met     PT LONG TERM GOAL #2   Title decrease TUG time to 25 seconds   Status Partially Met     PT LONG TERM GOAL #3   Title walk with SPC 300 feet   Status Partially Met     PT LONG TERM GOAL #4   Title go up and down stairs step over step   Status Partially Met     PT LONG TERM GOAL #5   Title increase left hip strength to 4+/5   Status Partially Met               Plan - 2016/08/29 0839    Clinical Impression Statement Patient with a worse extensor pattern today, leaning back more and really using the backs of his legs to get up, with balance activities really lost balance to the back, this has been here but is much more prominent today, possibly due to fatigue and weakness from the walk we did on Tuesday.   PT Frequency 2x / week   PT Duration 4 weeks   PT Treatment/Interventions ADLs/Self Care Home Management;Electrical Stimulation;Moist Heat;Gait training;Functional mobility training;Patient/family education;Therapeutic exercise;Therapeutic activities;Balance training;Manual techniques   PT Next Visit Plan will need to continue to work on balance and processing to be safer.   Consulted and Agree with Plan of Care Patient      Patient will benefit from skilled therapeutic intervention in order to improve the following deficits and impairments:  Abnormal gait, Decreased activity tolerance, Cardiopulmonary status limiting activity, Decreased balance, Decreased mobility, Decreased endurance, Decreased range of motion, Decreased strength, Difficulty walking, Impaired flexibility, Pain  Visit Diagnosis: Pain in left hip  Difficulty in walking, not elsewhere classified  Repeated falls       G-Codes - 08-29-2016 0841    Functional Assessment Tool Used foto 60% limitation   Functional Limitation Mobility: Walking and moving around   Mobility: Walking and Moving Around Current Status [phone]) At least 60 percent but less than 80 percent impaired, limited  or restricted   Mobility: Walking and Moving Around Goal Status [phone]) At least 40 percent but less than 60 percent impaired, limited or restricted      Problem List Patient Active Problem List  Diagnosis Date Noted  . BPH (benign prostatic hypertrophy) 06/15/2016  . Restless leg syndrome 06/15/2016  . Edema 06/13/2016  . Altered mental status   . Delirium   . Hyponatremia 06/03/2016  . Left displaced femoral neck fracture ([place]) 06/02/2016  . Hip fracture ([place]) 06/02/2016  . Hordeolum externum (stye) 05/28/2016  . Hypokalemia 05/28/2016  . Dehydration 05/28/2016  . Pancytopenia ([place]) 05/28/2016  . Hyperbilirubinemia 05/28/2016  . 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 Multiple myeloma ([place]) 06/23/2012  . Vocal cord cancer ([place]) 12/24/2011  . Prostate cancer ([place]) 12/24/2011  . Multiple myeloma ([place]) 12/23/2011  . Essential hypertension, benign 10/10/2010  . ESOPHAGEAL STRICTURE 08/28/2010  . CHOLELITHIASIS 08/28/2010  . DYSPHAGIA UNSPECIFIED 08/28/2010  . ABNORMAL FINDINGS GI TRACT 08/28/2010  . GERD 08/23/2010  . CHOLELITHIASIS 08/23/2010  . HYPERTENSION, PULMONARY 08/10/2010  . ATRIAL FIBRILLATION 08/10/2010  . MURMUR 08/10/2010    [name]., PT 08/01/2016, 8:47 AM  [facility] [place], [place], [address] Phone: [phone]   Fax:  [phone]  Name: HAMZAH SAVOCA MRN: [id number] Date of Birth: [birth year]

## 2016-08-06 ENCOUNTER — Ambulatory Visit: Payer: Medicare Other | Admitting: Physical Therapy

## 2016-08-06 ENCOUNTER — Encounter: Payer: Self-pay | Admitting: Physical Therapy

## 2016-08-06 DIAGNOSIS — R296 Repeated falls: Secondary | ICD-10-CM

## 2016-08-06 DIAGNOSIS — M25552 Pain in left hip: Secondary | ICD-10-CM | POA: Diagnosis not present

## 2016-08-06 DIAGNOSIS — R262 Difficulty in walking, not elsewhere classified: Secondary | ICD-10-CM

## 2016-08-06 NOTE — Therapy (Signed)
[place], [place], [address] Phone: [phone]   Fax:  [phone]  Physical Therapy Treatment  Patient Details  Name: Richard [name] MRN: [id number] Date of Birth: Sep 02, 1933 Referring Provider: Alvan Dame  Encounter Date: 08/06/2016      PT End of Session - 08/06/16 0843    Visit Number 11   Date for PT Re-Evaluation 09/02/16   PT Start Time 0800   PT Stop Time 0859   PT Time Calculation (min) 59 min   Activity Tolerance Patient tolerated treatment well   Behavior During Therapy [facility] for tasks assessed/performed      Past Medical History:  Diagnosis Date  . Abnormal finding on GI tract imaging   . Anemia    pt's daughter denies  . Arrhythmia    atrial fibrillation  . Arthritis   . Atrial fibrillation ([place])   . 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 Cataract    bil removed  . Cholelithiasis   . Cholelithiasis   . Dehydration   . Dyslipidemia   . Dysphagia following unspecified cerebrovascular disease   . Esophageal stricture   . Esophageal stricture   . Essential hypertension, benign   . Facial basal cell cancer    nose  . GERD (gastroesophageal reflux disease)   . GERD (gastroesophageal reflux disease)   . Hiatal hernia   . History of BPH   . History of radiation therapy 01/29/11 -03/15/11   larynx 6600 cGy 33 sessions  . Hordeolum externum (stye)   . Hyperbilirubinemia   . Hypertension   . Hypokalemia   . Hyponatremia   . Left displaced femoral neck fracture ([place])   . Multiple myeloma   . Murmur   . Pancytopenia ([place])   . Pneumonia   . Prostate cancer ([place]) 01/23/11   Gleason 3+3=6  . Pulmonary hypertension ([place])     Past Surgical History:  Procedure Laterality Date  . APPENDECTOMY    . BACK SURGERY    . COLONOSCOPY    . ESOPHAGEAL DILATION    . HIP ARTHROPLASTY Left 06/03/2016   Procedure: ARTHROPLASTY BIPOLAR HIP LEFT  (HEMIARTHROPLASTY);  Surgeon:  Paralee Cancel, MD;  Location: [facility];  Service: Orthopedics;  Laterality: Left;  . HIP SURGERY    . right chest wall resection     of second ,third,and fourth rib  . TONSILLECTOMY    . trigeminal neuralgia    . UPPER GASTROINTESTINAL ENDOSCOPY      There were no vitals filed for this visit.      Subjective Assessment - 08/06/16 0805    Subjective Patient reports that the heat and estim really felt good after last time, he does report neck pain, daughter reports that it is a DDD from MD.  He was able to do some shopping but was very tired/   Currently in Pain? Yes   Pain Score 3    Pain Location Groin   Pain Descriptors / Indicators Sore   Aggravating Factors  does report some neck pain                          [facility] Adult PT Treatment/Exercise - 08/06/16 0001      Ambulation/Gait   Gait Comments gait with HHA today so I could speed him up and have him take bigger steps     High Level Balance  High Level Balance Activities Side stepping;Backward walking;Marching forwards   High Level Balance Comments worked a lot on getting him to get his center of gravity forward, reaching activities, stepping over obstacles     Knee/Hip Exercises: Aerobic   Nustep level 6 x 6 minutes     Knee/Hip Exercises: Machines for Strengthening   Cybex Leg Press 30# 2x15, no weight single legs 2x10     Knee/Hip Exercises: Standing   Hip Flexion 3 sets;10 reps   Hip Flexion Limitations 3#     Moist Heat Therapy   Number Minutes Moist Heat 15 Minutes   Moist Heat Location Hip     Electrical Stimulation   Electrical Stimulation Location left groin   Electrical Stimulation Action IFC   Electrical Stimulation Parameters supine   Electrical Stimulation Goals Pain                  PT Short Term Goals - 07/09/16 8756      PT SHORT TERM GOAL #2   Title independent with intial HEP   Status Achieved           PT Long Term Goals - 08/01/16 0840      PT LONG TERM  GOAL #1   Title decrease pain 50%   Status Partially Met     PT LONG TERM GOAL #2   Title decrease TUG time to 25 seconds   Status Partially Met     PT LONG TERM GOAL #3   Title walk with SPC 300 feet   Status Partially Met     PT LONG TERM GOAL #4   Title go up and down stairs step over step   Status Partially Met     PT LONG TERM GOAL #5   Title increase left hip strength to 4+/5   Status Partially Met               Plan - 08/06/16 0844    Clinical Impression Statement He continues with an extensor pattern today, needed a lot of cues and help with trying to get center of gravity forward as he tended to lose balance backward, he still tends to creep along furniture which is unsafe.   PT Next Visit Plan will need to continue to work on balance and processing to be safer.   Consulted and Agree with Plan of Care Patient      Patient will benefit from skilled therapeutic intervention in order to improve the following deficits and impairments:  Abnormal gait, Decreased activity tolerance, Cardiopulmonary status limiting activity, Decreased balance, Decreased mobility, Decreased endurance, Decreased range of motion, Decreased strength, Difficulty walking, Impaired flexibility, Pain  Visit Diagnosis: Pain in left hip  Difficulty in walking, not elsewhere classified  Repeated falls     Problem List Patient Active Problem List   Diagnosis Date Noted  . BPH (benign prostatic hypertrophy) 06/15/2016  . Restless leg syndrome 06/15/2016  . Edema 06/13/2016  . Altered mental status   . Delirium   . Hyponatremia 06/03/2016  . Left displaced femoral neck fracture ([place]) 06/02/2016  . Hip fracture ([place]) 06/02/2016  . Hordeolum externum (stye) 05/28/2016  . Hypokalemia 05/28/2016  . Dehydration 05/28/2016  . Pancytopenia ([place]) 05/28/2016  . Hyperbilirubinemia 05/28/2016  . Cancer of true vocal cord ([place])   . Multiple myeloma ([place]) 06/23/2012  . Vocal cord cancer ([place])  12/24/2011  . Prostate cancer ([place]) 12/24/2011  . Multiple myeloma ([place]) 12/23/2011  . Essential hypertension, benign 10/10/2010  .  ESOPHAGEAL STRICTURE 08/28/2010  . CHOLELITHIASIS 08/28/2010  . DYSPHAGIA UNSPECIFIED 08/28/2010  . ABNORMAL FINDINGS GI TRACT 08/28/2010  . GERD 08/23/2010  . CHOLELITHIASIS 08/23/2010  . HYPERTENSION, PULMONARY 08/10/2010  . ATRIAL FIBRILLATION 08/10/2010  . MURMUR 08/10/2010    [name]., PT 08/06/2016, 8:46 AM  [facility] [place], [place], [address] Phone: [phone]   Fax:  [phone]  Name: Richard [name] MRN: [id number] Date of Birth: 11-03-1933

## 2016-08-08 ENCOUNTER — Ambulatory Visit: Payer: Medicare Other | Admitting: Physical Therapy

## 2016-08-08 ENCOUNTER — Encounter: Payer: Self-pay | Admitting: Physical Therapy

## 2016-08-08 DIAGNOSIS — M25552 Pain in left hip: Secondary | ICD-10-CM | POA: Diagnosis not present

## 2016-08-08 DIAGNOSIS — R262 Difficulty in walking, not elsewhere classified: Secondary | ICD-10-CM

## 2016-08-08 DIAGNOSIS — R296 Repeated falls: Secondary | ICD-10-CM

## 2016-08-08 NOTE — Therapy (Signed)
[place], [place], [address] Phone: [phone]   Fax:  [phone]  Physical Therapy Treatment  Patient Details  Name: Richard [name] MRN: [id number] Date of Birth: 1933-10-08 Referring Provider: Alvan Dame  Encounter Date: 08/08/2016      PT End of Session - 08/08/16 0904    Visit Number 12   Date for PT Re-Evaluation 09/02/16   PT Start Time 0800   PT Stop Time 0900   PT Time Calculation (min) 60 min   Activity Tolerance Patient tolerated treatment well   Behavior During Therapy [facility] for tasks assessed/performed      Past Medical History:  Diagnosis Date  . Abnormal finding on GI tract imaging   . Anemia    pt's daughter denies  . Arrhythmia    atrial fibrillation  . Arthritis   . Atrial fibrillation ([place])   . 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 Cataract    bil removed  . Cholelithiasis   . Cholelithiasis   . Dehydration   . Dyslipidemia   . Dysphagia following unspecified cerebrovascular disease   . Esophageal stricture   . Esophageal stricture   . Essential hypertension, benign   . Facial basal cell cancer    nose  . GERD (gastroesophageal reflux disease)   . GERD (gastroesophageal reflux disease)   . Hiatal hernia   . History of BPH   . History of radiation therapy 01/29/11 -03/15/11   larynx 6600 cGy 33 sessions  . Hordeolum externum (stye)   . Hyperbilirubinemia   . Hypertension   . Hypokalemia   . Hyponatremia   . Left displaced femoral neck fracture ([place])   . Multiple myeloma   . Murmur   . Pancytopenia ([place])   . Pneumonia   . Prostate cancer ([place]) 01/23/11   Gleason 3+3=6  . Pulmonary hypertension ([place])     Past Surgical History:  Procedure Laterality Date  . APPENDECTOMY    . BACK SURGERY    . COLONOSCOPY    . ESOPHAGEAL DILATION    . HIP ARTHROPLASTY Left 06/03/2016   Procedure: ARTHROPLASTY BIPOLAR HIP LEFT  (HEMIARTHROPLASTY);  Surgeon:  Paralee Cancel, MD;  Location[facility];  Service: Orthopedics;  Laterality: Left;  . HIP SURGERY    . right chest wall resection     of second ,third,and fourth rib  . TONSILLECTOMY    . trigeminal neuralgia    . UPPER GASTROINTESTINAL ENDOSCOPY      There were no vitals filed for this visit.      Subjective Assessment - 08/08/16 0805    Subjective Patient reports only sore in the groin after getting up from sitting.  Reports that he is trying to walk more wihtout walker at home   Currently in Pain? Yes   Pain Score 2    Pain Location Groin   Pain Orientation Left                         [facility] Adult PT Treatment/Exercise - 08/08/16 0001      Ambulation/Gait   Gait Comments gait with HHA today so I could speed him up and have him take bigger steps, then with SPC, cues for steps, did stairs  worked on step over step on the way up     High Level Balance   High Level Balance Activities Side stepping;Backward walking;Marching forwards  High Level Balance Comments worked a lot on getting him to get his center of gravity forward, reaching activities, stepping over obstacles, used smaller height obstacles today and he did better     Knee/Hip Exercises: Aerobic   Nustep level 6 x 6 minutes     Knee/Hip Exercises: Machines for Strengthening   Cybex Knee Extension 15# 3x10   Cybex Knee Flexion 25# 3x10     Knee/Hip Exercises: Standing   Hip Flexion 3 sets;10 reps   Hip Abduction 2 sets;15 reps;Both   Abduction Limitations 3#     Knee/Hip Exercises: Seated   Sit to Sand without UE support;20 reps     Moist Heat Therapy   Number Minutes Moist Heat 15 Minutes   Moist Heat Location Hip     Electrical Stimulation   Electrical Stimulation Location left groin   Electrical Stimulation Action IFC   Electrical Stimulation Parameters supine   Electrical Stimulation Goals Pain                  PT Short Term Goals - 07/09/16 2409      PT SHORT TERM GOAL #2    Title independent with intial HEP   Status Achieved           PT Long Term Goals - 08/01/16 0840      PT LONG TERM GOAL #1   Title decrease pain 50%   Status Partially Met     PT LONG TERM GOAL #2   Title decrease TUG time to 25 seconds   Status Partially Met     PT LONG TERM GOAL #3   Title walk with SPC 300 feet   Status Partially Met     PT LONG TERM GOAL #4   Title go up and down stairs step over step   Status Partially Met     PT LONG TERM GOAL #5   Title increase left hip strength to 4+/5   Status Partially Met               Plan - 08/08/16 0905    Clinical Impression Statement Patient much improved step length today, daughter thinks that maybe some of the issues could be sleep, when he does not sleep she thinks he does worse.  He did fatigue quickly today and then went back to shuffling and reaching for things to steady himself   PT Next Visit Plan Work on endurance and safety   Consulted and Agree with Plan of Care Patient      Patient will benefit from skilled therapeutic intervention in order to improve the following deficits and impairments:  Abnormal gait, Decreased activity tolerance, Cardiopulmonary status limiting activity, Decreased balance, Decreased mobility, Decreased endurance, Decreased range of motion, Decreased strength, Difficulty walking, Impaired flexibility, Pain  Visit Diagnosis: Pain in left hip  Difficulty in walking, not elsewhere classified  Repeated falls     Problem List Patient Active Problem List   Diagnosis Date Noted  . BPH (benign prostatic hypertrophy) 06/15/2016  . Restless leg syndrome 06/15/2016  . Edema 06/13/2016  . Altered mental status   . Delirium   . Hyponatremia 06/03/2016  . Left displaced femoral neck fracture ([place]) 06/02/2016  . Hip fracture ([place]) 06/02/2016  . Hordeolum externum (stye) 05/28/2016  . Hypokalemia 05/28/2016  . Dehydration 05/28/2016  . Pancytopenia ([place]) 05/28/2016  .  Hyperbilirubinemia 05/28/2016  . Cancer of true vocal cord ([place])   . Multiple myeloma ([place]) 06/23/2012  . Vocal cord  cancer ([place]) 12/24/2011  . Prostate cancer ([place]) 12/24/2011  . Multiple myeloma ([place]) 12/23/2011  . Essential hypertension, benign 10/10/2010  . ESOPHAGEAL STRICTURE 08/28/2010  . CHOLELITHIASIS 08/28/2010  . DYSPHAGIA UNSPECIFIED 08/28/2010  . ABNORMAL FINDINGS GI TRACT 08/28/2010  . GERD 08/23/2010  . CHOLELITHIASIS 08/23/2010  . HYPERTENSION, PULMONARY 08/10/2010  . ATRIAL FIBRILLATION 08/10/2010  . MURMUR 08/10/2010    [name]., PT 08/08/2016, 9:25 AM  [place], [place], [address] Phone: [phone]   Fax:  [phone]  Name: Richard [name] MRN: [id number] Date of Birth: 09-Nov-1933

## 2016-08-13 ENCOUNTER — Ambulatory Visit: Payer: Medicare Other | Attending: Orthopedic Surgery | Admitting: Physical Therapy

## 2016-08-13 ENCOUNTER — Encounter: Payer: Self-pay | Admitting: Physical Therapy

## 2016-08-13 DIAGNOSIS — R262 Difficulty in walking, not elsewhere classified: Secondary | ICD-10-CM

## 2016-08-13 DIAGNOSIS — R296 Repeated falls: Secondary | ICD-10-CM

## 2016-08-13 DIAGNOSIS — M25552 Pain in left hip: Secondary | ICD-10-CM | POA: Diagnosis present

## 2016-08-13 NOTE — Therapy (Signed)
[place], [place], [address] Phone: [phone]   Fax:  [phone]  Physical Therapy Treatment  Patient Details  Name: Richard [name] MRN: [id number] Date of Birth: 1933-03-26 Referring Provider: Alvan Dame  Encounter Date: 08/13/2016      PT End of Session - 08/13/16 0835    Visit Number 13   Date for PT Re-Evaluation 09/02/16   PT Start Time 0757   PT Stop Time 0856   PT Time Calculation (min) 59 min   Activity Tolerance Patient limited by fatigue;Patient limited by lethargy   Behavior During Therapy [facility] for tasks assessed/performed      Past Medical History:  Diagnosis Date  . Abnormal finding on GI tract imaging   . Anemia    pt's daughter denies  . Arrhythmia    atrial fibrillation  . Arthritis   . Atrial fibrillation ([place])   . 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 Cataract    bil removed  . Cholelithiasis   . Cholelithiasis   . Dehydration   . Dyslipidemia   . Dysphagia following unspecified cerebrovascular disease   . Esophageal stricture   . Esophageal stricture   . Essential hypertension, benign   . Facial basal cell cancer    nose  . GERD (gastroesophageal reflux disease)   . GERD (gastroesophageal reflux disease)   . Hiatal hernia   . History of BPH   . History of radiation therapy 01/29/11 -03/15/11   larynx 6600 cGy 33 sessions  . Hordeolum externum (stye)   . Hyperbilirubinemia   . Hypertension   . Hypokalemia   . Hyponatremia   . Left displaced femoral neck fracture ([place])   . Multiple myeloma   . Murmur   . Pancytopenia ([place])   . Pneumonia   . Prostate cancer ([place]) 01/23/11   Gleason 3+3=6  . Pulmonary hypertension ([place])     Past Surgical History:  Procedure Laterality Date  . APPENDECTOMY    . BACK SURGERY    . COLONOSCOPY    . ESOPHAGEAL DILATION    . HIP ARTHROPLASTY Left 06/03/2016   Procedure: ARTHROPLASTY BIPOLAR HIP LEFT   (HEMIARTHROPLASTY);  Surgeon: Paralee Cancel, MD;  Location: [facility];  Service: Orthopedics;  Laterality: Left;  . HIP SURGERY    . right chest wall resection     of second ,third,and fourth rib  . TONSILLECTOMY    . trigeminal neuralgia    . UPPER GASTROINTESTINAL ENDOSCOPY      There were no vitals filed for this visit.      Subjective Assessment - 08/13/16 0800    Subjective Patient continues to be sore, the daughter reports that he is on steroids on Monday's and that he ususally cannot sleep that night and then the next day (today) he does not do well   Currently in Pain? Yes   Pain Score 2    Pain Location Groin   Pain Orientation Left   Aggravating Factors  getting up from sitting                         [facility] Adult PT Treatment/Exercise - 08/13/16 0001      Ambulation/Gait   Gait Comments gait with HHA today so I could speed him up and have him take bigger steps, then with SPC, cues for steps, did stairs  worked on step over step on  the way up, outside around the building working on uneven surfaces. needed a lot of cues to day to pick up feet     High Level Balance   High Level Balance Activities Side stepping;Backward walking;Marching forwards   High Level Balance Comments worked a lot on getting him to get his center of gravity forward, reaching activities, stepping over obstacles, used smaller height obstacles today and he did better     Knee/Hip Exercises: Stretches   Press photographer 3 reps;20 seconds     Knee/Hip Exercises: Aerobic   Nustep level 6 x 6 minutes     Knee/Hip Exercises: Supine   Other Supine Knee/Hip Exercises feet on ball K2C, trunk rotation, and small bridges   Other Supine Knee/Hip Exercises glute squeeze     Moist Heat Therapy   Number Minutes Moist Heat 15 Minutes   Moist Heat Location Hip     Electrical Stimulation   Electrical Stimulation Location left groin   Electrical Stimulation Action IFC   Electrical Stimulation  Parameters supine   Electrical Stimulation Goals Pain                  PT Short Term Goals - 07/09/16 5462      PT SHORT TERM GOAL #2   Title independent with intial HEP   Status Achieved           PT Long Term Goals - 08/01/16 0840      PT LONG TERM GOAL #1   Title decrease pain 50%   Status Partially Met     PT LONG TERM GOAL #2   Title decrease TUG time to 25 seconds   Status Partially Met     PT LONG TERM GOAL #3   Title walk with SPC 300 feet   Status Partially Met     PT LONG TERM GOAL #4   Title go up and down stairs step over step   Status Partially Met     PT LONG TERM GOAL #5   Title increase left hip strength to 4+/5   Status Partially Met               Plan - 08/13/16 0836    Clinical Impression Statement Patient with again talking steroids yestercay and not sleeping last night, he takes the steroids for the cancer.  He then comes in and shuffles and has a lot of difficulty with any processing type activity, needing cues or he will be very unsafe and impulsive.  2 near fulls today with transfers as he would not move his feet to get close to the surface he was goingto   PT Next Visit Plan Work on Nature conservation officer and Agree with Plan of Care Patient      Patient will benefit from skilled therapeutic intervention in order to improve the following deficits and impairments:     Visit Diagnosis: Pain in left hip  Difficulty in walking, not elsewhere classified  Repeated falls     Problem List Patient Active Problem List   Diagnosis Date Noted  . BPH (benign prostatic hypertrophy) 06/15/2016  . Restless leg syndrome 06/15/2016  . Edema 06/13/2016  . Altered mental status   . Delirium   . Hyponatremia 06/03/2016  . Left displaced femoral neck fracture ([place]) 06/02/2016  . Hip fracture ([place]) 06/02/2016  . Hordeolum externum (stye) 05/28/2016  . Hypokalemia 05/28/2016  . Dehydration 05/28/2016  . Pancytopenia  ([place]) 05/28/2016  . Hyperbilirubinemia 05/28/2016  .  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 Multiple myeloma ([place]) 06/23/2012  . Vocal cord cancer ([place]) 12/24/2011  . Prostate cancer ([place]) 12/24/2011  . Multiple myeloma ([place]) 12/23/2011  . Essential hypertension, benign 10/10/2010  . ESOPHAGEAL STRICTURE 08/28/2010  . CHOLELITHIASIS 08/28/2010  . DYSPHAGIA UNSPECIFIED 08/28/2010  . ABNORMAL FINDINGS GI TRACT 08/28/2010  . GERD 08/23/2010  . CHOLELITHIASIS 08/23/2010  . HYPERTENSION, PULMONARY 08/10/2010  . ATRIAL FIBRILLATION 08/10/2010  . MURMUR 08/10/2010    [name]., PT 08/13/2016, 8:38 AM  [place], [place], [address] Phone: [phone]   Fax:  [phone]  Name: Richard [name] MRN: [id number] Date of Birth: 09-22-1933

## 2016-08-15 ENCOUNTER — Encounter: Payer: Self-pay | Admitting: Physical Therapy

## 2016-08-15 ENCOUNTER — Ambulatory Visit: Payer: Medicare Other | Admitting: Physical Therapy

## 2016-08-15 ENCOUNTER — Other Ambulatory Visit: Payer: Self-pay | Admitting: *Deleted

## 2016-08-15 DIAGNOSIS — R262 Difficulty in walking, not elsewhere classified: Secondary | ICD-10-CM

## 2016-08-15 DIAGNOSIS — M25552 Pain in left hip: Secondary | ICD-10-CM | POA: Diagnosis not present

## 2016-08-15 DIAGNOSIS — R296 Repeated falls: Secondary | ICD-10-CM

## 2016-08-15 MED ORDER — POMALIDOMIDE 3 MG PO CAPS: 3.0000 mg | ORAL_CAPSULE | Freq: Every day | ORAL | 0 refills | Status: DC

## 2016-08-15 NOTE — Therapy (Signed)
[place], [place], [address] Phone: [phone]   Fax:  [phone]  Physical Therapy Treatment  Patient Details  Name: Richard [name] MRN: [id number] Date of Birth: 1933/06/30 Referring Provider: Alvan Dame  Encounter Date: 08/15/2016      PT End of Session - 08/15/16 1350    Visit Number 14   Date for PT Re-Evaluation 09/02/16   PT Start Time 1308   PT Stop Time 1400   PT Time Calculation (min) 52 min   Activity Tolerance Patient limited by fatigue;Patient limited by lethargy   Behavior During Therapy [facility] for tasks assessed/performed;Impulsive      Past Medical History:  Diagnosis Date  . Abnormal finding on GI tract imaging   . Anemia    pt's daughter denies  . Arrhythmia    atrial fibrillation  . Arthritis   . Atrial fibrillation ([place])   . 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 Cataract    bil removed  . Cholelithiasis   . Cholelithiasis   . Dehydration   . Dyslipidemia   . Dysphagia following unspecified cerebrovascular disease   . Esophageal stricture   . Esophageal stricture   . Essential hypertension, benign   . Facial basal cell cancer    nose  . GERD (gastroesophageal reflux disease)   . GERD (gastroesophageal reflux disease)   . Hiatal hernia   . History of BPH   . History of radiation therapy 01/29/11 -03/15/11   larynx 6600 cGy 33 sessions  . Hordeolum externum (stye)   . Hyperbilirubinemia   . Hypertension   . Hypokalemia   . Hyponatremia   . Left displaced femoral neck fracture ([place])   . Multiple myeloma   . Murmur   . Pancytopenia ([place])   . Pneumonia   . Prostate cancer ([place]) 01/23/11   Gleason 3+3=6  . Pulmonary hypertension ([place])     Past Surgical History:  Procedure Laterality Date  . APPENDECTOMY    . BACK SURGERY    . COLONOSCOPY    . ESOPHAGEAL DILATION    . HIP ARTHROPLASTY Left 06/03/2016   Procedure: ARTHROPLASTY BIPOLAR HIP LEFT   (HEMIARTHROPLASTY);  Surgeon: Paralee Cancel, MD;  Location: [facility];  Service: Orthopedics;  Laterality: Left;  . HIP SURGERY    . right chest wall resection     of second ,third,and fourth rib  . TONSILLECTOMY    . trigeminal neuralgia    . UPPER GASTROINTESTINAL ENDOSCOPY      There were no vitals filed for this visit.      Subjective Assessment - 08/15/16 1310    Subjective Reports less tired today, walking better   Currently in Pain? Yes   Pain Score 2    Pain Location Groin                         [facility] Adult PT Treatment/Exercise - 08/15/16 0001      Ambulation/Gait   Gait Comments Gait with SPC and at times only HHA, cues verbal and tactile to speed up, take big steps, not creepa long furniture and to stop shuffling     Knee/Hip Exercises: Aerobic   Nustep level 6 x 6 minutes     Knee/Hip Exercises: Machines for Strengthening   Cybex Knee Extension 15# 3x10   Cybex Knee Flexion 25# 3x10   Cybex Leg Press 50# 2x10, then left  only 20# 2x10 with some assistance                  PT Short Term Goals - 07/09/16 2774      PT SHORT TERM GOAL #2   Title independent with intial HEP   Status Achieved           PT Long Term Goals - 08/15/16 1352      PT LONG TERM GOAL #2   Title decrease TUG time to 25 seconds   Status Partially Met               Plan - 08/15/16 1350    Clinical Impression Statement Patient very unsteady today, increased shuffling, when walking with cane he was all over side to side, when negotiationg a curb he needed mod A to not fall.  Lost balance several times around the building requiring assistance to right self.   PT Next Visit Plan asked daughter to look at the medications and see if any increase the risk for falls   Consulted and Agree with Plan of Care Patient      Patient will benefit from skilled therapeutic intervention in order to improve the following deficits and impairments:     Visit  Diagnosis: Pain in left hip  Difficulty in walking, not elsewhere classified  Repeated falls     Problem List Patient Active Problem List   Diagnosis Date Noted  . BPH (benign prostatic hypertrophy) 06/15/2016  . Restless leg syndrome 06/15/2016  . Edema 06/13/2016  . Altered mental status   . Delirium   . Hyponatremia 06/03/2016  . Left displaced femoral neck fracture ([place]) 06/02/2016  . Hip fracture ([place]) 06/02/2016  . Hordeolum externum (stye) 05/28/2016  . Hypokalemia 05/28/2016  . Dehydration 05/28/2016  . Pancytopenia ([place]) 05/28/2016  . Hyperbilirubinemia 05/28/2016  . Cancer of true vocal cord ([place])   . Multiple myeloma ([place]) 06/23/2012  . 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 Prostate cancer ([place]) 12/24/2011  . Multiple myeloma ([place]) 12/23/2011  . Essential hypertension, benign 10/10/2010  . ESOPHAGEAL STRICTURE 08/28/2010  . CHOLELITHIASIS 08/28/2010  . DYSPHAGIA UNSPECIFIED 08/28/2010  . ABNORMAL FINDINGS GI TRACT 08/28/2010  . GERD 08/23/2010  . CHOLELITHIASIS 08/23/2010  . HYPERTENSION, PULMONARY 08/10/2010  . ATRIAL FIBRILLATION 08/10/2010  . MURMUR 08/10/2010    [name]., PT 08/15/2016, 2:00 PM  [place], [place], [address] Phone: [phone]   Fax:  [phone]  Name: RAYMAR JOINER MRN: [id number] Date of Birth: 11-Sep-1933

## 2016-08-19 ENCOUNTER — Ambulatory Visit (HOSPITAL_BASED_OUTPATIENT_CLINIC_OR_DEPARTMENT_OTHER): Payer: Medicare Other | Admitting: Oncology

## 2016-08-19 ENCOUNTER — Telehealth: Payer: Self-pay | Admitting: Oncology

## 2016-08-19 ENCOUNTER — Other Ambulatory Visit (HOSPITAL_BASED_OUTPATIENT_CLINIC_OR_DEPARTMENT_OTHER): Payer: Medicare Other

## 2016-08-19 ENCOUNTER — Ambulatory Visit (HOSPITAL_BASED_OUTPATIENT_CLINIC_OR_DEPARTMENT_OTHER): Payer: Medicare Other

## 2016-08-19 VITALS — BP 159/73 | HR 82 | Temp 97.4°F | Resp 18 | Ht 68.0 in | Wt 166.2 lb

## 2016-08-19 DIAGNOSIS — C9002 Multiple myeloma in relapse: Secondary | ICD-10-CM

## 2016-08-19 DIAGNOSIS — C9000 Multiple myeloma not having achieved remission: Secondary | ICD-10-CM

## 2016-08-19 LAB — COMPREHENSIVE METABOLIC PANEL
ALT: 25 U/L (ref 0–55)
AST: 15 U/L (ref 5–34)
Albumin: 3.3 g/dL — ABNORMAL LOW (ref 3.5–5.0)
Alkaline Phosphatase: 86 U/L (ref 40–150)
Anion Gap: 8 mEq/L (ref 3–11)
BILIRUBIN TOTAL: 0.66 mg/dL (ref 0.20–1.20)
BUN: 23.5 mg/dL (ref 7.0–26.0)
CHLORIDE: 101 meq/L (ref 98–109)
CO2: 25 meq/L (ref 22–29)
CREATININE: 0.9 mg/dL (ref 0.7–1.3)
Calcium: 10.1 mg/dL (ref 8.4–10.4)
EGFR: 80 mL/min/{1.73_m2} — ABNORMAL LOW (ref >90)
GLUCOSE: 95 mg/dL (ref 70–140)
Potassium: 3.8 mEq/L (ref 3.5–5.1)
SODIUM: 135 meq/L — AB (ref 136–145)
TOTAL PROTEIN: 6.6 g/dL (ref 6.4–8.3)

## 2016-08-19 LAB — CBC WITH DIFFERENTIAL/PLATELET
BASO%: 0.8 % (ref 0.0–2.0)
Basophils Absolute: 0 10*3/uL (ref 0.0–0.1)
EOS%: 3.2 % (ref 0.0–7.0)
Eosinophils Absolute: 0.2 10*3/uL (ref 0.0–0.5)
HCT: 35.6 % — ABNORMAL LOW (ref 38.4–49.9)
HGB: 11.8 g/dL — ABNORMAL LOW (ref 13.0–17.1)
LYMPH%: 24.4 % (ref 14.0–49.0)
MCH: 30.3 pg (ref 27.2–33.4)
MCHC: 33.1 g/dL (ref 32.0–36.0)
MCV: 91.5 fL (ref 79.3–98.0)
MONO#: 1.1 10*3/uL — AB (ref 0.1–0.9)
MONO%: 21.5 % — ABNORMAL HIGH (ref 0.0–14.0)
NEUT%: 50.1 % (ref 39.0–75.0)
NEUTROS ABS: 2.6 10*3/uL (ref 1.5–6.5)
PLATELETS: 180 10*3/uL (ref 140–400)
RBC: 3.89 10*6/uL — AB (ref 4.20–5.82)
RDW: 18.8 % — ABNORMAL HIGH (ref 11.0–14.6)
WBC: 5.2 10*3/uL (ref 4.0–10.3)
lymph#: 1.3 10*3/uL (ref 0.9–3.3)

## 2016-08-19 MED ORDER — ZOLEDRONIC ACID 4 MG/100ML IV SOLN
4.0000 mg | Freq: Once | INTRAVENOUS | Status: AC
  Administered 2016-08-19: 4 mg via INTRAVENOUS
  Filled 2016-08-19: qty 100

## 2016-08-19 NOTE — Telephone Encounter (Signed)
Avs report and schd given per 08/19/16 los °

## 2016-08-19 NOTE — Progress Notes (Addendum)
[facility] OFFICE PROGRESS NOTE   Diagnosis: Multiple myeloma  INTERVAL HISTORY:   Richard [name] returns as scheduled. He began a third cycle of pomalidomide/Decadron on 07/25/2016. He has developed pain at the upper neck. He saw Dr. Ellene Route and received a steroid injection without relief. The pain is worse when he turns his head. He is now undergoing physical therapy and the pain has improved. The left groin pain has also improved. Richard [name] continues physical therapy for balance training.  Objective:  Vital signs in last 24 hours:  Blood pressure (!) 159/73, pulse 82, temperature 97.4 F (36.3 C), temperature source Oral, resp. rate 18, height _0  (1.727 m), weight 166 lb 3.2 oz (75.4 kg), SpO2 98 %.    HEENT: No thrush or ulcers Resp: Lungs clear bilaterally Cardio: Regular rate and rhythm GI: No hepatosplenomegaly, nontender Vascular: No leg edema Musculoskeletal: The area of discomfort as at the low occiput bilaterally, no tenderness     Lab Results:  Lab Results  Component Value Date   WBC 5.2 08/19/2016   HGB 11.8 (L) 08/19/2016   HCT 35.6 (L) 08/19/2016   MCV 91.5 08/19/2016   PLT 180 08/19/2016   NEUTROABS 2.6 08/19/2016    Lambda free light chains on 07/22/2016-30.6  Medications: I have reviewed the patient's current medications.  Assessment/Plan: 1. Multiple myeloma: The serum free light chains were elevated 08/29/2009. A CT of the chest 08/31/2009 showed a new pleural mass consistent with progression of multiple myeloma. He completed 12 cycles of Revlimid/Decadron. The serum free light chains were improved to 4.34 on 12/29/2009. A restaging CT of the chest 02/19/2010 showed resolution of right axillary lymphadenopathy and a pleural-based mass. The serum free lambda light chains were again elevated.  Salvage Revlimid/Decadron initiated 12/08/2014  Cycle 2 01/05/2015  Serum light chains improved 01/24/2015  Cycle 3 02/01/2015  Cycle  4 03/02/2015  Cycle 5 03/30/2015  Cycle 6 04/27/2015  Cycle 7 05/25/2015  Cycle 8 06/22/2015  Cycle 9 07/20/2015  Cycle 10 08/18/2015  Changed to maintenance Revlimid beginning 09/23/2015  Progressive anemia and Elevated serum free light chains 11/13/2015  Changed to weekly Cytoxan/Velcade/Decadron beginning 11/21/2015, changed to 3/4 week schedule beginning 01/02/2016  Serum light chains improved on 03/26/2016  Serum light chains increased 04/23/2016  Serum light chains increased 05/21/2016  Cycle 1 pomalidomide/decadron 05/30/2016  Cycle 2 pomalidomide/Decadron 06/27/2016  Serum light chains improved 07/03/2016  Cycle 3 Pomalidomide/Decadron 07/25/2016  Cycle 4 Pomalidomide/Decadron 08/22/2016  2.History of back pain, likely related to the pleural-based mass at the right chest, resolved. 2. Right axillary/subpectoral lymphadenopathy on a CT of the chest 08/31/2009, likely related to multiple myeloma, resolved. 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 He is maintained on every three-month Zometa. 4. Pancytopenia secondary to Revlimid and multiple myeloma. He has persistent mild thrombocytopenia. 5. Hypertension. 6. Status post hip replacement. 7. History of back surgery. 8. History of trigeminal neuralgia. 9. Status post removal of a lipoma from the right axilla in February 2010. 10. 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 11. Esophageal reflux disease, followed by Dr. Janace Hoard.  12. Esophageal stricture, status post an evaluation by Dr. Henrene Pastor. 13. Right vocal cord lesion, status post a biopsy by Dr. 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 He completed radiation under the direction of Dr. Valere Dross on 03/15/2011. 14. Admission with pneumonia 03/26/2010. 15. History of Atrial flutter/fibrillation while hospitalized August 2011. He is followed by Dr. Percival Spanish. 16. Elevated prostate specific  antigen,  status post a biopsy 01/23/2011. He was found to have a Gleason 6 cancer in 10% of the cores. He is followed with an observation approach. He is now followed by Dr. Diona Fanti. 17. History of Mild hypercalcemia  18. Fall with a left wrist fracture June 2015  19. Right chest wall pain secondary to a right second rib plasmacytoma confirmed on a CT 11/28/2014, resolved 20. History of Hyponatremia 21.  history of Thrombocytopenia secondary progression of multiple myeloma and chemotherapy 22. Fall with left femoral neck fracture 06/02/2016, left hemiarthroplasty 06/03/2016 23. Hospital delirium June 2017     Disposition:  Richard [name] continues treatment with pomalidomide and Decadron. He appears to be tolerating the treatment well. The anemia and thrombocytopenia have improved. We will follow-up on the light chains from today.  He will begin the next cycle on 08/22/2016. He will return for an office visit on 09/12/2009.  [name] will receive Zometa today. We will be sure he is up-to-date on the influenza and pneumonia vaccines.    Betsy Coder, MD  08/19/2016  10:34 AM

## 2016-08-20 ENCOUNTER — Ambulatory Visit: Payer: Medicare Other | Admitting: Physical Therapy

## 2016-08-20 ENCOUNTER — Encounter: Payer: Self-pay | Admitting: Physical Therapy

## 2016-08-20 DIAGNOSIS — R296 Repeated falls: Secondary | ICD-10-CM

## 2016-08-20 DIAGNOSIS — M25552 Pain in left hip: Secondary | ICD-10-CM | POA: Diagnosis not present

## 2016-08-20 DIAGNOSIS — R262 Difficulty in walking, not elsewhere classified: Secondary | ICD-10-CM

## 2016-08-20 LAB — KAPPA/LAMBDA LIGHT CHAINS
IG KAPPA FREE LIGHT CHAIN: 22.5 mg/L — AB (ref 3.3–19.4)
IG LAMBDA FREE LIGHT CHAIN: 33 mg/L — AB (ref 5.7–26.3)
Kappa/Lambda FluidC Ratio: 0.68 (ref 0.26–1.65)

## 2016-08-20 NOTE — Therapy (Signed)
[place], [place], [address] Phone: [phone]   Fax:  [phone]  Physical Therapy Treatment  Patient Details  Name: Richard [name] MRN: [id number] Date of Birth: 05-13-1933 Referring Provider: Alvan Dame  Encounter Date: 08/20/2016      PT End of Session - 08/20/16 0847    Visit Number 15   Date for PT Re-Evaluation 09/02/16   PT Start Time 0759   PT Stop Time 0849   PT Time Calculation (min) 50 min   Activity Tolerance Patient tolerated treatment well   Behavior During Therapy [facility] for tasks assessed/performed;Impulsive      Past Medical History:  Diagnosis Date  . Abnormal finding on GI tract imaging   . Anemia    pt's daughter denies  . Arrhythmia    atrial fibrillation  . Arthritis   . Atrial fibrillation ([place])   . 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 Cataract    bil removed  . Cholelithiasis   . Cholelithiasis   . Dehydration   . Dyslipidemia   . Dysphagia following unspecified cerebrovascular disease   . Esophageal stricture   . Esophageal stricture   . Essential hypertension, benign   . Facial basal cell cancer    nose  . GERD (gastroesophageal reflux disease)   . GERD (gastroesophageal reflux disease)   . Hiatal hernia   . History of BPH   . History of radiation therapy 01/29/11 -03/15/11   larynx 6600 cGy 33 sessions  . Hordeolum externum (stye)   . Hyperbilirubinemia   . Hypertension   . Hypokalemia   . Hyponatremia   . Left displaced femoral neck fracture ([place])   . Multiple myeloma   . Murmur   . Pancytopenia ([place])   . Pneumonia   . Prostate cancer ([place]) 01/23/11   Gleason 3+3=6  . Pulmonary hypertension ([place])     Past Surgical History:  Procedure Laterality Date  . APPENDECTOMY    . BACK SURGERY    . COLONOSCOPY    . ESOPHAGEAL DILATION    . HIP ARTHROPLASTY Left 06/03/2016   Procedure: ARTHROPLASTY BIPOLAR HIP LEFT  (HEMIARTHROPLASTY);   Surgeon: Paralee Cancel, MD;  Location[facility];  Service: Orthopedics;  Laterality: Left;  . HIP SURGERY    . right chest wall resection     of second ,third,and fourth rib  . TONSILLECTOMY    . trigeminal neuralgia    . UPPER GASTROINTESTINAL ENDOSCOPY      There were no vitals filed for this visit.      Subjective Assessment - 08/20/16 0824    Subjective No falls.   Currently in Pain? Yes   Pain Score 5    Pain Location Neck   Aggravating Factors  unsure                         [facility] Adult PT Treatment/Exercise - 08/20/16 0001      Ambulation/Gait   Gait Comments Gait with SPC and at times only HHA, cues verbal and tactile to speed up, take big steps, not creepa long furniture and to stop shuffling     High Level Balance   High Level Balance Activities Side stepping;Backward walking   High Level Balance Comments reaching different places with different hands, ball tossing     Knee/Hip Exercises: Aerobic   Recumbent Bike x 6 minutes level 0  Knee/Hip Exercises: Machines for Strengthening   Cybex Knee Extension 15# 3x10   Cybex Knee Flexion 25# 3x10   Cybex Leg Press 50# 2x10, then left only 20# 2x10 with some assistance                  PT Short Term Goals - 07/09/16 8616      PT SHORT TERM GOAL #2   Title independent with intial HEP   Status Achieved           PT Long Term Goals - 08/15/16 1352      PT LONG TERM GOAL #2   Title decrease TUG time to 25 seconds   Status Partially Met               Plan - 08/20/16 0848    Clinical Impression Statement Patient better today than last week, less lethargy, better mobility, a little safer.  Still tends to have an extensor pattern when standing   PT Next Visit Plan he is on 5 different medications and about 6 different supplements, continue with the balance and safety   Consulted and Agree with Plan of Care Patient      Patient will benefit from skilled therapeutic  intervention in order to improve the following deficits and impairments:  Abnormal gait, Decreased activity tolerance, Cardiopulmonary status limiting activity, Decreased balance, Decreased mobility, Decreased endurance, Decreased range of motion, Decreased strength, Difficulty walking, Impaired flexibility, Pain  Visit Diagnosis: Pain in left hip  Difficulty in walking, not elsewhere classified  Repeated falls     Problem List Patient Active Problem List   Diagnosis Date Noted  . BPH (benign prostatic hypertrophy) 06/15/2016  . Restless leg syndrome 06/15/2016  . Edema 06/13/2016  . Altered mental status   . Delirium   . Hyponatremia 06/03/2016  . Left displaced femoral neck fracture ([place]) 06/02/2016  . Hip fracture ([place]) 06/02/2016  . Hordeolum externum (stye) 05/28/2016  . Hypokalemia 05/28/2016  . Dehydration 05/28/2016  . Pancytopenia ([place]) 05/28/2016  . Hyperbilirubinemia 05/28/2016  . Cancer of true vocal cord ([place])   . Multiple myeloma ([place]) 06/23/2012  . 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 Prostate cancer ([place]) 12/24/2011  . Multiple myeloma ([place]) 12/23/2011  . Essential hypertension, benign 10/10/2010  . ESOPHAGEAL STRICTURE 08/28/2010  . CHOLELITHIASIS 08/28/2010  . DYSPHAGIA UNSPECIFIED 08/28/2010  . ABNORMAL FINDINGS GI TRACT 08/28/2010  . GERD 08/23/2010  . CHOLELITHIASIS 08/23/2010  . HYPERTENSION, PULMONARY 08/10/2010  . ATRIAL FIBRILLATION 08/10/2010  . MURMUR 08/10/2010    [name]., PT 08/20/2016, 8:50 AM  [place], [place], [address] Phone: [phone]   Fax:  [phone]  Name: EJAY LASHLEY MRN: [id number] Date of Birth: [birth year]

## 2016-08-22 ENCOUNTER — Ambulatory Visit: Payer: Medicare Other | Admitting: Physical Therapy

## 2016-08-22 ENCOUNTER — Encounter: Payer: Self-pay | Admitting: Physical Therapy

## 2016-08-22 DIAGNOSIS — M25552 Pain in left hip: Secondary | ICD-10-CM

## 2016-08-22 DIAGNOSIS — R262 Difficulty in walking, not elsewhere classified: Secondary | ICD-10-CM

## 2016-08-22 DIAGNOSIS — R296 Repeated falls: Secondary | ICD-10-CM

## 2016-08-22 NOTE — Therapy (Signed)
[place], [place], [address] Phone: [phone]   Fax:  [phone]  Physical Therapy Treatment  Patient Details  Name: Richard [name] MRN: [id number] Date of Birth: 12-04-1933 Referring Provider: Alvan Dame  Encounter Date: 08/22/2016      PT End of Session - 08/22/16 0833    Visit Number 16   Date for PT Re-Evaluation 09/02/16   PT Start Time 0753   PT Stop Time 0845   PT Time Calculation (min) 52 min   Activity Tolerance Patient tolerated treatment well   Behavior During Therapy [facility] for tasks assessed/performed;Impulsive      Past Medical History:  Diagnosis Date  . Abnormal finding on GI tract imaging   . Anemia    pt's daughter denies  . Arrhythmia    atrial fibrillation  . Arthritis   . Atrial fibrillation ([place])   . 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 Cataract    bil removed  . Cholelithiasis   . Cholelithiasis   . Dehydration   . Dyslipidemia   . Dysphagia following unspecified cerebrovascular disease   . Esophageal stricture   . Esophageal stricture   . Essential hypertension, benign   . Facial basal cell cancer    nose  . GERD (gastroesophageal reflux disease)   . GERD (gastroesophageal reflux disease)   . Hiatal hernia   . History of BPH   . History of radiation therapy 01/29/11 -03/15/11   larynx 6600 cGy 33 sessions  . Hordeolum externum (stye)   . Hyperbilirubinemia   . Hypertension   . Hypokalemia   . Hyponatremia   . Left displaced femoral neck fracture ([place])   . Multiple myeloma   . Murmur   . Pancytopenia ([place])   . Pneumonia   . Prostate cancer ([place]) 01/23/11   Gleason 3+3=6  . Pulmonary hypertension ([place])     Past Surgical History:  Procedure Laterality Date  . APPENDECTOMY    . BACK SURGERY    . COLONOSCOPY    . ESOPHAGEAL DILATION    . HIP ARTHROPLASTY Left 06/03/2016   Procedure: ARTHROPLASTY BIPOLAR HIP LEFT  (HEMIARTHROPLASTY);   Surgeon: Paralee Cancel, MD;  [facility];  Service: Orthopedics;  Laterality: Left;  . HIP SURGERY    . right chest wall resection     of second ,third,and fourth rib  . TONSILLECTOMY    . trigeminal neuralgia    . UPPER GASTROINTESTINAL ENDOSCOPY      There were no vitals filed for this visit.      Subjective Assessment - 08/22/16 0812    Subjective No falls, patient reports that someone told him he had short term memory loss, which is probably true as he has difficulty recalling words and things that happened this AM, but remembers growing up.   Currently in Pain? No/denies                         [facility] Adult PT Treatment/Exercise - 08/22/16 0001      Ambulation/Gait   Gait Comments Gait with and without SPC, outside, negotiate curbs and slopes, cues for bigger steps and especially with the curbs     High Level Balance   High Level Balance Activities Side stepping;Backward walking   High Level Balance Comments reaching different places with different hands, ball tossing     Knee/Hip Exercises: Aerobic   Recumbent  Bike x 6 minutes level 0   Nustep level 6 x 6 minutes   Other Aerobic UBE level 5 x 4 minutes     Knee/Hip Exercises: Machines for Strengthening   Cybex Knee Extension 15# 3x10   Cybex Knee Flexion 35# 3x10   Cybex Leg Press 50# 2x10, then left only 20# 2x10 with some assistance                  PT Short Term Goals - 07/09/16 2800      PT SHORT TERM GOAL #2   Title independent with intial HEP   Status Achieved           PT Long Term Goals - 08/15/16 1352      PT LONG TERM GOAL #2   Title decrease TUG time to 25 seconds   Status Partially Met               Plan - 08/22/16 0834    Clinical Impression Statement Much better with walking outside, he was more thoughtful with safety especially with curbs.  Legs are doing well, he jsut continues to have the extensor pattern   PT Next Visit Plan Daughter reports that  he is off the cancer medication this week, he did better with function but seems worse with memory and word recall   Consulted and Agree with Plan of Care Patient      Patient will benefit from skilled therapeutic intervention in order to improve the following deficits and impairments:  Abnormal gait, Decreased activity tolerance, Cardiopulmonary status limiting activity, Decreased balance, Decreased mobility, Decreased endurance, Decreased range of motion, Decreased strength, Difficulty walking, Impaired flexibility, Pain  Visit Diagnosis: Pain in left hip  Difficulty in walking, not elsewhere classified  Repeated falls     Problem List Patient Active Problem List   Diagnosis Date Noted  . BPH (benign prostatic hypertrophy) 06/15/2016  . Restless leg syndrome 06/15/2016  . Edema 06/13/2016  . Altered mental status   . Delirium   . Hyponatremia 06/03/2016  . Left displaced femoral neck fracture ([place]) 06/02/2016  . Hip fracture ([place]) 06/02/2016  . Hordeolum externum (stye) 05/28/2016  . Hypokalemia 05/28/2016  . Dehydration 05/28/2016  . Pancytopenia ([place]) 05/28/2016  . Hyperbilirubinemia 05/28/2016  . 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 Multiple myeloma ([place]) 06/23/2012  . 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 Prostate cancer ([place]) 12/24/2011  . Multiple myeloma ([place]) 12/23/2011  . Essential hypertension, benign 10/10/2010  . ESOPHAGEAL STRICTURE 08/28/2010  . CHOLELITHIASIS 08/28/2010  . DYSPHAGIA UNSPECIFIED 08/28/2010  . ABNORMAL FINDINGS GI TRACT 08/28/2010  . GERD 08/23/2010  . CHOLELITHIASIS 08/23/2010  . HYPERTENSION, PULMONARY 08/10/2010  . ATRIAL FIBRILLATION 08/10/2010  . MURMUR 08/10/2010    [name]., PT 08/22/2016, 8:49 AM  [facility] [place], [place], [address] Phone: [phone]   Fax:  [phone]  Name: Richard [name] MRN: [id number] Date of Birth:  02-27-1933

## 2016-08-23 ENCOUNTER — Telehealth: Payer: Self-pay

## 2016-08-23 NOTE — Telephone Encounter (Signed)
Richard [name] with biologics has been helping daughter. His direct # is [phone].

## 2016-08-23 NOTE — Telephone Encounter (Signed)
Daughter called requesting information on where in process are the papers for [employer]ics to do financial analysis and requalify her dad for finanacial assitance. She has done her part and the papers were faxed to Dr Benay Spice for him to fill out MD section. Daughter stated they were faxed about 2 days ago. LVM on oral chemo line.

## 2016-08-27 ENCOUNTER — Ambulatory Visit: Payer: Medicare Other | Admitting: Physical Therapy

## 2016-08-27 ENCOUNTER — Encounter: Payer: Self-pay | Admitting: Physical Therapy

## 2016-08-27 DIAGNOSIS — R262 Difficulty in walking, not elsewhere classified: Secondary | ICD-10-CM

## 2016-08-27 DIAGNOSIS — R296 Repeated falls: Secondary | ICD-10-CM

## 2016-08-27 DIAGNOSIS — M25552 Pain in left hip: Secondary | ICD-10-CM

## 2016-08-27 NOTE — Therapy (Signed)
[place], [place], [address] Phone: [phone]   Fax:  [phone]  Physical Therapy Treatment  Patient Details  Name: Richard [name] MRN: [id number] Date of Birth: 02-Jan-1933 Referring Provider: Alvan Dame  Encounter Date: 08/27/2016      PT End of Session - 08/27/16 0850    Visit Number 17   Date for PT Re-Evaluation 09/02/16   PT Start Time 0807   PT Stop Time 0905   PT Time Calculation (min) 58 min   Activity Tolerance Patient tolerated treatment well   Behavior During Therapy [facility] for tasks assessed/performed;Impulsive      Past Medical History:  Diagnosis Date  . Abnormal finding on GI tract imaging   . Anemia    pt's daughter denies  . Arrhythmia    atrial fibrillation  . Arthritis   . Atrial fibrillation ([place])   . 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 Cataract    bil removed  . Cholelithiasis   . Cholelithiasis   . Dehydration   . Dyslipidemia   . Dysphagia following unspecified cerebrovascular disease   . Esophageal stricture   . Esophageal stricture   . Essential hypertension, benign   . Facial basal cell cancer    nose  . GERD (gastroesophageal reflux disease)   . GERD (gastroesophageal reflux disease)   . Hiatal hernia   . History of BPH   . History of radiation therapy 01/29/11 -03/15/11   larynx 6600 cGy 33 sessions  . Hordeolum externum (stye)   . Hyperbilirubinemia   . Hypertension   . Hypokalemia   . Hyponatremia   . Left displaced femoral neck fracture ([place])   . Multiple myeloma   . Murmur   . Pancytopenia ([place])   . Pneumonia   . Prostate cancer ([place]) 01/23/11   Gleason 3+3=6  . Pulmonary hypertension ([place])     Past Surgical History:  Procedure Laterality Date  . APPENDECTOMY    . BACK SURGERY    . COLONOSCOPY    . ESOPHAGEAL DILATION    . HIP ARTHROPLASTY Left 06/03/2016   Procedure: ARTHROPLASTY BIPOLAR HIP LEFT  (HEMIARTHROPLASTY);   Surgeon: Paralee Cancel, MD;  Location: [facility];  Service: Orthopedics;  Laterality: Left;  . HIP SURGERY    . right chest wall resection     of second ,third,and fourth rib  . TONSILLECTOMY    . trigeminal neuralgia    . UPPER GASTROINTESTINAL ENDOSCOPY      There were no vitals filed for this visit.      Subjective Assessment - 08/27/16 0809    Subjective ally sore after the last treatment in the groin   Currently in Pain? Yes   Pain Score 3    Pain Location Groin   Pain Orientation Left   Aggravating Factors  getting up and down for the toilet                         [facility] Adult PT Treatment/Exercise - 08/27/16 0001      Ambulation/Gait   Gait Comments Gait with and without SPC, outside, negotiate curbs and slopes, cues for bigger steps and especially with the curbs     High Level Balance   High Level Balance Comments reaching different places with different hands, ball tossing, standing on airex     Knee/Hip Exercises: Machines for Strengthening   Cybex Knee  Extension 15# 3x10   Cybex Leg Press 50# 2x10, then left only 20# 2x10 with some assistance     Moist Heat Therapy   Number Minutes Moist Heat 15 Minutes   Moist Heat Location Hip     Electrical Stimulation   Electrical Stimulation Location left groin   Electrical Stimulation Action IFC   Electrical Stimulation Parameters supine   Electrical Stimulation Goals Pain                  PT Short Term Goals - 07/09/16 4315      PT SHORT TERM GOAL #2   Title independent with intial HEP   Status Achieved           PT Long Term Goals - 08/27/16 0857      PT LONG TERM GOAL #1   Title decrease pain 50%   Status Partially Met     PT LONG TERM GOAL #2   Title decrease TUG time to 25 seconds   Status Achieved     PT LONG TERM GOAL #3   Title walk with SPC 300 feet   Status Achieved     PT LONG TERM GOAL #5   Title increase left hip strength to 4+/5   Status Partially Met                Plan - 08/27/16 0852    Clinical Impression Statement Patient has not been on the cancer medication in about 10 days.  He was able to do better with steps, around the building he gets fatigued about 2/3 of the way around.  He did well with balance today on solid surfaces, still difficulty with the airex.   PT Next Visit Plan I spoke to the daughter about gym activity when we D/C she has checked on one gym   Consulted and Agree with Plan of Care Patient      Patient will benefit from skilled therapeutic intervention in order to improve the following deficits and impairments:  Abnormal gait, Decreased activity tolerance, Cardiopulmonary status limiting activity, Decreased balance, Decreased mobility, Decreased endurance, Decreased range of motion, Decreased strength, Difficulty walking, Impaired flexibility, Pain  Visit Diagnosis: Pain in left hip  Difficulty in walking, not elsewhere classified  Repeated falls     Problem List Patient Active Problem List   Diagnosis Date Noted  . BPH (benign prostatic hypertrophy) 06/15/2016  . Restless leg syndrome 06/15/2016  . Edema 06/13/2016  . Altered mental status   . Delirium   . Hyponatremia 06/03/2016  . Left displaced femoral neck fracture ([place]) 06/02/2016  . Hip fracture ([place]) 06/02/2016  . Hordeolum externum (stye) 05/28/2016  . Hypokalemia 05/28/2016  . Dehydration 05/28/2016  . Pancytopenia ([place]) 05/28/2016  . Hyperbilirubinemia 05/28/2016  . Cancer of true vocal cord ([place])   . Multiple myeloma ([place]) 06/23/2012  . Vocal cord cancer ([place]) 12/24/2011  . Prostate cancer ([place]) 12/24/2011  . Multiple myeloma ([place]) 12/23/2011  . Essential hypertension, benign 10/10/2010  . ESOPHAGEAL STRICTURE 08/28/2010  . CHOLELITHIASIS 08/28/2010  . DYSPHAGIA UNSPECIFIED 08/28/2010  . ABNORMAL FINDINGS GI TRACT 08/28/2010  . GERD 08/23/2010  . CHOLELITHIASIS 08/23/2010  . HYPERTENSION, PULMONARY 08/10/2010  . ATRIAL  FIBRILLATION 08/10/2010  . MURMUR 08/10/2010    [name]., PT 08/27/2016, 9:03 AM  [facility] [place] [address] [place], [place], [address] Phone: [phone]   Fax:  [phone]  Name: Richard [name] MRN: [id number] Date  of Birth: July 26, 1933

## 2016-08-28 ENCOUNTER — Ambulatory Visit: Payer: Medicare Other | Admitting: Physical Therapy

## 2016-08-28 ENCOUNTER — Telehealth: Payer: Self-pay | Admitting: *Deleted

## 2016-08-28 NOTE — Telephone Encounter (Signed)
Call from pt's daughter to follow up on pt assistance forms. Noted form was faxed to [employer] on 9/18. Left message for Gabe at [employer] to call with update on pt's Pomalyst co-pay assistance.

## 2016-08-28 NOTE — Telephone Encounter (Signed)
Notified Gabe at [employer] that [employer] form was faxed on 9/18. Received message that they need a copy of pt's insurance card. Copy faxed electronically to [employer] and to [employer]. Called pt's daughter with update.

## 2016-08-29 ENCOUNTER — Encounter: Payer: Self-pay | Admitting: Physical Therapy

## 2016-08-29 ENCOUNTER — Ambulatory Visit: Payer: Medicare Other | Admitting: Physical Therapy

## 2016-08-29 DIAGNOSIS — R262 Difficulty in walking, not elsewhere classified: Secondary | ICD-10-CM

## 2016-08-29 DIAGNOSIS — M25552 Pain in left hip: Secondary | ICD-10-CM | POA: Diagnosis not present

## 2016-08-29 DIAGNOSIS — R296 Repeated falls: Secondary | ICD-10-CM

## 2016-08-29 NOTE — Therapy (Signed)
[place], [place], [address] Phone: [phone]   Fax:  [phone]  Physical Therapy Treatment  Patient Details  Name: Richard [name] MRN: [id number] Date of Birth: 05-20-1933 Referring Provider: Alvan Dame  Encounter Date: 08/29/2016      PT End of Session - 08/29/16 1352    Visit Number 18   Date for PT Re-Evaluation 09/02/16   PT Start Time 1300   PT Stop Time 1400   PT Time Calculation (min) 60 min   Activity Tolerance Patient tolerated treatment well   Behavior During Therapy [facility] for tasks assessed/performed;Impulsive      Past Medical History:  Diagnosis Date  . Abnormal finding on GI tract imaging   . Anemia    pt's daughter denies  . Arrhythmia    atrial fibrillation  . Arthritis   . Atrial fibrillation ([place])   . 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 Cataract    bil removed  . Cholelithiasis   . Cholelithiasis   . Dehydration   . Dyslipidemia   . Dysphagia following unspecified cerebrovascular disease   . Esophageal stricture   . Esophageal stricture   . Essential hypertension, benign   . Facial basal cell cancer    nose  . GERD (gastroesophageal reflux disease)   . GERD (gastroesophageal reflux disease)   . Hiatal hernia   . History of BPH   . History of radiation therapy 01/29/11 -03/15/11   larynx 6600 cGy 33 sessions  . Hordeolum externum (stye)   . Hyperbilirubinemia   . Hypertension   . Hypokalemia   . Hyponatremia   . Left displaced femoral neck fracture ([place])   . Multiple myeloma   . Murmur   . Pancytopenia ([place])   . Pneumonia   . Prostate cancer ([place]) 01/23/11   Gleason 3+3=6  . Pulmonary hypertension ([place])     Past Surgical History:  Procedure Laterality Date  . APPENDECTOMY    . BACK SURGERY    . COLONOSCOPY    . ESOPHAGEAL DILATION    . HIP ARTHROPLASTY Left 06/03/2016   Procedure: ARTHROPLASTY BIPOLAR HIP LEFT  (HEMIARTHROPLASTY);   Surgeon: Paralee Cancel, MD;  Location[facility];  Service: Orthopedics;  Laterality: Left;  . HIP SURGERY    . right chest wall resection     of second ,third,and fourth rib  . TONSILLECTOMY    . trigeminal neuralgia    . UPPER GASTROINTESTINAL ENDOSCOPY      There were no vitals filed for this visit.      Subjective Assessment - 08/29/16 1301    Subjective Continues to report some soreness, he is still not on the cancer medication, he has been walking some without walker at home with his daughter   Currently in Pain? Yes   Pain Score 4    Pain Location Groin   Pain Orientation Left   Aggravating Factors  walking                         [facility] Adult PT Treatment/Exercise - 08/29/16 0001      Ambulation/Gait   Gait Comments all gait today without any device     High Level Balance   High Level Balance Activities Side stepping;Backward walking;Tandem walking;Marching forwards;Direction changes   High Level Balance Comments reaching different places with different hands, ball tossing, standing on airex, heel walk, toe walk,  obstacle course     Knee/Hip Exercises: Aerobic   Nustep level 6 x 6 minutes     Knee/Hip Exercises: Machines for Strengthening   Cybex Knee Extension 15# 3x10   Cybex Knee Flexion 35# 3x10   Cybex Leg Press 50# 2x10, then left only 20# 2x10 with some assistance     Moist Heat Therapy   Number Minutes Moist Heat 15 Minutes   Moist Heat Location Hip     Electrical Stimulation   Electrical Stimulation Location left groin   Electrical Stimulation Action IFC   Electrical Stimulation Parameters supine   Electrical Stimulation Goals Pain                  PT Short Term Goals - 07/09/16 3559      PT SHORT TERM GOAL #2   Title independent with intial HEP   Status Achieved           PT Long Term Goals - 08/29/16 1406      PT LONG TERM GOAL #1   Title decrease pain 50%   Status Partially Met     PT LONG TERM GOAL #3    Title walk with SPC 300 feet   Status Achieved               Plan - 08/29/16 1402    Clinical Impression Statement Patient still not on the cancer medication.  He did the best that I have seen him do, he had much less to no extensor pattern, he was able to get on and off the leg press without issue.  He did all activity today with no device.   PT Next Visit Plan possible 1-2 more weeks aswe set up the gym program for her   Consulted and Agree with Plan of Care Patient      Patient will benefit from skilled therapeutic intervention in order to improve the following deficits and impairments:  Abnormal gait, Decreased activity tolerance, Cardiopulmonary status limiting activity, Decreased balance, Decreased mobility, Decreased endurance, Decreased range of motion, Decreased strength, Difficulty walking, Impaired flexibility, Pain  Visit Diagnosis: Pain in left hip  Difficulty in walking, not elsewhere classified  Repeated falls     Problem List Patient Active Problem List   Diagnosis Date Noted  . BPH (benign prostatic hypertrophy) 06/15/2016  . Restless leg syndrome 06/15/2016  . Edema 06/13/2016  . Altered mental status   . Delirium   . Hyponatremia 06/03/2016  . Left displaced femoral neck fracture ([place]) 06/02/2016  . Hip fracture ([place]) 06/02/2016  . Hordeolum externum (stye) 05/28/2016  . Hypokalemia 05/28/2016  . Dehydration 05/28/2016  . Pancytopenia ([place]) 05/28/2016  . Hyperbilirubinemia 05/28/2016  . Cancer of true vocal cord ([place])   . Multiple myeloma ([place]) 06/23/2012  . Vocal cord cancer ([place]) 12/24/2011  . Prostate cancer ([place]) 12/24/2011  . Multiple myeloma ([place]) 12/23/2011  . Essential hypertension, benign 10/10/2010  . ESOPHAGEAL STRICTURE 08/28/2010  . CHOLELITHIASIS 08/28/2010  . DYSPHAGIA UNSPECIFIED 08/28/2010  . ABNORMAL FINDINGS GI TRACT 08/28/2010  . GERD 08/23/2010  . CHOLELITHIASIS 08/23/2010  . HYPERTENSION, PULMONARY 08/10/2010  .  ATRIAL FIBRILLATION 08/10/2010  . MURMUR 08/10/2010    [name]., PT 08/29/2016, 2:07 PM  [place], [place], [address] Phone: [phone]   Fax:  [phone]  Name: Richard [name] MRN: [id number] Date of Birth: Jul 06, 1933

## 2016-09-03 ENCOUNTER — Ambulatory Visit: Payer: Medicare Other | Admitting: Physical Therapy

## 2016-09-03 ENCOUNTER — Encounter: Payer: Self-pay | Admitting: Physical Therapy

## 2016-09-03 DIAGNOSIS — R296 Repeated falls: Secondary | ICD-10-CM

## 2016-09-03 DIAGNOSIS — M25552 Pain in left hip: Secondary | ICD-10-CM | POA: Diagnosis not present

## 2016-09-03 DIAGNOSIS — R262 Difficulty in walking, not elsewhere classified: Secondary | ICD-10-CM

## 2016-09-03 NOTE — Therapy (Signed)
[place], [place], [address] Phone: [phone]   Fax:  [phone]  Physical Therapy Treatment  Patient Details  Name: Richard [name] MRN: [id number] Date of Birth: Jan 01, 1933 Referring Provider: Alvan Dame  Encounter Date: 09/03/2016      PT End of Session - 09/03/16 0837    Visit Number 19   Date for PT Re-Evaluation 10/02/16   PT Start Time 0750   PT Stop Time 0840   PT Time Calculation (min) 50 min   Activity Tolerance Patient tolerated treatment well   Behavior During Therapy [facility] for tasks assessed/performed;Impulsive      Past Medical History:  Diagnosis Date  . Abnormal finding on GI tract imaging   . Anemia    pt's daughter denies  . Arrhythmia    atrial fibrillation  . Arthritis   . Atrial fibrillation ([place])   . 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 Cataract    bil removed  . Cholelithiasis   . Cholelithiasis   . Dehydration   . Dyslipidemia   . Dysphagia following unspecified cerebrovascular disease   . Esophageal stricture   . Esophageal stricture   . Essential hypertension, benign   . Facial basal cell cancer    nose  . GERD (gastroesophageal reflux disease)   . GERD (gastroesophageal reflux disease)   . Hiatal hernia   . History of BPH   . History of radiation therapy 01/29/11 -03/15/11   larynx 6600 cGy 33 sessions  . Hordeolum externum (stye)   . Hyperbilirubinemia   . Hypertension   . Hypokalemia   . Hyponatremia   . Left displaced femoral neck fracture ([place])   . Multiple myeloma   . Murmur   . Pancytopenia ([place])   . Pneumonia   . Prostate cancer ([place]) 01/23/11   Gleason 3+3=6  . Pulmonary hypertension ([place])     Past Surgical History:  Procedure Laterality Date  . APPENDECTOMY    . BACK SURGERY    . COLONOSCOPY    . ESOPHAGEAL DILATION    . HIP ARTHROPLASTY Left 06/03/2016   Procedure: ARTHROPLASTY BIPOLAR HIP LEFT  (HEMIARTHROPLASTY);   Surgeon: Paralee Cancel, MD;  [facility];  Service: Orthopedics;  Laterality: Left;  . HIP SURGERY    . right chest wall resection     of second ,third,and fourth rib  . TONSILLECTOMY    . trigeminal neuralgia    . UPPER GASTROINTESTINAL ENDOSCOPY      There were no vitals filed for this visit.      Subjective Assessment - 09/03/16 0753    Subjective Had soreness in the left groin after last visit, does report soreness when getting off of the toilet.  Daughter reports that he had a good weekend, however he started the steroids yesterday and did not sleep last night and will resume the cancer medication later this week.   Currently in Pain? Yes   Pain Score 2    Pain Location Groin   Pain Orientation Left   Aggravating Factors  getting off of the toilet                         [facility] Adult PT Treatment/Exercise - 09/03/16 0001      Ambulation/Gait   Gait Comments Patient describes a compulsion to pick things up off the floor (crumbs, dirt, thread), he reports feeling unsteady, we  worked on him walking and picking objects up today     High Level Balance   High Level Balance Activities Side stepping;Backward walking;Tandem walking;Marching forwards;Direction changes   High Level Balance Comments resisted gait fwd and sidestepping     Knee/Hip Exercises: Aerobic   Nustep level 6 x 6 minutes     Knee/Hip Exercises: Machines for Strengthening   Cybex Knee Extension 15# 3x10   Cybex Knee Flexion 35# 3x10   Cybex Leg Press 50# 2x10, then left only 20# 2x10 with some assistance   Other Machine Standing pulling 20# to chest and tehn 15# obliques to work on his standing balance                   PT Short Term Goals - 07/09/16 0922      PT SHORT TERM GOAL #2   Title independent with intial HEP   Status Achieved           PT Long Term Goals - 09/03/16 0839      PT LONG TERM GOAL #1   Title decrease pain 50%   Status Achieved     PT LONG TERM  GOAL #2   Title decrease TUG time to 25 seconds   Status Achieved     PT LONG TERM GOAL #3   Title walk with SPC 300 feet   Status Achieved     PT LONG TERM GOAL #4   Title go up and down stairs step over step   Status Partially Met     PT LONG TERM GOAL #5   Title increase left hip strength to 4+/5   Status Achieved               Plan - 09/03/16 0837    Clinical Impression Statement Patient gets dizzy when he bends over and picks something up off of the floor, it is mostly when he returns to upright and he tends to go backward, I asked him to go slow when returning to upright and this alleviated most of his symptoms, his left hip is now stronger than the right, his biggest issue now is balance, this could be affected by the medications as he is doing better right now without the cancer medicine   PT Next Visit Plan will work on his independent gym program for D/C   Consulted and Agree with Plan of Care Patient      Patient will benefit from skilled therapeutic intervention in order to improve the following deficits and impairments:  Abnormal gait, Decreased activity tolerance, Cardiopulmonary status limiting activity, Decreased balance, Decreased mobility, Decreased endurance, Decreased range of motion, Decreased strength, Difficulty walking, Impaired flexibility, Pain  Visit Diagnosis: Pain in left hip - Plan: PT plan of care cert/re-cert  Repeated falls - Plan: PT plan of care cert/re-cert  Difficulty in walking, not elsewhere classified - Plan: PT plan of care cert/re-cert     Problem List Patient Active Problem List   Diagnosis Date Noted  . BPH (benign prostatic hypertrophy) 06/15/2016  . Restless leg syndrome 06/15/2016  . Edema 06/13/2016  . Altered mental status   . Delirium   . Hyponatremia 06/03/2016  . Left displaced femoral neck fracture ([place]) 06/02/2016  . Hip fracture ([place]) 06/02/2016  . Hordeolum externum (stye) 05/28/2016  . Hypokalemia  05/28/2016  . Dehydration 05/28/2016  . Pancytopenia ([place]) 05/28/2016  . Hyperbilirubinemia 05/28/2016  . 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 Multiple myeloma ([place]) 06/23/2012  .  Vocal cord cancer ([place]) 12/24/2011  . Prostate cancer ([place]) 12/24/2011  . Multiple myeloma ([place]) 12/23/2011  . Essential hypertension, benign 10/10/2010  . ESOPHAGEAL STRICTURE 08/28/2010  . CHOLELITHIASIS 08/28/2010  . DYSPHAGIA UNSPECIFIED 08/28/2010  . ABNORMAL FINDINGS GI TRACT 08/28/2010  . GERD 08/23/2010  . CHOLELITHIASIS 08/23/2010  . HYPERTENSION, PULMONARY 08/10/2010  . ATRIAL FIBRILLATION 08/10/2010  . MURMUR 08/10/2010    [name]., PT 09/03/2016, 8:42 AM  [facility] [place], [place], [address] Phone: [phone]   Fax:  [phone]  Name: Richard [name] MRN: [id number] Date of Birth: [birth year]

## 2016-09-05 ENCOUNTER — Encounter: Payer: Self-pay | Admitting: Physical Therapy

## 2016-09-05 ENCOUNTER — Ambulatory Visit: Payer: Medicare Other | Admitting: Physical Therapy

## 2016-09-05 DIAGNOSIS — M25552 Pain in left hip: Secondary | ICD-10-CM

## 2016-09-05 DIAGNOSIS — R296 Repeated falls: Secondary | ICD-10-CM

## 2016-09-05 DIAGNOSIS — R262 Difficulty in walking, not elsewhere classified: Secondary | ICD-10-CM

## 2016-09-05 NOTE — Therapy (Signed)
[address] [place], [place], [address] Phone: [phone]   Fax:  [phone]  Physical Therapy Treatment  Patient Details  Name: Richard [name] MRN: [id number] Date of Birth: [birth year] Referring Provider: Alvan Dame  Encounter Date: 09/05/2016      PT End of Session - 09/05/16 0844    Visit Number 20   Date for PT Re-Evaluation 10/02/16   PT Start Time 0755   PT Stop Time 0843   PT Time Calculation (min) 48 min   Activity Tolerance Patient tolerated treatment well   Behavior During Therapy [facility] for tasks assessed/performed;Impulsive      Past Medical History:  Diagnosis Date  . Abnormal finding on GI tract imaging   . Anemia    pt's daughter denies  . Arrhythmia    atrial fibrillation  . Arthritis   . Atrial fibrillation ([place])   . 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 Cataract    bil removed  . Cholelithiasis   . Cholelithiasis   . Dehydration   . Dyslipidemia   . Dysphagia following unspecified cerebrovascular disease   . Esophageal stricture   . Esophageal stricture   . Essential hypertension, benign   . Facial basal cell cancer    nose  . GERD (gastroesophageal reflux disease)   . GERD (gastroesophageal reflux disease)   . Hiatal hernia   . History of BPH   . History of radiation therapy 01/29/11 -03/15/11   larynx 6600 cGy 33 sessions  . Hordeolum externum (stye)   . Hyperbilirubinemia   . Hypertension   . Hypokalemia   . Hyponatremia   . Left displaced femoral neck fracture ([place])   . Multiple myeloma   . Murmur   . Pancytopenia ([place])   . Pneumonia   . Prostate cancer ([place]) 01/23/11   Gleason 3+3=6  . Pulmonary hypertension ([place])     Past Surgical History:  Procedure Laterality Date  . APPENDECTOMY    . BACK SURGERY    . COLONOSCOPY    . ESOPHAGEAL DILATION    . HIP ARTHROPLASTY Left 06/03/2016   Procedure: ARTHROPLASTY BIPOLAR HIP LEFT  (HEMIARTHROPLASTY);   Surgeon: Paralee Cancel, MD;  Location: [facility];  Service: Orthopedics;  Laterality: Left;  . HIP SURGERY    . right chest wall resection     of second ,third,and fourth rib  . TONSILLECTOMY    . trigeminal neuralgia    . UPPER GASTROINTESTINAL ENDOSCOPY      There were no vitals filed for this visit.      Subjective Assessment - 09/05/16 0803    Subjective Patient reports feeling pretty good, started the cancer medication yesterday.   Currently in Pain? No/denies            [facility] PT Assessment - 09/05/16 0001      Strength   Overall Strength Comments 4+/5 in the left hip, 4/5 in the right     Timed Up and Go Test   Normal TUG (seconds) 18                     [facility] Adult PT Treatment/Exercise - 09/05/16 0001      Ambulation/Gait   Gait Comments Gait around the building no device, very good until we started going up hill and then started shuffling feet.     High Level Balance   High Level Balance Activities Side stepping;Backward walking;Tandem  walking;Marching forwards;Direction changes   High Level Balance Comments picking up objects     Knee/Hip Exercises: Aerobic   Nustep level 6 x 6 minutes     Knee/Hip Exercises: Machines for Strengthening   Cybex Knee Extension 15# 3x10   Cybex Knee Flexion 35# 3x10   Cybex Leg Press 50# 2x10, then left only 20# 2x10 with some assistance   Other Machine Standing pulling 20# to chest and tehn 15# obliques to work on his standing balance      Knee/Hip Exercises: Standing   Hip Flexion 3 sets;10 reps   Hip Flexion Limitations 5#   Hip Abduction 2 sets;15 reps;Both   Abduction Limitations 5#                  PT Short Term Goals - 07/09/16 9562      PT SHORT TERM GOAL #2   Title independent with intial HEP   Status Achieved           PT Long Term Goals - 09/03/16 0839      PT LONG TERM GOAL #1   Title decrease pain 50%   Status Achieved     PT LONG TERM GOAL #2   Title decrease TUG time  to 25 seconds   Status Achieved     PT LONG TERM GOAL #3   Title walk with SPC 300 feet   Status Achieved     PT LONG TERM GOAL #4   Title go up and down stairs step over step   Status Partially Met     PT LONG TERM GOAL #5   Title increase left hip strength to 4+/5   Status Achieved               Plan - 09/17/16 0846    Clinical Impression Statement PAtient did the best he has done with me, less shuffling.  Was able to pick items up from the floor without difficulty today.   PT Next Visit Plan will work on his independent gym program for D/C   Consulted and Agree with Plan of Care Patient      Patient will benefit from skilled therapeutic intervention in order to improve the following deficits and impairments:  Abnormal gait, Decreased activity tolerance, Cardiopulmonary status limiting activity, Decreased balance, Decreased mobility, Decreased endurance, Decreased range of motion, Decreased strength, Difficulty walking, Impaired flexibility, Pain  Visit Diagnosis: Pain in left hip  Repeated falls  Difficulty in walking, not elsewhere classified       G-Codes - 09/17/2016 0848    Functional Assessment Tool Used foto 46% limitation   Functional Limitation Mobility: Walking and moving around   Mobility: Walking and Moving Around Current Status ([id number]) At least 40 percent but less than 60 percent impaired, limited or restricted   Mobility: Walking and Moving Around Goal Status [phone]) At least 40 percent but less than 60 percent impaired, limited or restricted      Problem List Patient Active Problem List   Diagnosis Date Noted  . BPH (benign prostatic hypertrophy) 06/15/2016  . Restless leg syndrome 06/15/2016  . Edema 06/13/2016  . Altered mental status   . Delirium   . Hyponatremia 06/03/2016  . Left displaced femoral neck fracture ([place]) 06/02/2016  . Hip fracture ([place]) 06/02/2016  . Hordeolum externum (stye) 05/28/2016  . Hypokalemia 05/28/2016  .  Dehydration 05/28/2016  . Pancytopenia ([place]) 05/28/2016  . Hyperbilirubinemia 05/28/2016  . Cancer of true vocal cord ([place])   .  Multiple myeloma ([place]) 06/23/2012  . 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 Prostate cancer ([place]) 12/24/2011  . Multiple myeloma ([place]) 12/23/2011  . Essential hypertension, benign 10/10/2010  . ESOPHAGEAL STRICTURE 08/28/2010  . CHOLELITHIASIS 08/28/2010  . DYSPHAGIA UNSPECIFIED 08/28/2010  . ABNORMAL FINDINGS GI TRACT 08/28/2010  . GERD 08/23/2010  . CHOLELITHIASIS 08/23/2010  . HYPERTENSION, PULMONARY 08/10/2010  . ATRIAL FIBRILLATION 08/10/2010  . MURMUR 08/10/2010    [name]., PT 09/05/2016, 8:52 AM  [facility] [place], [place], [address] Phone: [phone]   Fax:  [phone]  Name: PRANEETH BUSSEY MRN: [id number] Date of Birth: 1933-08-19

## 2016-09-10 ENCOUNTER — Ambulatory Visit: Payer: Medicare Other | Attending: Orthopedic Surgery | Admitting: Physical Therapy

## 2016-09-10 ENCOUNTER — Telehealth: Payer: Self-pay | Admitting: *Deleted

## 2016-09-10 ENCOUNTER — Encounter: Payer: Self-pay | Admitting: Physical Therapy

## 2016-09-10 DIAGNOSIS — R296 Repeated falls: Secondary | ICD-10-CM | POA: Diagnosis present

## 2016-09-10 DIAGNOSIS — M25552 Pain in left hip: Secondary | ICD-10-CM | POA: Diagnosis present

## 2016-09-10 DIAGNOSIS — R262 Difficulty in walking, not elsewhere classified: Secondary | ICD-10-CM | POA: Insufficient documentation

## 2016-09-10 NOTE — Telephone Encounter (Signed)
Message received from patient's daughter to inform Dr. Benay Spice that Mr. Trombly was not able to start Pomalyst until 09/03/16 or 09/04/16 and that he was off of medicine for 2-3 weeks d/t financial aide clearance.  Dr. Benay Spice notified and call placed back to patient's daughter and message left for her confirming that office received her message.

## 2016-09-10 NOTE — Therapy (Signed)
[address] [place], [place], [address] Phone: [phone]   Fax:  [phone]  Physical Therapy Treatment  Patient Details  Name: Richard [name] MRN: [id number] Date of Birth: 17-Aug-1933 Referring Provider: Alvan Dame  Encounter Date: 09/10/2016      PT End of Session - 09/10/16 0845    Visit Number 21   Date for PT Re-Evaluation 10/02/16   PT Start Time 0753   PT Stop Time 0845   PT Time Calculation (min) 52 min   Activity Tolerance Patient tolerated treatment well   Behavior During Therapy [facility] for tasks assessed/performed;Impulsive      Past Medical History:  Diagnosis Date  . Abnormal finding on GI tract imaging   . Anemia    pt's daughter denies  . Arrhythmia    atrial fibrillation  . Arthritis   . Atrial fibrillation ([place])   . 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 Cataract    bil removed  . Cholelithiasis   . Cholelithiasis   . Dehydration   . Dyslipidemia   . Dysphagia following unspecified cerebrovascular disease   . Esophageal stricture   . Esophageal stricture   . Essential hypertension, benign   . Facial basal cell cancer    nose  . GERD (gastroesophageal reflux disease)   . GERD (gastroesophageal reflux disease)   . Hiatal hernia   . History of BPH   . History of radiation therapy 01/29/11 -03/15/11   larynx 6600 cGy 33 sessions  . Hordeolum externum (stye)   . Hyperbilirubinemia   . Hypertension   . Hypokalemia   . Hyponatremia   . Left displaced femoral neck fracture ([place])   . Multiple myeloma   . Murmur   . Pancytopenia ([place])   . Pneumonia   . Prostate cancer ([place]) 01/23/11   Gleason 3+3=6  . Pulmonary hypertension     Past Surgical History:  Procedure Laterality Date  . APPENDECTOMY    . BACK SURGERY    . COLONOSCOPY    . ESOPHAGEAL DILATION    . HIP ARTHROPLASTY Left 06/03/2016   Procedure: ARTHROPLASTY BIPOLAR HIP LEFT  (HEMIARTHROPLASTY);   Surgeon: Paralee Cancel, MD;  [facility];  Service: Orthopedics;  Laterality: Left;  . HIP SURGERY    . right chest wall resection     of second ,third,and fourth rib  . TONSILLECTOMY    . trigeminal neuralgia    . UPPER GASTROINTESTINAL ENDOSCOPY      There were no vitals filed for this visit.      Subjective Assessment - 09/10/16 0759    Subjective Patient has been back on the cancer medication for about a week now.  He is back to doing some of the extensor pattern and shuffling upon observation.  Denies falls.   Currently in Pain? No/denies                         [facility] Adult PT Treatment/Exercise - 09/10/16 0001      Ambulation/Gait   Gait Comments gait down and up stairs step over step and then outside with education on safety on uneven surfaces     High Level Balance   High Level Balance Activities Side stepping;Backward walking;Tandem walking;Marching forwards;Direction changes   High Level Balance Comments picking up objects, toe walk, heel walk     Knee/Hip Exercises: Machines for Strengthening   Cybex Knee  Extension 15# 3x10   Cybex Knee Flexion 35# 3x10   Cybex Leg Press 50# 2x10, then left only 20# 2x10 with some assistance   Other Machine Standing pulling 20# to chest and tehn 15# obliques to work on his standing balance      Knee/Hip Exercises: Standing   Hip Abduction 2 sets;15 reps;Both   Abduction Limitations 5#                  PT Short Term Goals - 07/09/16 3094      PT SHORT TERM GOAL #2   Title independent with intial HEP   Status Achieved           PT Long Term Goals - 09/03/16 0839      PT LONG TERM GOAL #1   Title decrease pain 50%   Status Achieved     PT LONG TERM GOAL #2   Title decrease TUG time to 25 seconds   Status Achieved     PT LONG TERM GOAL #3   Title walk with SPC 300 feet   Status Achieved     PT LONG TERM GOAL #4   Title go up and down stairs step over step   Status Partially Met      PT LONG TERM GOAL #5   Title increase left hip strength to 4+/5   Status Achieved               Plan - 09/10/16 0846    Clinical Impression Statement Patient is now back on the cancer medication, he is now exhibiting the extensor pattern, leaning back more and shuffling the feet.  I spoke with him and his daughter some today about the gym program.   PT Next Visit Plan Will D/C next visit as the hip is strong and the balance truly seems to be medication related.   Consulted and Agree with Plan of Care Patient      Patient will benefit from skilled therapeutic intervention in order to improve the following deficits and impairments:  Abnormal gait, Decreased activity tolerance, Cardiopulmonary status limiting activity, Decreased balance, Decreased mobility, Decreased endurance, Decreased range of motion, Decreased strength, Difficulty walking, Impaired flexibility, Pain  Visit Diagnosis: Pain in left hip  Repeated falls  Difficulty in walking, not elsewhere classified     Problem List Patient Active Problem List   Diagnosis Date Noted  . BPH (benign prostatic hypertrophy) 06/15/2016  . Restless leg syndrome 06/15/2016  . Edema 06/13/2016  . Altered mental status   . Delirium   . Hyponatremia 06/03/2016  . Left displaced femoral neck fracture ([place]) 06/02/2016  . Hip fracture ([place]) 06/02/2016  . Hordeolum externum (stye) 05/28/2016  . Hypokalemia 05/28/2016  . Dehydration 05/28/2016  . Pancytopenia ([place]) 05/28/2016  . Hyperbilirubinemia 05/28/2016  . Cancer of true vocal cord ([place])   . Multiple myeloma ([place]) 06/23/2012  . Vocal cord cancer ([place]) 12/24/2011  . Prostate cancer ([place]) 12/24/2011  . Multiple myeloma ([place]) 12/23/2011  . Essential hypertension, benign 10/10/2010  . ESOPHAGEAL STRICTURE 08/28/2010  . CHOLELITHIASIS 08/28/2010  . DYSPHAGIA UNSPECIFIED 08/28/2010  . ABNORMAL FINDINGS GI TRACT 08/28/2010  . GERD 08/23/2010  . CHOLELITHIASIS 08/23/2010   . HYPERTENSION, PULMONARY 08/10/2010  . ATRIAL FIBRILLATION 08/10/2010  . MURMUR 08/10/2010    [name]., PT 09/10/2016, 8:49 AM  [place] [address] [facility] [place], [place], [address] Phone: [phone]   Fax:  [phone]  Name:  LEEUM SANKEY MRN: [id number] Date of Birth: 11-02-1933

## 2016-09-12 ENCOUNTER — Ambulatory Visit (HOSPITAL_BASED_OUTPATIENT_CLINIC_OR_DEPARTMENT_OTHER): Payer: Medicare Other | Admitting: Oncology

## 2016-09-12 ENCOUNTER — Other Ambulatory Visit (HOSPITAL_BASED_OUTPATIENT_CLINIC_OR_DEPARTMENT_OTHER): Payer: Medicare Other

## 2016-09-12 ENCOUNTER — Encounter: Payer: Self-pay | Admitting: Physical Therapy

## 2016-09-12 ENCOUNTER — Ambulatory Visit: Payer: Medicare Other | Admitting: Physical Therapy

## 2016-09-12 ENCOUNTER — Telehealth: Payer: Self-pay | Admitting: Oncology

## 2016-09-12 VITALS — BP 134/91 | HR 79 | Temp 97.8°F | Resp 17 | Ht 68.0 in | Wt 177.8 lb

## 2016-09-12 DIAGNOSIS — C9000 Multiple myeloma not having achieved remission: Secondary | ICD-10-CM

## 2016-09-12 DIAGNOSIS — I10 Essential (primary) hypertension: Secondary | ICD-10-CM

## 2016-09-12 DIAGNOSIS — R262 Difficulty in walking, not elsewhere classified: Secondary | ICD-10-CM

## 2016-09-12 DIAGNOSIS — R296 Repeated falls: Secondary | ICD-10-CM

## 2016-09-12 DIAGNOSIS — M25552 Pain in left hip: Secondary | ICD-10-CM | POA: Diagnosis not present

## 2016-09-12 DIAGNOSIS — C9002 Multiple myeloma in relapse: Secondary | ICD-10-CM

## 2016-09-12 LAB — COMPREHENSIVE METABOLIC PANEL
ALBUMIN: 3.1 g/dL — AB (ref 3.5–5.0)
ALK PHOS: 74 U/L (ref 40–150)
ALT: 27 U/L (ref 0–55)
AST: 17 U/L (ref 5–34)
Anion Gap: 8 mEq/L (ref 3–11)
BUN: 23.6 mg/dL (ref 7.0–26.0)
CO2: 27 mEq/L (ref 22–29)
CREATININE: 0.8 mg/dL (ref 0.7–1.3)
Calcium: 9.3 mg/dL (ref 8.4–10.4)
Chloride: 98 mEq/L (ref 98–109)
EGFR: 83 mL/min/{1.73_m2} — ABNORMAL LOW (ref >90)
GLUCOSE: 94 mg/dL (ref 70–140)
POTASSIUM: 3.5 meq/L (ref 3.5–5.1)
SODIUM: 133 meq/L — AB (ref 136–145)
Total Bilirubin: 0.85 mg/dL (ref 0.20–1.20)
Total Protein: 5.6 g/dL — ABNORMAL LOW (ref 6.4–8.3)

## 2016-09-12 LAB — CBC WITH DIFFERENTIAL/PLATELET
BASO%: 0.1 % (ref 0.0–2.0)
BASOS ABS: 0 10*3/uL (ref 0.0–0.1)
EOS%: 5 % (ref 0.0–7.0)
Eosinophils Absolute: 0.3 10*3/uL (ref 0.0–0.5)
HEMATOCRIT: 34 % — AB (ref 38.4–49.9)
HEMOGLOBIN: 11.3 g/dL — AB (ref 13.0–17.1)
LYMPH#: 0.8 10*3/uL — AB (ref 0.9–3.3)
LYMPH%: 13.1 % — ABNORMAL LOW (ref 14.0–49.0)
MCH: 30.2 pg (ref 27.2–33.4)
MCHC: 33.3 g/dL (ref 32.0–36.0)
MCV: 90.7 fL (ref 79.3–98.0)
MONO#: 0.7 10*3/uL (ref 0.1–0.9)
MONO%: 12.1 % (ref 0.0–14.0)
NEUT%: 69.7 % (ref 39.0–75.0)
NEUTROS ABS: 4.3 10*3/uL (ref 1.5–6.5)
Platelets: 111 10*3/uL — ABNORMAL LOW (ref 140–400)
RBC: 3.75 10*6/uL — ABNORMAL LOW (ref 4.20–5.82)
RDW: 17.7 % — AB (ref 11.0–14.6)
WBC: 6.1 10*3/uL (ref 4.0–10.3)

## 2016-09-12 NOTE — Progress Notes (Signed)
[place] OFFICE PROGRESS NOTE   Diagnosis: Multiple myeloma  INTERVAL HISTORY:   Mr. Pettis returns as scheduled. The most recent cycle of pomalidomide was delayed until 09/04/2016 secondary to insurance issues. He reports feeling well. Good appetite. He is gaining weight. He has completed physical therapy and plans to begin another exercise program. No pain.  Objective:  Vital signs in last 24 hours:  Blood pressure (!) 134/91, pulse 79, temperature 97.8 F (36.6 C), temperature source Oral, resp. rate 17, height 5' 8"  (1.727 m), weight 177 lb 12.8 oz (80.6 kg), SpO2 95 %.    HEENT: No thrush or ulcers Resp: Lungs clear bilaterally Cardio: Regular rate and rhythm GI: No hepatomegaly, nontender Vascular: No leg edema   Lab Results:  Lab Results  Component Value Date   WBC 6.1 09/12/2016   HGB 11.3 (L) 09/12/2016   HCT 34.0 (L) 09/12/2016   MCV 90.7 09/12/2016   PLT 111 (L) 09/12/2016   NEUTROABS 4.3 09/12/2016   08/19/2016-lambda free light chains 33  Medications: I have reviewed the patient's current medications.  Assessment/Plan: 1. Multiple myeloma: The serum free light chains were elevated 08/29/2009. A CT of the chest 08/31/2009 showed a new pleural mass consistent with progression of multiple myeloma. He completed 12 cycles of Revlimid/Decadron. The serum free light chains were improved to 4.34 on 12/29/2009. A restaging CT of the chest 02/19/2010 showed resolution of right axillary lymphadenopathy and a pleural-based mass. The serum free lambda light chains were again elevated.  Salvage Revlimid/Decadron initiated 12/08/2014  Cycle 2 01/05/2015  Serum light chains improved 01/24/2015  Cycle 3 02/01/2015  Cycle 4 03/02/2015  Cycle 5 03/30/2015  Cycle 6 04/27/2015  Cycle 7 05/25/2015  Cycle 8 06/22/2015  Cycle 9 07/20/2015  Cycle 10 08/18/2015  Changed to maintenance Revlimid beginning 09/23/2015  Progressive anemia and  Elevated serum free light chains 11/13/2015  Changed to weekly Cytoxan/Velcade/Decadron beginning 11/21/2015, changed to 3/4 week schedule beginning 01/02/2016  Serum light chains improved on 03/26/2016  Serum light chains increased 04/23/2016  Serum light chains increased 05/21/2016  Cycle 1 pomalidomide/decadron 05/30/2016  Cycle 2 pomalidomide/Decadron 06/27/2016  Serum light chains improved 07/03/2016  Cycle 3 Pomalidomide/Decadron 07/25/2016  Cycle 4 Pomalidomide/Decadron 09/04/2016 2.History of back pain, likely related to the pleural-based mass at the right chest, resolved. 2. Right axillary/subpectoral lymphadenopathy on a CT of the chest 08/31/2009, likely related to multiple myeloma, resolved. 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 He is maintained on every three-month Zometa. 4. Pancytopenia secondary to Revlimid and multiple myeloma. He has persistent mild thrombocytopenia. 5. Hypertension. 6. Status post hip replacement. 7. History of back surgery. 8. History of trigeminal neuralgia. 9. Status post removal of a lipoma from the right axilla in February 2010. 10. 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 11. Esophageal reflux disease, followed by Dr. Janace Hoard.  12. Esophageal stricture, status post an evaluation by Dr. Henrene Pastor. 13. Right vocal cord lesion, status post a biopsy by Dr. 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 He completed radiation under the direction of Dr. Valere Dross on 03/15/2011. 14. Admission with pneumonia 03/26/2010. 15. History of Atrial flutter/fibrillation while hospitalized August 2011. He is followed by Dr. Percival Spanish. 16. Elevated prostate specific antigen, status post a biopsy 01/23/2011. He was found to have a Gleason 6 cancer in 10% of the cores. He is followed with an observation approach. He is now followed by Dr. Diona Fanti. 17. History of Mild hypercalcemia  18. Fall  with  a left wrist fracture June 2015  19. Right chest wall pain secondary to a right second rib plasmacytoma confirmed on a CT 11/28/2014, resolved 20. History of Hyponatremia 21.  history of Thrombocytopenia secondary progression of multiple myeloma and chemotherapy 22. Fall with left femoral neck fracture 06/02/2016, left hemiarthroplasty 06/03/2016 23. Hospital delirium June 2017    Disposition:  Mr. Hohn appears stable. The most recent cycle of pomalidomide was delayed secondary to insurance issues. He will complete the current cycle of therapy and return for an office and lab visit on 09/26/2016.  Betsy Coder, MD  09/12/2016  9:53 AM

## 2016-09-12 NOTE — Therapy (Signed)
[address] [place], [place], [address] Phone: [phone]   Fax:  [phone]  Physical Therapy Treatment  Patient Details  Name: Richard [name] MRN: [id number] Date of Birth: 1933-09-11 Referring Provider: Alvan Dame  Encounter Date: 09/12/2016      PT End of Session - 09/12/16 1352    Visit Number 22   PT Start Time 2671   PT Stop Time 1350   PT Time Calculation (min) 44 min   Activity Tolerance Patient tolerated treatment well   Behavior During Therapy [facility] for tasks assessed/performed;Impulsive      Past Medical History:  Diagnosis Date  . Abnormal finding on GI tract imaging   . Anemia    pt's daughter denies  . Arrhythmia    atrial fibrillation  . Arthritis   . Atrial fibrillation ([place])   . 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 Cataract    bil removed  . Cholelithiasis   . Cholelithiasis   . Dehydration   . Dyslipidemia   . Dysphagia following unspecified cerebrovascular disease   . Esophageal stricture   . Esophageal stricture   . Essential hypertension, benign   . Facial basal cell cancer    nose  . GERD (gastroesophageal reflux disease)   . GERD (gastroesophageal reflux disease)   . Hiatal hernia   . History of BPH   . History of radiation therapy 01/29/11 -03/15/11   larynx 6600 cGy 33 sessions  . Hordeolum externum (stye)   . Hyperbilirubinemia   . Hypertension   . Hypokalemia   . Hyponatremia   . Left displaced femoral neck fracture ([place])   . Multiple myeloma   . Murmur   . Pancytopenia ([place])   . Pneumonia   . Prostate cancer ([place]) 01/23/11   Gleason 3+3=6  . Pulmonary hypertension     Past Surgical History:  Procedure Laterality Date  . APPENDECTOMY    . BACK SURGERY    . COLONOSCOPY    . ESOPHAGEAL DILATION    . HIP ARTHROPLASTY Left 06/03/2016   Procedure: ARTHROPLASTY BIPOLAR HIP LEFT  (HEMIARTHROPLASTY);  Surgeon: Paralee Cancel, MD;  [facility]: [facility];  Service: Orthopedics;  Laterality: Left;  . HIP SURGERY    . right chest wall resection     of second ,third,and fourth rib  . TONSILLECTOMY    . trigeminal neuralgia    . UPPER GASTROINTESTINAL ENDOSCOPY      There were no vitals filed for this visit.      Subjective Assessment - 09/12/16 1333    Subjective PAtient and daughter had some quesitons about how to proceed with HEP and gym activities   Currently in Pain? No/denies                         [facility] Adult PT Treatment/Exercise - 09/12/16 0001      Ambulation/Gait   Gait Comments gait outside up/down slopes, curbs CGA     High Level Balance   High Level Balance Activities Side stepping;Backward walking;Tandem walking;Marching forwards;Direction changes     Self-Care   Self-Care Other Self-Care Comments   Other Self-Care Comments  D/C plans for gym and HEP, went over with him and  his daughter answered their questions and went over safety with walking inside and outside the home  PT Short Term Goals - 07/09/16 4665      PT SHORT TERM GOAL #2   Title independent with intial HEP   Status Achieved           PT Long Term Goals - 09/12/16 1354      PT LONG TERM GOAL #1   Title decrease pain 50%   Status Achieved     PT LONG TERM GOAL #2   Title decrease TUG time to 25 seconds   Status Achieved     PT LONG TERM GOAL #3   Title walk with SPC 300 feet   Status Achieved     PT LONG TERM GOAL #4   Title go up and down stairs step over step   Status Achieved     PT LONG TERM GOAL #5   Title increase left hip strength to 4+/5   Status Achieved               Plan - 09/12/16 1352    Clinical Impression Statement Went over all HEP, gym, balance and safety for the patient and his daughter.  The patients left hip is great and is now stronger than the right hip.  The medication does seem to affect his balance, but it seems to be the cancer medication that he  needs.   PT Next Visit Plan D/C   Consulted and Agree with Plan of Care Patient;Family member/caregiver   Family Member Consulted Daughter Rip Harbour      Patient will benefit from skilled therapeutic intervention in order to improve the following deficits and impairments:  Abnormal gait, Decreased activity tolerance, Cardiopulmonary status limiting activity, Decreased balance, Decreased mobility, Decreased endurance, Decreased range of motion, Decreased strength, Difficulty walking, Impaired flexibility, Pain  Visit Diagnosis: Pain in left hip  Repeated falls  Difficulty in walking, not elsewhere classified     Problem List Patient Active Problem List   Diagnosis Date Noted  . BPH (benign prostatic hypertrophy) 06/15/2016  . Restless leg syndrome 06/15/2016  . Edema 06/13/2016  . Altered mental status   . Delirium   . Hyponatremia 06/03/2016  . Left displaced femoral neck fracture ([place]) 06/02/2016  . Hip fracture ([place]) 06/02/2016  . Hordeolum externum (stye) 05/28/2016  . Hypokalemia 05/28/2016  . Dehydration 05/28/2016  . Pancytopenia ([employer]) 05/28/2016  . Hyperbilirubinemia 05/28/2016  . 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 Multiple myeloma ([place]) 06/23/2012  . 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 Prostate cancer ([place]) 12/24/2011  . Multiple myeloma ([place]) 12/23/2011  . Essential hypertension, benign 10/10/2010  . ESOPHAGEAL STRICTURE 08/28/2010  . CHOLELITHIASIS 08/28/2010  . DYSPHAGIA UNSPECIFIED 08/28/2010  . ABNORMAL FINDINGS GI TRACT 08/28/2010  . GERD 08/23/2010  . CHOLELITHIASIS 08/23/2010  . HYPERTENSION, PULMONARY 08/10/2010  . ATRIAL FIBRILLATION 08/10/2010  . MURMUR 08/10/2010   PHYSICAL THERAPY DISCHARGE SUMMARY  V  Plan: Patient agrees to discharge.  Patient goals were met. Patient is being discharged due to meeting the stated rehab goals.  ?????      Richard [name] 09/12/2016, 2:02 PM  [place], [place], [address] Phone: [phone]   Fax:  [phone]  Name: Richard [name] MRN: [id number] Date of Birth: Aug 27, 1933

## 2016-09-12 NOTE — Telephone Encounter (Signed)
Gave patient dtr avs report and appointments for October  °

## 2016-09-13 ENCOUNTER — Telehealth: Payer: Self-pay | Admitting: *Deleted

## 2016-09-13 LAB — KAPPA/LAMBDA LIGHT CHAINS
IG LAMBDA FREE LIGHT CHAIN: 26.1 mg/L (ref 5.7–26.3)
Ig Kappa Free Light Chain: 10.9 mg/L (ref 3.3–19.4)
KAPPA/LAMBDA FLC RATIO: 0.42 (ref 0.26–1.65)

## 2016-09-13 NOTE — Telephone Encounter (Signed)
-----   Message from Ladell Pier, MD sent at 09/13/2016  4:15 PM EDT ----- Please call daughter light chains are normal

## 2016-09-13 NOTE — Telephone Encounter (Signed)
Per Dr. Benay Spice, patient's daughter notified that light chains are normal.  Results reviewed with [facility].  Rip Harbour appreciative of call and has no questions at this time.

## 2016-09-16 ENCOUNTER — Ambulatory Visit: Payer: Medicare Other | Admitting: Physical Therapy

## 2016-09-18 ENCOUNTER — Ambulatory Visit: Payer: Medicare Other | Admitting: Physical Therapy

## 2016-09-25 ENCOUNTER — Ambulatory Visit: Payer: Medicare Other | Admitting: Physical Therapy

## 2016-09-25 ENCOUNTER — Other Ambulatory Visit (HOSPITAL_BASED_OUTPATIENT_CLINIC_OR_DEPARTMENT_OTHER): Payer: Medicare Other

## 2016-09-25 DIAGNOSIS — C9000 Multiple myeloma not having achieved remission: Secondary | ICD-10-CM

## 2016-09-25 DIAGNOSIS — C9002 Multiple myeloma in relapse: Secondary | ICD-10-CM

## 2016-09-25 LAB — COMPREHENSIVE METABOLIC PANEL
ALT: 26 U/L (ref 0–55)
AST: 16 U/L (ref 5–34)
Albumin: 3.1 g/dL — ABNORMAL LOW (ref 3.5–5.0)
Alkaline Phosphatase: 84 U/L (ref 40–150)
Anion Gap: 7 mEq/L (ref 3–11)
BUN: 28.3 mg/dL — AB (ref 7.0–26.0)
CALCIUM: 9.6 mg/dL (ref 8.4–10.4)
CHLORIDE: 101 meq/L (ref 98–109)
CO2: 25 meq/L (ref 22–29)
CREATININE: 1 mg/dL (ref 0.7–1.3)
EGFR: 71 mL/min/{1.73_m2} — ABNORMAL LOW (ref >90)
Glucose: 99 mg/dl (ref 70–140)
Potassium: 3.8 mEq/L (ref 3.5–5.1)
Sodium: 134 mEq/L — ABNORMAL LOW (ref 136–145)
Total Bilirubin: 0.39 mg/dL (ref 0.20–1.20)
Total Protein: 6 g/dL — ABNORMAL LOW (ref 6.4–8.3)

## 2016-09-25 LAB — CBC WITH DIFFERENTIAL/PLATELET
BASO%: 0.2 % (ref 0.0–2.0)
Basophils Absolute: 0 10*3/uL (ref 0.0–0.1)
EOS%: 2.2 % (ref 0.0–7.0)
Eosinophils Absolute: 0.1 10*3/uL (ref 0.0–0.5)
HEMATOCRIT: 33.1 % — AB (ref 38.4–49.9)
HGB: 10.9 g/dL — ABNORMAL LOW (ref 13.0–17.1)
LYMPH#: 0.8 10*3/uL — AB (ref 0.9–3.3)
LYMPH%: 19.8 % (ref 14.0–49.0)
MCH: 29.9 pg (ref 27.2–33.4)
MCHC: 33 g/dL (ref 32.0–36.0)
MCV: 90.5 fL (ref 79.3–98.0)
MONO#: 0.9 10*3/uL (ref 0.1–0.9)
MONO%: 22.8 % — ABNORMAL HIGH (ref 0.0–14.0)
NEUT#: 2.1 10*3/uL (ref 1.5–6.5)
NEUT%: 55 % (ref 39.0–75.0)
Platelets: 145 10*3/uL (ref 140–400)
RBC: 3.66 10*6/uL — AB (ref 4.20–5.82)
RDW: 17 % — ABNORMAL HIGH (ref 11.0–14.6)
WBC: 3.9 10*3/uL — ABNORMAL LOW (ref 4.0–10.3)

## 2016-09-26 ENCOUNTER — Other Ambulatory Visit: Payer: Self-pay | Admitting: *Deleted

## 2016-09-26 ENCOUNTER — Ambulatory Visit (HOSPITAL_BASED_OUTPATIENT_CLINIC_OR_DEPARTMENT_OTHER): Payer: Medicare Other | Admitting: Oncology

## 2016-09-26 ENCOUNTER — Telehealth: Payer: Self-pay | Admitting: Oncology

## 2016-09-26 VITALS — BP 152/49 | HR 59 | Temp 98.5°F | Resp 18 | Ht 68.0 in | Wt 181.3 lb

## 2016-09-26 DIAGNOSIS — D801 Nonfamilial hypogammaglobulinemia: Secondary | ICD-10-CM | POA: Diagnosis not present

## 2016-09-26 DIAGNOSIS — I10 Essential (primary) hypertension: Secondary | ICD-10-CM

## 2016-09-26 DIAGNOSIS — C9002 Multiple myeloma in relapse: Secondary | ICD-10-CM | POA: Diagnosis not present

## 2016-09-26 LAB — KAPPA/LAMBDA LIGHT CHAINS
Ig Kappa Free Light Chain: 12.7 mg/L (ref 3.3–19.4)
Ig Lambda Free Light Chain: 39.1 mg/L — ABNORMAL HIGH (ref 5.7–26.3)
KAPPA/LAMBDA FLC RATIO: 0.32 (ref 0.26–1.65)

## 2016-09-26 MED ORDER — POMALIDOMIDE 3 MG PO CAPS: 3.0000 mg | ORAL_CAPSULE | Freq: Every day | ORAL | 0 refills | Status: DC

## 2016-09-26 NOTE — Telephone Encounter (Signed)
AVS report and appointment schedule given to patient, per 09/26/16 los. ° °

## 2016-09-26 NOTE — Progress Notes (Signed)
[place] OFFICE PROGRESS NOTE   Diagnosis: Multiple myeloma  INTERVAL HISTORY:   [name] returns as scheduled. No complaint. He received Zometa when he was here on 08/19/2016.  Objective:  Vital signs in last 24 hours:  Blood pressure (!) 152/49, pulse (!) 59, temperature 98.5 F (36.9 C), temperature source Oral, resp. rate 18, height _0  (1.727 m), weight 181 lb 4.8 oz (82.2 kg), SpO2 99 %.    HEENT: No thrush ecchymosis/ulcer at the right side of the tongue Resp: Lungs clear bilaterally Cardio: Regular rate and rhythm GI: No hepatosplenomegaly Vascular: No leg edema   Lab Results:  Lab Results  Component Value Date   WBC 3.9 (L) 09/25/2016   HGB 10.9 (L) 09/25/2016   HCT 33.1 (L) 09/25/2016   MCV 90.5 09/25/2016   PLT 145 09/25/2016   NEUTROABS 2.1 09/25/2016   Lambda free light chains on 09/12/2016: 26.1  Medications: I have reviewed the patient's current medications.  Assessment/Plan: 1. Multiple myeloma: The serum free light chains were elevated 08/29/2009. A CT of the chest 08/31/2009 showed a new pleural mass consistent with progression of multiple myeloma. He completed 12 cycles of Revlimid/Decadron. The serum free light chains were improved to 4.34 on 12/29/2009. A restaging CT of the chest 02/19/2010 showed resolution of right axillary lymphadenopathy and a pleural-based mass. The serum free lambda light chains were again elevated.  Salvage Revlimid/Decadron initiated 12/08/2014  Cycle 2 01/05/2015  Serum light chains improved 01/24/2015  Cycle 3 02/01/2015  Cycle 4 03/02/2015  Cycle 5 03/30/2015  Cycle 6 04/27/2015  Cycle 7 05/25/2015  Cycle 8 06/22/2015  Cycle 9 07/20/2015  Cycle 10 08/18/2015  Changed to maintenance Revlimid beginning 09/23/2015  Progressive anemia and Elevated serum free light chains 11/13/2015  Changed to weekly Cytoxan/Velcade/Decadron beginning 11/21/2015, changed to 3/4 week schedule  beginning 01/02/2016  Serum light chains improved on 03/26/2016  Serum light chains increased 04/23/2016  Serum light chains increased 05/21/2016  Cycle 1 pomalidomide/decadron 05/30/2016  Cycle 2 pomalidomide/Decadron 06/27/2016  Serum light chains improved 07/03/2016  Cycle 3 Pomalidomide/Decadron 07/25/2016  Cycle 4 Pomalidomide/Decadron 09/04/2016  Cycle 5 Pomalidomide/Decadron 10/02/2016  2.History of back pain, likely related to the pleural-based mass at the right chest, resolved. 2. Right axillary/subpectoral lymphadenopathy on a CT of the chest 08/31/2009, likely related to multiple myeloma, resolved. 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 He is maintained on every three-month Zometa. 4. Pancytopenia secondary to Revlimid and multiple myeloma. He has persistent mild thrombocytopenia. 5. Hypertension. 6. Status post hip replacement. 7. History of back surgery. 8. History of trigeminal neuralgia. 9. Status post removal of a lipoma from the right axilla in February 2010. 10. 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 11. Esophageal reflux disease, followed by Dr. Janace Hoard.  12. Esophageal stricture, status post an evaluation by Dr. Henrene Pastor. 13. Right vocal cord lesion, status post a biopsy by Dr. 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 He completed radiation under the direction of Dr. Valere Dross on 03/15/2011. 14. Admission with pneumonia 03/26/2010. 15. History of Atrial flutter/fibrillation while hospitalized August 2011. He is followed by Dr. Percival Spanish. 16. Elevated prostate specific antigen, status post a biopsy 01/23/2011. He was found to have a Gleason 6 cancer in 10% of the cores. He is followed with an observation approach. He is now followed by Dr. Diona Fanti. 17. History of Mild hypercalcemia  18. Fall with a left wrist fracture June 2015  19. Right chest wall pain  secondary to a right second rib  plasmacytoma confirmed on a CT 11/28/2014, resolved 20. History of Hyponatremia 21. history of Thrombocytopenia secondary progression of multiple myeloma and chemotherapy 22. Fall with left femoral neck fracture 06/02/2016, left hemiarthroplasty 06/03/2016 23. Hospital delirium June 2017     Disposition:  Richard [name] appears stable. The serum light chains are stable. He will begin another cycle of  Pomalidomide on 10/02/2016. He will return for an office and lab visit on 10/28/2016.  Betsy Coder, MD  09/26/2016  8:17 AM

## 2016-09-27 ENCOUNTER — Encounter: Payer: Self-pay | Admitting: *Deleted

## 2016-09-27 ENCOUNTER — Ambulatory Visit: Payer: Medicare Other | Admitting: Physical Therapy

## 2016-09-27 ENCOUNTER — Telehealth: Payer: Self-pay | Admitting: *Deleted

## 2016-09-27 ENCOUNTER — Other Ambulatory Visit: Payer: Self-pay | Admitting: *Deleted

## 2016-09-27 NOTE — Telephone Encounter (Signed)
Call from pt's daughter reporting [employer] has pulled their grant and shut down.  Daughter was contacted by [employer] to get in touch with [employer] patient support. We should receive a fax from [place] this afternoon. [employer] Pt support will be closed 10/23 and 10/24. Pt is due to start next cycle on 10/25. Message forwarded to collaborative RN.

## 2016-09-27 NOTE — Progress Notes (Signed)
[employer] patient support enrollment form faxed to [phone]

## 2016-10-02 ENCOUNTER — Telehealth: Payer: Self-pay | Admitting: *Deleted

## 2016-10-03 ENCOUNTER — Other Ambulatory Visit: Payer: Self-pay | Admitting: *Deleted

## 2016-10-04 MED ORDER — POMALIDOMIDE 3 MG PO CAPS: 3.0000 mg | ORAL_CAPSULE | Freq: Every day | ORAL | 0 refills | Status: DC

## 2016-10-04 NOTE — Telephone Encounter (Signed)
Pt has been approved for assistance through [place]. Pomalyst prescription will need to be faxed to [facility] Phone: [phone] Fax: [phone]

## 2016-10-10 NOTE — Telephone Encounter (Signed)
Message from pt's daughter reporting pt began Pomalyst on 10/09/16. Should he come in for lab/office as scheduled on 10/28/16?

## 2016-10-11 NOTE — Telephone Encounter (Signed)
Returned call to pt's daughter: We can reschedule to 11/24 or keep appointments 11/20. She requests to keep 11/20 as scheduled.

## 2016-10-14 ENCOUNTER — Other Ambulatory Visit: Payer: Self-pay | Admitting: Oncology

## 2016-10-14 DIAGNOSIS — C9000 Multiple myeloma not having achieved remission: Secondary | ICD-10-CM

## 2016-10-21 ENCOUNTER — Telehealth: Payer: Self-pay | Admitting: *Deleted

## 2016-10-21 NOTE — Telephone Encounter (Signed)
Received fax from [facility] call service: Pt has pain and splotchy rash to R hip and thigh.  Called pt's daughter to follow up: She took pt to urgent care over the weekend, they thought he had a reaction to Amoxil ( tooth extraction 10/10/16). Rash worsened since then. They stopped Amoxil, took pt to PCP today and was told it was shingles.  PCP started pt on Acyclovir 1,000 mg daily for 7 days then he will decrease to one daily.  Discussed likelihood he will need to continue Acyclovir while on Pomalyst. Instructed Melinda to call office if rash isn't scabbed over by 11/20, will need to move visit out further. She voiced understanding. Dr. Benay Spice made aware of above.

## 2016-10-28 ENCOUNTER — Ambulatory Visit (HOSPITAL_BASED_OUTPATIENT_CLINIC_OR_DEPARTMENT_OTHER): Payer: Medicare Other | Admitting: Oncology

## 2016-10-28 ENCOUNTER — Telehealth: Payer: Self-pay | Admitting: Oncology

## 2016-10-28 ENCOUNTER — Telehealth: Payer: Self-pay | Admitting: *Deleted

## 2016-10-28 ENCOUNTER — Other Ambulatory Visit (HOSPITAL_BASED_OUTPATIENT_CLINIC_OR_DEPARTMENT_OTHER): Payer: Medicare Other

## 2016-10-28 ENCOUNTER — Other Ambulatory Visit: Payer: Self-pay | Admitting: *Deleted

## 2016-10-28 VITALS — BP 137/61 | HR 91 | Temp 98.6°F | Resp 17 | Ht 68.0 in | Wt 168.2 lb

## 2016-10-28 DIAGNOSIS — C9002 Multiple myeloma in relapse: Secondary | ICD-10-CM

## 2016-10-28 DIAGNOSIS — I10 Essential (primary) hypertension: Secondary | ICD-10-CM

## 2016-10-28 DIAGNOSIS — L259 Unspecified contact dermatitis, unspecified cause: Secondary | ICD-10-CM | POA: Diagnosis not present

## 2016-10-28 LAB — COMPREHENSIVE METABOLIC PANEL
ALK PHOS: 69 U/L (ref 40–150)
ALT: 12 U/L (ref 0–55)
AST: 10 U/L (ref 5–34)
Albumin: 2.8 g/dL — ABNORMAL LOW (ref 3.5–5.0)
Anion Gap: 9 mEq/L (ref 3–11)
BUN: 23.2 mg/dL (ref 7.0–26.0)
CALCIUM: 10.3 mg/dL (ref 8.4–10.4)
CHLORIDE: 95 meq/L — AB (ref 98–109)
CO2: 24 mEq/L (ref 22–29)
Creatinine: 1 mg/dL (ref 0.7–1.3)
EGFR: 69 mL/min/{1.73_m2} — AB (ref >90)
GLUCOSE: 130 mg/dL (ref 70–140)
POTASSIUM: 3.6 meq/L (ref 3.5–5.1)
SODIUM: 129 meq/L — AB (ref 136–145)
Total Bilirubin: 0.86 mg/dL (ref 0.20–1.20)
Total Protein: 6.5 g/dL (ref 6.4–8.3)

## 2016-10-28 LAB — CBC WITH DIFFERENTIAL/PLATELET
BASO%: 0.4 % (ref 0.0–2.0)
BASOS ABS: 0 10*3/uL (ref 0.0–0.1)
EOS ABS: 0.2 10*3/uL (ref 0.0–0.5)
EOS%: 3.8 % (ref 0.0–7.0)
HCT: 30.4 % — ABNORMAL LOW (ref 38.4–49.9)
HGB: 10.3 g/dL — ABNORMAL LOW (ref 13.0–17.1)
LYMPH%: 25.5 % (ref 14.0–49.0)
MCH: 30.2 pg (ref 27.2–33.4)
MCHC: 33.9 g/dL (ref 32.0–36.0)
MCV: 89 fL (ref 79.3–98.0)
MONO#: 0.9 10*3/uL (ref 0.1–0.9)
MONO%: 21.3 % — AB (ref 0.0–14.0)
NEUT#: 2 10*3/uL (ref 1.5–6.5)
NEUT%: 49 % (ref 39.0–75.0)
Platelets: 190 10*3/uL (ref 140–400)
RBC: 3.41 10*6/uL — ABNORMAL LOW (ref 4.20–5.82)
RDW: 17 % — ABNORMAL HIGH (ref 11.0–14.6)
WBC: 4 10*3/uL (ref 4.0–10.3)
lymph#: 1 10*3/uL (ref 0.9–3.3)

## 2016-10-28 MED ORDER — POMALIDOMIDE 3 MG PO CAPS: 3.0000 mg | ORAL_CAPSULE | Freq: Every day | ORAL | 0 refills | Status: DC

## 2016-10-28 MED ORDER — HYDROCODONE-ACETAMINOPHEN 5-325 MG PO TABS: ORAL_TABLET | ORAL | 0 refills | Status: DC

## 2016-10-28 NOTE — Progress Notes (Signed)
[place] OFFICE PROGRESS NOTE   Diagnosis: Multiple myeloma  INTERVAL HISTORY:   Richard [name] returns with his daughter. He began the current cycle of pomalidomide on 10/09/2016. He developed a "rash "at the left thigh beginning 10/18/2016. He was evaluated by his primary physician and diagnosed with a zoster rash. He is completing a course of Valtrex. The rash is improving. The rash is painful.  Objective:  Vital signs in last 24 hours:  Blood pressure 137/61, pulse 91, temperature 98.6 F (37 C), temperature source Oral, resp. rate 17, height 5' 8"  (1.727 m), weight 168 lb 3.2 oz (76.3 kg), SpO2 100 %.    HEENT: No thrush Resp: Scattered rhonchi, no respiratory distress Cardio: Irregular GI: No hepatosplenomegaly Vascular: No leg edema  Skin: Resolving vesicular rash at the left anterolateral thigh     Lab Results:  Lab Results  Component Value Date   WBC 4.0 10/28/2016   HGB 10.3 (L) 10/28/2016   HCT 30.4 (L) 10/28/2016   MCV 89.0 10/28/2016   PLT 190 10/28/2016   NEUTROABS 2.0 10/28/2016     Medications: I have reviewed the patient's current medications.  Assessment/Plan: 1. Multiple myeloma: The serum free Lambda light chains were elevated 08/29/2009. A CT of the chest 08/31/2009 showed a new pleural mass consistent with progression of multiple myeloma. He completed 12 cycles of Revlimid/Decadron. The serum free light chains were improved to 4.34 on 12/29/2009. A restaging CT of the chest 02/19/2010 showed resolution of right axillary lymphadenopathy and a pleural-based mass. The serum free lambda light chains were again elevated.  Salvage Revlimid/Decadron initiated 12/08/2014  Cycle 2 01/05/2015  Serum light chains improved 01/24/2015  Cycle 3 02/01/2015  Cycle 4 03/02/2015  Cycle 5 03/30/2015  Cycle 6 04/27/2015  Cycle 7 05/25/2015  Cycle 8 06/22/2015  Cycle 9 07/20/2015  Cycle 10 08/18/2015  Changed to maintenance  Revlimid beginning 09/23/2015  Progressive anemia and Elevated serum free light chains 11/13/2015  Changed to weekly Cytoxan/Velcade/Decadron beginning 11/21/2015, changed to 3/4 week schedule beginning 01/02/2016  Serum light chains improved on 03/26/2016  Serum light chains increased 04/23/2016  Serum light chains increased 05/21/2016  Cycle 1 pomalidomide/decadron 05/30/2016  Cycle 2 pomalidomide/Decadron 06/27/2016  Serum light chains improved 07/03/2016  Cycle 3 Pomalidomide/Decadron 07/25/2016  Cycle 4 Pomalidomide/Decadron 09/04/2016  Cycle 5 Pomalidomide/Decadron 10/02/2016  2.History of back pain, likely related to the pleural-based mass at the right chest, resolved. 2. Right axillary/subpectoral lymphadenopathy on a CT of the chest 08/31/2009, likely related to multiple myeloma, resolved. 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 He is maintained on every three-month Zometa. 4. Pancytopenia secondary to Revlimid and multiple myeloma. He has persistent mild thrombocytopenia. 5. Hypertension. 6. Status post hip replacement. 7. History of back surgery. 8. History of trigeminal neuralgia. 9. Status post removal of a lipoma from the right axilla in February 2010. 10. 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 11. Esophageal reflux disease, followed by Dr. Janace Hoard.  12. Esophageal stricture, status post an evaluation by Dr. Henrene Pastor. 13. Right vocal cord lesion, status post a biopsy by Dr. 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 He completed radiation under the direction of Dr. Valere Dross on 03/15/2011. 14. Admission with pneumonia 03/26/2010. 15. History of Atrial flutter/fibrillation while hospitalized August 2011. He is followed by Dr. Percival Spanish. 16. Elevated prostate specific antigen, status post a biopsy 01/23/2011. He was found to have a Gleason 6 cancer in 10% of the cores. He is  followed with an  observation approach. He is now followed by Dr. Diona Fanti. 17. History of Mild hypercalcemia  18. Fall with a left wrist fracture June 2015  19. Right chest wall pain secondary to a right second rib plasmacytoma confirmed on a CT 11/28/2014, resolved 20. History of Hyponatremia 21. history of Thrombocytopenia secondary progression of multiple myeloma and chemotherapy 22. Fall with left femoral neck fracture 06/02/2016, left hemiarthroplasty 06/03/2016 23. Hospital delirium June 2017 24. Zoster rash, left thigh, November 2017   Disposition:  Richard [name] appears stable from a hematologic standpoint. He will continue Pomalidomide/Decadron. He will complete a therapeutic course of Valtrex and then switch to prophylactic Valtrex. We gave him a printer hydrocodone to use as needed for pain.  He will begin the next cycle of treatment on 11/06/2016. Richard [name] will return for an office and lab visit on 11/21/2016. He will receive Zometa on 11/21/2016.  Betsy Coder, MD  10/28/2016  10:11 AM

## 2016-10-28 NOTE — Telephone Encounter (Signed)
Per LOS I have scheduled appts and notified the scheduler 

## 2016-10-28 NOTE — Telephone Encounter (Signed)
Appointments scheduled per 10/28/16 los. A copy of the AVS report & appointment schedule was given to patient,per 10/28/16 los. °

## 2016-10-29 ENCOUNTER — Other Ambulatory Visit: Payer: Medicare Other

## 2016-10-29 ENCOUNTER — Other Ambulatory Visit: Payer: Self-pay | Admitting: *Deleted

## 2016-10-29 ENCOUNTER — Telehealth: Payer: Self-pay | Admitting: *Deleted

## 2016-10-29 ENCOUNTER — Ambulatory Visit: Payer: Medicare Other | Admitting: Oncology

## 2016-10-29 LAB — KAPPA/LAMBDA LIGHT CHAINS
Ig Kappa Free Light Chain: 25.8 mg/L — ABNORMAL HIGH (ref 3.3–19.4)
Ig Lambda Free Light Chain: 166.3 mg/L — ABNORMAL HIGH (ref 5.7–26.3)
Kappa/Lambda FluidC Ratio: 0.16 — ABNORMAL LOW (ref 0.26–1.65)

## 2016-10-29 MED ORDER — POMALIDOMIDE 3 MG PO CAPS: 3.0000 mg | ORAL_CAPSULE | Freq: Every day | ORAL | 0 refills | Status: DC

## 2016-10-29 NOTE — Telephone Encounter (Signed)
Message left for [facility] to inform her per Dr. Benay Spice that patient's light chains are higher, to continue on current treatment with Pomalyst and that if light chains remain elevated with next lab check Dr. Benay Spice will evaluate need for therapy change at that time. Instructed Melinda to call office back tomorrow with any questions.

## 2016-11-13 ENCOUNTER — Telehealth: Payer: Self-pay | Admitting: Pharmacist

## 2016-11-13 NOTE — Telephone Encounter (Signed)
Oral Chemotherapy Pharmacist Encounter  [employer] Patient Support application for re-enrollment for 2018 for Pomalyst completed and faxed on 11/12/16.  This encounter will continue to be updated until final determination.  Oral Oncology Clinic will continue to follow.  Johny Drilling, PharmD, BCPS, BCOP 11/13/2016  9:05 AM Oral Oncology Clinic [phone]

## 2016-11-21 ENCOUNTER — Other Ambulatory Visit: Payer: Self-pay

## 2016-11-21 ENCOUNTER — Ambulatory Visit (HOSPITAL_BASED_OUTPATIENT_CLINIC_OR_DEPARTMENT_OTHER): Payer: Medicare Other | Admitting: Oncology

## 2016-11-21 ENCOUNTER — Ambulatory Visit (HOSPITAL_BASED_OUTPATIENT_CLINIC_OR_DEPARTMENT_OTHER): Payer: Medicare Other

## 2016-11-21 ENCOUNTER — Encounter: Payer: Self-pay | Admitting: Cardiology

## 2016-11-21 ENCOUNTER — Telehealth: Payer: Self-pay | Admitting: *Deleted

## 2016-11-21 ENCOUNTER — Other Ambulatory Visit (HOSPITAL_BASED_OUTPATIENT_CLINIC_OR_DEPARTMENT_OTHER): Payer: Medicare Other

## 2016-11-21 VITALS — BP 128/56 | HR 89 | Temp 98.4°F | Resp 18 | Wt 168.1 lb

## 2016-11-21 DIAGNOSIS — C9002 Multiple myeloma in relapse: Secondary | ICD-10-CM | POA: Diagnosis not present

## 2016-11-21 DIAGNOSIS — I10 Essential (primary) hypertension: Secondary | ICD-10-CM

## 2016-11-21 DIAGNOSIS — I4891 Unspecified atrial fibrillation: Secondary | ICD-10-CM

## 2016-11-21 DIAGNOSIS — D649 Anemia, unspecified: Secondary | ICD-10-CM

## 2016-11-21 DIAGNOSIS — R413 Other amnesia: Secondary | ICD-10-CM

## 2016-11-21 DIAGNOSIS — C9000 Multiple myeloma not having achieved remission: Secondary | ICD-10-CM

## 2016-11-21 DIAGNOSIS — R630 Anorexia: Secondary | ICD-10-CM

## 2016-11-21 LAB — CBC WITH DIFFERENTIAL/PLATELET
BASO%: 0.1 % (ref 0.0–2.0)
BASOS ABS: 0 10*3/uL (ref 0.0–0.1)
EOS ABS: 0.2 10*3/uL (ref 0.0–0.5)
EOS%: 7 % (ref 0.0–7.0)
HCT: 27.1 % — ABNORMAL LOW (ref 38.4–49.9)
HGB: 9.1 g/dL — ABNORMAL LOW (ref 13.0–17.1)
LYMPH%: 19.6 % (ref 14.0–49.0)
MCH: 30.6 pg (ref 27.2–33.4)
MCHC: 33.7 g/dL (ref 32.0–36.0)
MCV: 90.7 fL (ref 79.3–98.0)
MONO#: 0.5 10*3/uL (ref 0.1–0.9)
MONO%: 15.1 % — AB (ref 0.0–14.0)
NEUT#: 1.9 10*3/uL (ref 1.5–6.5)
NEUT%: 58.2 % (ref 39.0–75.0)
PLATELETS: 189 10*3/uL (ref 140–400)
RBC: 2.98 10*6/uL — AB (ref 4.20–5.82)
RDW: 19.4 % — ABNORMAL HIGH (ref 11.0–14.6)
WBC: 3.3 10*3/uL — ABNORMAL LOW (ref 4.0–10.3)
lymph#: 0.7 10*3/uL — ABNORMAL LOW (ref 0.9–3.3)

## 2016-11-21 LAB — COMPREHENSIVE METABOLIC PANEL
ALK PHOS: 70 U/L (ref 40–150)
ALT: 57 U/L — ABNORMAL HIGH (ref 0–55)
ANION GAP: 10 meq/L (ref 3–11)
AST: 18 U/L (ref 5–34)
Albumin: 2.3 g/dL — ABNORMAL LOW (ref 3.5–5.0)
BILIRUBIN TOTAL: 0.77 mg/dL (ref 0.20–1.20)
BUN: 24.1 mg/dL (ref 7.0–26.0)
CO2: 26 meq/L (ref 22–29)
Calcium: 9.9 mg/dL (ref 8.4–10.4)
Chloride: 97 mEq/L — ABNORMAL LOW (ref 98–109)
Creatinine: 0.9 mg/dL (ref 0.7–1.3)
EGFR: 81 mL/min/{1.73_m2} — AB (ref >90)
GLUCOSE: 120 mg/dL (ref 70–140)
POTASSIUM: 3.4 meq/L — AB (ref 3.5–5.1)
SODIUM: 134 meq/L — AB (ref 136–145)
TOTAL PROTEIN: 5.9 g/dL — AB (ref 6.4–8.3)

## 2016-11-21 MED ORDER — SODIUM CHLORIDE 0.9 % IJ SOLN
10.0000 mL | INTRAMUSCULAR | Status: DC | PRN
  Filled 2016-11-21: qty 10

## 2016-11-21 MED ORDER — SODIUM CHLORIDE 0.9 % IV SOLN: Freq: Once | INTRAVENOUS | Status: DC

## 2016-11-21 MED ORDER — ZOLEDRONIC ACID 4 MG/100ML IV SOLN
4.0000 mg | Freq: Once | INTRAVENOUS | Status: AC
  Administered 2016-11-21: 4 mg via INTRAVENOUS
  Filled 2016-11-21: qty 100

## 2016-11-21 NOTE — Patient Instructions (Signed)

## 2016-11-21 NOTE — Progress Notes (Signed)
[facility] OFFICE PROGRESS NOTE   Diagnosis: Multiple myeloma  INTERVAL HISTORY:   Richard [name] returns with his daughter. He denies pain. He has no specific complaint. His daughter reports he has increased malaise, anorexia, and difficulty ambulating. They have noted weakness of his right leg. He began the most recent cycle of pomalidomide on 11/08/2016.Marland Kitchen He had an episode of "sweats "on 11/12/2016 and a fever on 11/13/2016. No associated symptoms. The fever spontaneously resolved.    Objective:  Vital signs in last 24 hours:  Blood pressure (!) 128/56, pulse 89, temperature 98.4 F (36.9 C), temperature source Oral, resp. rate 18, weight 168 lb 1.6 oz (76.2 kg), SpO2 96 %.    HEENT: No thrush Resp: Lungs clear bilaterally Cardio: Irregular GI: No hepatomegaly, nontender Vascular: No leg edema Neuro: Alert, oriented. Slow to answer questions-seems to have a problem with word finding. He can ambulate with assistance. The motor exam appears intact in the upper and lower extremities     Lab Results:  Lab Results  Component Value Date   WBC 3.3 (L) 11/21/2016   HGB 9.1 (L) 11/21/2016   HCT 27.1 (L) 11/21/2016   MCV 90.7 11/21/2016   PLT 189 11/21/2016   NEUTROABS 1.9 11/21/2016    Medications: I have reviewed the patient's current medications.  Assessment/Plan: 1. chains were elevated 08/29/2009. A CT of the chest 08/31/2009 showed a new pleural mass consistent with progression of multiple myeloma. He completed 12 cycles of Revlimid/Decadron. The serum free light chains were improved to 4.34 on 12/29/2009. A restaging CT of the chest 02/19/2010 showed resolution of right axillary lymphadenopathy and a pleural-based mass. The serum free lambda light chains were again elevated.  Salvage Revlimid/Decadron initiated 12/08/2014  Cycle 2 01/05/2015  Serum light chains improved 01/24/2015  Cycle 3 02/01/2015  Cycle 4 03/02/2015  Cycle 5  03/30/2015  Cycle 6 04/27/2015  Cycle 7 05/25/2015  Cycle 8 06/22/2015  Cycle 9 07/20/2015  Cycle 10 08/18/2015  Changed to maintenance Revlimid beginning 09/23/2015  Progressive anemia and Elevated serum free light chains 11/13/2015  Changed to weekly Cytoxan/Velcade/Decadron beginning 11/21/2015, changed to 3/4 week schedule beginning 01/02/2016  Serum light chains improved on 03/26/2016  Serum light chains increased 04/23/2016  Serum light chains increased 05/21/2016  Cycle 1 pomalidomide/decadron 05/30/2016  Cycle 2 pomalidomide/Decadron 06/27/2016  Serum light chains improved 07/03/2016  Cycle 3 Pomalidomide/Decadron 07/25/2016  Cycle 4 Pomalidomide/Decadron 09/04/2016  Cycle 5 Pomalidomide/Decadron 10/02/2016  Cycle 6 Pomalidomide/Decadron 11/08/2016 2.History of back pain, likely related to the pleural-based mass at the right chest, resolved. 2. Right axillary/subpectoral lymphadenopathy on a CT of the chest 08/31/2009, likely related to multiple myeloma, resolved. 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 He is maintained on every three-month Zometa. 4. Pancytopenia secondary to Revlimid and multiple myeloma. He has persistent mild thrombocytopenia. 5. Hypertension. 6. Status post hip replacement. 7. History of back surgery. 8. History of trigeminal neuralgia. 9. Status post removal of a lipoma from the right axilla in February 2010. 10. 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 11. Esophageal reflux disease, followed by Dr. Janace Hoard.  12. Esophageal stricture, status post an evaluation by Dr. Henrene Pastor. 13. Right vocal cord lesion, status post a biopsy by Dr. 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 He completed radiation under the direction of Dr. Valere Dross on 03/15/2011. 14. Admission with pneumonia 03/26/2010. 15. History of Atrial flutter/fibrillation while hospitalized August 2011. He is  followed by Dr. Percival Spanish. 16. Elevated  prostate specific antigen, status post a biopsy 01/23/2011. He was found to have a Gleason 6 cancer in 10% of the cores. He is followed with an observation approach. He is now followed by Dr. Diona Fanti. 17. History of Mild hypercalcemia  18. Fall with a left wrist fracture June 2015  19. Right chest wall pain secondary to a right second rib plasmacytoma confirmed on a CT 11/28/2014, resolved 20. History of Hyponatremia 21. history of Thrombocytopenia secondary progression of multiple myeloma and chemotherapy 22. Fall with left femoral neck fracture 06/02/2016, left hemiarthroplasty 06/03/2016 23. Hospital delirium June 2017 24. Zoster rash, left thigh, November 2017   Disposition:  Richard [name] has experienced a decline in his performance status over the past month. The serum free light chains were higher when he was here in November and he is now more anemic. I suspect the myeloma is progressing. The plan is to make a change in therapy if the light chains are higher today. He will discontinue Pomalidomide and treatment will changed to daratumumab/decadron.  I reviewed the potential toxicities associated with daratumumab with Richard [name] and his daughter. They were given reading materials on this agent. He agrees to proceed. He will be scheduled for a first treatment with daratumumab on 12/06/2016.  He has atrial fibrillation today. We will refer him to cardiology. He does not appear symptomatic from the atrium fibrillation.  Richard [name] has difficulty with word finding and memory recall today. His daughter reports she has noted middle status changes at home. He will see Dr. Ernie Hew. I suspect he has early dementia.  Richard [name] will be seen for an office visit 12/06/2016. He will receive Zometa today.  Approximate 40 minutes were spent with the patient today. The majority of the time was used for counseling and coordination of care. New treatment  orders were entered.  Betsy Coder, MD  11/21/2016  2:21 PM

## 2016-11-21 NOTE — Progress Notes (Signed)
START ON PATHWAY REGIMEN - Multiple Myeloma  [id number]: Daratumumab 16 mg/kg q28 Days Until Progression or Unacceptable Toxicity   A cycle is every 28 days:     Daratumumab (Darzalex(R)) 16 mg/kg Weekly x8 wks; q2 wks x16 wks; then q4 wks. Infuse IV, use low protein-binding filter, may escalate rate if tolerated. See linked supplementary infusion document for infusion details. Dose Mod: None Additional Orders: Cycle=q28 days. Administer methylprednisolone 20 mg (or equivalent) PO on 1st and 2nd days after each infusion to prevent delayed hypersensitivity rxn. Initiate antiviral ppx w/in 1 wk of starting & for 3 months following tx. Patients  with hx of COPD may need additional post infusion meds, see supplementary infusion document. Rec monitoring: infusion rxn, CBC w/diff, CMP, BP. May interfere w/ cross-matching & RBC antibody screening. See PI for more details. Refs: Darzalex  (daratumumab) PI; [place] 2015.  **Always confirm dose/schedule in your pharmacy ordering system**    Patient Characteristics: Relapsed / Refractory, All Lines of Therapy R-ISS Staging: Not Applicable Disease Classification: Refractory Line of Therapy: Fourth Line and Beyond  Intent of Therapy: Non-Curative / Palliative Intent, Discussed with Patient

## 2016-11-21 NOTE — Telephone Encounter (Signed)
EKG reviewed by Dr. Benay Spice: Refer to cardiology. Spoke with pt's daughter, he has seen Dr. Percival Spanish in the past. [facility]: Pt worked in for 11/22/16 @ 2:45. Left message on voicemail for daughter.

## 2016-11-21 NOTE — Progress Notes (Signed)
Cardiology Office Note   Date:  11/23/2016   ID:  Richard [name], DOB 01/08/1933, MRN [id number]  PCP:  Rachell Cipro, MD  Cardiologist:   Minus Breeding, MD  Referring:  Dr. Julieanne Manson   Chief Complaint  Patient presents with  . Atrial Fibrillation      History of Present Illness: Richard [name] is a 82 y.o. male who presents for follow up of atrial fib.  He had this in the past when she was acutely ill.   .   I have not seen him since 2014.   He did have an echo in June of this year.  This showed some mild AS but normal EF.   This was done when he was in the hospital with a broken hip. I reviewed some of these records but I don't see that there were any specific cardiac issues. The patient was being seen in the oncology office recently and yesterday was thought to have atrial fibrillation on EKG. 8. This actually demonstrated sinus rhythm with multifocal atrial tachycardia. His right bundle branch. He has had some leg weakness and it was wondered whether this could be related to her cardiac issue.  He doesn't feel any palpitations. He's had no presyncope or syncope. Denies any chest pressure discomfort.  He lives by himself though his daughter comes and checks on him.  It's very difficult to get an assessment of cardiac complaints as he seems to be somewhat confused about details. He apparently does get short of breath with activity by observation and he says he has to rest and he'll get better he seems to minimize this and is not describing other associated symptoms. He clearly has leg weakness and walks with a walker.  Past Medical History:  Diagnosis Date  . Anemia    pt's daughter denies  . Arthritis   . Atrial fibrillation ([place])   . 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 Cataract    bil removed  . Cholelithiasis   . Dyslipidemia   . Dysphagia following unspecified cerebrovascular disease   . Esophageal stricture   . Essential hypertension, benign   .  Facial basal cell cancer    nose  . GERD (gastroesophageal reflux disease)   . Hiatal hernia   . History of BPH   . History of radiation therapy 01/29/11 -03/15/11   larynx 6600 cGy 33 sessions  . Hyperbilirubinemia   . Hypertension   . Hypokalemia   . Hyponatremia   . Left displaced femoral neck fracture ([place])   . Multiple myeloma   . Pancytopenia ([place])   . Pneumonia   . Prostate cancer ([place]) 01/23/11   Gleason 3+3=6  . Pulmonary hypertension     Past Surgical History:  Procedure Laterality Date  . APPENDECTOMY    . BACK SURGERY    . COLONOSCOPY    . ESOPHAGEAL DILATION    . HIP ARTHROPLASTY Left 06/03/2016   Procedure: ARTHROPLASTY BIPOLAR HIP LEFT  (HEMIARTHROPLASTY);  Surgeon: Paralee Cancel, MD;  Location: [facility];  Service: Orthopedics;  Laterality: Left;  . HIP SURGERY    . right chest wall resection     of second ,third,and fourth rib  . TONSILLECTOMY    . trigeminal neuralgia    . UPPER GASTROINTESTINAL ENDOSCOPY       Current Outpatient Prescriptions  Medication Sig Dispense Refill  . acetaminophen (TYLENOL) 325 MG tablet Take 2 tablets (650 mg total) by mouth every  6 (six) hours as needed for mild pain, fever or headache.    . ACYCLOVIR PO Take 500 mg by mouth at bedtime.    Marland Kitchen aspirin EC 81 MG tablet Take 81 mg by mouth every morning.    . Cholecalciferol 5000 UNITS TABS Take 5,000 Units by mouth every morning.     . Cyanocobalamin (B-12) 2500 MCG SUBL Take 2,500 mcg by mouth every morning.     Marland Kitchen dexamethasone (DECADRON) 4 MG tablet TAKE 10 TABS BY MOUTH ONCE A WEEK 40 tablet 3  . docusate sodium (COLACE) 100 MG capsule Take 100 mg by mouth 2 (two) times daily.    . feeding supplement, ENSURE ENLIVE, (ENSURE ENLIVE) LIQD Take 237 mLs by mouth 2 (two) times daily between meals.    . ferrous sulfate 325 (65 FE) MG tablet Take 650 mg by mouth every other day.    Marland Kitchen HYDROcodone-acetaminophen (NORCO/VICODIN) 5-325 MG tablet .5-1 tablet every 6 hours as needed 30 tablet 0    . losartan-hydrochlorothiazide (HYZAAR) 50-12.5 MG tablet Take 0.5 tablets by mouth daily.     . Multiple Vitamin (MULTIVITAMIN WITH MINERALS) TABS tablet Take 1 tablet by mouth every morning.    Marland Kitchen omeprazole (PRILOSEC) 40 MG capsule Take 40 mg by mouth daily.  0  . pomalidomide (POMALYST) 3 MG capsule Take 3 mg by mouth daily. Take with water for 21 days. Stop for 7 days then repeat.    Marland Kitchen rOPINIRole (REQUIP) 0.25 MG tablet Take 1-2 tablet by mouth once daily at bedtime As needed for restless legs    . Saw Palmetto 160 MG CAPS Take by mouth.    . tamsulosin (FLOMAX) 0.4 MG CAPS capsule Take 0.4 mg by mouth daily with breakfast.     No current facility-administered medications for this visit.     Allergies:   Celebrex [celecoxib] and Coricidin d cold-flu-sinus  [chlorphen-pe-acetaminophen]    Social History:  The patient  reports that he quit smoking about 22 years ago. His smoking use included Cigarettes. He has quit using smokeless tobacco. He reports that he does not drink alcohol or use drugs.   Family History:  The patient's family history includes Heart attack in his father; Mental illness in his sister; Multiple myeloma in his mother.    ROS:  Please see the history of present illness.   Otherwise, review of systems are positive for none.   All other systems are reviewed and negative.    PHYSICAL EXAM: VS:  BP 130/61 (BP Location: Right Arm)   Pulse (!) 106   Ht _0  (1.727 m)   Wt 166 lb 9.6 oz (75.6 kg)   BMI 25.33 kg/m  , BMI Body mass index is 25.33 kg/m. GENERAL:  Frail appearing, dentures HEENT:  Pupils equal round and reactive, fundi not visualized, oral mucosa unremarkable NECK:  No jugular venous distention, waveform within normal limits, carotid upstroke brisk and symmetric, no bruits, no thyromegaly LYMPHATICS:  No cervical, inguinal adenopathy LUNGS:  Clear to auscultation bilaterally BACK:  No CVA tenderness CHEST:  Unremarkable HEART:  PMI not displaced or  sustained,S1 and S2 within normal limits, no S3, no S4, no clicks, no rubs, 2 out of 6 apical systolic murmur early peaking and nonradiating, no diastolic murmurs ABD:  Flat, positive bowel sounds normal in frequency in pitch, no bruits, no rebound, no guarding, no midline pulsatile mass, no hepatomegaly, no splenomegaly EXT:  2 plus pulses throughout, no edema, no cyanosis no clubbing SKIN:  No rashes no nodules NEURO:  Cranial nerves II through XII grossly intact, motor grossly intact throughout PSYCH:  Cognitively intact, oriented to person place and time    EKG:  EKG is ordered today. The ekg ordered today demonstrates sinus rhythm, rate 106, frequent and consecutive premature atrial contractions, right bundle branch block, left anterior fascicular block   Recent Labs: 05/27/2016: Magnesium 1.9 06/04/2016: TSH 0.512 11/21/2016: ALT 57; BUN 24.1; Creatinine 0.9; HGB 9.1; Platelets 189; Potassium 3.4; Sodium 134    Lipid Panel    Component Value Date/Time   CHOL  07/27/2010 0132    82        ATP III CLASSIFICATION:  <200     mg/dL   Desirable  200-239  mg/dL   Borderline High  >=240    mg/dL   High          TRIG 75 07/27/2010 0132   HDL 31 (L) 07/27/2010 0132   CHOLHDL 2.6 07/27/2010 0132   VLDL 15 07/27/2010 0132   LDLCALC  07/27/2010 0132    36        Total Cholesterol/HDL:CHD Risk Coronary Heart Disease Risk Table                     Men   Women  1/2 Average Risk   3.4   3.3  Average Risk       5.0   4.4  2 X Average Risk   9.6   7.1  3 X Average Risk  23.4   11.0        Use the calculated Patient Ratio above and the CHD Risk Table to determine the patient's CHD Risk.        ATP III CLASSIFICATION (LDL):  <100     mg/dL   Optimal  100-129  mg/dL   Near or Above                    Optimal  130-159  mg/dL   Borderline  160-189  mg/dL   High  >190     mg/dL   Very High      Wt Readings from Last 3 Encounters:  11/22/16 166 lb 9.6 oz (75.6 kg)  11/21/16 168  lb 1.6 oz (76.2 kg)  10/28/16 168 lb 3.2 oz (76.3 kg)      Other studies Reviewed: Additional studies/ records that were reviewed today include: Office records. Review of the above records demonstrates:  Please see elsewhere in the note.     ASSESSMENT AND PLAN:  ATRIAL FIB:   The patient has had atrial fibrillation in the past but this was when he was acutely ill. I don't see that there was recent documentation of this. He was not in atrial fibrillation yesterday on the EKG despite the computer interpretation. This is not to say that he might not have this rhythm. However, I doubt that the dysrhythmia he is currently demonstrating is causing his balance issues, leg weakness, dizziness and decreased exercise tolerance with dyspnea on exertion. He doesn't seem to be in heart failure as he's had an echo recently he's not describing the symptoms. I can't exclude atrial fibrillation, sustained symptomatic tachycardia or bradycardia arrhythmias without documentation and so I'll obtain a Holter monitor. Certainly if we see atrial fibrillation be a very difficult decision needs to be made concerning the use of anticoagulation as he is certainly high for fall risk  ABNORMAL ECHO:  He has mild aortic stenosis.  He'll think this is causing any symptoms. No change in therapy is indicated.   Current medicines are reviewed at length with the patient today.  The patient does not have concerns regarding medicines.  The following changes have been made:  no change  Labs/ tests ordered today include: Holter  Orders Placed This Encounter  Procedures  . Holter monitor - 48 hour  . EKG 12-Lead     Disposition:   FU with after the Holter.      Signed, Minus Breeding, MD  11/23/2016 4:17 PM     [facility]

## 2016-11-22 ENCOUNTER — Other Ambulatory Visit: Payer: Self-pay | Admitting: *Deleted

## 2016-11-22 ENCOUNTER — Ambulatory Visit (INDEPENDENT_AMBULATORY_CARE_PROVIDER_SITE_OTHER): Payer: Medicare Other | Admitting: Cardiology

## 2016-11-22 ENCOUNTER — Encounter: Payer: Self-pay | Admitting: Cardiology

## 2016-11-22 VITALS — BP 130/61 | HR 106 | Ht 68.0 in | Wt 166.6 lb

## 2016-11-22 DIAGNOSIS — I350 Nonrheumatic aortic (valve) stenosis: Secondary | ICD-10-CM | POA: Diagnosis not present

## 2016-11-22 DIAGNOSIS — I498 Other specified cardiac arrhythmias: Secondary | ICD-10-CM

## 2016-11-22 DIAGNOSIS — R531 Weakness: Secondary | ICD-10-CM

## 2016-11-22 DIAGNOSIS — R5383 Other fatigue: Secondary | ICD-10-CM

## 2016-11-22 DIAGNOSIS — C9000 Multiple myeloma not having achieved remission: Secondary | ICD-10-CM

## 2016-11-22 LAB — KAPPA/LAMBDA LIGHT CHAINS
IG KAPPA FREE LIGHT CHAIN: 20.8 mg/L — AB (ref 3.3–19.4)
Ig Lambda Free Light Chain: 404 mg/L — ABNORMAL HIGH (ref 5.7–26.3)
Kappa/Lambda FluidC Ratio: 0.05 — ABNORMAL LOW (ref 0.26–1.65)

## 2016-11-22 NOTE — Patient Instructions (Signed)
Medication Instructions:  Continue current medications  Labwork: None Ordered  Testing/Procedures: Your physician has recommended that you wear a holter monitor 48 hours. Holter monitors are medical devices that record the heart's electrical activity. Doctors most often use these monitors to diagnose arrhythmias. Arrhythmias are problems with the speed or rhythm of the heartbeat. The monitor is a small, portable device. You can wear one while you do your normal daily activities. This is usually used to diagnose what is causing palpitations/syncope (passing out).    Follow-Up: Your physician recommends that you schedule a follow-up appointment in: 2 weeks.   Any Other Special Instructions Will Be Listed Below (If Applicable).     If you need a refill on your cardiac medications before your next appointment, please call your pharmacy.

## 2016-11-23 ENCOUNTER — Encounter: Payer: Self-pay | Admitting: Cardiology

## 2016-11-23 ENCOUNTER — Other Ambulatory Visit: Payer: Self-pay | Admitting: Oncology

## 2016-11-25 ENCOUNTER — Telehealth: Payer: Self-pay | Admitting: *Deleted

## 2016-11-25 NOTE — Telephone Encounter (Signed)
Call placed to patient's daughter to inform her per Dr. Benay Spice that light chains are higher, to stop pomalidomide and to plan on proceeding with daratumumab as scheduled.  Rip Harbour is appreciative of call and has no questions at this time.  Teach back done.

## 2016-11-25 NOTE — Telephone Encounter (Signed)
-----   Message from Ladell Pier, MD sent at 11/23/2016  8:11 AM EST ----- Please call daughter, light chains are higher, stop pomalidomide Plan to proceed with daratumumab as scheduled

## 2016-11-27 ENCOUNTER — Telehealth: Payer: Self-pay | Admitting: *Deleted

## 2016-11-27 NOTE — Telephone Encounter (Signed)
Call from pt's daughter looking for direction on whether to reschedule pt for treatment this Friday. She initially requested 12/29 because she needs to go to out of town. Informed her Dr. Benay Spice will not be back in the office until 12/26 but to call office if pt needs anything before then. It is difficult for her to see that he is weaker. Offered support and acknowledged her role as caregiver is a difficult one. Reminded her that Dr. Benay Spice would have told her if he thought it was unsafe to wait an additional week for treatment.

## 2016-12-03 ENCOUNTER — Ambulatory Visit (INDEPENDENT_AMBULATORY_CARE_PROVIDER_SITE_OTHER): Payer: Medicare Other

## 2016-12-03 ENCOUNTER — Other Ambulatory Visit: Payer: Self-pay | Admitting: Cardiology

## 2016-12-03 DIAGNOSIS — I471 Supraventricular tachycardia: Secondary | ICD-10-CM

## 2016-12-03 DIAGNOSIS — I498 Other specified cardiac arrhythmias: Secondary | ICD-10-CM

## 2016-12-05 ENCOUNTER — Telehealth: Payer: Self-pay | Admitting: Oncology

## 2016-12-05 ENCOUNTER — Encounter: Payer: Self-pay | Admitting: Pharmacist

## 2016-12-05 ENCOUNTER — Telehealth: Payer: Self-pay | Admitting: Pharmacy Technician

## 2016-12-05 NOTE — Telephone Encounter (Signed)
Spoke with Richard [name] daughter, who let me know that he will no longer need Pomalyst and will be infused with another medication tomorrow.  She thanked me for my concern about possibly needing a refill here at the end of the year.

## 2016-12-05 NOTE — Telephone Encounter (Signed)
Spoke with patient dtr re appointments for 12/29 @ 8 am - dtr will get new schedule tomorrow.

## 2016-12-06 ENCOUNTER — Ambulatory Visit (HOSPITAL_BASED_OUTPATIENT_CLINIC_OR_DEPARTMENT_OTHER): Payer: Medicare Other

## 2016-12-06 ENCOUNTER — Other Ambulatory Visit (HOSPITAL_BASED_OUTPATIENT_CLINIC_OR_DEPARTMENT_OTHER): Payer: Medicare Other

## 2016-12-06 ENCOUNTER — Encounter: Payer: Self-pay | Admitting: Nurse Practitioner

## 2016-12-06 ENCOUNTER — Ambulatory Visit (HOSPITAL_BASED_OUTPATIENT_CLINIC_OR_DEPARTMENT_OTHER): Payer: Medicare Other | Admitting: Oncology

## 2016-12-06 ENCOUNTER — Other Ambulatory Visit: Payer: Self-pay | Admitting: *Deleted

## 2016-12-06 ENCOUNTER — Ambulatory Visit: Payer: Self-pay | Admitting: Nurse Practitioner

## 2016-12-06 VITALS — BP 140/62 | HR 100 | Temp 98.8°F | Resp 17 | Ht 68.0 in | Wt 163.0 lb

## 2016-12-06 VITALS — BP 129/67 | HR 98 | Temp 99.5°F | Resp 16

## 2016-12-06 DIAGNOSIS — R531 Weakness: Secondary | ICD-10-CM

## 2016-12-06 DIAGNOSIS — I10 Essential (primary) hypertension: Secondary | ICD-10-CM

## 2016-12-06 DIAGNOSIS — R630 Anorexia: Secondary | ICD-10-CM

## 2016-12-06 DIAGNOSIS — T7840XA Allergy, unspecified, initial encounter: Secondary | ICD-10-CM

## 2016-12-06 DIAGNOSIS — C9000 Multiple myeloma not having achieved remission: Secondary | ICD-10-CM

## 2016-12-06 DIAGNOSIS — Z7189 Other specified counseling: Secondary | ICD-10-CM | POA: Insufficient documentation

## 2016-12-06 DIAGNOSIS — W19XXXA Unspecified fall, initial encounter: Secondary | ICD-10-CM | POA: Insufficient documentation

## 2016-12-06 DIAGNOSIS — Z5112 Encounter for antineoplastic immunotherapy: Secondary | ICD-10-CM

## 2016-12-06 DIAGNOSIS — C9002 Multiple myeloma in relapse: Secondary | ICD-10-CM

## 2016-12-06 LAB — COMPREHENSIVE METABOLIC PANEL
ALBUMIN: 2.6 g/dL — AB (ref 3.5–5.0)
ALK PHOS: 87 U/L (ref 40–150)
ALT: 16 U/L (ref 0–55)
AST: 12 U/L (ref 5–34)
Anion Gap: 9 mEq/L (ref 3–11)
BUN: 19.2 mg/dL (ref 7.0–26.0)
CALCIUM: 10.1 mg/dL (ref 8.4–10.4)
CO2: 25 mEq/L (ref 22–29)
Chloride: 99 mEq/L (ref 98–109)
Creatinine: 0.9 mg/dL (ref 0.7–1.3)
EGFR: 80 mL/min/{1.73_m2} — AB (ref >90)
Glucose: 99 mg/dl (ref 70–140)
POTASSIUM: 4 meq/L (ref 3.5–5.1)
SODIUM: 133 meq/L — AB (ref 136–145)
Total Bilirubin: 0.64 mg/dL (ref 0.20–1.20)
Total Protein: 6.1 g/dL — ABNORMAL LOW (ref 6.4–8.3)

## 2016-12-06 LAB — CBC WITH DIFFERENTIAL/PLATELET
BASO%: 0.9 % (ref 0.0–2.0)
BASOS ABS: 0.1 10*3/uL (ref 0.0–0.1)
EOS ABS: 0.1 10*3/uL (ref 0.0–0.5)
EOS%: 1.1 % (ref 0.0–7.0)
HEMATOCRIT: 26.3 % — AB (ref 38.4–49.9)
HEMOGLOBIN: 9 g/dL — AB (ref 13.0–17.1)
LYMPH%: 10.5 % — ABNORMAL LOW (ref 14.0–49.0)
MCH: 31 pg (ref 27.2–33.4)
MCHC: 34.3 g/dL (ref 32.0–36.0)
MCV: 90.4 fL (ref 79.3–98.0)
MONO#: 0.8 10*3/uL (ref 0.1–0.9)
MONO%: 9.3 % (ref 0.0–14.0)
NEUT#: 6.8 10*3/uL — ABNORMAL HIGH (ref 1.5–6.5)
NEUT%: 78.2 % — AB (ref 39.0–75.0)
Platelets: 255 10*3/uL (ref 140–400)
RBC: 2.91 10*6/uL — ABNORMAL LOW (ref 4.20–5.82)
RDW: 20.6 % — AB (ref 11.0–14.6)
WBC: 8.6 10*3/uL (ref 4.0–10.3)
lymph#: 0.9 10*3/uL (ref 0.9–3.3)

## 2016-12-06 LAB — TECHNOLOGIST REVIEW

## 2016-12-06 MED ORDER — ACETAMINOPHEN 325 MG PO TABS
650.0000 mg | ORAL_TABLET | Freq: Once | ORAL | Status: AC
  Administered 2016-12-06: 650 mg via ORAL

## 2016-12-06 MED ORDER — MEPERIDINE HCL 25 MG/ML IJ SOLN
12.5000 mg | Freq: Once | INTRAMUSCULAR | Status: AC
  Administered 2016-12-06: 12.5 mg via INTRAVENOUS

## 2016-12-06 MED ORDER — PALONOSETRON HCL INJECTION 0.25 MG/5ML
INTRAVENOUS | Status: AC
  Filled 2016-12-06: qty 5

## 2016-12-06 MED ORDER — PROCHLORPERAZINE MALEATE 10 MG PO TABS
10.0000 mg | ORAL_TABLET | Freq: Once | ORAL | Status: AC
  Administered 2016-12-06: 10 mg via ORAL

## 2016-12-06 MED ORDER — DIPHENHYDRAMINE HCL 50 MG/ML IJ SOLN
25.0000 mg | Freq: Once | INTRAMUSCULAR | Status: AC | PRN
  Administered 2016-12-06: 25 mg via INTRAVENOUS

## 2016-12-06 MED ORDER — SODIUM CHLORIDE 0.9 % IV SOLN
15.7000 mg/kg | Freq: Once | INTRAVENOUS | Status: AC
  Administered 2016-12-06: 1200 mg via INTRAVENOUS
  Filled 2016-12-06: qty 40

## 2016-12-06 MED ORDER — ONDANSETRON HCL 4 MG PO TABS: 4.0000 mg | ORAL_TABLET | Freq: Three times a day (TID) | ORAL | 0 refills | Status: DC | PRN

## 2016-12-06 MED ORDER — SODIUM CHLORIDE 0.9 % IV SOLN
Freq: Once | INTRAVENOUS | Status: AC
  Administered 2016-12-06: 10:00:00 via INTRAVENOUS

## 2016-12-06 MED ORDER — DIPHENHYDRAMINE HCL 25 MG PO CAPS
50.0000 mg | ORAL_CAPSULE | Freq: Once | ORAL | Status: AC
  Administered 2016-12-06: 50 mg via ORAL

## 2016-12-06 MED ORDER — METHYLPREDNISOLONE SODIUM SUCC 125 MG IJ SOLR
125.0000 mg | Freq: Once | INTRAMUSCULAR | Status: AC | PRN
  Administered 2016-12-06: 125 mg via INTRAVENOUS

## 2016-12-06 MED ORDER — DIPHENHYDRAMINE HCL 25 MG PO CAPS
ORAL_CAPSULE | ORAL | Status: AC
  Filled 2016-12-06: qty 2

## 2016-12-06 MED ORDER — ACETAMINOPHEN 325 MG PO TABS
ORAL_TABLET | ORAL | Status: AC
  Filled 2016-12-06: qty 2

## 2016-12-06 MED ORDER — MEPERIDINE HCL 25 MG/ML IJ SOLN
INTRAMUSCULAR | Status: AC
  Filled 2016-12-06: qty 1

## 2016-12-06 MED ORDER — METHYLPREDNISOLONE SODIUM SUCC 125 MG IJ SOLR
INTRAMUSCULAR | Status: AC
  Filled 2016-12-06: qty 2

## 2016-12-06 MED ORDER — FAMOTIDINE IN NACL 20-0.9 MG/50ML-% IV SOLN
20.0000 mg | Freq: Once | INTRAVENOUS | Status: AC | PRN
  Administered 2016-12-06: 20 mg via INTRAVENOUS

## 2016-12-06 MED ORDER — MONTELUKAST SODIUM 10 MG PO TABS
10.0000 mg | ORAL_TABLET | Freq: Once | ORAL | Status: AC
  Administered 2016-12-06: 10 mg via ORAL

## 2016-12-06 MED ORDER — METHYLPREDNISOLONE SODIUM SUCC 125 MG IJ SOLR
125.0000 mg | Freq: Once | INTRAMUSCULAR | Status: AC
  Administered 2016-12-06: 125 mg via INTRAVENOUS

## 2016-12-06 MED ORDER — MONTELUKAST SODIUM 10 MG PO TABS
ORAL_TABLET | ORAL | Status: AC
  Filled 2016-12-06: qty 1

## 2016-12-06 MED ORDER — SODIUM CHLORIDE 0.9 % IV SOLN: Freq: Once | INTRAVENOUS | Status: DC | PRN

## 2016-12-06 MED ORDER — PROCHLORPERAZINE MALEATE 10 MG PO TABS
ORAL_TABLET | ORAL | Status: AC
  Filled 2016-12-06: qty 1

## 2016-12-06 NOTE — Patient Instructions (Signed)
Daratumumab injection What is this medicine? DARATUMUMAB (dar a toom ue mab) is a monoclonal antibody. It is used to treat multiple myeloma. COMMON BRAND NAME(S): DARZALEX What should I tell my health care provider before I take this medicine? They need to know if you have any of these conditions: -infection (especially a virus infection such as chickenpox, cold sores, or herpes) -lung or breathing disease -pregnant or trying to get pregnant -breast-feeding -an unusual or allergic reaction to daratumumab, other medicines, foods, dyes, or preservatives How should I use this medicine? This medicine is for infusion into a vein. It is given by a health care professional in a hospital or clinic setting. Talk to your pediatrician regarding the use of this medicine in children. Special care may be needed. What if I miss a dose? Keep appointments for follow-up doses as directed. It is important not to miss your dose. Call your doctor or health care professional if you are unable to keep an appointment. What may interact with this medicine? Interactions have not been studied. Give your health care provider a list of all the medicines, herbs, non-prescription drugs, or dietary supplements you use. Also tell them if you smoke, drink alcohol, or use illegal drugs. Some items may interact with your medicine. What should I watch for while using this medicine? This drug may make you feel generally unwell. Report any side effects. Continue your course of treatment even though you feel ill unless your doctor tells you to stop. This medicine can cause serious allergic reactions. To reduce your risk you may need to take medicine before treatment with this medicine. Take your medicine as directed. This medicine can affect the results of blood tests to match your blood type. These changes can last for up to 6 months after the final dose. Your healthcare provider will do blood tests to match your blood type before  you start treatment. Tell all of your healthcare providers that you are being treated with this medicine before receiving a blood transfusion. This medicine can affect the results of some tests used to determine treatment response; extra tests may be needed to evaluate response. Do not become pregnant while taking this medicine or for 3 months after stopping it. Women should inform their doctor if they wish to become pregnant or think they might be pregnant. There is a potential for serious side effects to an unborn child. Talk to your health care professional or pharmacist for more information. What side effects may I notice from receiving this medicine? Side effects that you should report to your doctor or health care professional as soon as possible: -allergic reactions like skin rash, itching or hives, swelling of the face, lips, or tongue -breathing problems -chills -cough -dizziness -feeling faint or lightheaded -headache -low blood counts - this medicine may decrease the number of white blood cells, red blood cells and platelets. You may be at increased risk for infections and bleeding. -nausea, vomiting -shortness of breath -signs of decreased platelets or bleeding - bruising, pinpoint red spots on the skin, black, tarry stools, blood in the urine -signs of decreased red blood cells - unusually weak or tired, feeling faint or lightheaded, falls -signs of infection - fever or chills, cough, sore throat, pain or difficulty passing urine Side effects that usually do not require medical attention (report to your doctor or health care professional if they continue or are bothersome): -back pain -diarrhea -muscle cramps -pain, tingling, numbness in the hands or feet -swelling of the ankles,   feet, hands -tiredness Where should I keep my medicine? Keep out of the reach of children. This drug is given in a hospital or clinic and will not be stored at home.  2017 Elsevier/Gold Standard  (2015-12-28 10:38:11)  

## 2016-12-06 NOTE — Progress Notes (Signed)
[id number] - Patient alert and oriented without complaint. Afebrile. Tachycardia resolved. Patient states he feels he is at baseline. Dr. Marko Plume notified via phone. Advised ok to discharge. Informed patient's daughter to have patient evaluated in ED if symptoms reappear or condition worsens in any way. Verbalized understanding. Patient taken to car in wheelchair and assisted into vehicle without incident.

## 2016-12-06 NOTE — Assessment & Plan Note (Signed)
Patient was being assisted to the bathroom; when he fell directly in front of the bathroom in the [employer] hallway of the infusion room.  He denies hitting his head or any loss of consciousness.  He did experience an abrasion to the right elbow region only.  This elbow abrasion was cleaned with saline and a Vaseline gauze dressing was applied.  Patient observed with full range of motion and neurologically intact after the fall.  Patient will be given a Vaseline gauze dressing changes to applied to the elbow wound.

## 2016-12-06 NOTE — Progress Notes (Signed)
At 1705, patient stated he needed to use the restroom. Assisted patient up and ambulated him with his walker to the bathroom. While ambulating, patient lost balance and fell in the hallway, onto his right elbow. NP Selena Lesser notified and arrived. Patient assessed and assisted into wheelchair. After in wheelchair, patient further assessed and a skin tear noted on right elbow. Skin tear cleansed and bandaged. Patient assisted to bathroom in wheelchair and then back into infusion chair with nurses x2. Vitals taken and recorded. Family notified and safety zone completed.   Wylene Simmer, BSN, RN 12/06/2016 5:59 PM

## 2016-12-06 NOTE — Progress Notes (Signed)
At approximately 1551 pt started to shake and have chills.  Infusion stopped and [employer] notified.  VSS obtained and pt found to have slightly elevated heart rate and temp of 100.4.  IV pepcid, benadryl and demerol given.  Chilling and shaking stopped and temp back down to 98.1 after blankets removed. Per Dr Learta Codding, infusion to restart at rate of 155mls/hr and remain at that rate for remainder of infusion.   At 1809 pt spiked temp of 100.7.  Per Dr Marko Plume, stop Daratumumab and give Solumedrol.  Pt will not get anymore Daratumumab today.

## 2016-12-06 NOTE — Progress Notes (Signed)
[facility] OFFICE PROGRESS NOTE   Diagnosis: Multiple myeloma  INTERVAL HISTORY:   Richard [name] returns as scheduled. His daughter reports he continues to be "weak ". He has anorexia. He saw Dr. Percival Spanish for the irregular heart rate and did not have atrial fibrillation. He completed a Holter monitor earlier this week. He fell on 12/02/2016 and injured his sacrum.  Objective:  Vital signs in last 24 hours:  Blood pressure 140/62, pulse 100, temperature 98.8 F (37.1 C), temperature source Oral, resp. rate 17, height 5' 8"  (1.727 m), weight 163 lb (73.9 kg), SpO2 97 %.    Resp: Coarse rhonchi at the posterior basis, no respiratory distress Cardio: Regular rhythm with premature beats GI: Nontender, no hepatosplenomegaly Vascular: No leg edema Neuro: Ambulates with a walker. Alert. Follows commands.  Musculoskeletal: Tenderness at the right side of the sacrum  = Lab Results:  Lab Results  Component Value Date   WBC 3.3 (L) 11/21/2016   HGB 9.1 (L) 11/21/2016   HCT 27.1 (L) 11/21/2016   MCV 90.7 11/21/2016   PLT 189 11/21/2016   NEUTROABS 1.9 11/21/2016   11/21/2016: Lambda free light chains 404  Medications: I have reviewed the patient's current medications.  Assessment/Plan: 1. Light chains were elevated 08/29/2009. A CT of the chest 08/31/2009 showed a new pleural mass consistent with progression of multiple myeloma. He completed 12 cycles of Revlimid/Decadron. The serum free light chains were improved to 4.34 on 12/29/2009. A restaging CT of the chest 02/19/2010 showed resolution of right axillary lymphadenopathy and a pleural-based mass. The serum free lambda light chains were again elevated.  Salvage Revlimid/Decadron initiated 12/08/2014  Cycle 2 01/05/2015  Serum light chains improved 01/24/2015  Cycle 3 02/01/2015  Cycle 4 03/02/2015  Cycle 5 03/30/2015  Cycle 6 04/27/2015  Cycle 7 05/25/2015  Cycle 8 06/22/2015  Cycle 9  07/20/2015  Cycle 10 08/18/2015  Changed to maintenance Revlimid beginning 09/23/2015  Progressive anemia and Elevated serum free light chains 11/13/2015  Changed to weekly Cytoxan/Velcade/Decadron beginning 11/21/2015, changed to 3/4 week schedule beginning 01/02/2016  Serum light chains improved on 03/26/2016  Serum light chains increased 04/23/2016  Serum light chains increased 05/21/2016  Cycle 1 pomalidomide/decadron 05/30/2016  Cycle 2 pomalidomide/Decadron 06/27/2016  Serum light chains improved 07/03/2016  Cycle 3 Pomalidomide/Decadron 07/25/2016  Cycle 4 Pomalidomide/Decadron 09/04/2016  Cycle 5 Pomalidomide/Decadron 10/02/2016  Cycle 6 Pomalidomide/Decadron 11/08/2016  Progressive anemia, elevated light chains December 2017  Salvage Daratumumab/dex started 12/06/2016 2.History of back pain, likely related to the pleural-based mass at the right chest, resolved. 2. Right axillary/subpectoral lymphadenopathy on a CT of the chest 08/31/2009, likely related to multiple myeloma, resolved. 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 He is maintained on every three-month Zometa. 4. Pancytopenia secondary to Revlimid and multiple myeloma. He has persistent mild thrombocytopenia. 5. Hypertension. 6. Status post hip replacement. 7. History of back surgery. 8. History of trigeminal neuralgia. 9. Status post removal of a lipoma from the right axilla in February 2010. 10. 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 11. Esophageal reflux disease, followed by Dr. Janace Hoard.  12. Esophageal stricture, status post an evaluation by Dr. Henrene Pastor. 13. Right vocal cord lesion, status post a biopsy by Dr. 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 He completed radiation under the direction of Dr. Valere Dross on 03/15/2011. 14. Admission with pneumonia 03/26/2010. 15. History of Atrial flutter/fibrillation while hospitalized  August 2011. He is followed by Dr. Percival Spanish. 16. Elevated prostate specific  antigen, status post a biopsy 01/23/2011. He was found to have a Gleason 6 cancer in 10% of the cores. He is followed with an observation approach. He is now followed by Dr. Diona Fanti. 17. History of Mild hypercalcemia  18. Fall with a left wrist fracture June 2015  19. Right chest wall pain secondary to a right second rib plasmacytoma confirmed on a CT 11/28/2014, resolved 20. History of Hyponatremia 21. history of Thrombocytopenia secondary progression of multiple myeloma and chemotherapy 22. Fall with left femoral neck fracture 06/02/2016, left hemiarthroplasty 06/03/2016 23. Hospital delirium June 2017 24. Zoster rash, left thigh, November 2017     Disposition:  Richard [name] has progressive multiple myeloma. I suspect the weakness and anorexia are related to progression of the myeloma. He will begin salvage treatment with Daratumumab/dex today. He also likely has a degree of dementia. I recommended he moved in with his daughter. We can arrange for a home hospital bed and bedside commode if he will agree to this. He has home PT/OT in place and the daughter reports she plans to contract with a home health aide.  He will return for an office visit and treatment in one week.  Betsy Coder, MD  12/06/2016  9:26 AM

## 2016-12-06 NOTE — Progress Notes (Signed)
SYMPTOM MANAGEMENT CLINIC    Chief Complaint: Hypersensitivity reaction, fall  HPI:  Richard [name] 82 y.o. male diagnosed with multiple myeloma.  Presents to the cancer center to initiate daratumumab antibody therapy.     No history exists.    Review of Systems  Constitutional: Positive for malaise/fatigue.  Skin:       Elbow abrasion  All other systems reviewed and are negative.   Past Medical History:  Diagnosis Date  . Anemia    pt's daughter denies  . Arthritis   . Atrial fibrillation ([place])   . 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 Cataract    bil removed  . Cholelithiasis   . Dyslipidemia   . Dysphagia following unspecified cerebrovascular disease   . Esophageal stricture   . Essential hypertension, benign   . Facial basal cell cancer    nose  . GERD (gastroesophageal reflux disease)   . Hiatal hernia   . History of BPH   . History of radiation therapy 01/29/11 -03/15/11   larynx 6600 cGy 33 sessions  . Hyperbilirubinemia   . Hypertension   . Hypokalemia   . Hyponatremia   . Left displaced femoral neck fracture ([place])   . Multiple myeloma   . Pancytopenia ([place])   . Pneumonia   . Prostate cancer ([place]) 01/23/11   Gleason 3+3=6  . Pulmonary hypertension     Past Surgical History:  Procedure Laterality Date  . APPENDECTOMY    . BACK SURGERY    . COLONOSCOPY    . ESOPHAGEAL DILATION    . HIP ARTHROPLASTY Left 06/03/2016   Procedure: ARTHROPLASTY BIPOLAR HIP LEFT  (HEMIARTHROPLASTY);  Surgeon: Paralee Cancel, MD;  Location: [facility];  Service: Orthopedics;  Laterality: Left;  . HIP SURGERY    . right chest wall resection     of second ,third,and fourth rib  . TONSILLECTOMY    . trigeminal neuralgia    . UPPER GASTROINTESTINAL ENDOSCOPY      has Essential hypertension, benign; HYPERTENSION, PULMONARY; ATRIAL FIBRILLATION; ESOPHAGEAL STRICTURE; GERD; CHOLELITHIASIS; CHOLELITHIASIS; MURMUR; DYSPHAGIA UNSPECIFIED; ABNORMAL FINDINGS GI TRACT;  Multiple myeloma ([place]); Vocal cord cancer ([place]); Prostate cancer ([place]); Multiple myeloma ([place]); Cancer of true vocal cord ([place]); Hordeolum externum (stye); Hypokalemia; Dehydration; Pancytopenia ([place]); Hyperbilirubinemia; Left displaced femoral neck fracture ([place]); Hip fracture ([place]); Hyponatremia; Altered mental status; Delirium; Edema; BPH (benign prostatic hypertrophy); Restless leg syndrome; Goals of care, counseling/discussion; Hypersensitivity reaction; and Fall on his problem list.    is allergic to celebrex [celecoxib] and coricidin d cold-flu-sinus  [chlorphen-pe-acetaminophen].  Allergies as of 12/06/2016      Reactions   Celebrex [celecoxib] Other (See Comments)   GI bleeding   Coricidin D Cold-flu-sinus  [chlorphen-pe-acetaminophen] Hives      Medication List       Accurate as of 12/06/16  5:56 PM. Always use your most recent med list.          acetaminophen 325 MG tablet Commonly known as:  TYLENOL Take 2 tablets (650 mg total) by mouth every 6 (six) hours as needed for mild pain, fever or headache.   ACYCLOVIR PO Take 500 mg by mouth at bedtime.   aspirin EC 81 MG tablet Take 81 mg by mouth every morning.   B-12 2500 MCG Subl Take 2,500 mcg by mouth every morning.   Cholecalciferol 5000 units Tabs Take 5,000 Units by mouth every morning.   dexamethasone 4 MG tablet Commonly known as:  DECADRON  TAKE 10 TABS BY MOUTH ONCE A WEEK   docusate sodium 100 MG capsule Commonly known as:  COLACE Take 100 mg by mouth 2 (two) times daily.   feeding supplement (ENSURE ENLIVE) Liqd Take 237 mLs by mouth 2 (two) times daily between meals.   ferrous sulfate 325 (65 FE) MG tablet Take 650 mg by mouth every other day.   HYDROcodone-acetaminophen 5-325 MG tablet Commonly known as:  NORCO/VICODIN .5-1 tablet every 6 hours as needed   losartan-hydrochlorothiazide 50-12.5 MG tablet Commonly known as:  HYZAAR Take 0.5 tablets by mouth daily.   multivitamin with  minerals Tabs tablet Take 1 tablet by mouth every morning.   omeprazole 40 MG capsule Commonly known as:  PRILOSEC Take 40 mg by mouth daily.   ondansetron 4 MG tablet Commonly known as:  ZOFRAN Take 1 tablet (4 mg total) by mouth every 8 (eight) hours as needed for nausea or vomiting.   polyethylene glycol packet Commonly known as:  MIRALAX / GLYCOLAX Take 17 g by mouth daily as needed.   rOPINIRole 0.25 MG tablet Commonly known as:  REQUIP Take 1-2 tablet by mouth once daily at bedtime As needed for restless legs   Saw Palmetto 160 MG Caps Take by mouth.   tamsulosin 0.4 MG Caps capsule Commonly known as:  FLOMAX Take 0.4 mg by mouth daily with breakfast.        PHYSICAL EXAMINATION  Oncology Vitals 12/06/2016 12/06/2016  Height - -  Weight - -  Weight (lbs) - -  BMI (kg/m2) - -  Temp 99.2 -  Pulse 119 114  Resp - -  Resp (Historical as of 07/09/12) - -  SpO2 98 98  BSA (m2) - -   BP Readings from Last 2 Encounters:  12/06/16 131/66  12/06/16 140/62    Physical Exam  Constitutional: He is oriented to person, place, and time. He appears malnourished. He appears unhealthy. He appears cachectic.  HENT:  Head: Normocephalic and atraumatic.  Eyes: Conjunctivae and EOM are normal. Pupils are equal, round, and reactive to light. Right eye exhibits no discharge. Left eye exhibits no discharge. No scleral icterus.  Neck: Normal range of motion.  Pulmonary/Chest: He is in respiratory distress.  Musculoskeletal: Normal range of motion.  Neurological: He is alert and oriented to person, place, and time.  Skin: Skin is warm and dry. There is pallor.  Approximately 3 cm in diameter.  Right elbow abrasion.  After the fall.  Psychiatric: Affect normal.  Nursing note and vitals reviewed.   LABORATORY DATA:. Appointment on 12/06/2016  Component Date Value Ref Range Status  . WBC 12/06/2016 8.6  4.0 - 10.3 10e3/uL Final  . NEUT# 12/06/2016 6.8* 1.5 - 6.5 10e3/uL Final    . HGB 12/06/2016 9.0* 13.0 - 17.1 g/dL Final  . HCT 12/06/2016 26.3* 38.4 - 49.9 % Final  . Platelets 12/06/2016 255  140 - 400 10e3/uL Final  . MCV 12/06/2016 90.4  79.3 - 98.0 fL Final  . MCH 12/06/2016 31.0  27.2 - 33.4 pg Final  . MCHC 12/06/2016 34.3  32.0 - 36.0 g/dL Final  . RBC 12/06/2016 2.91* 4.20 - 5.82 10e6/uL Final  . RDW 12/06/2016 20.6* 11.0 - 14.6 % Final  . lymph# 12/06/2016 0.9  0.9 - 3.3 10e3/uL Final  . MONO# 12/06/2016 0.8  0.1 - 0.9 10e3/uL Final  . Eosinophils Absolute 12/06/2016 0.1  0.0 - 0.5 10e3/uL Final  . Basophils Absolute 12/06/2016 0.1  0.0 - 0.1 10e3/uL  Final  . NEUT% 12/06/2016 78.2* 39.0 - 75.0 % Final  . LYMPH% 12/06/2016 10.5* 14.0 - 49.0 % Final  . MONO% 12/06/2016 9.3  0.0 - 14.0 % Final  . EOS% 12/06/2016 1.1  0.0 - 7.0 % Final  . BASO% 12/06/2016 0.9  0.0 - 2.0 % Final  . Sodium 12/06/2016 133* 136 - 145 mEq/L Final  . Potassium 12/06/2016 4.0  3.5 - 5.1 mEq/L Final  . Chloride 12/06/2016 99  98 - 109 mEq/L Final  . CO2 12/06/2016 25  22 - 29 mEq/L Final  . Glucose 12/06/2016 99  70 - 140 mg/dl Final  . BUN 12/06/2016 19.2  7.0 - 26.0 mg/dL Final  . Creatinine 12/06/2016 0.9  0.7 - 1.3 mg/dL Final  . Total Bilirubin 12/06/2016 0.64  0.20 - 1.20 mg/dL Final  . Alkaline Phosphatase 12/06/2016 87  40 - 150 U/L Final  . AST 12/06/2016 12  5 - 34 U/L Final  . ALT 12/06/2016 16  0 - 55 U/L Final  . Total Protein 12/06/2016 6.1* 6.4 - 8.3 g/dL Final  . Albumin 12/06/2016 2.6* 3.5 - 5.0 g/dL Final  . Calcium 12/06/2016 10.1  8.4 - 10.4 mg/dL Final  . Anion Gap 12/06/2016 9  3 - 11 mEq/L Final  . EGFR 12/06/2016 80* >90 ml/min/1.73 m2 Final  . Technologist Review 12/06/2016 Metas and Myelocytes present, Rare Nrbc, sl poly, few ovalos and teardrop   Final    RADIOGRAPHIC STUDIES: No results found.  ASSESSMENT/PLAN:    Multiple myeloma [facility]) Patient presented to the [facility] today to receive his first cycle of Daratumumab antibody  therapy.  See further notes for details of today's visit.  Patient is scheduled to return on 12/13/2016 for his next infusion.  Will review/confirm with Dr. Benay Spice and patient should expect his next follow-up visit here at the cancer center.   Hypersensitivity reaction Patient had received approximately half of the Daratumumab antibody infusion; when he developed mild rigors.  Warm blankets were placed on patient's; and patient's infusion was held.  Patient received Benadryl, Pepcid, and Demerol 12.5 mg IV.  All symptoms resolved; but it was noted that patient's temperature increased to 100.4 briefly.  Most likely, the elevated temperature was secondary to all of the warm blankets on the patient.  Warm blankets were removed; the patient's temperature returned to normal baseline.  Patient was able to resume his infusion as previously directed.  Due to the hypersensitivity reaction-pharmacist recommended that patient continue at 100 mg/h of the infusion and to avoid increasing the rate for the rest of this evening.  Patient will most likely be able to complete approximately three fourths of the infusion prior to the closing of the cancer Center this evening.  Will review all with Dr. Benay Spice when he returns on Tuesday, 12/10/2016.  Fall Patient was being assisted to the bathroom; when he fell directly in front of the bathroom in the [place] hallway of the infusion room.  He denies hitting his head or any loss of consciousness.  He did experience an abrasion to the right elbow region only.  This elbow abrasion was cleaned with saline and a Vaseline gauze dressing was applied.  Patient observed with full range of motion and neurologically intact after the fall.  Patient will be given a Vaseline gauze dressing changes to applied to the elbow wound.   Patient stated understanding of all instructions; and was in agreement with this plan of care. The patient knows to  call the clinic with any  problems, questions or concerns.   Total time spent with patient was 40 minutes;  with greater than 75 percent of that time spent in face to face counseling regarding patient's symptoms,  and coordination of care and follow up.  Disclaimer:This dictation was prepared with Dragon/digital dictation along with [employer]. Any transcriptional errors that result from this process are unintentional.  Drue Second, NP 12/06/2016

## 2016-12-06 NOTE — Assessment & Plan Note (Signed)
Patient presented to the [facility] today to receive his first cycle of Daratumumab antibody therapy.  See further notes for details of today's visit.  Patient is scheduled to return on 12/13/2016 for his next infusion.  Will review/confirm with Dr. Benay Spice and patient should expect his next follow-up visit here at the cancer center.

## 2016-12-06 NOTE — Assessment & Plan Note (Signed)
Patient had received approximately half of the Daratumumab antibody infusion; when he developed mild rigors.  Warm blankets were placed on patient's; and patient's infusion was held.  Patient received Benadryl, Pepcid, and Demerol 12.5 mg IV.  All symptoms resolved; but it was noted that patient's temperature increased to 100.4 briefly.  Most likely, the elevated temperature was secondary to all of the warm blankets on the patient.  Warm blankets were removed; the patient's temperature returned to normal baseline.  Patient was able to resume his infusion as previously directed.  Due to the hypersensitivity reaction-pharmacist recommended that patient continue at 100 mg/h of the infusion and to avoid increasing the rate for the rest of this evening.  Patient will most likely be able to complete approximately three fourths of the infusion prior to the closing of the cancer Center this evening.  Will review all with Dr. Benay Spice when he returns on Tuesday, 12/10/2016.

## 2016-12-09 ENCOUNTER — Other Ambulatory Visit: Payer: Self-pay | Admitting: Oncology

## 2016-12-10 ENCOUNTER — Telehealth: Payer: Self-pay | Admitting: Oncology

## 2016-12-10 NOTE — Telephone Encounter (Signed)
left voicemail message to confirm next appointment on 12/13/16 @ 8:30 AM

## 2016-12-11 NOTE — Progress Notes (Deleted)
Cardiology Office Note   Date:  12/11/2016   ID:  Richard [name], DOB December 31, 1932, MRN [id number]  PCP:  Rachell Cipro, MD  Cardiologist:   Minus Breeding, MD  Referring:  Dr. Julieanne Manson   No chief complaint on file.     History of Present Illness: Richard [name] is a 83 y.o. male who presents for follow up of atrial fib.  He had this in the past when she was acutely ill.   He was in the oncology office recently and was thought to be in atrial fib.  However, he had MAT.  He did not have atrial fib.  I had him where a Holter which did not demonstrate atrial fib.  ***   .   I have not seen him since 2014.   He did have an echo in June of this year.  This showed some mild AS but normal EF.   This was done when he was in the hospital with a broken hip. I reviewed some of these records but I don't see that there were any specific cardiac issues. The patient was being seen in the oncology office recently and yesterday was thought to have atrial fibrillation on EKG. 8. This actually demonstrated sinus rhythm with multifocal atrial tachycardia. His right bundle branch. He has had some leg weakness and it was wondered whether this could be related to her cardiac issue.  He doesn't feel any palpitations. He's had no presyncope or syncope. Denies any chest pressure discomfort.  He lives by himself though his daughter comes and checks on him.  It's very difficult to get an assessment of cardiac complaints as he seems to be somewhat confused about details. He apparently does get short of breath with activity by observation and he says he has to rest and he'll get better he seems to minimize this and is not describing other associated symptoms. He clearly has leg weakness and walks with a walker.  Past Medical History:  Diagnosis Date  . Anemia    pt's daughter denies  . Arthritis   . Atrial fibrillation ([place])   . 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 Cataract    bil removed  .  Cholelithiasis   . Dyslipidemia   . Dysphagia following unspecified cerebrovascular disease   . Esophageal stricture   . Essential hypertension, benign   . Facial basal cell cancer    nose  . GERD (gastroesophageal reflux disease)   . Hiatal hernia   . History of BPH   . History of radiation therapy 01/29/11 -03/15/11   larynx 6600 cGy 33 sessions  . Hyperbilirubinemia   . Hypertension   . Hypokalemia   . Hyponatremia   . Left displaced femoral neck fracture ([place])   . Multiple myeloma   . Pancytopenia ([place])   . Pneumonia   . Prostate cancer ([place]) 01/23/11   Gleason 3+3=6  . Pulmonary hypertension     Past Surgical History:  Procedure Laterality Date  . APPENDECTOMY    . BACK SURGERY    . COLONOSCOPY    . ESOPHAGEAL DILATION    . HIP ARTHROPLASTY Left 06/03/2016   Procedure: ARTHROPLASTY BIPOLAR HIP LEFT  (HEMIARTHROPLASTY);  Surgeon: Paralee Cancel, MD;  Location: [facility];  Service: Orthopedics;  Laterality: Left;  . HIP SURGERY    . right chest wall resection     of second ,third,and fourth rib  . TONSILLECTOMY    . trigeminal  neuralgia    . UPPER GASTROINTESTINAL ENDOSCOPY       Current Outpatient Prescriptions  Medication Sig Dispense Refill  . acetaminophen (TYLENOL) 325 MG tablet Take 2 tablets (650 mg total) by mouth every 6 (six) hours as needed for mild pain, fever or headache.    . ACYCLOVIR PO Take 500 mg by mouth at bedtime.    Marland Kitchen aspirin EC 81 MG tablet Take 81 mg by mouth every morning.    . Cholecalciferol 5000 UNITS TABS Take 5,000 Units by mouth every morning.     . Cyanocobalamin (B-12) 2500 MCG SUBL Take 2,500 mcg by mouth every morning.     Marland Kitchen dexamethasone (DECADRON) 4 MG tablet TAKE 10 TABS BY MOUTH ONCE A WEEK 40 tablet 3  . docusate sodium (COLACE) 100 MG capsule Take 100 mg by mouth 2 (two) times daily.    . feeding supplement, ENSURE ENLIVE, (ENSURE ENLIVE) LIQD Take 237 mLs by mouth 2 (two) times daily between meals.    . ferrous sulfate 325 (65 FE)  MG tablet Take 650 mg by mouth every other day.    Marland Kitchen HYDROcodone-acetaminophen (NORCO/VICODIN) 5-325 MG tablet .5-1 tablet every 6 hours as needed (Patient not taking: Reported on 12/06/2016) 30 tablet 0  . losartan-hydrochlorothiazide (HYZAAR) 50-12.5 MG tablet Take 0.5 tablets by mouth daily.     . Multiple Vitamin (MULTIVITAMIN WITH MINERALS) TABS tablet Take 1 tablet by mouth every morning.    Marland Kitchen omeprazole (PRILOSEC) 40 MG capsule Take 40 mg by mouth daily.  0  . ondansetron (ZOFRAN) 4 MG tablet Take 1 tablet (4 mg total) by mouth every 8 (eight) hours as needed for nausea or vomiting. 20 tablet 0  . polyethylene glycol (MIRALAX / GLYCOLAX) packet Take 17 g by mouth daily as needed.    Marland Kitchen rOPINIRole (REQUIP) 0.25 MG tablet Take 1-2 tablet by mouth once daily at bedtime As needed for restless legs    . Saw Palmetto 160 MG CAPS Take by mouth.    . tamsulosin (FLOMAX) 0.4 MG CAPS capsule Take 0.4 mg by mouth daily with breakfast.     No current facility-administered medications for this visit.     Allergies:   Celebrex [celecoxib] and Coricidin d cold-flu-sinus  [chlorphen-pe-acetaminophen]    ROS:  Please see the history of present illness.   Otherwise, review of systems are positive for ***.   All other systems are reviewed and negative.    PHYSICAL EXAM: VS:  There were no vitals taken for this visit. , BMI There is no height or weight on file to calculate BMI. GENERAL:  Frail appearing, dentures HEENT:  Pupils equal round and reactive, fundi not visualized, oral mucosa unremarkable NECK:  No jugular venous distention, waveform within normal limits, carotid upstroke brisk and symmetric, no bruits, no thyromegaly LYMPHATICS:  No cervical, inguinal adenopathy LUNGS:  Clear to auscultation bilaterally BACK:  No CVA tenderness CHEST:  Unremarkable HEART:  PMI not displaced or sustained,S1 and S2 within normal limits, no S3, no S4, no clicks, no rubs, 2 out of 6 apical systolic murmur  early peaking and nonradiating, no diastolic murmurs ABD:  Flat, positive bowel sounds normal in frequency in pitch, no bruits, no rebound, no guarding, no midline pulsatile mass, no hepatomegaly, no splenomegaly EXT:  2 plus pulses throughout, no edema, no cyanosis no clubbing SKIN:  No rashes no nodules NEURO:  Cranial nerves II through XII grossly intact, motor grossly intact throughout PSYCH:  Cognitively intact, oriented to  person place and time    EKG:  EKG is *** ordered today. The ekg ordered today demonstrates sinus rhythm, rate *** , frequent and consecutive premature atrial contractions, right bundle branch block, left anterior fascicular block   Recent Labs: 05/27/2016: Magnesium 1.9 06/04/2016: TSH 0.512 12/06/2016: ALT 16; BUN 19.2; Creatinine 0.9; HGB 9.0; Platelets 255; Potassium 4.0; Sodium 133    Lipid Panel    Component Value Date/Time   CHOL  07/27/2010 0132    82        ATP III CLASSIFICATION:  <200     mg/dL   Desirable  200-239  mg/dL   Borderline High  >=240    mg/dL   High          TRIG 75 07/27/2010 0132   HDL 31 (L) 07/27/2010 0132   CHOLHDL 2.6 07/27/2010 0132   VLDL 15 07/27/2010 0132   LDLCALC  07/27/2010 0132    36        Total Cholesterol/HDL:CHD Risk Coronary Heart Disease Risk Table                     Men   Women  1/2 Average Risk   3.4   3.3  Average Risk       5.0   4.4  2 X Average Risk   9.6   7.1  3 X Average Risk  23.4   11.0        Use the calculated Patient Ratio above and the CHD Risk Table to determine the patient's CHD Risk.        ATP III CLASSIFICATION (LDL):  <100     mg/dL   Optimal  100-129  mg/dL   Near or Above                    Optimal  130-159  mg/dL   Borderline  160-189  mg/dL   High  >190     mg/dL   Very High      Wt Readings from Last 3 Encounters:  12/06/16 163 lb (73.9 kg)  11/22/16 166 lb 9.6 oz (75.6 kg)  11/21/16 168 lb 1.6 oz (76.2 kg)      Other studies Reviewed: Additional studies/  records that were reviewed today include: *** Review of the above records demonstrates:  ***    ASSESSMENT AND PLAN:  ATRIAL FIB:  *** The patient has had atrial fibrillation in the past but this was when he was acutely ill. I don't see that there was recent documentation of this. He was not in atrial fibrillation yesterday on the EKG despite the computer interpretation. This is not to say that he might not have this rhythm. However, I doubt that the dysrhythmia he is currently demonstrating is causing his balance issues, leg weakness, dizziness and decreased exercise tolerance with dyspnea on exertion. He doesn't seem to be in heart failure as he's had an echo recently he's not describing the symptoms. I can't exclude atrial fibrillation, sustained symptomatic tachycardia or bradycardia arrhythmias without documentation and so I'll obtain a Holter monitor. Certainly if we see atrial fibrillation be a very difficult decision needs to be made concerning the use of anticoagulation as he is certainly high for fall risk  ABNORMAL ECHO:  He has mild aortic stenosis. ***   Current medicines are reviewed at length with the patient today.  The patient does not have concerns regarding medicines.  The following changes have been made:  ***  Labs/ tests ordered today include: ***  No orders of the defined types were placed in this encounter.    Disposition:   FU with me ***.      Signed, Minus Breeding, MD  12/11/2016 10:11 PM    [facility]

## 2016-12-12 ENCOUNTER — Ambulatory Visit: Payer: Medicare Other | Admitting: Cardiology

## 2016-12-12 ENCOUNTER — Observation Stay (HOSPITAL_COMMUNITY)
Admission: EM | Admit: 2016-12-12 | Discharge: 2016-12-14 | Disposition: A | Discharge status: Skilled Nursing Facility | Payer: Medicare Other | Attending: Internal Medicine | Admitting: Internal Medicine

## 2016-12-12 ENCOUNTER — Telehealth: Payer: Self-pay | Admitting: Cardiology

## 2016-12-12 ENCOUNTER — Encounter (HOSPITAL_COMMUNITY): Payer: Self-pay | Admitting: Emergency Medicine

## 2016-12-12 ENCOUNTER — Emergency Department (HOSPITAL_COMMUNITY): Payer: Medicare Other

## 2016-12-12 DIAGNOSIS — J9601 Acute respiratory failure with hypoxia: Secondary | ICD-10-CM | POA: Diagnosis not present

## 2016-12-12 DIAGNOSIS — Z888 Allergy status to other drugs, medicaments and biological substances status: Secondary | ICD-10-CM | POA: Insufficient documentation

## 2016-12-12 DIAGNOSIS — C9000 Multiple myeloma not having achieved remission: Secondary | ICD-10-CM | POA: Diagnosis not present

## 2016-12-12 DIAGNOSIS — I2510 Atherosclerotic heart disease of native coronary artery without angina pectoris: Secondary | ICD-10-CM | POA: Diagnosis not present

## 2016-12-12 DIAGNOSIS — E871 Hypo-osmolality and hyponatremia: Secondary | ICD-10-CM | POA: Diagnosis not present

## 2016-12-12 DIAGNOSIS — R0602 Shortness of breath: Secondary | ICD-10-CM

## 2016-12-12 DIAGNOSIS — E876 Hypokalemia: Secondary | ICD-10-CM | POA: Diagnosis present

## 2016-12-12 DIAGNOSIS — Z87891 Personal history of nicotine dependence: Secondary | ICD-10-CM | POA: Insufficient documentation

## 2016-12-12 DIAGNOSIS — R296 Repeated falls: Secondary | ICD-10-CM | POA: Insufficient documentation

## 2016-12-12 DIAGNOSIS — R2681 Unsteadiness on feet: Secondary | ICD-10-CM

## 2016-12-12 DIAGNOSIS — K5900 Constipation, unspecified: Secondary | ICD-10-CM | POA: Insufficient documentation

## 2016-12-12 DIAGNOSIS — I4891 Unspecified atrial fibrillation: Secondary | ICD-10-CM | POA: Diagnosis present

## 2016-12-12 DIAGNOSIS — Z8546 Personal history of malignant neoplasm of prostate: Secondary | ICD-10-CM | POA: Insufficient documentation

## 2016-12-12 DIAGNOSIS — Z7982 Long term (current) use of aspirin: Secondary | ICD-10-CM | POA: Insufficient documentation

## 2016-12-12 DIAGNOSIS — G2581 Restless legs syndrome: Secondary | ICD-10-CM | POA: Diagnosis not present

## 2016-12-12 DIAGNOSIS — K8020 Calculus of gallbladder without cholecystitis without obstruction: Secondary | ICD-10-CM | POA: Diagnosis not present

## 2016-12-12 DIAGNOSIS — S2249XA Multiple fractures of ribs, unspecified side, initial encounter for closed fracture: Secondary | ICD-10-CM | POA: Diagnosis present

## 2016-12-12 DIAGNOSIS — Z9221 Personal history of antineoplastic chemotherapy: Secondary | ICD-10-CM | POA: Insufficient documentation

## 2016-12-12 DIAGNOSIS — I4510 Unspecified right bundle-branch block: Secondary | ICD-10-CM | POA: Diagnosis not present

## 2016-12-12 DIAGNOSIS — R000 Tachycardia, unspecified: Secondary | ICD-10-CM

## 2016-12-12 DIAGNOSIS — R269 Unspecified abnormalities of gait and mobility: Secondary | ICD-10-CM | POA: Insufficient documentation

## 2016-12-12 DIAGNOSIS — I270 Primary pulmonary hypertension: Secondary | ICD-10-CM | POA: Insufficient documentation

## 2016-12-12 DIAGNOSIS — Z9889 Other specified postprocedural states: Secondary | ICD-10-CM | POA: Diagnosis not present

## 2016-12-12 DIAGNOSIS — Y92003 Bedroom of unspecified non-institutional (private) residence as the place of occurrence of the external cause: Secondary | ICD-10-CM | POA: Diagnosis not present

## 2016-12-12 DIAGNOSIS — W06XXXA Fall from bed, initial encounter: Secondary | ICD-10-CM | POA: Insufficient documentation

## 2016-12-12 DIAGNOSIS — I10 Essential (primary) hypertension: Secondary | ICD-10-CM | POA: Diagnosis not present

## 2016-12-12 DIAGNOSIS — Z8521 Personal history of malignant neoplasm of larynx: Secondary | ICD-10-CM | POA: Insufficient documentation

## 2016-12-12 DIAGNOSIS — K219 Gastro-esophageal reflux disease without esophagitis: Secondary | ICD-10-CM | POA: Diagnosis not present

## 2016-12-12 DIAGNOSIS — Z8249 Family history of ischemic heart disease and other diseases of the circulatory system: Secondary | ICD-10-CM | POA: Insufficient documentation

## 2016-12-12 DIAGNOSIS — Y9389 Activity, other specified: Secondary | ICD-10-CM | POA: Diagnosis not present

## 2016-12-12 DIAGNOSIS — S2242XA Multiple fractures of ribs, left side, initial encounter for closed fracture: Secondary | ICD-10-CM | POA: Diagnosis not present

## 2016-12-12 DIAGNOSIS — Z923 Personal history of irradiation: Secondary | ICD-10-CM | POA: Insufficient documentation

## 2016-12-12 DIAGNOSIS — I471 Supraventricular tachycardia: Secondary | ICD-10-CM | POA: Insufficient documentation

## 2016-12-12 DIAGNOSIS — S2239XA Fracture of one rib, unspecified side, initial encounter for closed fracture: Secondary | ICD-10-CM | POA: Diagnosis present

## 2016-12-12 DIAGNOSIS — I4719 Other supraventricular tachycardia: Secondary | ICD-10-CM

## 2016-12-12 DIAGNOSIS — I082 Rheumatic disorders of both aortic and tricuspid valves: Secondary | ICD-10-CM | POA: Insufficient documentation

## 2016-12-12 DIAGNOSIS — Y998 Other external cause status: Secondary | ICD-10-CM | POA: Insufficient documentation

## 2016-12-12 DIAGNOSIS — R0902 Hypoxemia: Secondary | ICD-10-CM

## 2016-12-12 DIAGNOSIS — Z85828 Personal history of other malignant neoplasm of skin: Secondary | ICD-10-CM | POA: Insufficient documentation

## 2016-12-12 LAB — COMPREHENSIVE METABOLIC PANEL
ALBUMIN: 3.3 g/dL — AB (ref 3.5–5.0)
ALK PHOS: 81 U/L (ref 38–126)
ALT: 19 U/L (ref 17–63)
ANION GAP: 7 (ref 5–15)
AST: 20 U/L (ref 15–41)
BUN: 30 mg/dL — ABNORMAL HIGH (ref 6–20)
CHLORIDE: 98 mmol/L — AB (ref 101–111)
CO2: 27 mmol/L (ref 22–32)
Calcium: 9.9 mg/dL (ref 8.9–10.3)
Creatinine, Ser: 0.89 mg/dL (ref 0.61–1.24)
GFR calc non Af Amer: >60 mL/min (ref >60)
Glucose, Bld: 115 mg/dL — ABNORMAL HIGH (ref 65–99)
Potassium: 3.4 mmol/L — ABNORMAL LOW (ref 3.5–5.1)
SODIUM: 132 mmol/L — AB (ref 135–145)
Total Bilirubin: 0.5 mg/dL (ref 0.3–1.2)
Total Protein: 6.3 g/dL — ABNORMAL LOW (ref 6.5–8.1)

## 2016-12-12 LAB — URINALYSIS, ROUTINE W REFLEX MICROSCOPIC
Bilirubin Urine: NEGATIVE
Glucose, UA: NEGATIVE mg/dL
Hgb urine dipstick: NEGATIVE
Ketones, ur: NEGATIVE mg/dL
LEUKOCYTES UA: NEGATIVE
NITRITE: NEGATIVE
PROTEIN: NEGATIVE mg/dL
Specific Gravity, Urine: 1.018 (ref 1.005–1.030)
pH: 7 (ref 5.0–8.0)

## 2016-12-12 LAB — CBC WITH DIFFERENTIAL/PLATELET
Basophils Absolute: 0 10*3/uL (ref 0.0–0.1)
Basophils Relative: 0 %
EOS ABS: 0 10*3/uL (ref 0.0–0.7)
EOS PCT: 1 %
HCT: 30.4 % — ABNORMAL LOW (ref 39.0–52.0)
HEMOGLOBIN: 9.9 g/dL — AB (ref 13.0–17.0)
LYMPHS ABS: 0.5 10*3/uL — AB (ref 0.7–4.0)
Lymphocytes Relative: 7 %
MCH: 29.4 pg (ref 26.0–34.0)
MCHC: 32.6 g/dL (ref 30.0–36.0)
MCV: 90.2 fL (ref 78.0–100.0)
Monocytes Absolute: 0.5 10*3/uL (ref 0.1–1.0)
Monocytes Relative: 7 %
NEUTROS PCT: 85 %
Neutro Abs: 6.1 10*3/uL (ref 1.7–7.7)
PLATELETS: 195 10*3/uL (ref 150–400)
RBC: 3.37 MIL/uL — AB (ref 4.22–5.81)
RDW: 20 % — ABNORMAL HIGH (ref 11.5–15.5)
WBC: 7.1 10*3/uL (ref 4.0–10.5)

## 2016-12-12 LAB — PROTIME-INR
INR: 1.08
Prothrombin Time: 14.1 seconds (ref 11.4–15.2)

## 2016-12-12 MED ORDER — METOPROLOL TARTRATE 5 MG/5ML IV SOLN
5.0000 mg | Freq: Once | INTRAVENOUS | Status: AC
  Administered 2016-12-12: 5 mg via INTRAVENOUS
  Filled 2016-12-12: qty 5

## 2016-12-12 MED ORDER — DILTIAZEM HCL 30 MG PO TABS
30.0000 mg | ORAL_TABLET | Freq: Three times a day (TID) | ORAL | Status: DC
  Administered 2016-12-12 – 2016-12-13 (×3): 30 mg via ORAL
  Filled 2016-12-12 (×3): qty 1

## 2016-12-12 MED ORDER — OXYCODONE HCL 5 MG PO TABS
5.0000 mg | ORAL_TABLET | Freq: Four times a day (QID) | ORAL | Status: DC | PRN
  Administered 2016-12-13 (×2): 5 mg via ORAL
  Filled 2016-12-12 (×2): qty 1

## 2016-12-12 MED ORDER — FERROUS SULFATE 325 (65 FE) MG PO TABS
650.0000 mg | ORAL_TABLET | ORAL | Status: DC
  Administered 2016-12-14: 650 mg via ORAL
  Filled 2016-12-12: qty 2

## 2016-12-12 MED ORDER — ADULT MULTIVITAMIN W/MINERALS CH
1.0000 | ORAL_TABLET | Freq: Every morning | ORAL | Status: DC
  Administered 2016-12-13 – 2016-12-14 (×2): 1 via ORAL
  Filled 2016-12-12 (×2): qty 1

## 2016-12-12 MED ORDER — POLYETHYLENE GLYCOL 3350 17 G PO PACK
17.0000 g | PACK | Freq: Every day | ORAL | Status: DC | PRN
  Administered 2016-12-13: 17 g via ORAL
  Filled 2016-12-12: qty 1

## 2016-12-12 MED ORDER — PANTOPRAZOLE SODIUM 40 MG PO TBEC
80.0000 mg | DELAYED_RELEASE_TABLET | Freq: Every day | ORAL | Status: DC
  Administered 2016-12-13 – 2016-12-14 (×2): 80 mg via ORAL
  Filled 2016-12-12 (×2): qty 2

## 2016-12-12 MED ORDER — ACETAMINOPHEN 650 MG RE SUPP: 650.0000 mg | Freq: Four times a day (QID) | RECTAL | Status: DC | PRN

## 2016-12-12 MED ORDER — POTASSIUM CHLORIDE CRYS ER 20 MEQ PO TBCR
20.0000 meq | EXTENDED_RELEASE_TABLET | Freq: Once | ORAL | Status: AC
  Administered 2016-12-12: 20 meq via ORAL
  Filled 2016-12-12: qty 1

## 2016-12-12 MED ORDER — ENOXAPARIN SODIUM 40 MG/0.4ML ~~LOC~~ SOLN
40.0000 mg | SUBCUTANEOUS | Status: DC
Start: 2016-12-12 — End: 2016-12-13
  Administered 2016-12-12: 40 mg via SUBCUTANEOUS
  Filled 2016-12-12: qty 0.4

## 2016-12-12 MED ORDER — VITAMIN D 1000 UNITS PO TABS
5000.0000 [IU] | ORAL_TABLET | Freq: Every morning | ORAL | Status: DC
  Administered 2016-12-13 – 2016-12-14 (×2): 5000 [IU] via ORAL
  Filled 2016-12-12 (×2): qty 5

## 2016-12-12 MED ORDER — FENTANYL CITRATE (PF) 100 MCG/2ML IJ SOLN
50.0000 ug | Freq: Once | INTRAMUSCULAR | Status: AC
  Administered 2016-12-12: 50 ug via INTRAVENOUS
  Filled 2016-12-12: qty 2

## 2016-12-12 MED ORDER — ONDANSETRON HCL 4 MG/2ML IJ SOLN: 4.0000 mg | Freq: Four times a day (QID) | INTRAMUSCULAR | Status: DC | PRN

## 2016-12-12 MED ORDER — TAMSULOSIN HCL 0.4 MG PO CAPS
0.4000 mg | ORAL_CAPSULE | Freq: Every day | ORAL | Status: DC
  Administered 2016-12-13 – 2016-12-14 (×2): 0.4 mg via ORAL
  Filled 2016-12-12 (×2): qty 1

## 2016-12-12 MED ORDER — ASPIRIN EC 81 MG PO TBEC
81.0000 mg | DELAYED_RELEASE_TABLET | Freq: Every morning | ORAL | Status: DC
  Administered 2016-12-13 – 2016-12-14 (×2): 81 mg via ORAL
  Filled 2016-12-12 (×2): qty 1

## 2016-12-12 MED ORDER — ENSURE ENLIVE PO LIQD
237.0000 mL | Freq: Two times a day (BID) | ORAL | Status: DC
  Administered 2016-12-13: 237 mL via ORAL

## 2016-12-12 MED ORDER — VITAMIN B-12 1000 MCG PO TABS
2500.0000 ug | ORAL_TABLET | Freq: Every morning | ORAL | Status: DC
  Administered 2016-12-13 – 2016-12-14 (×2): 2500 ug via ORAL
  Filled 2016-12-12 (×2): qty 3

## 2016-12-12 MED ORDER — SODIUM CHLORIDE 0.9 % IV BOLUS (SEPSIS)
500.0000 mL | Freq: Once | INTRAVENOUS | Status: AC
  Administered 2016-12-12: 500 mL via INTRAVENOUS

## 2016-12-12 MED ORDER — ACYCLOVIR 400 MG PO TABS
400.0000 mg | ORAL_TABLET | Freq: Every day | ORAL | Status: DC
  Administered 2016-12-12 – 2016-12-13 (×2): 400 mg via ORAL
  Filled 2016-12-12 (×2): qty 1

## 2016-12-12 MED ORDER — ACETAMINOPHEN 325 MG PO TABS: 650.0000 mg | ORAL_TABLET | Freq: Four times a day (QID) | ORAL | Status: DC | PRN

## 2016-12-12 MED ORDER — SODIUM CHLORIDE 0.9% FLUSH
3.0000 mL | Freq: Two times a day (BID) | INTRAVENOUS | Status: DC
  Administered 2016-12-12 – 2016-12-13 (×3): 3 mL via INTRAVENOUS

## 2016-12-12 MED ORDER — FENTANYL CITRATE (PF) 100 MCG/2ML IJ SOLN
25.0000 ug | Freq: Once | INTRAMUSCULAR | Status: AC
  Administered 2016-12-12: 25 ug via INTRAVENOUS
  Filled 2016-12-12: qty 2

## 2016-12-12 MED ORDER — DOCUSATE SODIUM 100 MG PO CAPS
100.0000 mg | ORAL_CAPSULE | Freq: Two times a day (BID) | ORAL | Status: DC
  Administered 2016-12-12 – 2016-12-14 (×4): 100 mg via ORAL
  Filled 2016-12-12 (×4): qty 1

## 2016-12-12 MED ORDER — TEMAZEPAM 7.5 MG PO CAPS
7.5000 mg | ORAL_CAPSULE | Freq: Once | ORAL | Status: AC
  Administered 2016-12-12: 7.5 mg via ORAL
  Filled 2016-12-12: qty 1

## 2016-12-12 MED ORDER — ONDANSETRON HCL 4 MG PO TABS: 4.0000 mg | ORAL_TABLET | Freq: Four times a day (QID) | ORAL | Status: DC | PRN

## 2016-12-12 MED ORDER — SODIUM CHLORIDE 0.9 % IV SOLN: INTRAVENOUS | Status: DC

## 2016-12-12 MED ORDER — IOPAMIDOL (ISOVUE-300) INJECTION 61%
INTRAVENOUS | Status: AC
  Administered 2016-12-12: 75 mL
  Filled 2016-12-12: qty 75

## 2016-12-12 MED ORDER — POTASSIUM CHLORIDE IN NACL 20-0.9 MEQ/L-% IV SOLN
INTRAVENOUS | Status: AC
  Administered 2016-12-12: 22:00:00 via INTRAVENOUS
  Filled 2016-12-12: qty 1000

## 2016-12-12 NOTE — Progress Notes (Signed)
Patient is A&Ox 3, came in as a result of fall. Present on admission. Pt has a skin tear to his right elbow and bruising to right hip/buttock.

## 2016-12-12 NOTE — Telephone Encounter (Signed)
Returned call to patient's daughter Rip Harbour.She was calling to get monitor results.Stated she had to cancel father's appointment this afternoon with Dr.Hochrein.Stated father fell in house this morning and he is presently at [facility] ED.Advised I will send message to [place] for monitor results.

## 2016-12-12 NOTE — ED Provider Notes (Signed)
[facility] DEPT Provider Note   CSN: [id number] Arrival date & time: 12/12/16  0947     History   Chief Complaint Chief Complaint  Patient presents with  . Fall  . Rib Injury    HPI Richard [name] is a 83 y.o. male.  HPI  Went to get up, felt lightheaded and fell down. No LOC.  Hit left ribs on the nightstand.  No other pain or injuries. No head trauma, no headache, no nausea or vomiting.  Has dyspnea with walking, notice when walking. Reports he has had this for about 6 months, not increasing or changed, not worse after fall. Son-in-law helped him get up.  Walked a little since it happened.  No arm pain/no hip pain/no numbness/weakness.  Pain with deep breaths over left ribs 8/10.  Reports typically has some orthostatic changes, lightheadedness with falls, no change today.  Patient with more frequent falls over last month. Concern for degree of dementia expressed by oncology notes and son in law.     Past Medical History:  Diagnosis Date  . Anemia    pt's daughter denies  . Arthritis   . Atrial fibrillation ([place])   . 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 Cataract    bil removed  . Cholelithiasis   . Dyslipidemia   . Dysphagia following unspecified cerebrovascular disease   . Esophageal stricture   . Essential hypertension, benign   . Facial basal cell cancer    nose  . GERD (gastroesophageal reflux disease)   . Hiatal hernia   . History of BPH   . History of radiation therapy 01/29/11 -03/15/11   larynx 6600 cGy 33 sessions  . Hyperbilirubinemia   . Hypertension   . Hypokalemia   . Hyponatremia   . Left displaced femoral neck fracture ([place])   . Multiple myeloma   . Pancytopenia ([place])   . Pneumonia   . Prostate cancer ([place]) 01/23/11   Gleason 3+3=6  . Pulmonary hypertension     Patient Active Problem List   Diagnosis Date Noted  . Rib fractures 12/12/2016  . Acute respiratory failure with hypoxia ([place]) 12/12/2016  . Atrial tachycardia ([place])  12/12/2016  . Gait instability 12/12/2016  . Hypoxia 12/12/2016  . Goals of care, counseling/discussion 12/06/2016  . Hypersensitivity reaction 12/06/2016  . Fall 12/06/2016  . BPH (benign prostatic hypertrophy) 06/15/2016  . Restless leg syndrome 06/15/2016  . Edema 06/13/2016  . Altered mental status   . Delirium   . Hyponatremia 06/03/2016  . Left displaced femoral neck fracture ([place]) 06/02/2016  . Hip fracture ([place]) 06/02/2016  . Hordeolum externum (stye) 05/28/2016  . Hypokalemia 05/28/2016  . Dehydration 05/28/2016  . Pancytopenia ([place]) 05/28/2016  . Hyperbilirubinemia 05/28/2016  . 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 Multiple myeloma ([place]) 06/23/2012  . Vocal cord cancer ([place]) 12/24/2011  . Prostate cancer ([place]) 12/24/2011  . Multiple myeloma ([name]) 12/23/2011  . Essential hypertension, benign 10/10/2010  . ESOPHAGEAL STRICTURE 08/28/2010  . CHOLELITHIASIS 08/28/2010  . DYSPHAGIA UNSPECIFIED 08/28/2010  . ABNORMAL FINDINGS GI TRACT 08/28/2010  . GERD 08/23/2010  . CHOLELITHIASIS 08/23/2010  . HYPERTENSION, PULMONARY 08/10/2010  . ATRIAL FIBRILLATION 08/10/2010  . MURMUR 08/10/2010    Past Surgical History:  Procedure Laterality Date  . APPENDECTOMY    . BACK SURGERY    . COLONOSCOPY    . ESOPHAGEAL DILATION    . HIP ARTHROPLASTY Left 06/03/2016   Procedure: ARTHROPLASTY BIPOLAR HIP  LEFT  (HEMIARTHROPLASTY);  Surgeon: Paralee Cancel, MD;  Location: [facility];  Service: Orthopedics;  Laterality: Left;  . HIP SURGERY    . right chest wall resection     of second ,third,and fourth rib  . TONSILLECTOMY    . trigeminal neuralgia    . UPPER GASTROINTESTINAL ENDOSCOPY         Home Medications    Prior to Admission medications   Medication Sig Start Date End Date Taking? Authorizing Provider  acetaminophen (TYLENOL) 325 MG tablet Take 2 tablets (650 mg total) by mouth every 6 (six) hours as needed for mild pain, fever or headache. 06/05/16  Yes Barton Dubois, MD    acyclovir (ZOVIRAX) 400 MG tablet Take 400 mg by mouth at bedtime.   Yes Historical Provider, MD  aspirin EC 81 MG tablet Take 81 mg by mouth every morning.   Yes Historical Provider, MD  Cholecalciferol 5000 UNITS TABS Take 5,000 Units by mouth every morning.    Yes Historical Provider, MD  Cyanocobalamin (B-12) 2500 MCG SUBL Take 2,500 mcg by mouth every morning.    Yes Historical Provider, MD  dexamethasone (DECADRON) 4 MG tablet TAKE 10 TABS BY MOUTH ONCE A WEEK Patient taking differently: TAKE 40 MG BY MOUTH ONCE A WEEK 10/14/16  Yes Ladell Pier, MD  docusate sodium (COLACE) 100 MG capsule Take 100 mg by mouth 2 (two) times daily.   Yes Historical Provider, MD  feeding supplement, ENSURE ENLIVE, (ENSURE ENLIVE) LIQD Take 237 mLs by mouth 2 (two) times daily between meals. 06/05/16  Yes Barton Dubois, MD  ferrous sulfate 325 (65 FE) MG tablet Take 650 mg by mouth every other day.   Yes Historical Provider, MD  losartan-hydrochlorothiazide (HYZAAR) 50-12.5 MG tablet Take 1 tablet by mouth every morning.    Yes Historical Provider, MD  Multiple Vitamin (MULTIVITAMIN WITH MINERALS) TABS tablet Take 1 tablet by mouth every morning.   Yes Historical Provider, MD  omeprazole (PRILOSEC) 40 MG capsule Take 40 mg by mouth daily before breakfast.  05/27/16  Yes Historical Provider, MD  polyethylene glycol (MIRALAX / GLYCOLAX) packet Take 17 g by mouth daily as needed for mild constipation or moderate constipation.    Yes Historical Provider, MD  rOPINIRole (REQUIP) 0.25 MG tablet Take 0.25-0.5 mg by mouth at bedtime as needed (RESTLESS LEG).    Yes Historical Provider, MD  Saw Palmetto 160 MG CAPS Take 320 mg by mouth every morning.    Yes Historical Provider, MD  tamsulosin (FLOMAX) 0.4 MG CAPS capsule Take 0.4 mg by mouth daily with breakfast.   Yes Historical Provider, MD  HYDROcodone-acetaminophen (NORCO/VICODIN) 5-325 MG tablet .5-1 tablet every 6 hours as needed Patient not taking: Reported on  12/06/2016 10/28/16   Ladell Pier, MD  ondansetron (ZOFRAN) 4 MG tablet Take 1 tablet (4 mg total) by mouth every 8 (eight) hours as needed for nausea or vomiting. Patient not taking: Reported on 12/12/2016 12/06/16   Ladell Pier, MD    Family History Family History  Problem Relation Age of Onset  . Heart attack Father     age 81  . Multiple myeloma Mother   . Mental illness Sister   . Colon cancer Neg Hx   . Esophageal cancer Neg Hx   . Pancreatic cancer Neg Hx   . Prostate cancer Neg Hx   . Rectal cancer Neg Hx   . Stomach cancer Neg Hx     Social History Social History  Substance Use Topics  . Smoking status: Former Smoker    Types: Cigarettes    Quit date: 12/09/1993  . Smokeless tobacco: Former Systems developer     Comment: reports that he quit smoking about 22 years ago  . Alcohol use No     Allergies   Celebrex [celecoxib] and Coricidin d cold-flu-sinus  [chlorphen-pe-acetaminophen]   Review of Systems Review of Systems  Constitutional: Negative for fever.  HENT: Negative for sore throat.   Eyes: Negative for visual disturbance.  Respiratory: Negative for cough and shortness of breath.   Cardiovascular: Positive for chest pain.  Gastrointestinal: Negative for abdominal pain, blood in stool, diarrhea, nausea and vomiting.  Genitourinary: Negative for difficulty urinating and dysuria.  Musculoskeletal: Negative for back pain and neck stiffness.  Skin: Negative for rash.  Neurological: Negative for syncope and headaches.     Physical Exam Updated Vital Signs BP 119/68   Pulse 98   Temp 97.9 F (36.6 C) (Oral)   Resp 17   SpO2 95%   Physical Exam  Constitutional: He appears well-developed and well-nourished. No distress.  HENT:  Head: Normocephalic and atraumatic.  Eyes: Conjunctivae and EOM are normal.  Neck: Normal range of motion.  Cardiovascular: Normal rate, regular rhythm, normal heart sounds and intact distal pulses.  Exam reveals no gallop and no  friction rub.   No murmur heard. Pulmonary/Chest: Effort normal and breath sounds normal. No respiratory distress. He has no wheezes. He has no rales. He exhibits tenderness.  Abdominal: Soft. He exhibits no distension. There is no tenderness. There is no guarding.  Musculoskeletal: He exhibits no edema.       Cervical back: He exhibits no tenderness and no bony tenderness.       Thoracic back: He exhibits no tenderness and no bony tenderness.       Lumbar back: He exhibits no tenderness and no bony tenderness.  Neurological: He is alert. He has normal strength. No sensory deficit.  Skin: Skin is warm and dry. He is not diaphoretic.  Nursing note and vitals reviewed.    ED Treatments / Results  Labs (all labs ordered are listed, but only abnormal results are displayed) Labs Reviewed  CBC WITH DIFFERENTIAL/PLATELET - Abnormal; Notable for the following:       Result Value   RBC 3.37 (*)    Hemoglobin 9.9 (*)    HCT 30.4 (*)    RDW 20.0 (*)    Lymphs Abs 0.5 (*)    All other components within normal limits  COMPREHENSIVE METABOLIC PANEL - Abnormal; Notable for the following:    Sodium 132 (*)    Potassium 3.4 (*)    Chloride 98 (*)    Glucose, Bld 115 (*)    BUN 30 (*)    Total Protein 6.3 (*)    Albumin 3.3 (*)    All other components within normal limits  URINALYSIS, ROUTINE W REFLEX MICROSCOPIC - Abnormal; Notable for the following:    APPearance HAZY (*)    All other components within normal limits  PROTIME-INR    EKG  EKG Interpretation  Date/Time:  Thursday December 12 2016 16:08:40 EST Ventricular Rate:  104 PR Interval:    QRS Duration: 139 QT Interval:  361 QTC Calculation: 475 R Axis:   -62 Text Interpretation:  Sinus tachycardia RBBB and LAFB Since prior ECG, rate has slowed, rhythm appears more likely sinus  Confirmed by [facility] MD, Fina Heizer ([id number]) on 12/12/2016 6:38:24 PM  Radiology Dg Ribs Unilateral W/chest Left  Result Date:  12/12/2016 CLINICAL DATA:  [place] [address] at home hitting the left lateral chest with pain, dizziness EXAM: LEFT RIBS AND CHEST - 3+ VIEW COMPARISON:  Chest x-ray of 06/02/2016 FINDINGS: Deformity of the right hemithorax appears be due to prior surgical intervention with several surgical sutures present overlying the right upper hemithorax. Otherwise the lungs are clear. No pneumonia or effusion is seen and no pneumothorax is noted. Left rib detail film do show acute fractures of the left anterior eighth and ninth ribs. IMPRESSION: 1. Fractures of the left anterior eighth and ninth ribs. 2. No active lung disease.  No pneumothorax. 3. Postsurgical changes are stable in the right upper hemithorax. Electronically Signed   By: Ivar Drape M.D.   On: 12/12/2016 10:55   Ct Chest W Contrast  Result Date: 12/12/2016 CLINICAL DATA:  Initial evaluation for acute trauma, fall, left-sided rib fractures. Evaluate for pneumothorax or occult splenic injury. EXAM: CT CHEST WITH CONTRAST TECHNIQUE: Multidetector CT imaging of the chest was performed during intravenous contrast administration. CONTRAST:  52m ISOVUE-300 IOPAMIDOL (ISOVUE-300) INJECTION 61% COMPARISON:  Prior radiograph from earlier the same day. FINDINGS: Cardiovascular: Intrathoracic aorta of normal caliber and appearance without acute abnormality. Scattered atheromatous plaque within the aortic arch and descending intrathoracic aorta. Visualized great vessels intact without acute abnormality. Atheromatous plaque noted at the origin of the great vessels. Heart size within normal limits. Diffuse 3 vessel coronary artery calcifications. Aortic valvular calcifications. No pericardial effusion. Pulmonary arteries grossly unremarkable. Mediastinum/Nodes: Thyroid within normal limits. No pathologically enlarged mediastinal, hilar, or axillary lymph nodes identified. Esophagus mildly patulous without acute abnormality. Small hiatal hernia noted. No mediastinal hematoma.  Lungs/Pleura: Scattered patchy opacity/consolidation within the dependent aspects of both lower lobes, which may reflect atelectasis and/ or infiltrates. No pulmonary edema or pleural effusion. No findings to suggest pulmonary contusion. No pneumothorax. No worrisome pulmonary nodule or mass. Upper Abdomen: No acute abnormality within the visualized upper abdomen. Visualized spleen is intact. Large calcified gallstone noted within the gallbladder. Musculoskeletal: There are acute minimally displaced fractures of the left anterolateral eighth, ninth, and tenth ribs. No other acute fracture identified. Remotely healed fractures of the left posterior tenth and eleventh ribs. Fractures of the right posterior tenth and eleventh ribs appear to be subacute to chronic in nature. Several right posterior ribs have been resected. Compression deformities involving the T2, T10, and T12 vertebral bodies appear to be chronic in nature. No worrisome lytic or blastic osseous lesions. IMPRESSION: 1. Acute minimally displaced fractures of the left anterolateral eighth, ninth, and tenth ribs. 2. No other acute traumatic injury within the chest. No pneumothorax or acute splenic injury identified. 3. Multiple additional remotely healed left and right-sided rib fractures as above, with additional remote compression deformities involving the T2, T10, and T12 vertebral bodies. 4. Patchy bibasilar airspace opacities/consolidation. While these findings may in part reflect atelectasis, possible infiltrate could be considered as well. 5. Cholelithiasis. 6. Atherosclerosis with diffuse 3 vessel coronary artery calcifications. Electronically Signed   By: BJeannine BogaM.D.   On: 12/12/2016 13:59    Procedures Procedures (including critical care time)  Medications Ordered in ED Medications  sodium chloride 0.9 % bolus 500 mL (0 mLs Intravenous Stopped 12/12/16 1430)  fentaNYL (SUBLIMAZE) injection 50 mcg (50 mcg Intravenous Given  12/12/16 1202)  iopamidol (ISOVUE-300) 61 % injection (75 mLs  Contrast Given 12/12/16 1324)  sodium chloride 0.9 % bolus 500 mL (0 mLs Intravenous Stopped 12/12/16 1654)  metoprolol (LOPRESSOR) injection 5 mg (5 mg Intravenous Given 12/12/16 1604)  fentaNYL (SUBLIMAZE) injection 25 mcg (25 mcg Intravenous Given 12/12/16 1654)     Initial Impression / Assessment and Plan / ED Course  I have reviewed the triage vital signs and the nursing notes.  Pertinent labs & imaging results that were available during my care of the patient were reviewed by me and considered in my medical decision making (see chart for details).  Clinical Course    Inguinal male who presents with concern with fall from standing with left chest wall pain. X-ray reveals rib fractures and no sign of pneumothorax. Patient denies any head trauma, is not on anticoagulation, platelets are currently within normal limits, has no headache, no nausea vomiting or signs of head trauma, nephro suspicion for intracranial bleed. Denies any other tenderness in his cervical thoracic, or lumbar lumbar spine. Denies any other areas of injury. Patient did have some tenderness just directly below his left ribs, and ordered a CT scan to evaluate for number for fractures, occult pneumothorax, or other upper abdominal injury. CT showed 3 rib fractures without signs of other trauma. There are areas that are consistent with atelectasis versus early pneumonia. Patient does not have cough, no leukocytosis, no fever, and have low suspicion clinically for pneumonia. Reports he typically has lightheadedness when he goes from sitting to standing and this was different. Has had multiple falls recently. Patient initially with normal heart rate on arrival to the ED, however developed tachycardia. Has been followed by cardiology for concern of possible atrial fibrillation versus multifocal atrial tachycardia. Initial EKG difficult to interpret, however concerning for possible  atrial tachycardia. She was given fluids for concern of mild dehydration, as well as metoprolol with some improvement in his heart rate.  Patient hypoxic likely secondary to splinting due to pain. He's been given fentanyl in the ED and incentive spirometer. Will admit to hospitalist service for continued care in setting of rib fractures requiring pulmonary toilet, hypoxia, tachycardia.  Final Clinical Impressions(s) / ED Diagnoses   Final diagnoses:  Closed fracture of multiple ribs of left side, initial encounter  Hypoxia  Tachycardia    New Prescriptions New Prescriptions   No medications on file     Gareth Morgan, MD 12/12/16 1842

## 2016-12-12 NOTE — ED Notes (Signed)
Bed: [id number] Expected date:  Expected time:  Means of arrival:  Comments: EMS- 83yo M, Fall/L rib pain

## 2016-12-12 NOTE — H&P (Signed)
History and Physical  JAQUAY POSTHUMUS NOM:[id number] DOB: 09/17/1933 DOA: 12/12/2016   PCP: Rachell Cipro, MD    Patient coming from: Home  Chief Complaint: fall with rib fracture  HPI:  ROBYN NOHR is a 83 y.o. male with medical history of multiple myeloma, multifocal atrial tachycardia, hypertension, GERD, and remote atrial fibrillation presented after a mechanical fall 2 on the morning of admission. The patient is a very poor historian due to cognitive impairment. History is supplemented by the patient's daughter at the bedside. According to the patient's daughter, the patient was getting out of bed at 5 AM on the morning of 12/12/2016 when he felt dizzy and had a mechanical fall. He got himself back into bed. His daughter checked up on him in the early morning, and the patient was able to get up and sit on the edge of bed albeit with some pain. Later in the morning, the patient got up again out of bed to take his medications and had another mechanical fall. He did not lose consciousness. The patient was not able to get up. When his daughter checked up on him later in the day, EMS was activated. The patient has a recurrent history of gait instability and falls. Since Christmas, the patient has had at least 4-5 falls. Notably, the patient was seen by his oncologist, Dr. Benay Spice, on 11/21/2016. At that time, the patient had an abnormal EKG. He was initially thought to be atrial fibrillation. He saw Dr. Percival Spanish on 11/22/2016 placed the patient on a 48 hours for monitor. Dr. Percival Spanish felt that the patient had MAT on his EKG.  the patient is currently not on any anticoagulation for his atrial fibrillation as he had only developed atrial fibrillation during acute illnesses. He last took Pradaxa approximately 4 years ago. The patient last received chemotherapy on 12/06/2016 when he hypersensitivity reaction to Daratumumab. It resolved with Benadryl and Pepcid, and Demerol.   In the emergency  department, the patient was afebrile and hemodynamically stable. He was initially tachycardic with heart rate into the 120s. EKG showed atrial tachycardia. The patient was given IV metoprolol with improvement of his heart rate and conversion back to sinus rhythm. However, the patient was noted to be hypoxemic with oxygen saturation 84% on room air. Chest x-ray showed fractures of the left anterior eighth and ninth ribs without any active lung disease. CT of the chest was obtained and confirmed minimally displaced left eighth, ninth, 10th ribs. There was bibasilar airspace opacity. Because of the patient's hypoxemia and tachycardia observation admission was requested.  Assessment/Plan:  hypoxemia  -Likely secondary to hypoventilation from the patient's pain due to his rib fractures  -I do feel the patient has pneumonia as he has normal WBC without dyspnea or fever  -will not start any antibiotics at this time  -Incentive spirometry  -Supplement oxygen to maintain saturation greater than 92%  -Presently stable on 2 L   MAT -I feel his EKG is consistent with MAT -Start diltiazem 30 mg every 8 hours -daughter requesting cardiology eval in am -Echocardiogram -TSH  Gait instability/mechanical falls -Orthostatic vital signs -UA and urine culture -PT evaluation -Hold losartan/HCTZ due to concerns for possible orthostasis -Discontinue ropinirole at this may cause confusion and gait instability -B12 level  Hyponatremia -Likely due to HCTZ -IV fluids for 12 hours  Multiple myeloma -Patient was due for chemotherapy 12/13/2016 -Please contact Dr. Benay Spice in am -Last chemotherapy 12/06/2016 -Dr. Benay Spice feels pt likely developing progression of  his disease  Hypokalemia -Replete -Check magnesium  Hypertension -Holding losartan/HCTZ as discussed above -Diltiazem as discussed above          Past Medical History:  Diagnosis Date  . Anemia    pt's daughter denies  . Arthritis     . Atrial fibrillation ([place])   . 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 Cataract    bil removed  . Cholelithiasis   . Dyslipidemia   . Dysphagia following unspecified cerebrovascular disease   . Esophageal stricture   . Essential hypertension, benign   . Facial basal cell cancer    nose  . GERD (gastroesophageal reflux disease)   . Hiatal hernia   . History of BPH   . History of radiation therapy 01/29/11 -03/15/11   larynx 6600 cGy 33 sessions  . Hyperbilirubinemia   . Hypertension   . Hypokalemia   . Hyponatremia   . Left displaced femoral neck fracture ([place])   . Multiple myeloma   . Pancytopenia ([place])   . Pneumonia   . Prostate cancer ([place]) 01/23/11   Gleason 3+3=6  . Pulmonary hypertension    Past Surgical History:  Procedure Laterality Date  . APPENDECTOMY    . BACK SURGERY    . COLONOSCOPY    . ESOPHAGEAL DILATION    . HIP ARTHROPLASTY Left 06/03/2016   Procedure: ARTHROPLASTY BIPOLAR HIP LEFT  (HEMIARTHROPLASTY);  Surgeon: Paralee Cancel, MD;  Location: [facility];  Service: Orthopedics;  Laterality: Left;  . HIP SURGERY    . right chest wall resection     of second ,third,and fourth rib  . TONSILLECTOMY    . trigeminal neuralgia    . UPPER GASTROINTESTINAL ENDOSCOPY     Social History:  reports that he quit smoking about 23 years ago. His smoking use included Cigarettes. He has quit using smokeless tobacco. He reports that he does not drink alcohol or use drugs.   Family History  Problem Relation Age of Onset  . Heart attack Father     age 23  . Multiple myeloma Mother   . Mental illness Sister   . Colon cancer Neg Hx   . Esophageal cancer Neg Hx   . Pancreatic cancer Neg Hx   . Prostate cancer Neg Hx   . Rectal cancer Neg Hx   . Stomach cancer Neg Hx      Allergies  Allergen Reactions  . Celebrex [Celecoxib] Other (See Comments)    GI bleeding  . Coricidin D Cold-Flu-Sinus  [Chlorphen-Pe-Acetaminophen] Hives     Prior to Admission  medications   Medication Sig Start Date End Date Taking? Authorizing Provider  acetaminophen (TYLENOL) 325 MG tablet Take 2 tablets (650 mg total) by mouth every 6 (six) hours as needed for mild pain, fever or headache. 06/05/16  Yes Barton Dubois, MD  acyclovir (ZOVIRAX) 400 MG tablet Take 400 mg by mouth at bedtime.   Yes Historical Provider, MD  aspirin EC 81 MG tablet Take 81 mg by mouth every morning.   Yes Historical Provider, MD  Cholecalciferol 5000 UNITS TABS Take 5,000 Units by mouth every morning.    Yes Historical Provider, MD  Cyanocobalamin (B-12) 2500 MCG SUBL Take 2,500 mcg by mouth every morning.    Yes Historical Provider, MD  dexamethasone (DECADRON) 4 MG tablet TAKE 10 TABS BY MOUTH ONCE A WEEK Patient taking differently: TAKE 40 MG BY MOUTH ONCE A WEEK 10/14/16  Yes Ladell Pier, MD  docusate  sodium (COLACE) 100 MG capsule Take 100 mg by mouth 2 (two) times daily.   Yes Historical Provider, MD  feeding supplement, ENSURE ENLIVE, (ENSURE ENLIVE) LIQD Take 237 mLs by mouth 2 (two) times daily between meals. 06/05/16  Yes Barton Dubois, MD  ferrous sulfate 325 (65 FE) MG tablet Take 650 mg by mouth every other day.   Yes Historical Provider, MD  losartan-hydrochlorothiazide (HYZAAR) 50-12.5 MG tablet Take 1 tablet by mouth every morning.    Yes Historical Provider, MD  Multiple Vitamin (MULTIVITAMIN WITH MINERALS) TABS tablet Take 1 tablet by mouth every morning.   Yes Historical Provider, MD  omeprazole (PRILOSEC) 40 MG capsule Take 40 mg by mouth daily before breakfast.  05/27/16  Yes Historical Provider, MD  polyethylene glycol (MIRALAX / GLYCOLAX) packet Take 17 g by mouth daily as needed for mild constipation or moderate constipation.    Yes Historical Provider, MD  rOPINIRole (REQUIP) 0.25 MG tablet Take 0.25-0.5 mg by mouth at bedtime as needed (RESTLESS LEG).    Yes Historical Provider, MD  Saw Palmetto 160 MG CAPS Take 320 mg by mouth every morning.    Yes Historical  Provider, MD  tamsulosin (FLOMAX) 0.4 MG CAPS capsule Take 0.4 mg by mouth daily with breakfast.   Yes Historical Provider, MD  HYDROcodone-acetaminophen (NORCO/VICODIN) 5-325 MG tablet .5-1 tablet every 6 hours as needed Patient not taking: Reported on 12/06/2016 10/28/16   Ladell Pier, MD  ondansetron (ZOFRAN) 4 MG tablet Take 1 tablet (4 mg total) by mouth every 8 (eight) hours as needed for nausea or vomiting. Patient not taking: Reported on 12/12/2016 12/06/16   Ladell Pier, MD    Review of Systems:  Constitutional:  No weight loss, night sweats, Fevers, chills, fatigue.  Head&Eyes: No headache.  No vision loss.  No eye pain or scotoma ENT:  No Difficulty swallowing,Tooth/dental problems,Sore throat,  No ear ache, post nasal drip,  Cardio-vascular:  No Orthopnea, PND, swelling in lower extremities,  dizziness, palpitations  GI:  No  abdominal pain, nausea, vomiting, diarrhea, loss of appetite, hematochezia, melena, heartburn, indigestion, Resp:  No shortness of breath with exertion or at rest. No cough. No coughing up of blood .No wheezing.No chest wall deformity  Skin:  no rash or lesions.  GU:  no dysuria, change in color of urine, no urgency or frequency. No flank pain.  Musculoskeletal:  No joint pain or swelling. No decreased range of motion. No back pain.  Psych:  No change in mood or affect. No depression or anxiety. Neurologic: No headache, no dysesthesia, no focal weakness, no vision loss. No syncope  Physical Exam: Vitals:   12/12/16 1339 12/12/16 1341 12/12/16 1445 12/12/16 1646  BP:   164/67 119/68  Pulse:   (!) 126 98  Resp:   (!) 31 17  Temp:      TempSrc:      SpO2: (!) 84% 90% 94% 95%   General:  A&O x 3, NAD, nontoxic, pleasant/cooperative Head/Eye: No conjunctival hemorrhage, no icterus, [place]/AT, No nystagmus ENT:  No icterus,  No thrush, good dentition, no pharyngeal exudate Neck:  No masses, no lymphadenpathy, no bruits CV:  RRR, no rub, no  gallop, no S3 Lung:  Poor inspiratory effort. Bibasilar rales., good air movement, no wheeze, no rhonchi Abdomen: soft/NT, +BS, nondistended, no peritoneal signs Ext: No cyanosis, No rashes, No petechiae, No lymphangitis, No edema Neuro: CNII-XII intact, strength 4/5 in bilateral upper and lower extremities, no dysmetria  Labs on  Admission:  Basic Metabolic Panel:  Recent Labs Lab 12/06/16 0812 12/12/16 1151  NA 133* 132*  K 4.0 3.4*  CL  --  98*  CO2 25 27  GLUCOSE 99 115*  BUN 19.2 30*  CREATININE 0.9 0.89  CALCIUM 10.1 9.9   Liver Function Tests:  Recent Labs Lab 12/06/16 0812 12/12/16 1151  AST 12 20  ALT 16 19  ALKPHOS 87 81  BILITOT 0.64 0.5  PROT 6.1* 6.3*  ALBUMIN 2.6* 3.3*   No results for input(s): LIPASE, AMYLASE in the last 168 hours. No results for input(s): AMMONIA in the last 168 hours. CBC:  Recent Labs Lab 12/06/16 0812 12/12/16 1151  WBC 8.6 7.1  NEUTROABS 6.8* 6.1  HGB 9.0* 9.9*  HCT 26.3* 30.4*  MCV 90.4 90.2  PLT 255 195   Coagulation Profile:  Recent Labs Lab 12/12/16 1151  INR 1.08   Cardiac Enzymes: No results for input(s): CKTOTAL, CKMB, CKMBINDEX, TROPONINI in the last 168 hours. BNP: Invalid input(s): POCBNP CBG: No results for input(s): GLUCAP in the last 168 hours. Urine analysis:    Component Value Date/Time   COLORURINE YELLOW 12/12/2016 1151   APPEARANCEUR HAZY (A) 12/12/2016 1151   LABSPEC 1.018 12/12/2016 1151   PHURINE 7.0 12/12/2016 1151   GLUCOSEU NEGATIVE 12/12/2016 1151   HGBUR NEGATIVE 12/12/2016 1151   BILIRUBINUR NEGATIVE 12/12/2016 1151   KETONESUR NEGATIVE 12/12/2016 1151   PROTEINUR NEGATIVE 12/12/2016 1151   UROBILINOGEN 1.0 07/26/2010 1230   NITRITE NEGATIVE 12/12/2016 1151   LEUKOCYTESUR NEGATIVE 12/12/2016 1151   Sepsis Labs: @LABRCNTIP (procalcitonin:4,lacticidven:4) ) Recent Results (from the past 240 hour(s))  TECHNOLOGIST REVIEW     Status: None   Collection Time: 12/06/16  8:12 AM    Result Value Ref Range Status   Technologist Review   Final    Metas and Myelocytes present, Rare Nrbc, sl poly, few ovalos and teardrop     Radiological Exams on Admission: Dg Ribs Unilateral W/chest Left  Result Date: 12/12/2016 CLINICAL DATA:  [place] at home hitting the left lateral chest with pain, dizziness EXAM: LEFT RIBS AND CHEST - 3+ VIEW COMPARISON:  Chest x-ray of 06/02/2016 FINDINGS: Deformity of the right hemithorax appears be due to prior surgical intervention with several surgical sutures present overlying the right upper hemithorax. Otherwise the lungs are clear. No pneumonia or effusion is seen and no pneumothorax is noted. Left rib detail film do show acute fractures of the left anterior eighth and ninth ribs. IMPRESSION: 1. Fractures of the left anterior eighth and ninth ribs. 2. No active lung disease.  No pneumothorax. 3. Postsurgical changes are stable in the right upper hemithorax. Electronically Signed   By: Ivar Drape M.D.   On: 12/12/2016 10:55   Ct Chest W Contrast  Result Date: 12/12/2016 CLINICAL DATA:  Initial evaluation for acute trauma, fall, left-sided rib fractures. Evaluate for pneumothorax or occult splenic injury. EXAM: CT CHEST WITH CONTRAST TECHNIQUE: Multidetector CT imaging of the chest was performed during intravenous contrast administration. CONTRAST:  11m ISOVUE-300 IOPAMIDOL (ISOVUE-300) INJECTION 61% COMPARISON:  Prior radiograph from earlier the same day. FINDINGS: Cardiovascular: Intrathoracic aorta of normal caliber and appearance without acute abnormality. Scattered atheromatous plaque within the aortic arch and descending intrathoracic aorta. Visualized great vessels intact without acute abnormality. Atheromatous plaque noted at the origin of the great vessels. Heart size within normal limits. Diffuse 3 vessel coronary artery calcifications. Aortic valvular calcifications. No pericardial effusion. Pulmonary arteries grossly unremarkable.  Mediastinum/Nodes: Thyroid within normal limits.  No pathologically enlarged mediastinal, hilar, or axillary lymph nodes identified. Esophagus mildly patulous without acute abnormality. Small hiatal hernia noted. No mediastinal hematoma. Lungs/Pleura: Scattered patchy opacity/consolidation within the dependent aspects of both lower lobes, which may reflect atelectasis and/ or infiltrates. No pulmonary edema or pleural effusion. No findings to suggest pulmonary contusion. No pneumothorax. No worrisome pulmonary nodule or mass. Upper Abdomen: No acute abnormality within the visualized upper abdomen. Visualized spleen is intact. Large calcified gallstone noted within the gallbladder. Musculoskeletal: There are acute minimally displaced fractures of the left anterolateral eighth, ninth, and tenth ribs. No other acute fracture identified. Remotely healed fractures of the left posterior tenth and eleventh ribs. Fractures of the right posterior tenth and eleventh ribs appear to be subacute to chronic in nature. Several right posterior ribs have been resected. Compression deformities involving the T2, T10, and T12 vertebral bodies appear to be chronic in nature. No worrisome lytic or blastic osseous lesions. IMPRESSION: 1. Acute minimally displaced fractures of the left anterolateral eighth, ninth, and tenth ribs. 2. No other acute traumatic injury within the chest. No pneumothorax or acute splenic injury identified. 3. Multiple additional remotely healed left and right-sided rib fractures as above, with additional remote compression deformities involving the T2, T10, and T12 vertebral bodies. 4. Patchy bibasilar airspace opacities/consolidation. While these findings may in part reflect atelectasis, possible infiltrate could be considered as well. 5. Cholelithiasis. 6. Atherosclerosis with diffuse 3 vessel coronary artery calcifications. Electronically Signed   By: Jeannine Boga M.D.   On: 12/12/2016 13:59    EKG:  Independently reviewed. Atrial tachycardia, RBBB    Time spent:60 minutes Code Status:   FULL Family Communication:  Daughter updated at bedside Disposition Plan: expect 1-2 day hospitalization Consults called: none DVT Prophylaxis: [place] Lovenox/Heparin   SCDs  Towana Stenglein, DO  [facility] Pager [phone]  If 7PM-7AM, please contact night-coverage [email or web address] Password [facility] 12/12/2016, 6:15 PM

## 2016-12-12 NOTE — ED Triage Notes (Signed)
Patient here from home with complaints of fall this morning hitting left rib cage on the side of dresser. Tender upon palpation. Pain 7/10.

## 2016-12-12 NOTE — Telephone Encounter (Signed)
New message ° ° ° ° ° °Calling to get monitor results °

## 2016-12-12 NOTE — ED Notes (Signed)
[name] O2 decreased to 84%.  Pt placed on 2L [place].

## 2016-12-13 ENCOUNTER — Ambulatory Visit: Payer: Medicare Other

## 2016-12-13 ENCOUNTER — Observation Stay (HOSPITAL_COMMUNITY): Payer: Medicare Other

## 2016-12-13 ENCOUNTER — Observation Stay (HOSPITAL_BASED_OUTPATIENT_CLINIC_OR_DEPARTMENT_OTHER): Payer: Medicare Other

## 2016-12-13 DIAGNOSIS — R2681 Unsteadiness on feet: Secondary | ICD-10-CM | POA: Diagnosis not present

## 2016-12-13 DIAGNOSIS — I4891 Unspecified atrial fibrillation: Secondary | ICD-10-CM | POA: Diagnosis not present

## 2016-12-13 DIAGNOSIS — S2242XA Multiple fractures of ribs, left side, initial encounter for closed fracture: Secondary | ICD-10-CM | POA: Diagnosis not present

## 2016-12-13 DIAGNOSIS — E876 Hypokalemia: Secondary | ICD-10-CM | POA: Diagnosis not present

## 2016-12-13 DIAGNOSIS — I10 Essential (primary) hypertension: Secondary | ICD-10-CM | POA: Diagnosis not present

## 2016-12-13 DIAGNOSIS — R431 Parosmia: Secondary | ICD-10-CM | POA: Diagnosis not present

## 2016-12-13 DIAGNOSIS — I471 Supraventricular tachycardia: Secondary | ICD-10-CM | POA: Diagnosis not present

## 2016-12-13 LAB — BASIC METABOLIC PANEL
ANION GAP: 6 (ref 5–15)
BUN: 32 mg/dL — AB (ref 6–20)
CALCIUM: 9.2 mg/dL (ref 8.9–10.3)
CO2: 26 mmol/L (ref 22–32)
Chloride: 102 mmol/L (ref 101–111)
Creatinine, Ser: 0.88 mg/dL (ref 0.61–1.24)
GFR calc Af Amer: >60 mL/min (ref >60)
GFR calc non Af Amer: >60 mL/min (ref >60)
Glucose, Bld: 115 mg/dL — ABNORMAL HIGH (ref 65–99)
Potassium: 3.9 mmol/L (ref 3.5–5.1)
Sodium: 134 mmol/L — ABNORMAL LOW (ref 135–145)

## 2016-12-13 LAB — CBC
HEMATOCRIT: 24.4 % — AB (ref 39.0–52.0)
HEMOGLOBIN: 8.1 g/dL — AB (ref 13.0–17.0)
MCH: 30.6 pg (ref 26.0–34.0)
MCHC: 33.2 g/dL (ref 30.0–36.0)
MCV: 92.1 fL (ref 78.0–100.0)
Platelets: 149 10*3/uL — ABNORMAL LOW (ref 150–400)
RBC: 2.65 MIL/uL — ABNORMAL LOW (ref 4.22–5.81)
RDW: 20.5 % — ABNORMAL HIGH (ref 11.5–15.5)
WBC: 8.4 10*3/uL (ref 4.0–10.5)

## 2016-12-13 LAB — ECHOCARDIOGRAM COMPLETE: Height: 68 in

## 2016-12-13 LAB — VITAMIN B12: VITAMIN B 12: 4573 pg/mL — AB (ref 180–914)

## 2016-12-13 LAB — MAGNESIUM: Magnesium: 1.8 mg/dL (ref 1.7–2.4)

## 2016-12-13 LAB — TSH: TSH: 2.091 u[IU]/mL (ref 0.350–4.500)

## 2016-12-13 MED ORDER — METOPROLOL TARTRATE 25 MG PO TABS
25.0000 mg | ORAL_TABLET | Freq: Two times a day (BID) | ORAL | Status: DC
  Administered 2016-12-13 – 2016-12-14 (×3): 25 mg via ORAL
  Filled 2016-12-13 (×3): qty 1

## 2016-12-13 MED ORDER — ENSURE ENLIVE PO LIQD
237.0000 mL | Freq: Three times a day (TID) | ORAL | Status: DC
  Administered 2016-12-13 – 2016-12-14 (×3): 237 mL via ORAL

## 2016-12-13 NOTE — Progress Notes (Signed)
  Echocardiogram 2D Echocardiogram has been performed.  Richard [name] 12/13/2016, 1:58 PM

## 2016-12-13 NOTE — Telephone Encounter (Signed)
Follow up      Daughter is calling back wanting monitor results.  Please call and let her know if we have the results.

## 2016-12-13 NOTE — Progress Notes (Signed)
Spoke to [employer] on phone, informed about ABN-voiced understanding, left copy in rm.MD notified.

## 2016-12-13 NOTE — Clinical Social Work Placement (Signed)
CSW received consult from [facility], Juliann Pulse that PT recommended SNF at discharge. CSW confirmed with patient/daughter, Rip Harbour (ph#: [phone]) that patient is agreeable to have information send for bed offers. CSW completed FL2 and sent information out to [facility] and submitted clinicals to [facility] for SNF authorization.    Raynaldo Opitz, [facility] Clinical Social Worker cell #: [phone]    CLINICAL SOCIAL WORK PLACEMENT  NOTE  Date:  12/13/2016  Patient Details  Name: Richard [name] MRN: [id number] Date of Birth: 1933-11-19  Clinical Social Work is seeking post-discharge placement for this patient at the [place] level of care (*CSW will initial, date and re-position this form in  chart as items are completed):  Yes   Patient/family provided with [place] Work Department's list of facilities offering this level of care within the geographic area requested by the patient (or if unable, by the patient's family).  Yes   Patient/family informed of their freedom to choose among providers that offer the needed level of care, that participate in Medicare, Medicaid or managed care program needed by the patient, have an available bed and are willing to accept the patient.  Yes   Patient/family informed of [facility]'s ownership interest in [facility] and [facility], as well as of the fact that they are under no obligation to receive care at these facilities.  PASRR submitted to EDS on 12/13/16     PASRR number received on 12/13/16     Existing PASRR number confirmed on       FL2 transmitted to all facilities in geographic area requested by pt/family on 12/13/16     FL2 transmitted to all facilities within larger geographic area on       Patient informed that his/her managed care company has contracts with or will negotiate with certain facilities, including the following:            Patient/family  informed of bed offers received.  Patient chooses bed at       Physician recommends and patient chooses bed at      Patient to be transferred to   on  .  Patient to be transferred to facility by       Patient family notified on   of transfer.  Name of family member notified:        PHYSICIAN       Additional Comment:    _______________________________________________ Standley Brooking, LCSW 12/13/2016, 3:36 PM

## 2016-12-13 NOTE — Care Management Note (Signed)
Case Management Note  Patient Details  Name: Richard [name] MRN: [id number] Date of Birth: March 25, 1933  Subjective/Objective:  MD notified of potential of Advanced Beneficiary Notice to be given today. Also notified CSW-exploring SNF.                  Action/Plan:d/c plan SNF.   Expected Discharge Date:   (unknown)               Expected Discharge Plan:  Skilled Nursing Facility  In-House Referral:  Clinical Social Work  Discharge planning Services  CM Consult  Post Acute Care Choice:    Choice offered to:     DME Arranged:    DME Agency:     HH Arranged:    [place] Agency:     Status of Service:  In process, will continue to follow  If discussed at Long Length of Stay Meetings, dates discussed:    Additional Comments:  Dessa Phi, RN 12/13/2016, 1:51 PM

## 2016-12-13 NOTE — Evaluation (Signed)
Physical Therapy Evaluation Patient Details Name: Richard [name] MRN: [id number] DOB: 05-Oct-1933 Today's Date: 12/13/2016   History of Present Illness  Richard [name] is a 83 y.o. male with medical history of multiple myeloma, multifocal atrial tachycardia, hypertension, GERD, L bipolar hip d/t fx and remote atrial fibrillation presented after a mechanical fall 2 on the morning of admission, resulting in rib fx's  Clinical Impression  Pt admitted with above diagnosis. Pt currently with functional limitations due to the deficits listed below (see PT Problem List).  Pt will benefit from skilled PT to increase their independence and safety with mobility to allow discharge to the venue listed below.   Pt is very deconditioned,at risk for continued falls, recommend SNF; will continue to follow     Follow Up Recommendations SNF;Supervision/Assistance - 24 hour    Equipment Recommendations  None recommended by PT    Recommendations for Other Services       Precautions / Restrictions Precautions Precautions: Fall Precaution Comments: monitor O2 Restrictions Weight Bearing Restrictions: No      Mobility  Bed Mobility Overal bed mobility: Needs Assistance Bed Mobility: Supine to Sit     Supine to sit: +2 for physical assistance;Min assist     General bed mobility comments: assist with trunk to upright position; incr time to perform task,cues to self assist and position safely in sitting  Transfers Overall transfer level: Needs assistance Equipment used: Rolling walker (2 wheeled) Transfers: Sit to/from [facility] Sit to Stand: Min assist Stand pivot transfers: Min assist       General transfer comment: cues for hand placement, control descent and overall safe technique, assist to maneuver RW  Ambulation/Gait         Gait velocity: orthostatic BPs checked, see flowsheets, pt was not dizzy, BPs did not indicate orthostasis   General Gait Details: pt felt  he was in too much pain to attempt amb; pivotal steps/steps fwd and back to chair  Stairs            Wheelchair Mobility    Modified Rankin (Stroke Patients Only)       Balance Overall balance assessment: Needs assistance Sitting-balance support: Feet supported;Single extremity supported Sitting balance-Leahy Scale: Fair   Postural control: Right lateral lean Standing balance support: During functional activity;Bilateral upper extremity supported Standing balance-Leahy Scale: Poor                               Pertinent Vitals/Pain Pain Assessment: Faces Faces Pain Scale: Hurts even more Pain Location: L side/ribs Pain Intervention(s): Limited activity within patient's tolerance;Monitored during session;Premedicated before session;Repositioned    Home Living Family/patient expects to be discharged to:: Unsure Living Arrangements: Alone               Additional Comments: daughter lives nearby per patient    Prior Function Level of Independence: Needs assistance   Gait / Transfers Assistance Needed: amb household distances with RW;   ADL's / Homemaking Assistance Needed: showers when dtr is there to supervise        Hand Dominance        Extremity/Trunk Assessment   Upper Extremity Assessment Upper Extremity Assessment: Defer to OT evaluation;Generalized weakness    Lower Extremity Assessment Lower Extremity Assessment: Generalized weakness       Communication   Communication: No difficulties  Cognition Arousal/Alertness: Awake/alert Behavior During Therapy: Flat affect Overall Cognitive Status: History of cognitive  impairments - at baseline Area of Impairment: Problem solving       Following Commands: Follows one step commands with increased time;Follows multi-step commands inconsistently     Problem Solving: Decreased initiation;Slow processing;Requires verbal cues;Requires tactile cues      General Comments       Exercises     Assessment/Plan    PT Assessment Patient needs continued PT services  PT Problem List Decreased strength;Decreased range of motion;Decreased activity tolerance;Decreased balance;Decreased mobility;Pain          PT Treatment Interventions DME instruction;Gait training;Functional mobility training;Therapeutic exercise;Therapeutic activities;Patient/family education;Balance training    PT Goals (Current goals can be found in the Care Plan section)  Acute Rehab PT Goals Patient Stated Goal: less pain when moving PT Goal Formulation: With patient Time For Goal Achievement: 12/27/16 Potential to Achieve Goals: Good    Frequency Min 3X/week   Barriers to discharge        Co-evaluation               End of Session Equipment Utilized During Treatment: Gait belt Activity Tolerance: Patient limited by fatigue;Patient limited by pain Patient left: in chair;with call bell/phone within reach;with chair alarm set Nurse Communication: Mobility status    Functional Assessment Tool Used: clinical judgement Functional Limitation: Mobility: Walking and moving around Mobility: Walking and Moving Around Current Status [phone]): At least 40 percent but less than 60 percent impaired, limited or restricted Mobility: Walking and Moving Around Goal Status [phone]): At least 1 percent but less than 20 percent impaired, limited or restricted    Time: 1212-1238 PT Time Calculation (min) (ACUTE ONLY): 26 min   Charges:   PT Evaluation $PT Eval Moderate Complexity: 1 Procedure PT Treatments $Therapeutic Activity: 8-22 mins   PT G Codes:   PT G-Codes **NOT FOR INPATIENT CLASS** Functional Assessment Tool Used: clinical judgement Functional Limitation: Mobility: Walking and moving around Mobility: Walking and Moving Around Current Status (B1694): At least 40 percent but less than 60 percent impaired, limited or restricted Mobility: Walking and Moving Around Goal Status [phone]):  At least 1 percent but less than 20 percent impaired, limited or restricted    [facility] 12/13/2016, 12:59 PM

## 2016-12-13 NOTE — Telephone Encounter (Signed)
Attempt to return call-no answer, lmtcb. 

## 2016-12-13 NOTE — Care Management Note (Signed)
Case Management Note  Patient Details  Name: Richard [name] MRN: [id number] Date of Birth: 10-Jun-1933  Subjective/Objective:  PT recc SNF-CSW already following for [facility].                  Action/Plan:d/c plan SNF.   Expected Discharge Date:   (unknown)               Expected Discharge Plan:  Skilled Nursing Facility  In-House Referral:  Clinical Social Work  Discharge planning Services  CM Consult  Post Acute Care Choice:    Choice offered to:     DME Arranged:    DME Agency:     HH Arranged:    [place] Agency:     Status of Service:  In process, will continue to follow  If discussed at Long Length of Stay Meetings, dates discussed:    Additional Comments:  Dessa Phi, RN 12/13/2016, 2:31 PM

## 2016-12-13 NOTE — Care Management Note (Signed)
Case Management Note  Patient Details  Name: Richard [name] MRN: [id number] Date of Birth: 1933-07-27  Subjective/Objective:83 y/o m admitted w/rib fractures.From home. Per patient request spoke to dtr Sandria Senter tel#[phone] observation notice-dtr states "she has already contacted his insurance, & the insurance will not kick him out, it's up to the doctor". She will not be in town tomorrow throughtout the [employer] her kids back to school in [place]. No one will be @ home to take care of the patient. She wants to speed up this process while she is here today. I have paged the attending. PT cons placed.CSW also notified.                    Action/Plan:d/c plan home.   Expected Discharge Date:   (unknown)               Expected Discharge Plan:  [place]  In-House Referral:  Clinical Social Work  Discharge planning Services  CM Consult  Post Acute Care Choice:    Choice offered to:     DME Arranged:    DME Agency:     HH Arranged:    [place] Agency:     Status of Service:  In process, will continue to follow  If discussed at Long Length of Stay Meetings, dates discussed:    Additional Comments:  Dessa Phi, RN 12/13/2016, 11:46 AM

## 2016-12-13 NOTE — Care Management Obs Status (Signed)
MEDICARE OBSERVATION STATUS NOTIFICATION   Patient Details  Name: BRAEDEN ADDICKS MRN: [id number] Date of Birth: February 22, 1933   Medicare Observation Status Notification Given:  Yes    MahabirJuliann Pulse, RN 12/13/2016, 11:56 AM

## 2016-12-13 NOTE — Clinical Social Work Placement (Signed)
Patient is set to discharge to [facility] tomorrow. Patient & daughter, Rip Harbour made aware.   Weekend CSW to facilitate discharge once discharge summary is completed.     Raynaldo Opitz, [facility] Clinical Social Worker cell #: [phone]    CLINICAL SOCIAL WORK PLACEMENT  NOTE  Date:  12/13/2016  Patient Details  Name: [name] MRN: [id number] Date of Birth: 1932/12/12  Clinical Social Work is seeking post-discharge placement for this patient at the [place] level of care (*CSW will initial, date and re-position this form in  chart as items are completed):  Yes   Patient/family provided with [facility] Work Department's list of facilities offering this level of care within the geographic area requested by the patient (or if unable, by the patient's family).  Yes   Patient/family informed of their freedom to choose among providers that offer the needed level of care, that participate in Medicare, Medicaid or managed care program needed by the patient, have an available bed and are willing to accept the patient.  Yes   Patient/family informed of [facility]'s ownership interest in [facility] and [facility], as well as of the fact that they are under no obligation to receive care at these facilities.  PASRR submitted to EDS on 12/13/16     PASRR number received on 12/13/16     Existing PASRR number confirmed on       FL2 transmitted to all facilities in geographic area requested by pt/family on 12/13/16     FL2 transmitted to all facilities within larger geographic area on       Patient informed that his/her managed care company has contracts with or will negotiate with certain facilities, including the following:        Yes   Patient/family informed of bed offers received.  Patient chooses bed at [facility]     Physician recommends and patient chooses bed at      Patient to be  transferred to [facility] on 12/14/16.  Patient to be transferred to facility by       Patient family notified on   of transfer.  Name of family member notified:        PHYSICIAN       Additional Comment:    _______________________________________________ Standley Brooking, LCSW 12/13/2016, 4:41 PM

## 2016-12-13 NOTE — NC FL2 (Signed)
[place] MEDICAID FL2 LEVEL OF CARE SCREENING TOOL     IDENTIFICATION  Patient Name: Richard [name] Birthdate: March 08, 1933 Sex: male Admission Date (Current Location): 12/12/2016  [facility] [facility] [place] Number:  Herbalist and Address:  [facility],  [place], [place]      Provider Number: [id number]  Attending Physician Name and Address:  Donne Hazel, MD  Relative Name and Phone Number:       Current Level of Care: Hospital Recommended Level of Care: [place] Prior Approval Number:    Date Approved/Denied:   PASRR Number: [id number]  Discharge Plan: SNF    Current Diagnoses: Patient Active Problem List   Diagnosis Date Noted  . Rib fractures 12/12/2016  . Acute respiratory failure with hypoxia ([place]) 12/12/2016  . Atrial tachycardia ([place]) 12/12/2016  . Gait instability 12/12/2016  . Hypoxia 12/12/2016  . Goals of care, counseling/discussion 12/06/2016  . Hypersensitivity reaction 12/06/2016  . Fall 12/06/2016  . BPH (benign prostatic hypertrophy) 06/15/2016  . Restless leg syndrome 06/15/2016  . Edema 06/13/2016  . Altered mental status   . Delirium   . Hyponatremia 06/03/2016  . Left displaced femoral neck fracture ([place]) 06/02/2016  . Hip fracture ([place]) 06/02/2016  . Hordeolum externum (stye) 05/28/2016  . Hypokalemia 05/28/2016  . Dehydration 05/28/2016  . Pancytopenia ([place]) 05/28/2016  . Hyperbilirubinemia 05/28/2016  . 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 Multiple myeloma ([place]) 06/23/2012  . 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 Prostate cancer ([place]) 12/24/2011  . Multiple myeloma ([place]) 12/23/2011  . Essential hypertension, benign 10/10/2010  . ESOPHAGEAL STRICTURE 08/28/2010  . CHOLELITHIASIS 08/28/2010  . DYSPHAGIA UNSPECIFIED 08/28/2010  . ABNORMAL FINDINGS GI TRACT 08/28/2010  . GERD 08/23/2010  . CHOLELITHIASIS 08/23/2010  . HYPERTENSION, PULMONARY 08/10/2010  . ATRIAL FIBRILLATION  08/10/2010  . MURMUR 08/10/2010    Orientation RESPIRATION BLADDER Height & Weight     Self, Time, Situation, Place  O2 (2.5L) Continent Weight:   Height:  5' 8"  (172.7 cm)  BEHAVIORAL SYMPTOMS/MOOD NEUROLOGICAL BOWEL NUTRITION STATUS      Continent Diet (Heart)  AMBULATORY STATUS COMMUNICATION OF NEEDS Skin   Extensive Assist Verbally Normal                       Personal Care Assistance Level of Assistance  Bathing, Dressing Bathing Assistance: Limited assistance   Dressing Assistance: Limited assistance     Functional Limitations Info             SPECIAL CARE FACTORS FREQUENCY  PT (By licensed PT), OT (By licensed OT)     PT Frequency: 5 OT Frequency: 5            Contractures      Additional Factors Info  Code Status, Allergies Code Status Info: Fullcode Allergies Info: Celebrex Celecoxib, Coricidin D Cold-flu-sinus  Chlorphen-pe-acetaminophen           Current Medications (12/13/2016):  This is the current hospital active medication list Current Facility-Administered Medications  Medication Dose Route Frequency Provider Last Rate Last Dose  . acetaminophen (TYLENOL) tablet 650 mg  650 mg Oral Q6H PRN Orson Eva, MD       Or  . acetaminophen (TYLENOL) suppository 650 mg  650 mg Rectal Q6H PRN Orson Eva, MD      . acyclovir (ZOVIRAX) tablet 400 mg  400 mg Oral QHS Orson Eva, MD   400 mg at  12/12/16 2205  . aspirin EC tablet 81 mg  81 mg Oral q morning - 10a Orson Eva, MD   81 mg at 12/13/16 1112  . cholecalciferol (VITAMIN D) tablet 5,000 Units  5,000 Units Oral q morning - 10a Orson Eva, MD   5,000 Units at 12/13/16 1111  . diltiazem (CARDIZEM) tablet 30 mg  30 mg Oral Q8H Orson Eva, MD   30 mg at 12/13/16 0532  . docusate sodium (COLACE) capsule 100 mg  100 mg Oral BID Orson Eva, MD   100 mg at 12/13/16 1113  . feeding supplement (ENSURE ENLIVE) (ENSURE ENLIVE) liquid 237 mL  237 mL Oral TID BM Donne Hazel, MD      . Derrill Memo ON 12/14/2016] ferrous  sulfate tablet 650 mg  650 mg Oral Roberto Scales, MD      . multivitamin with minerals tablet 1 tablet  1 tablet Oral q morning - 10a Orson Eva, MD   1 tablet at 12/13/16 1112  . ondansetron (ZOFRAN) tablet 4 mg  4 mg Oral Q6H PRN Orson Eva, MD       Or  . ondansetron [facility]) injection 4 mg  4 mg Intravenous Q6H PRN Orson Eva, MD      . oxyCODONE (Oxy IR/ROXICODONE) immediate release tablet 5 mg  5 mg Oral Q6H PRN Lily Kocher, MD   5 mg at 12/13/16 1156  . pantoprazole (PROTONIX) EC tablet 80 mg  80 mg Oral Daily Orson Eva, MD   80 mg at 12/13/16 1113  . polyethylene glycol (MIRALAX / GLYCOLAX) packet 17 g  17 g Oral Daily PRN Orson Eva, MD   17 g at 12/13/16 1156  . sodium chloride flush (NS) 0.9 % injection 3 mL  3 mL Intravenous Q12H Orson Eva, MD   3 mL at 12/13/16 1000  . tamsulosin (FLOMAX) capsule 0.4 mg  0.4 mg Oral Q breakfast Orson Eva, MD   0.4 mg at 12/13/16 1111  . vitamin B-12 (CYANOCOBALAMIN) tablet 2,500 mcg  2,500 mcg Oral q morning - 10a Orson Eva, MD   2,500 mcg at 12/13/16 1110     Discharge Medications: Please see discharge summary for a list of discharge medications.  Relevant Imaging Results:  Relevant Lab Results:   Additional Information ss 3 [id number]  Standley Brooking, LCSW

## 2016-12-13 NOTE — Progress Notes (Signed)
PROGRESS NOTE    Richard [name]  KWI:[id number] DOB: 1933-07-26 DOA: 12/12/2016 PCP: Rachell Cipro, MD    Brief Narrative:  83 y.o. male with medical history of multiple myeloma, multifocal atrial tachycardia, hypertension, GERD, and remote atrial fibrillation presented after a mechanical fall 2 on the morning of admission. The patient is a very poor historian due to cognitive impairment. History is supplemented by the patient's daughter at the bedside. According to the patient's daughter, the patient was getting out of bed at 5 AM on the morning of 12/12/2016 when he felt dizzy and had a mechanical fall. He got himself back into bed. His daughter checked up on him in the early morning, and the patient was able to get up and sit on the edge of bed albeit with some pain. Later in the morning, the patient got up again out of bed to take his medications and had another mechanical fall. He did not lose consciousness. The patient was not able to get up. When his daughter checked up on him later in the day, EMS was activated. The patient has a recurrent history of gait instability and falls. Since Christmas, the patient has had at least 4-5 falls. Notably, the patient was seen by his oncologist, Dr. Benay Spice, on 11/21/2016. At that time, the patient had an abnormal EKG. He was initially thought to be atrial fibrillation. He saw Dr. Percival Spanish on 11/22/2016 placed the patient on a 48 hours for monitor. Dr. Percival Spanish felt that the patient had MAT on his EKG.  the patient is currently not on any anticoagulation for his atrial fibrillation as he had only developed atrial fibrillation during acute illnesses. He last took Pradaxa approximately 4 years ago. The patient last received chemotherapy on 12/06/2016 when he hypersensitivity reaction to Daratumumab. It resolved with Benadryl and Pepcid, and Demerol.   In the emergency department, the patient was afebrile and hemodynamically stable. He was initially  tachycardic with heart rate into the 120s. EKG showed atrial tachycardia. The patient was given IV metoprolol with improvement of his heart rate and conversion back to sinus rhythm. However, the patient was noted to be hypoxemic with oxygen saturation 84% on room air. Chest x-ray showed fractures of the left anterior eighth and ninth ribs without any active lung disease. CT of the chest was obtained and confirmed minimally displaced left eighth, ninth, 10th ribs. There was bibasilar airspace opacity. Because of the patient's hypoxemia and tachycardia observation admission was requested.  Assessment & Plan:   Active Problems:   Essential hypertension, benign   ATRIAL FIBRILLATION   Multiple myeloma (HCC)   Hypokalemia   Hyponatremia   Rib fractures   Acute respiratory failure with hypoxia (HCC)   Atrial tachycardia (HCC)   Gait instability   Hypoxia    Hypoxemia  -Likely secondary to hypoventilation from the patient's pain due to his rib fractures  -no leukocytosis and no fevers. No abx at this time  -Continue incentive spirometry  -Supplement oxygen to maintain saturation greater than 92%  -Presently stable on 2.5 L, wean O2 as tolerated   Atrial tachycardia -Started diltiazem 30 mg every 8 hours -Echocardiogram ordered -TSH within normal limits -Followed by Dr. Percival Spanish as outpatient. S/p recent event monitor with findings of NSR, atrial tach, frequent PAC's - Reviewed EKG with Cardiology. EKG with sinus tach - Cardiology recommends transition from cardizem to beta blocker. Follow up appt with Dr. Percival Spanish arranged  Gait instability/mechanical falls -Orthostatic vital signs neg -UA unremarkable -PT consulted with recs  for SNF -Held losartan/HCTZ due to concerns for possible orthostasis -Discontinued ropinirole at this may cause confusion and gait instability -B12 level obtained  Hyponatremia -Likely due to HCTZ -IV fluids were continued for 12  hours -improved  Multiple myeloma -Patient was due for chemotherapy 12/13/2016 -Last chemotherapy 12/06/2016  -Dr. Benay Spice feels pt likely developing progression of his disease  Hypokalemia -Replaced -Mg stable  Hypertension -Held losartan/HCTZ as discussed above -Will add metoprolol per above  DVT prophylaxis: SCD's Code Status: Full Family Communication: Pt in room, family not at bedside Disposition Plan: D/c in 24hrs likely  Consultants:   Discussed case with Cardiology  Notified Oncology of patient's admission  Procedures:   2d echo 1/5  Antimicrobials: Anti-infectives    Start     Dose/Rate Route Frequency Ordered Stop   12/12/16 2200  acyclovir (ZOVIRAX) tablet 400 mg     400 mg Oral Daily at bedtime 12/12/16 1953         Subjective: Pleasant, without complaints  Objective: Vitals:   12/13/16 0519 12/13/16 1100 12/13/16 1150 12/13/16 1248  BP: 126/69  109/62   Pulse: 82  98   Resp: 18  18   Temp: 98.2 F (36.8 C)  98 F (36.7 C)   TempSrc: Oral  Oral   SpO2: 99%  98% 95%  Height:  5' 8"  (1.727 m)      Intake/Output Summary (Last 24 hours) at 12/13/16 1431 Last data filed at 12/13/16 0925  Gross per 24 hour  Intake              835 ml  Output              300 ml  Net              535 ml   There were no vitals filed for this visit.  Examination:  General exam: Appears calm and comfortable  Respiratory system: Clear to auscultation. Respiratory effort normal. Cardiovascular system: S1 & S2 heard, RRR.  Gastrointestinal system: Abdomen is nondistended, soft and nontender. No organomegaly or masses felt. Normal bowel sounds heard. Central nervous system: Alert and oriented. No focal neurological deficits. Extremities: Symmetric 5 x 5 power. Skin: No rashes, lesions Psychiatry: Judgement and insight appear normal. Mood & affect appropriate.   Data Reviewed: I have personally reviewed following labs and imaging studies  CBC:  Recent  Labs Lab 12/12/16 1151 12/13/16 0534  WBC 7.1 8.4  NEUTROABS 6.1  --   HGB 9.9* 8.1*  HCT 30.4* 24.4*  MCV 90.2 92.1  PLT 195 696*   Basic Metabolic Panel:  Recent Labs Lab 12/12/16 1151 12/13/16 0534  NA 132* 134*  K 3.4* 3.9  CL 98* 102  CO2 27 26  GLUCOSE 115* 115*  BUN 30* 32*  CREATININE 0.89 0.88  CALCIUM 9.9 9.2  MG  --  1.8   GFR: Estimated Creatinine Clearance: 63.7 mL/min (by C-G formula based on SCr of 0.88 mg/dL). Liver Function Tests:  Recent Labs Lab 12/12/16 1151  AST 20  ALT 19  ALKPHOS 81  BILITOT 0.5  PROT 6.3*  ALBUMIN 3.3*   No results for input(s): LIPASE, AMYLASE in the last 168 hours. No results for input(s): AMMONIA in the last 168 hours. Coagulation Profile:  Recent Labs Lab 12/12/16 1151  INR 1.08   Cardiac Enzymes: No results for input(s): CKTOTAL, CKMB, CKMBINDEX, TROPONINI in the last 168 hours. BNP (last 3 results) No results for input(s): PROBNP in the last 8760  hours. HbA1C: No results for input(s): HGBA1C in the last 72 hours. CBG: No results for input(s): GLUCAP in the last 168 hours. Lipid Profile: No results for input(s): CHOL, HDL, LDLCALC, TRIG, CHOLHDL, LDLDIRECT in the last 72 hours. Thyroid Function Tests:  Recent Labs  12/13/16 0534  TSH 2.091   Anemia Panel:  Recent Labs  12/13/16 0534  VITAMINB12 4,573*   Sepsis Labs: No results for input(s): PROCALCITON, LATICACIDVEN in the last 168 hours.  Recent Results (from the past 240 hour(s))  TECHNOLOGIST REVIEW     Status: None   Collection Time: 12/06/16  8:12 AM  Result Value Ref Range Status   Technologist Review   Final    Metas and Myelocytes present, Rare Nrbc, sl poly, few ovalos and teardrop     Radiology Studies: Dg Ribs Unilateral W/chest Left  Result Date: 12/12/2016 CLINICAL DATA:  [place] [address] at home hitting the left lateral chest with pain, dizziness EXAM: LEFT RIBS AND CHEST - 3+ VIEW COMPARISON:  Chest x-ray of 06/02/2016 FINDINGS:  Deformity of the right hemithorax appears be due to prior surgical intervention with several surgical sutures present overlying the right upper hemithorax. Otherwise the lungs are clear. No pneumonia or effusion is seen and no pneumothorax is noted. Left rib detail film do show acute fractures of the left anterior eighth and ninth ribs. IMPRESSION: 1. Fractures of the left anterior eighth and ninth ribs. 2. No active lung disease.  No pneumothorax. 3. Postsurgical changes are stable in the right upper hemithorax. Electronically Signed   By: Ivar Drape M.D.   On: 12/12/2016 10:55   Ct Chest W Contrast  Result Date: 12/12/2016 CLINICAL DATA:  Initial evaluation for acute trauma, fall, left-sided rib fractures. Evaluate for pneumothorax or occult splenic injury. EXAM: CT CHEST WITH CONTRAST TECHNIQUE: Multidetector CT imaging of the chest was performed during intravenous contrast administration. CONTRAST:  17m ISOVUE-300 IOPAMIDOL (ISOVUE-300) INJECTION 61% COMPARISON:  Prior radiograph from earlier the same day. FINDINGS: Cardiovascular: Intrathoracic aorta of normal caliber and appearance without acute abnormality. Scattered atheromatous plaque within the aortic arch and descending intrathoracic aorta. Visualized great vessels intact without acute abnormality. Atheromatous plaque noted at the origin of the great vessels. Heart size within normal limits. Diffuse 3 vessel coronary artery calcifications. Aortic valvular calcifications. No pericardial effusion. Pulmonary arteries grossly unremarkable. Mediastinum/Nodes: Thyroid within normal limits. No pathologically enlarged mediastinal, hilar, or axillary lymph nodes identified. Esophagus mildly patulous without acute abnormality. Small hiatal hernia noted. No mediastinal hematoma. Lungs/Pleura: Scattered patchy opacity/consolidation within the dependent aspects of both lower lobes, which may reflect atelectasis and/ or infiltrates. No pulmonary edema or pleural  effusion. No findings to suggest pulmonary contusion. No pneumothorax. No worrisome pulmonary nodule or mass. Upper Abdomen: No acute abnormality within the visualized upper abdomen. Visualized spleen is intact. Large calcified gallstone noted within the gallbladder. Musculoskeletal: There are acute minimally displaced fractures of the left anterolateral eighth, ninth, and tenth ribs. No other acute fracture identified. Remotely healed fractures of the left posterior tenth and eleventh ribs. Fractures of the right posterior tenth and eleventh ribs appear to be subacute to chronic in nature. Several right posterior ribs have been resected. Compression deformities involving the T2, T10, and T12 vertebral bodies appear to be chronic in nature. No worrisome lytic or blastic osseous lesions. IMPRESSION: 1. Acute minimally displaced fractures of the left anterolateral eighth, ninth, and tenth ribs. 2. No other acute traumatic injury within the chest. No pneumothorax or acute splenic injury identified. 3.  Multiple additional remotely healed left and right-sided rib fractures as above, with additional remote compression deformities involving the T2, T10, and T12 vertebral bodies. 4. Patchy bibasilar airspace opacities/consolidation. While these findings may in part reflect atelectasis, possible infiltrate could be considered as well. 5. Cholelithiasis. 6. Atherosclerosis with diffuse 3 vessel coronary artery calcifications. Electronically Signed   By: Jeannine Boga M.D.   On: 12/12/2016 13:59   Dg Chest Port 1 View  Result Date: 12/13/2016 CLINICAL DATA:  83 year old male with shortness of breath. Fall with multiple left rib fractures 4 days ago. Initial encounter. EXAM: PORTABLE CHEST 1 VIEW COMPARISON:  CT chest 12/12/2016 and earlier. FINDINGS: Portable AP semi upright view at at 0942 hours. Increased confluent left infrahilar and medial lung base opacity since the radiographs yesterday, in the region of patchy  distal airway and peribronchial opacity seen by CT. At the same time the right lung base remains clear radiographically. No pneumothorax. No definite pleural effusion. Chronic postoperative changes to the upper right ribs. Stable cardiac size and mediastinal contours. Mildly displaced left lower rib fracture re- demonstrated. IMPRESSION: 1. Worsening opacity at the left lung base, suspicious for pneumonia. No definite pleural effusion. 2. Patchy right lung base opacity seen by CT is not evident radiographically. 3. Mildly displaced left lower rib fracture. Electronically Signed   By: Genevie Ann M.D.   On: 12/13/2016 10:04    Scheduled Meds: . acyclovir  400 mg Oral QHS  . aspirin EC  81 mg Oral q morning - 10a  . cholecalciferol  5,000 Units Oral q morning - 10a  . diltiazem  30 mg Oral Q8H  . docusate sodium  100 mg Oral BID  . feeding supplement (ENSURE ENLIVE)  237 mL Oral TID BM  . [START ON 12/14/2016] ferrous sulfate  650 mg Oral QODAY  . multivitamin with minerals  1 tablet Oral q morning - 10a  . pantoprazole  80 mg Oral Daily  . sodium chloride flush  3 mL Intravenous Q12H  . tamsulosin  0.4 mg Oral Q breakfast  . vitamin B-12  2,500 mcg Oral q morning - 10a   Continuous Infusions:   LOS: 0 days   Hondo Nanda, Orpah Melter, MD [facility] Pager [phone]  If 7PM-7AM, please contact night-coverage [email or web address] Password [email or web address] 12/13/2016, 2:31 PM

## 2016-12-13 NOTE — Progress Notes (Signed)
Nutrition Brief Note  Patient identified during rounds d/t report of decreased appetite.   Wt Readings from Last 15 Encounters:  12/06/16 163 lb (73.9 kg)  11/22/16 166 lb 9.6 oz (75.6 kg)  11/21/16 168 lb 1.6 oz (76.2 kg)  10/28/16 168 lb 3.2 oz (76.3 kg)  09/26/16 181 lb 4.8 oz (82.2 kg)  09/12/16 177 lb 12.8 oz (80.6 kg)  08/19/16 166 lb 3.2 oz (75.4 kg)  07/22/16 164 lb 1.6 oz (74.4 kg)  07/03/16 167 lb 8 oz (76 kg)  06/25/16 161 lb 9.6 oz (73.3 kg)  06/24/16 160 lb 1.6 oz (72.6 kg)  06/18/16 160 lb 12.8 oz (72.9 kg)  06/13/16 176 lb 6.4 oz (80 kg)  06/10/16 176 lb 4.8 oz (80 kg)  06/07/16 176 lb 3.2 oz (79.9 kg)    Per chart review, pt is 5' 8"  (172.7 cm). BMI is 24.8 kg/m2. Patient meets criteria for normal weight/borderline overweight based on current BMI.   Current diet order is Heart Healthy and pt reports 50% completion of breakfast this AM. He states that PTA he would often eat 3 meals/day. Breakfast might be an egg biscuit sandwich, lunch was a peanut butter and jelly sandwich, and he liked having spaghetti for dinner. He would also drink Ensure with each meal. Ensure currently ordered BID but will increase to TID with meals; RN alerted to this.   Pt with hx of multiple myeloma on chemo (last: 12/06/16) and he denies any taste alterations. He denies any chewing or swallowing issues or abdominal pain or nausea with intakes.   Labs and medications reviewed.   If further nutrition issues arise, please consult RD.     Jarome Matin, MS, RD, LDN, [facility] Inpatient Clinical Dietitian Pager # [phone] After hours/weekend pager # [phone]

## 2016-12-13 NOTE — Progress Notes (Signed)
TC dtr Sandria Senter [phone], no answer to inform about Advanced Beneficiary Notice(ABN) left my call back tel# [phone]. Will leave copy of ABN in patient's room. MD/CSW notified.

## 2016-12-14 DIAGNOSIS — I10 Essential (primary) hypertension: Secondary | ICD-10-CM | POA: Diagnosis not present

## 2016-12-14 DIAGNOSIS — E871 Hypo-osmolality and hyponatremia: Secondary | ICD-10-CM

## 2016-12-14 DIAGNOSIS — E876 Hypokalemia: Secondary | ICD-10-CM | POA: Diagnosis not present

## 2016-12-14 DIAGNOSIS — I471 Supraventricular tachycardia: Secondary | ICD-10-CM | POA: Diagnosis not present

## 2016-12-14 DIAGNOSIS — I4891 Unspecified atrial fibrillation: Secondary | ICD-10-CM | POA: Diagnosis not present

## 2016-12-14 LAB — CBC
HCT: 25.4 % — ABNORMAL LOW (ref 39.0–52.0)
Hemoglobin: 8.4 g/dL — ABNORMAL LOW (ref 13.0–17.0)
MCH: 30.7 pg (ref 26.0–34.0)
MCHC: 33.1 g/dL (ref 30.0–36.0)
MCV: 92.7 fL (ref 78.0–100.0)
PLATELETS: 180 10*3/uL (ref 150–400)
RBC: 2.74 MIL/uL — ABNORMAL LOW (ref 4.22–5.81)
RDW: 20.1 % — ABNORMAL HIGH (ref 11.5–15.5)
WBC: 6.4 10*3/uL (ref 4.0–10.5)

## 2016-12-14 LAB — BASIC METABOLIC PANEL
Anion gap: 9 (ref 5–15)
BUN: 29 mg/dL — AB (ref 6–20)
CHLORIDE: 99 mmol/L — AB (ref 101–111)
CO2: 26 mmol/L (ref 22–32)
CREATININE: 0.81 mg/dL (ref 0.61–1.24)
Calcium: 9.4 mg/dL (ref 8.9–10.3)
GFR calc Af Amer: >60 mL/min (ref >60)
GLUCOSE: 121 mg/dL — AB (ref 65–99)
Potassium: 4 mmol/L (ref 3.5–5.1)
SODIUM: 134 mmol/L — AB (ref 135–145)

## 2016-12-14 MED ORDER — METOPROLOL TARTRATE 25 MG PO TABS: 25.0000 mg | ORAL_TABLET | Freq: Two times a day (BID) | ORAL | 0 refills | Status: AC

## 2016-12-14 MED ORDER — OXYCODONE HCL 5 MG PO TABS: 5.0000 mg | ORAL_TABLET | Freq: Four times a day (QID) | ORAL | 0 refills | Status: AC | PRN

## 2016-12-14 NOTE — Discharge Summary (Signed)
Physician Discharge Summary  MAT STUARD BMW:[id number] DOB: 1933/09/08 DOA: 12/12/2016  PCP: Rachell Cipro, MD  Admit date: 12/12/2016 Discharge date: 12/14/2016  Admitted From: Home Disposition:  SNF  Recommendations for Outpatient Follow-up:  1. Follow up with PCP in 2-3 weeks 2. Follow up with Cardiology as scheduled 3. Follow up with Dr. Benay Spice as scheduled 4. Please monitor blood pressure. Would resume losartan-HCTZ if blood pressure tolerates  Discharge Condition:Stable CODE STATUS:Full Diet recommendation: heart healthy  [place] controlled substance registry reviewed. Last narcotics prescribed 7/17 for 5 days course.    Brief/Interim Summary: 83 y.o.malewith medical history of multiple myeloma, multifocal atrial tachycardia, hypertension, GERD, and remote atrial fibrillation presented after a mechanical fall 2 on the morning of admission. The patient is a very poor historian due to cognitive impairment. History is supplemented by the patient's daughter at the bedside. According to the patient's daughter, the patient was getting out of bed at 5 AM on the morning of 12/12/2016 when he felt dizzy and had a mechanical fall. He got himself back into bed. His daughter checked up on him in the early morning, and the patient was able to get up and sit on the edge of bed albeit with some pain. Later in the morning, the patient got up again out of bed to take his medications and had another mechanical fall. He did not lose consciousness. The patient was not able to get up. When his daughter checked up on him later in the day, EMS was activated. The patient has a recurrent history of gait instability and falls. Since Christmas, the patient has had at least 4-5 falls. Notably, the patient was seen by his oncologist, Dr. Benay Spice, on 11/21/2016. At that time, the patient had an abnormal EKG. He was initially thought to be atrial fibrillation. He saw Dr. Percival Spanish on 11/22/2016 placed the patient on a  48 hours for monitor. Dr. Azucena Fallen that the patient had MAT on his EKG. the patient is currently not on any anticoagulation for his atrial fibrillation as he had only developed atrial fibrillation during acute illnesses. He last took Pradaxaapproximately 4 years ago. The patient last received chemotherapy on 12/06/2016 when he hypersensitivity reaction to Daratumumab. It resolved with Benadryl and Pepcid, and Demerol.   In the emergency department, the patient was afebrile and hemodynamically stable. He was initially tachycardic with heart rate into the 120s. EKG showed atrial tachycardia. The patient was given IV metoprolol with improvement of his heart rate and conversion back to sinus rhythm. However, the patient was noted to be hypoxemic with oxygen saturation 84% on room air. Chest x-ray showed fractures of the left anterior eighth and ninth ribs without any active lung disease. CT of the chest was obtained and confirmed minimally displaced left eighth, ninth, 10th ribs. There was bibasilar airspace opacity. Because of the patient's hypoxemia and tachycardia observation admission was requested  Hypoxemia  -Likely secondary to hypoventilation from the patient's pain due to his rib fractures  -no leukocytosis and no fevers. Remained well without antibiotics -Continued incentive spirometry  -Patient remained stable on minimal O2 support   Atrial tachycardia -Initially started diltiazem 30 mg every 8 hours -Echocardiogram demonstrated normal LVEF with mild aortic stenosis -TSH within normal limits -Followed by Dr. Percival Spanish as outpatient. S/p recent event monitor with findings of NSR, atrial tach, frequent PAC's - Reviewed EKG with Cardiology. EKG with sinus tach - Cardiology recommends transition from cardizem to beta blocker. Follow up appt with Dr. Percival Spanish arranged  Gait instability/mechanical  falls -Orthostatic vital signs neg -UA unremarkable -PT consulted with recs for  SNF -Held losartan/HCTZ. BP remained stable -Discontinued ropinirole at this may cause confusion and gait instability  Hyponatremia -Likely due to HCTZ -IV fluids were continued for 12 hours -improved  Multiple myeloma -Patient was due for chemotherapy 12/13/2016 -Last chemotherapy 12/06/2016  -Dr. Benay Spice feels pt likely developing progression of his disease  Hypokalemia -Replaced -Mg stable  Hypertension -Held losartan/HCTZ as discussed above -Added metoprolol per above - Would resume losartan/hctz if blood pressure tolerates  Discharge Diagnoses:  Active Problems:   Essential hypertension, benign   ATRIAL FIBRILLATION   Multiple myeloma (HCC)   Hypokalemia   Hyponatremia   Rib fractures   Acute respiratory failure with hypoxia (HCC)   Atrial tachycardia (HCC)   Gait instability   Hypoxia   Multiple closed fractures of ribs of left side    Discharge Instructions   Allergies as of 12/14/2016      Reactions   Celebrex [celecoxib] Other (See Comments)   GI bleeding   Coricidin D Cold-flu-sinus  [chlorphen-pe-acetaminophen] Hives      Medication List    STOP taking these medications   HYDROcodone-acetaminophen 5-325 MG tablet Commonly known as:  NORCO/VICODIN   losartan-hydrochlorothiazide 50-12.5 MG tablet Commonly known as:  HYZAAR     TAKE these medications   acetaminophen 325 MG tablet Commonly known as:  TYLENOL Take 2 tablets (650 mg total) by mouth every 6 (six) hours as needed for mild pain, fever or headache.   acyclovir 400 MG tablet Commonly known as:  ZOVIRAX Take 400 mg by mouth at bedtime.   aspirin EC 81 MG tablet Take 81 mg by mouth every morning.   B-12 2500 MCG Subl Take 2,500 mcg by mouth every morning.   Cholecalciferol 5000 units Tabs Take 5,000 Units by mouth every morning.   dexamethasone 4 MG tablet Commonly known as:  DECADRON TAKE 10 TABS BY MOUTH ONCE A WEEK What changed:  See the new instructions.    docusate sodium 100 MG capsule Commonly known as:  COLACE Take 100 mg by mouth 2 (two) times daily.   feeding supplement (ENSURE ENLIVE) Liqd Take 237 mLs by mouth 2 (two) times daily between meals.   ferrous sulfate 325 (65 FE) MG tablet Take 650 mg by mouth every other day.   metoprolol tartrate 25 MG tablet Commonly known as:  LOPRESSOR Take 1 tablet (25 mg total) by mouth 2 (two) times daily.   multivitamin with minerals Tabs tablet Take 1 tablet by mouth every morning.   omeprazole 40 MG capsule Commonly known as:  PRILOSEC Take 40 mg by mouth daily before breakfast.   ondansetron 4 MG tablet Commonly known as:  ZOFRAN Take 1 tablet (4 mg total) by mouth every 8 (eight) hours as needed for nausea or vomiting.   oxyCODONE 5 MG immediate release tablet Commonly known as:  Oxy IR/ROXICODONE Take 1 tablet (5 mg total) by mouth every 6 (six) hours as needed for moderate pain.   polyethylene glycol packet Commonly known as:  MIRALAX / GLYCOLAX Take 17 g by mouth daily as needed for mild constipation or moderate constipation.   rOPINIRole 0.25 MG tablet Commonly known as:  REQUIP Take 0.25-0.5 mg by mouth at bedtime as needed (RESTLESS LEG).   Saw Palmetto 160 MG Caps Take 320 mg by mouth every morning.   tamsulosin 0.4 MG Caps capsule Commonly known as:  FLOMAX Take 0.4 mg by mouth daily with breakfast.  Follow-up Information    Minus Breeding, MD Follow up on 01/02/2017.   Specialty:  Cardiology Why:  [facility]iology Hospital Follow-Up on 01/02/2017 at 9:45AM.  Contact information: [address] [place] [address] [phone]        [facility], MD. Schedule an appointment as soon as possible for a visit in 2 week(s).   Specialty:  Family Medicine Contact information: [address] [place] [address] [phone]          Allergies  Allergen Reactions  . Celebrex [Celecoxib] Other (See Comments)    GI bleeding  .  Coricidin D Cold-Flu-Sinus  [Chlorphen-Pe-Acetaminophen] Hives    Procedures/Studies: Dg Ribs Unilateral W/chest Left  Result Date: 12/12/2016 CLINICAL DATA:  [place] at home hitting the left lateral chest with pain, dizziness EXAM: LEFT RIBS AND CHEST - 3+ VIEW COMPARISON:  Chest x-ray of 06/02/2016 FINDINGS: Deformity of the right hemithorax appears be due to prior surgical intervention with several surgical sutures present overlying the right upper hemithorax. Otherwise the lungs are clear. No pneumonia or effusion is seen and no pneumothorax is noted. Left rib detail film do show acute fractures of the left anterior eighth and ninth ribs. IMPRESSION: 1. Fractures of the left anterior eighth and ninth ribs. 2. No active lung disease.  No pneumothorax. 3. Postsurgical changes are stable in the right upper hemithorax. Electronically Signed   By: Ivar Drape M.D.   On: 12/12/2016 10:55   Ct Chest W Contrast  Result Date: 12/12/2016 CLINICAL DATA:  Initial evaluation for acute trauma, fall, left-sided rib fractures. Evaluate for pneumothorax or occult splenic injury. EXAM: CT CHEST WITH CONTRAST TECHNIQUE: Multidetector CT imaging of the chest was performed during intravenous contrast administration. CONTRAST:  35m ISOVUE-300 IOPAMIDOL (ISOVUE-300) INJECTION 61% COMPARISON:  Prior radiograph from earlier the same day. FINDINGS: Cardiovascular: Intrathoracic aorta of normal caliber and appearance without acute abnormality. Scattered atheromatous plaque within the aortic arch and descending intrathoracic aorta. Visualized great vessels intact without acute abnormality. Atheromatous plaque noted at the origin of the great vessels. Heart size within normal limits. Diffuse 3 vessel coronary artery calcifications. Aortic valvular calcifications. No pericardial effusion. Pulmonary arteries grossly unremarkable. Mediastinum/Nodes: Thyroid within normal limits. No pathologically enlarged mediastinal, hilar, or axillary  lymph nodes identified. Esophagus mildly patulous without acute abnormality. Small hiatal hernia noted. No mediastinal hematoma. Lungs/Pleura: Scattered patchy opacity/consolidation within the dependent aspects of both lower lobes, which may reflect atelectasis and/ or infiltrates. No pulmonary edema or pleural effusion. No findings to suggest pulmonary contusion. No pneumothorax. No worrisome pulmonary nodule or mass. Upper Abdomen: No acute abnormality within the visualized upper abdomen. Visualized spleen is intact. Large calcified gallstone noted within the gallbladder. Musculoskeletal: There are acute minimally displaced fractures of the left anterolateral eighth, ninth, and tenth ribs. No other acute fracture identified. Remotely healed fractures of the left posterior tenth and eleventh ribs. Fractures of the right posterior tenth and eleventh ribs appear to be subacute to chronic in nature. Several right posterior ribs have been resected. Compression deformities involving the T2, T10, and T12 vertebral bodies appear to be chronic in nature. No worrisome lytic or blastic osseous lesions. IMPRESSION: 1. Acute minimally displaced fractures of the left anterolateral eighth, ninth, and tenth ribs. 2. No other acute traumatic injury within the chest. No pneumothorax or acute splenic injury identified. 3. Multiple additional remotely healed left and right-sided rib fractures as above, with additional remote compression deformities involving the T2, T10, and T12 vertebral bodies.  4. Patchy bibasilar airspace opacities/consolidation. While these findings may in part reflect atelectasis, possible infiltrate could be considered as well. 5. Cholelithiasis. 6. Atherosclerosis with diffuse 3 vessel coronary artery calcifications. Electronically Signed   By: Jeannine Boga M.D.   On: 12/12/2016 13:59   Dg Chest Port 1 View  Result Date: 12/13/2016 CLINICAL DATA:  83 year old male with shortness of breath. Fall with  multiple left rib fractures 4 days ago. Initial encounter. EXAM: PORTABLE CHEST 1 VIEW COMPARISON:  CT chest 12/12/2016 and earlier. FINDINGS: Portable AP semi upright view at at 0942 hours. Increased confluent left infrahilar and medial lung base opacity since the radiographs yesterday, in the region of patchy distal airway and peribronchial opacity seen by CT. At the same time the right lung base remains clear radiographically. No pneumothorax. No definite pleural effusion. Chronic postoperative changes to the upper right ribs. Stable cardiac size and mediastinal contours. Mildly displaced left lower rib fracture re- demonstrated. IMPRESSION: 1. Worsening opacity at the left lung base, suspicious for pneumonia. No definite pleural effusion. 2. Patchy right lung base opacity seen by CT is not evident radiographically. 3. Mildly displaced left lower rib fracture. Electronically Signed   By: Genevie Ann M.D.   On: 12/13/2016 10:04     Subjective: No complaints  Discharge Exam: Vitals:   12/13/16 2033 12/14/16 0508  BP: 138/63 (!) 122/51  Pulse: 89 81  Resp: 20 18  Temp: 99.4 F (37.4 C) 98.1 F (36.7 C)   Vitals:   12/13/16 1500 12/13/16 1545 12/13/16 2033 12/14/16 0508  BP:   138/63 (!) 122/51  Pulse:  93 89 81  Resp:   20 18  Temp:   99.4 F (37.4 C) 98.1 F (36.7 C)  TempSrc:   Oral Oral  SpO2:   98% 95%  Weight: 75.9 kg (167 lb 5.3 oz)     Height:        General: Pt is alert, awake, not in acute distress Cardiovascular: RRR, S1/S2 +, no rubs, no gallops Respiratory: CTA bilaterally, no wheezing, no rhonchi Abdominal: Soft, NT, ND, bowel sounds + Extremities: no edema, no cyanosis   The results of significant diagnostics from this hospitalization (including imaging, microbiology, ancillary and laboratory) are listed below for reference.     Microbiology: Recent Results (from the past 240 hour(s))  TECHNOLOGIST REVIEW     Status: None   Collection Time: 12/06/16  8:12 AM   Result Value Ref Range Status   Technologist Review   Final    Metas and Myelocytes present, Rare Nrbc, sl poly, few ovalos and teardrop     Labs: BNP (last 3 results) No results for input(s): BNP in the last 8760 hours. Basic Metabolic Panel:  Recent Labs Lab 12/12/16 1151 12/13/16 0534 12/14/16 0543  NA 132* 134* 134*  K 3.4* 3.9 4.0  CL 98* 102 99*  CO2 _0 GLUCOSE 115* 115* 121*  BUN 30* 32* 29*  CREATININE 0.89 0.88 0.81  CALCIUM 9.9 9.2 9.4  MG  --  1.8  --    Liver Function Tests:  Recent Labs Lab 12/12/16 1151  AST 20  ALT 19  ALKPHOS 81  BILITOT 0.5  PROT 6.3*  ALBUMIN 3.3*   No results for input(s): LIPASE, AMYLASE in the last 168 hours. No results for input(s): AMMONIA in the last 168 hours. CBC:  Recent Labs Lab 12/12/16 1151 12/13/16 0534 12/14/16 0543  WBC 7.1 8.4 6.4  NEUTROABS 6.1  --   --  HGB 9.9* 8.1* 8.4*  HCT 30.4* 24.4* 25.4*  MCV 90.2 92.1 92.7  PLT 195 149* 180   Cardiac Enzymes: No results for input(s): CKTOTAL, CKMB, CKMBINDEX, TROPONINI in the last 168 hours. BNP: Invalid input(s): POCBNP CBG: No results for input(s): GLUCAP in the last 168 hours. D-Dimer No results for input(s): DDIMER in the last 72 hours. Hgb A1c No results for input(s): HGBA1C in the last 72 hours. Lipid Profile No results for input(s): CHOL, HDL, LDLCALC, TRIG, CHOLHDL, LDLDIRECT in the last 72 hours. Thyroid function studies  Recent Labs  12/13/16 0534  TSH 2.091   Anemia work up  Recent Labs  12/13/16 0534  VITAMINB12 4,573*   Urinalysis    Component Value Date/Time   COLORURINE YELLOW 12/12/2016 1151   APPEARANCEUR HAZY (A) 12/12/2016 1151   LABSPEC 1.018 12/12/2016 1151   PHURINE 7.0 12/12/2016 1151   GLUCOSEU NEGATIVE 12/12/2016 1151   HGBUR NEGATIVE 12/12/2016 1151   [place] 12/12/2016 1151   KETONESUR NEGATIVE 12/12/2016 1151   PROTEINUR NEGATIVE 12/12/2016 1151   UROBILINOGEN 1.0 07/26/2010 1230    NITRITE NEGATIVE 12/12/2016 1151   LEUKOCYTESUR NEGATIVE 12/12/2016 1151   Sepsis Labs Invalid input(s): PROCALCITONIN,  WBC,  LACTICIDVEN Microbiology Recent Results (from the past 240 hour(s))  TECHNOLOGIST REVIEW     Status: None   Collection Time: 12/06/16  8:12 AM  Result Value Ref Range Status   Technologist Review   Final    Metas and Myelocytes present, Rare Nrbc, sl poly, few ovalos and teardrop     SIGNED:   Adolph Clutter, Orpah Melter, MD  [facility] 12/14/2016, 12:44 PM  If 7PM-7AM, please contact night-coverage [email or web address] Password [email or web address]

## 2016-12-14 NOTE — Clinical Social Work Placement (Signed)
   CLINICAL SOCIAL WORK PLACEMENT  NOTE  Date:  12/14/2016  Patient Details  Name: Richard [name] MRN: [id number] Date of Birth: 1933-05-31  Clinical Social Work is seeking post-discharge placement for this patient at the [place] level of care (*CSW will initial, date and re-position this form in  chart as items are completed):  Yes   Patient/family provided with [place] Work Department's list of facilities offering this level of care within the geographic area requested by the patient (or if unable, by the patient's family).  Yes   Patient/family informed of their freedom to choose among providers that offer the needed level of care, that participate in Medicare, Medicaid or managed care program needed by the patient, have an available bed and are willing to accept the patient.  Yes   Patient/family informed of [facility]'s ownership interest in [facility] and [facility], as well as of the fact that they are under no obligation to receive care at these facilities.  PASRR submitted to EDS on 12/13/16     PASRR number received on 12/13/16     Existing PASRR number confirmed on       FL2 transmitted to all facilities in geographic area requested by pt/family on 12/13/16     FL2 transmitted to all facilities within larger geographic area on       Patient informed that his/her managed care company has contracts with or will negotiate with certain facilities, including the following:        Yes   Patient/family informed of bed offers received.  Patient chooses bed at [facility]     Physician recommends and patient chooses bed at      Patient to be transferred to [facility] on 12/14/16.  Patient to be transferred to facility by Ambulance [facility])     Patient family notified on 12/14/16 of transfer.  Name of family member notified:  Daughter- Rip Harbour     PHYSICIAN       Additional Comment: OK per MD for d/c today to SNF. Nursing to  call report to facility; DC summary sent to SNF for review.  Ok per [place] at [facility] for patient's admission.  Daughter will bring in clothes for patient to wear to SNF; EMS notified for transport. No further SW intervention indicated; CSW will sign off.     _______________________________________________ Williemae Area, LCSW 12/14/2016, 3:15 PM

## 2016-12-15 ENCOUNTER — Other Ambulatory Visit: Payer: Self-pay | Admitting: Oncology

## 2016-12-18 NOTE — Telephone Encounter (Signed)
Leave message for pt to call back 

## 2016-12-19 ENCOUNTER — Other Ambulatory Visit: Payer: Self-pay | Admitting: *Deleted

## 2016-12-20 ENCOUNTER — Ambulatory Visit (HOSPITAL_BASED_OUTPATIENT_CLINIC_OR_DEPARTMENT_OTHER): Payer: Medicare Other

## 2016-12-20 ENCOUNTER — Ambulatory Visit (HOSPITAL_BASED_OUTPATIENT_CLINIC_OR_DEPARTMENT_OTHER): Payer: Medicare Other | Admitting: Nurse Practitioner

## 2016-12-20 ENCOUNTER — Other Ambulatory Visit (HOSPITAL_BASED_OUTPATIENT_CLINIC_OR_DEPARTMENT_OTHER): Payer: Medicare Other

## 2016-12-20 VITALS — BP 144/65 | HR 78 | Temp 98.6°F | Resp 16

## 2016-12-20 VITALS — BP 134/61 | HR 85 | Temp 98.6°F | Resp 17 | Wt 164.1 lb

## 2016-12-20 DIAGNOSIS — I10 Essential (primary) hypertension: Secondary | ICD-10-CM

## 2016-12-20 DIAGNOSIS — D801 Nonfamilial hypogammaglobulinemia: Secondary | ICD-10-CM

## 2016-12-20 DIAGNOSIS — Z5112 Encounter for antineoplastic immunotherapy: Secondary | ICD-10-CM

## 2016-12-20 DIAGNOSIS — C9000 Multiple myeloma not having achieved remission: Secondary | ICD-10-CM

## 2016-12-20 DIAGNOSIS — Z7189 Other specified counseling: Secondary | ICD-10-CM

## 2016-12-20 DIAGNOSIS — C9002 Multiple myeloma in relapse: Secondary | ICD-10-CM

## 2016-12-20 DIAGNOSIS — R52 Pain, unspecified: Secondary | ICD-10-CM | POA: Diagnosis not present

## 2016-12-20 LAB — CBC WITH DIFFERENTIAL/PLATELET
BASO%: 0.3 % (ref 0.0–2.0)
BASOS ABS: 0 10*3/uL (ref 0.0–0.1)
EOS ABS: 0 10*3/uL (ref 0.0–0.5)
EOS%: 0.1 % (ref 0.0–7.0)
HCT: 26.7 % — ABNORMAL LOW (ref 38.4–49.9)
HGB: 8.9 g/dL — ABNORMAL LOW (ref 13.0–17.1)
LYMPH#: 0.6 10*3/uL — AB (ref 0.9–3.3)
LYMPH%: 7.6 % — AB (ref 14.0–49.0)
MCH: 30.6 pg (ref 27.2–33.4)
MCHC: 33.4 g/dL (ref 32.0–36.0)
MCV: 91.5 fL (ref 79.3–98.0)
MONO#: 0.7 10*3/uL (ref 0.1–0.9)
MONO%: 9.2 % (ref 0.0–14.0)
NEUT#: 6.3 10*3/uL (ref 1.5–6.5)
NEUT%: 82.8 % — AB (ref 39.0–75.0)
PLATELETS: 245 10*3/uL (ref 140–400)
RBC: 2.92 10*6/uL — ABNORMAL LOW (ref 4.20–5.82)
RDW: 20.8 % — ABNORMAL HIGH (ref 11.0–14.6)
WBC: 7.6 10*3/uL (ref 4.0–10.3)

## 2016-12-20 LAB — BASIC METABOLIC PANEL
Anion Gap: 9 mEq/L (ref 3–11)
BUN: 20.8 mg/dL (ref 7.0–26.0)
CO2: 24 meq/L (ref 22–29)
Calcium: 10 mg/dL (ref 8.4–10.4)
Chloride: 103 mEq/L (ref 98–109)
Creatinine: 0.8 mg/dL (ref 0.7–1.3)
EGFR: 84 mL/min/{1.73_m2} — ABNORMAL LOW (ref >90)
GLUCOSE: 98 mg/dL (ref 70–140)
POTASSIUM: 3.5 meq/L (ref 3.5–5.1)
Sodium: 136 mEq/L (ref 136–145)

## 2016-12-20 MED ORDER — DIPHENHYDRAMINE HCL 25 MG PO CAPS
ORAL_CAPSULE | ORAL | Status: AC
  Filled 2016-12-20: qty 2

## 2016-12-20 MED ORDER — ACETAMINOPHEN 325 MG PO TABS
650.0000 mg | ORAL_TABLET | Freq: Once | ORAL | Status: AC
  Administered 2016-12-20: 650 mg via ORAL

## 2016-12-20 MED ORDER — DIPHENHYDRAMINE HCL 25 MG PO CAPS
50.0000 mg | ORAL_CAPSULE | Freq: Once | ORAL | Status: AC
  Administered 2016-12-20: 50 mg via ORAL

## 2016-12-20 MED ORDER — SODIUM CHLORIDE 0.9 % IV SOLN: 16.0000 mg/kg | Freq: Once | INTRAVENOUS | Status: DC

## 2016-12-20 MED ORDER — SODIUM CHLORIDE 0.9 % IV SOLN
Freq: Once | INTRAVENOUS | Status: AC
  Administered 2016-12-20: 10:00:00 via INTRAVENOUS

## 2016-12-20 MED ORDER — SODIUM CHLORIDE 0.9 % IV SOLN
16.0000 mg/kg | Freq: Once | INTRAVENOUS | Status: AC
  Administered 2016-12-20: 1200 mg via INTRAVENOUS
  Filled 2016-12-20: qty 60

## 2016-12-20 MED ORDER — PROCHLORPERAZINE MALEATE 10 MG PO TABS
ORAL_TABLET | ORAL | Status: AC
  Filled 2016-12-20: qty 1

## 2016-12-20 MED ORDER — MONTELUKAST SODIUM 10 MG PO TABS
ORAL_TABLET | ORAL | Status: AC
  Filled 2016-12-20: qty 1

## 2016-12-20 MED ORDER — METHYLPREDNISOLONE SODIUM SUCC 125 MG IJ SOLR
125.0000 mg | Freq: Once | INTRAMUSCULAR | Status: AC
  Administered 2016-12-20: 125 mg via INTRAVENOUS

## 2016-12-20 MED ORDER — MONTELUKAST SODIUM 10 MG PO TABS
10.0000 mg | ORAL_TABLET | Freq: Once | ORAL | Status: AC
  Administered 2016-12-20: 10 mg via ORAL

## 2016-12-20 MED ORDER — METHYLPREDNISOLONE SODIUM SUCC 125 MG IJ SOLR
INTRAMUSCULAR | Status: AC
  Filled 2016-12-20: qty 2

## 2016-12-20 MED ORDER — ACETAMINOPHEN 325 MG PO TABS
ORAL_TABLET | ORAL | Status: AC
  Filled 2016-12-20: qty 2

## 2016-12-20 MED ORDER — PROCHLORPERAZINE MALEATE 10 MG PO TABS
10.0000 mg | ORAL_TABLET | Freq: Once | ORAL | Status: AC
  Administered 2016-12-20: 10 mg via ORAL

## 2016-12-20 NOTE — Patient Instructions (Signed)
Daratumumab injection What is this medicine? DARATUMUMAB (dar a toom ue mab) is a monoclonal antibody. It is used to treat multiple myeloma. COMMON BRAND NAME(S): DARZALEX What should I tell my health care provider before I take this medicine? They need to know if you have any of these conditions: -infection (especially a virus infection such as chickenpox, cold sores, or herpes) -lung or breathing disease -pregnant or trying to get pregnant -breast-feeding -an unusual or allergic reaction to daratumumab, other medicines, foods, dyes, or preservatives How should I use this medicine? This medicine is for infusion into a vein. It is given by a health care professional in a hospital or clinic setting. Talk to your pediatrician regarding the use of this medicine in children. Special care may be needed. What if I miss a dose? Keep appointments for follow-up doses as directed. It is important not to miss your dose. Call your doctor or health care professional if you are unable to keep an appointment. What may interact with this medicine? Interactions have not been studied. Give your health care provider a list of all the medicines, herbs, non-prescription drugs, or dietary supplements you use. Also tell them if you smoke, drink alcohol, or use illegal drugs. Some items may interact with your medicine. What should I watch for while using this medicine? This drug may make you feel generally unwell. Report any side effects. Continue your course of treatment even though you feel ill unless your doctor tells you to stop. This medicine can cause serious allergic reactions. To reduce your risk you may need to take medicine before treatment with this medicine. Take your medicine as directed. This medicine can affect the results of blood tests to match your blood type. These changes can last for up to 6 months after the final dose. Your healthcare provider will do blood tests to match your blood type before  you start treatment. Tell all of your healthcare providers that you are being treated with this medicine before receiving a blood transfusion. This medicine can affect the results of some tests used to determine treatment response; extra tests may be needed to evaluate response. Do not become pregnant while taking this medicine or for 3 months after stopping it. Women should inform their doctor if they wish to become pregnant or think they might be pregnant. There is a potential for serious side effects to an unborn child. Talk to your health care professional or pharmacist for more information. What side effects may I notice from receiving this medicine? Side effects that you should report to your doctor or health care professional as soon as possible: -allergic reactions like skin rash, itching or hives, swelling of the face, lips, or tongue -breathing problems -chills -cough -dizziness -feeling faint or lightheaded -headache -low blood counts - this medicine may decrease the number of white blood cells, red blood cells and platelets. You may be at increased risk for infections and bleeding. -nausea, vomiting -shortness of breath -signs of decreased platelets or bleeding - bruising, pinpoint red spots on the skin, black, tarry stools, blood in the urine -signs of decreased red blood cells - unusually weak or tired, feeling faint or lightheaded, falls -signs of infection - fever or chills, cough, sore throat, pain or difficulty passing urine Side effects that usually do not require medical attention (report to your doctor or health care professional if they continue or are bothersome): -back pain -diarrhea -muscle cramps -pain, tingling, numbness in the hands or feet -swelling of the ankles,   feet, hands -tiredness Where should I keep my medicine? Keep out of the reach of children. This drug is given in a hospital or clinic and will not be stored at home.  2017 Elsevier/Gold Standard  (2015-12-28 10:38:11)  

## 2016-12-20 NOTE — Progress Notes (Addendum)
[place] OFFICE PROGRESS NOTE   Diagnosis:  Multiple myeloma  INTERVAL HISTORY:   Richard [name] returns for follow-up. He completed cycle 1 Daratumumab 12/06/2016. He developed mild rigors about halfway through the infusion. He received Benadryl, Pepcid and Demerol with resolution of the symptoms. The infusion was resumed. He tolerated the remainder of the infusion with no problem.  He was hospitalized 12/12/2016 through 12/14/2016 following a fall. He was found to have rib fractures. He was discharged to a nursing facility.  His daughter reports he is more confused than typical. She thinks this is due to the Duragesic patch. The patch was decreased from 75 g to 50 g beginning today. At present he denies pain. When he has pain he states it is related to the recent rib fractures. He reports a good appetite. No nausea or vomiting. No mouth sores. No diarrhea. No rash. He denies shortness of breath. He is working with physical therapy. He got up by himself last night and fell.  Objective:  Vital signs in last 24 hours:  Blood pressure 134/61, pulse 85, temperature 98.6 F (37 C), temperature source Oral, resp. rate 17, weight 164 lb 1.6 oz (74.4 kg), SpO2 97 %.    HEENT: No thrush or ulcers. Several facial abrasions. Resp: Lungs clear bilaterally. Cardio: Regular rate and rhythm. GI: Abdomen soft and nontender. No organomegaly. Vascular: Trace bilateral ankle edema. Neuro: Alert. He answers questions appropriately but seems confused. He is oriented to person, place and time. Follows commands.     Lab Results:  Lab Results  Component Value Date   WBC 7.6 12/20/2016   HGB 8.9 (L) 12/20/2016   HCT 26.7 (L) 12/20/2016   MCV 91.5 12/20/2016   PLT 245 12/20/2016   NEUTROABS 6.3 12/20/2016    Imaging:  No results found.  Medications: I have reviewed the patient's current medications.  Assessment/Plan: 1. Light chains were elevated 08/29/2009. A CT of the  chest 08/31/2009 showed a new pleural mass consistent with progression of multiple myeloma. He completed 12 cycles of Revlimid/Decadron. The serum free light chains were improved to 4.34 on 12/29/2009. A restaging CT of the chest 02/19/2010 showed resolution of right axillary lymphadenopathy and a pleural-based mass. The serum free lambda light chains were again elevated.  Salvage Revlimid/Decadron initiated 12/08/2014  Cycle 2 01/05/2015  Serum light chains improved 01/24/2015  Cycle 3 02/01/2015  Cycle 4 03/02/2015  Cycle 5 03/30/2015  Cycle 6 04/27/2015  Cycle 7 05/25/2015  Cycle 8 06/22/2015  Cycle 9 07/20/2015  Cycle 10 08/18/2015  Changed to maintenance Revlimid beginning 09/23/2015  Progressive anemia and Elevated serum free light chains 11/13/2015  Changed to weekly Cytoxan/Velcade/Decadron beginning 11/21/2015, changed to 3/4 week schedule beginning 01/02/2016  Serum light chains improved on 03/26/2016  Serum light chains increased 04/23/2016  Serum light chains increased 05/21/2016  Cycle 1 pomalidomide/decadron 05/30/2016  Cycle 2 pomalidomide/Decadron 06/27/2016  Serum light chains improved 07/03/2016  Cycle 3 Pomalidomide/Decadron 07/25/2016  Cycle 4 Pomalidomide/Decadron 09/04/2016  Cycle 5 Pomalidomide/Decadron 10/02/2016  Cycle 6 Pomalidomide/Decadron 11/08/2016  Progressive anemia, elevated light chains December 2017  Salvage Daratumumab/dex started 12/06/2016 2.History of back pain, likely related to the pleural-based mass at the right chest, resolved. 2. Right axillary/subpectoral lymphadenopathy on a CT of the chest 08/31/2009, likely related to multiple myeloma, resolved. 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 He is maintained on every three-month Zometa. 4. Pancytopenia secondary to Revlimid and multiple myeloma. He has persistent mild thrombocytopenia. 5. Hypertension.  6. Status post hip replacement. 7. History  of back surgery. 8. History of trigeminal neuralgia. 9. Status post removal of a lipoma from the right axilla in February 2010. 10. 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 11. Esophageal reflux disease, followed by Dr. Janace Hoard.  12. Esophageal stricture, status post an evaluation by Dr. Henrene Pastor. 13. Right vocal cord lesion, status post a biopsy by Dr. 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 He completed radiation under the direction of Dr. Valere Dross on 03/15/2011. 14. Admission with pneumonia 03/26/2010. 15. History of Atrial flutter/fibrillation while hospitalized August 2011. He is followed by Dr. Percival Spanish. 16. Elevated prostate specific antigen, status post a biopsy 01/23/2011. He was found to have a Gleason 6 cancer in 10% of the cores. He is followed with an observation approach. He is now followed by Dr. Diona Fanti. 17. History of Mild hypercalcemia  18. Fall with a left wrist fracture June 2015  19. Right chest wall pain secondary to a right second rib plasmacytoma confirmed on a CT 11/28/2014, resolved 20. History of Hyponatremia 21. history of Thrombocytopenia secondary progression of multiple myeloma and chemotherapy 22. Fall with left femoral neck fracture 06/02/2016, left hemiarthroplasty 06/03/2016 23. Hospital delirium June 2017 24. Zoster rash, left thigh, November 2017 25. Hospitalization 12/12/2016 through 12/14/2016 following a fall with rib fractures.   Disposition: Richard [name] appears unchanged. He has completed 1 weekly treatment with daratumumab. He had mild rigors with the first treatment. Week 2 was held due to hospitalization following a fall/rib fractures. Plan to resume treatment today with week 2 daratumumab.   He has had increased confusion from baseline. This may be related to the Duragesic patch. We will discontinue the Duragesic patch. He will take Roxicodone as needed.  He will return for a follow-up visit and  week 3 daratumumab 12/27/2016. He or his daughter will contact the office in the interim with any problems.  Patient seen with Dr. Benay Spice.     Malaquias Card ANP/GNP-BC   12/20/2016  9:19 AM  This was a shared visit with Vint Card. Richard [name] will complete week 2 of daratumumab today. He will return for an office visit in one week.  Julieanne Manson, M.D.

## 2016-12-20 NOTE — Progress Notes (Signed)
12/20/2016 Patient arrived for Darzalex. Daughter present with patient initially.  She stated he had several falls recently and yet again this morning at the facility.  Patient has a fentanyl patch on that is causing his behavior to be more out of character.    During transfusion patient attempted to use urinal at chairside and was becoming quite frustrated.  Blood pressure reading during 150 ml/hr rate increase were elevated because of his increased agitation with attempting to urinate without any result.  Got patient to rest and blood pressure returned to baseline.  Will continue to monitor closely as patient had previous had reaction.

## 2016-12-22 ENCOUNTER — Other Ambulatory Visit: Payer: Self-pay | Admitting: Oncology

## 2016-12-26 ENCOUNTER — Other Ambulatory Visit: Payer: Self-pay | Admitting: Nurse Practitioner

## 2016-12-26 DIAGNOSIS — C9000 Multiple myeloma not having achieved remission: Secondary | ICD-10-CM

## 2016-12-27 ENCOUNTER — Ambulatory Visit (HOSPITAL_BASED_OUTPATIENT_CLINIC_OR_DEPARTMENT_OTHER): Payer: Medicare Other

## 2016-12-27 ENCOUNTER — Other Ambulatory Visit (HOSPITAL_BASED_OUTPATIENT_CLINIC_OR_DEPARTMENT_OTHER): Payer: Medicare Other

## 2016-12-27 VITALS — BP 141/78 | HR 92 | Temp 98.9°F | Resp 20

## 2016-12-27 DIAGNOSIS — C9000 Multiple myeloma not having achieved remission: Secondary | ICD-10-CM

## 2016-12-27 DIAGNOSIS — C9002 Multiple myeloma in relapse: Secondary | ICD-10-CM

## 2016-12-27 DIAGNOSIS — Z5112 Encounter for antineoplastic immunotherapy: Secondary | ICD-10-CM

## 2016-12-27 LAB — COMPREHENSIVE METABOLIC PANEL
ALT: 14 U/L (ref 0–55)
AST: 12 U/L (ref 5–34)
Albumin: 2.7 g/dL — ABNORMAL LOW (ref 3.5–5.0)
Alkaline Phosphatase: 75 U/L (ref 40–150)
Anion Gap: 8 mEq/L (ref 3–11)
BUN: 18.8 mg/dL (ref 7.0–26.0)
CALCIUM: 10 mg/dL (ref 8.4–10.4)
CHLORIDE: 98 meq/L (ref 98–109)
CO2: 26 meq/L (ref 22–29)
CREATININE: 0.8 mg/dL (ref 0.7–1.3)
EGFR: 83 mL/min/{1.73_m2} — ABNORMAL LOW (ref >90)
Glucose: 97 mg/dl (ref 70–140)
POTASSIUM: 3.7 meq/L (ref 3.5–5.1)
Sodium: 132 mEq/L — ABNORMAL LOW (ref 136–145)
Total Bilirubin: 0.7 mg/dL (ref 0.20–1.20)
Total Protein: 5.7 g/dL — ABNORMAL LOW (ref 6.4–8.3)

## 2016-12-27 LAB — CBC WITH DIFFERENTIAL/PLATELET
BASO%: 0 % (ref 0.0–2.0)
Basophils Absolute: 0 10*3/uL (ref 0.0–0.1)
EOS ABS: 0.1 10*3/uL (ref 0.0–0.5)
EOS%: 1.2 % (ref 0.0–7.0)
HEMATOCRIT: 28.8 % — AB (ref 38.4–49.9)
HGB: 9.2 g/dL — ABNORMAL LOW (ref 13.0–17.1)
LYMPH#: 0.4 10*3/uL — AB (ref 0.9–3.3)
LYMPH%: 7.1 % — ABNORMAL LOW (ref 14.0–49.0)
MCH: 29.6 pg (ref 27.2–33.4)
MCHC: 31.9 g/dL — ABNORMAL LOW (ref 32.0–36.0)
MCV: 92.6 fL (ref 79.3–98.0)
MONO#: 0.4 10*3/uL (ref 0.1–0.9)
MONO%: 8.4 % (ref 0.0–14.0)
NEUT%: 83.3 % — AB (ref 39.0–75.0)
NEUTROS ABS: 4.1 10*3/uL (ref 1.5–6.5)
PLATELETS: 144 10*3/uL (ref 140–400)
RBC: 3.11 10*6/uL — AB (ref 4.20–5.82)
RDW: 19.2 % — ABNORMAL HIGH (ref 11.0–14.6)
WBC: 4.9 10*3/uL (ref 4.0–10.3)

## 2016-12-27 MED ORDER — DIPHENHYDRAMINE HCL 25 MG PO CAPS
50.0000 mg | ORAL_CAPSULE | Freq: Once | ORAL | Status: AC
  Administered 2016-12-27: 50 mg via ORAL

## 2016-12-27 MED ORDER — PROCHLORPERAZINE MALEATE 10 MG PO TABS
10.0000 mg | ORAL_TABLET | Freq: Once | ORAL | Status: AC
  Administered 2016-12-27: 10 mg via ORAL

## 2016-12-27 MED ORDER — SODIUM CHLORIDE 0.9 % IV SOLN
15.8000 mg/kg | Freq: Once | INTRAVENOUS | Status: AC
  Administered 2016-12-27: 1200 mg via INTRAVENOUS
  Filled 2016-12-27: qty 60

## 2016-12-27 MED ORDER — ACETAMINOPHEN 325 MG PO TABS
650.0000 mg | ORAL_TABLET | Freq: Once | ORAL | Status: AC
  Administered 2016-12-27: 650 mg via ORAL

## 2016-12-27 MED ORDER — PROCHLORPERAZINE MALEATE 10 MG PO TABS
ORAL_TABLET | ORAL | Status: AC
  Filled 2016-12-27: qty 1

## 2016-12-27 MED ORDER — ACETAMINOPHEN 325 MG PO TABS
ORAL_TABLET | ORAL | Status: AC
  Filled 2016-12-27: qty 2

## 2016-12-27 MED ORDER — MONTELUKAST SODIUM 10 MG PO TABS
ORAL_TABLET | ORAL | Status: AC
  Filled 2016-12-27: qty 1

## 2016-12-27 MED ORDER — DIPHENHYDRAMINE HCL 25 MG PO CAPS
ORAL_CAPSULE | ORAL | Status: AC
  Filled 2016-12-27: qty 2

## 2016-12-27 MED ORDER — METHYLPREDNISOLONE SODIUM SUCC 125 MG IJ SOLR
125.0000 mg | Freq: Once | INTRAMUSCULAR | Status: AC
  Administered 2016-12-27: 125 mg via INTRAVENOUS

## 2016-12-27 MED ORDER — METHYLPREDNISOLONE SODIUM SUCC 125 MG IJ SOLR
INTRAMUSCULAR | Status: AC
  Filled 2016-12-27: qty 2

## 2016-12-27 MED ORDER — SODIUM CHLORIDE 0.9 % IV SOLN
Freq: Once | INTRAVENOUS | Status: AC
  Administered 2016-12-27: 09:00:00 via INTRAVENOUS

## 2016-12-27 MED ORDER — MONTELUKAST SODIUM 10 MG PO TABS
10.0000 mg | ORAL_TABLET | Freq: Once | ORAL | Status: AC
  Administered 2016-12-27: 10 mg via ORAL

## 2016-12-27 NOTE — Patient Instructions (Signed)
[facility] Discharge Instructions for Patients Receiving Chemotherapy  Today you received the following chemotherapy agents Darzalex.  To help prevent nausea and vomiting after your treatment, we encourage you to take your nausea medication as directed.  If you develop nausea and vomiting that is not controlled by your nausea medication, call the clinic.   BELOW ARE SYMPTOMS THAT SHOULD BE REPORTED IMMEDIATELY:  *FEVER GREATER THAN 100.5 F  *CHILLS WITH OR WITHOUT FEVER  NAUSEA AND VOMITING THAT IS NOT CONTROLLED WITH YOUR NAUSEA MEDICATION  *UNUSUAL SHORTNESS OF BREATH  *UNUSUAL BRUISING OR BLEEDING  TENDERNESS IN MOUTH AND THROAT WITH OR WITHOUT PRESENCE OF ULCERS  *URINARY PROBLEMS  *BOWEL PROBLEMS  UNUSUAL RASH Items with * indicate a potential emergency and should be followed up as soon as possible.  Feel free to call the clinic you have any questions or concerns. The clinic phone number is [phone].  Please show the CHEMO ALERT CARD at check-in to the Emergency Department and triage nurse.    

## 2016-12-29 ENCOUNTER — Other Ambulatory Visit: Payer: Self-pay | Admitting: Oncology

## 2017-01-02 ENCOUNTER — Ambulatory Visit (INDEPENDENT_AMBULATORY_CARE_PROVIDER_SITE_OTHER): Payer: Medicare Other | Admitting: Cardiology

## 2017-01-02 ENCOUNTER — Telehealth: Payer: Self-pay | Admitting: Oncology

## 2017-01-02 VITALS — BP 142/88 | HR 88 | Ht 68.0 in | Wt 168.0 lb

## 2017-01-02 DIAGNOSIS — R296 Repeated falls: Secondary | ICD-10-CM | POA: Diagnosis not present

## 2017-01-02 DIAGNOSIS — I498 Other specified cardiac arrhythmias: Secondary | ICD-10-CM

## 2017-01-02 NOTE — Progress Notes (Signed)
Cardiology Office Note   Date:  01/03/2017   ID:  Richard [name], DOB Nov 06, 1933, MRN [id number]  PCP:  Rachell Cipro, MD  Cardiologist:   Minus Breeding, MD  Referring:  Dr. Julieanne Manson   Chief Complaint  Patient presents with  . Atrial Fibrillation     History of Present Illness: Richard [name] is a 83 y.o. male who presents for follow up of atrial fib.  He had this in the past when she was acutely ill.   He was in the oncology office recently and was thought to be in atrial fib.  However, he had MAT.  He did not have atrial fib.  I had him where a Holter which did not demonstrate atrial fib.  He was hospitalized briefly after a fall with broken ribs. Monitor his heart rate. It was multifocal atrial tachycardia but no atrial fibrillation and I reviewed these records.   He denies any pain. He's had no palpitations presyncope or syncope. He's had multiple falls over the last year or so with a fracture, patella fracture, skin lacerations and then the broken ribs.  Past Medical History:  Diagnosis Date  . Anemia    pt's daughter denies  . Arthritis   . Atrial fibrillation ([place])   . Cancer of true vocal cord ([place]) 01/10/2011   right  . Cataract    bil removed  . Cholelithiasis   . Dyslipidemia   . Dysphagia following unspecified cerebrovascular disease   . Esophageal stricture   . Essential hypertension, benign   . Facial basal cell cancer    nose  . GERD (gastroesophageal reflux disease)   . Hiatal hernia   . History of BPH   . History of radiation therapy 01/29/11 -03/15/11   larynx 6600 cGy 33 sessions  . Hyperbilirubinemia   . Hypertension   . Hypokalemia   . Hyponatremia   . Left displaced femoral neck fracture ([place])   . Multiple myeloma   . Pancytopenia ([place])   . Pneumonia   . Prostate cancer ([place]) 01/23/11   Gleason 3+3=6  . Pulmonary hypertension     Past Surgical History:  Procedure Laterality Date  . APPENDECTOMY    . BACK SURGERY    . COLONOSCOPY      . ESOPHAGEAL DILATION    . HIP ARTHROPLASTY Left 06/03/2016   Procedure: ARTHROPLASTY BIPOLAR HIP LEFT  (HEMIARTHROPLASTY);  Surgeon: Paralee Cancel, MD;  Location: [facility]S;  Service: Orthopedics;  Laterality: Left;  . HIP SURGERY    . right chest wall resection     of second ,third,and fourth rib  . TONSILLECTOMY    . trigeminal neuralgia    . UPPER GASTROINTESTINAL ENDOSCOPY       Current Outpatient Prescriptions  Medication Sig Dispense Refill  . acetaminophen (TYLENOL) 325 MG tablet Take 2 tablets (650 mg total) by mouth every 6 (six) hours as needed for mild pain, fever or headache.    Marland Kitchen acyclovir (ZOVIRAX) 400 MG tablet Take 400 mg by mouth at bedtime.    Marland Kitchen aspirin EC 81 MG tablet Take 81 mg by mouth every morning.    . Cholecalciferol 5000 UNITS TABS Take 5,000 Units by mouth every morning.     . Cyanocobalamin (B-12) 2500 MCG SUBL Take 2,500 mcg by mouth every morning.     . docusate sodium (COLACE) 100 MG capsule Take 100 mg by mouth 2 (two) times daily.    . feeding supplement, ENSURE ENLIVE, (ENSURE ENLIVE) LIQD  Take 237 mLs by mouth 2 (two) times daily between meals.    . ferrous sulfate 325 (65 FE) MG tablet Take 650 mg by mouth every other day.    . metoprolol tartrate (LOPRESSOR) 25 MG tablet Take 1 tablet (25 mg total) by mouth 2 (two) times daily. 60 tablet 0  . Multiple Vitamin (MULTIVITAMIN WITH MINERALS) TABS tablet Take 1 tablet by mouth every morning.    Marland Kitchen omeprazole (PRILOSEC) 40 MG capsule Take 40 mg by mouth daily before breakfast.   0  . ondansetron (ZOFRAN) 4 MG tablet Take 1 tablet (4 mg total) by mouth every 8 (eight) hours as needed for nausea or vomiting. 20 tablet 0  . oxyCODONE (OXY IR/ROXICODONE) 5 MG immediate release tablet Take 1 tablet (5 mg total) by mouth every 6 (six) hours as needed for moderate pain. 5 tablet 0  . polyethylene glycol (MIRALAX / GLYCOLAX) packet Take 17 g by mouth daily as needed for mild constipation or moderate constipation.     Marland Kitchen  rOPINIRole (REQUIP) 0.25 MG tablet Take 0.25-0.5 mg by mouth at bedtime as needed (RESTLESS LEG).     . Saw Palmetto 160 MG CAPS Take 320 mg by mouth every morning.     . tamsulosin (FLOMAX) 0.4 MG CAPS capsule Take 0.4 mg by mouth daily with breakfast.    . dexamethasone (DECADRON) 4 MG tablet Take 5 tablets (20 mg total) by mouth once a week. May split dose over 2 days.     No current facility-administered medications for this visit.     Allergies:   Celebrex [celecoxib] and Coricidin d cold-flu-sinus  [chlorphen-pe-acetaminophen]    ROS:  Please see the history of present illness.   Otherwise, review of systems are positive for none.   All other systems are reviewed and negative.   (Compromised by dementia   PHYSICAL EXAM: VS:  BP (!) 142/88   Pulse 88   Ht 5' 8"  (1.727 m)   Wt 168 lb (76.2 kg)   BMI 25.54 kg/m  , BMI Body mass index is 25.54 kg/m. GENERAL:  Frail appearing, dentures NECK:  No jugular venous distention, waveform within normal limits, carotid upstroke brisk and symmetric, no bruits, no thyromegaly LUNGS:  Clear to auscultation bilaterally HEART:  PMI not displaced or sustained,S1 and S2 within normal limits, no S3, no S4, no clicks, no rubs, 2 out of 6 apical systolic murmur early peaking and nonradiating, no diastolic murmurs ABD:  Flat, positive bowel sounds normal in frequency in pitch, no bruits, no rebound, no guarding, no midline pulsatile mass, no hepatomegaly, no splenomegaly EXT:  2 plus pulses throughout, no edema, no cyanosis no clubbing   EKG:  EKG is not ordered today.   Recent Labs: 12/13/2016: Magnesium 1.8; TSH 2.091 01/03/2017: ALT 16; BUN 10.9; Creatinine 0.7; HGB 9.8; Platelets 141; Potassium 3.6; Sodium 133    Lipid Panel    Component Value Date/Time   CHOL  07/27/2010 0132    82        ATP III CLASSIFICATION:  <200     mg/dL   Desirable  200-239  mg/dL   Borderline High  >=240    mg/dL   High          TRIG 75 07/27/2010 0132   HDL  31 (L) 07/27/2010 0132   CHOLHDL 2.6 07/27/2010 0132   VLDL 15 07/27/2010 0132   LDLCALC  07/27/2010 0132    36  Total Cholesterol/HDL:CHD Risk Coronary Heart Disease Risk Table                     Men   Women  1/2 Average Risk   3.4   3.3  Average Risk       5.0   4.4  2 X Average Risk   9.6   7.1  3 X Average Risk  23.4   11.0        Use the calculated Patient Ratio above and the CHD Risk Table to determine the patient's CHD Risk.        ATP III CLASSIFICATION (LDL):  <100     mg/dL   Optimal  100-129  mg/dL   Near or Above                    Optimal  130-159  mg/dL   Borderline  160-189  mg/dL   High  >190     mg/dL   Very High      Wt Readings from Last 3 Encounters:  01/03/17 164 lb 1.6 oz (74.4 kg)  01/02/17 168 lb (76.2 kg)  12/20/16 164 lb 1.6 oz (74.4 kg)      Other studies Reviewed: Additional studies/ records that were reviewed today include: Hospital records Review of the above records demonstrates:      ASSESSMENT AND PLAN:  ATRIAL FIB:  I have not seen recent documented atrial fibrillation. Regardless this patient would be too high risk for anticoagulation given his frequent falls. He's not having any symptomatic tachypalpitations and has had no syncope. No change in therapy is indicated.  ABNORMAL ECHO:  He has mild aortic stenosis. He can be followed clinically.   Current medicines are reviewed at length with the patient today.  The patient does not have concerns regarding medicines.  The following changes have been made:  None  Labs/ tests ordered today include: None  No orders of the defined types were placed in this encounter.    Disposition:   FU with me as needed.      Signed, Minus Breeding, MD  01/03/2017 1:29 PM    [facility]

## 2017-01-02 NOTE — Patient Instructions (Addendum)
Medication Instructions:  Continue current medications  Labwork: None Ordered  Testing/Procedures: None Ordered  Follow-Up: Your physician recommends that you schedule a follow-up appointment in: As Needed   Any Other Special Instructions Will Be Listed Below (If Applicable).   If you need a refill on your cardiac medications before your next appointment, please call your pharmacy.   

## 2017-01-02 NOTE — Telephone Encounter (Signed)
Called patient, per Audie Clear to confirm appointment. Left message on voice mail with appointment details. 01/02/17

## 2017-01-03 ENCOUNTER — Ambulatory Visit (HOSPITAL_BASED_OUTPATIENT_CLINIC_OR_DEPARTMENT_OTHER): Payer: Medicare Other

## 2017-01-03 ENCOUNTER — Other Ambulatory Visit (HOSPITAL_BASED_OUTPATIENT_CLINIC_OR_DEPARTMENT_OTHER): Payer: Medicare Other

## 2017-01-03 ENCOUNTER — Telehealth: Payer: Self-pay | Admitting: *Deleted

## 2017-01-03 ENCOUNTER — Encounter: Payer: Self-pay | Admitting: *Deleted

## 2017-01-03 ENCOUNTER — Ambulatory Visit (HOSPITAL_BASED_OUTPATIENT_CLINIC_OR_DEPARTMENT_OTHER): Payer: Medicare Other | Admitting: Oncology

## 2017-01-03 ENCOUNTER — Telehealth: Payer: Self-pay | Admitting: Oncology

## 2017-01-03 ENCOUNTER — Encounter: Payer: Self-pay | Admitting: Cardiology

## 2017-01-03 VITALS — BP 153/85 | HR 77 | Temp 98.5°F | Resp 18 | Ht 68.0 in | Wt 164.1 lb

## 2017-01-03 VITALS — BP 158/87 | HR 100 | Temp 98.1°F | Resp 18

## 2017-01-03 DIAGNOSIS — C9002 Multiple myeloma in relapse: Secondary | ICD-10-CM

## 2017-01-03 DIAGNOSIS — I10 Essential (primary) hypertension: Secondary | ICD-10-CM

## 2017-01-03 DIAGNOSIS — R351 Nocturia: Secondary | ICD-10-CM

## 2017-01-03 DIAGNOSIS — Z7189 Other specified counseling: Secondary | ICD-10-CM

## 2017-01-03 DIAGNOSIS — D801 Nonfamilial hypogammaglobulinemia: Secondary | ICD-10-CM | POA: Diagnosis not present

## 2017-01-03 DIAGNOSIS — C9000 Multiple myeloma not having achieved remission: Secondary | ICD-10-CM

## 2017-01-03 DIAGNOSIS — Z5112 Encounter for antineoplastic immunotherapy: Secondary | ICD-10-CM

## 2017-01-03 LAB — CBC WITH DIFFERENTIAL/PLATELET
BASO%: 0.2 % (ref 0.0–2.0)
Basophils Absolute: 0 10*3/uL (ref 0.0–0.1)
EOS ABS: 0 10*3/uL (ref 0.0–0.5)
EOS%: 0.4 % (ref 0.0–7.0)
HCT: 29.7 % — ABNORMAL LOW (ref 38.4–49.9)
HEMOGLOBIN: 9.8 g/dL — AB (ref 13.0–17.1)
LYMPH#: 0.5 10*3/uL — AB (ref 0.9–3.3)
LYMPH%: 7.9 % — AB (ref 14.0–49.0)
MCH: 29.2 pg (ref 27.2–33.4)
MCHC: 33 g/dL (ref 32.0–36.0)
MCV: 88.5 fL (ref 79.3–98.0)
MONO#: 0.4 10*3/uL (ref 0.1–0.9)
MONO%: 6.3 % (ref 0.0–14.0)
NEUT%: 85.2 % — ABNORMAL HIGH (ref 39.0–75.0)
NEUTROS ABS: 4.9 10*3/uL (ref 1.5–6.5)
PLATELETS: 141 10*3/uL (ref 140–400)
RBC: 3.35 10*6/uL — AB (ref 4.20–5.82)
RDW: 19.2 % — AB (ref 11.0–14.6)
WBC: 5.8 10*3/uL (ref 4.0–10.3)

## 2017-01-03 LAB — COMPREHENSIVE METABOLIC PANEL
ALBUMIN: 2.8 g/dL — AB (ref 3.5–5.0)
ALT: 16 U/L (ref 0–55)
ANION GAP: 10 meq/L (ref 3–11)
AST: 14 U/L (ref 5–34)
Alkaline Phosphatase: 86 U/L (ref 40–150)
BILIRUBIN TOTAL: 1.07 mg/dL (ref 0.20–1.20)
BUN: 10.9 mg/dL (ref 7.0–26.0)
CO2: 26 meq/L (ref 22–29)
CREATININE: 0.7 mg/dL (ref 0.7–1.3)
Calcium: 10 mg/dL (ref 8.4–10.4)
Chloride: 97 mEq/L — ABNORMAL LOW (ref 98–109)
EGFR: 86 mL/min/{1.73_m2} — ABNORMAL LOW (ref >90)
Glucose: 112 mg/dl (ref 70–140)
Potassium: 3.6 mEq/L (ref 3.5–5.1)
Sodium: 133 mEq/L — ABNORMAL LOW (ref 136–145)
Total Protein: 6 g/dL — ABNORMAL LOW (ref 6.4–8.3)

## 2017-01-03 MED ORDER — ACETAMINOPHEN 325 MG PO TABS
ORAL_TABLET | ORAL | Status: AC
  Filled 2017-01-03: qty 2

## 2017-01-03 MED ORDER — ACETAMINOPHEN 325 MG PO TABS
650.0000 mg | ORAL_TABLET | Freq: Once | ORAL | Status: AC
  Administered 2017-01-03: 650 mg via ORAL

## 2017-01-03 MED ORDER — SODIUM CHLORIDE 0.9 % IV SOLN
Freq: Once | INTRAVENOUS | Status: AC
  Administered 2017-01-03: 11:00:00 via INTRAVENOUS

## 2017-01-03 MED ORDER — MONTELUKAST SODIUM 10 MG PO TABS
10.0000 mg | ORAL_TABLET | Freq: Once | ORAL | Status: AC
  Administered 2017-01-03: 10 mg via ORAL

## 2017-01-03 MED ORDER — PROCHLORPERAZINE MALEATE 10 MG PO TABS
10.0000 mg | ORAL_TABLET | Freq: Once | ORAL | Status: AC
  Administered 2017-01-03: 10 mg via ORAL

## 2017-01-03 MED ORDER — METHYLPREDNISOLONE SODIUM SUCC 125 MG IJ SOLR
INTRAMUSCULAR | Status: AC
  Filled 2017-01-03: qty 2

## 2017-01-03 MED ORDER — DIPHENHYDRAMINE HCL 25 MG PO CAPS
50.0000 mg | ORAL_CAPSULE | Freq: Once | ORAL | Status: AC
  Administered 2017-01-03: 50 mg via ORAL

## 2017-01-03 MED ORDER — SODIUM CHLORIDE 0.9 % IV SOLN
15.7000 mg/kg | Freq: Once | INTRAVENOUS | Status: AC
  Administered 2017-01-03: 1200 mg via INTRAVENOUS
  Filled 2017-01-03: qty 60

## 2017-01-03 MED ORDER — DIPHENHYDRAMINE HCL 25 MG PO CAPS
ORAL_CAPSULE | ORAL | Status: AC
  Filled 2017-01-03: qty 2

## 2017-01-03 MED ORDER — DEXAMETHASONE 4 MG PO TABS: 20.0000 mg | ORAL_TABLET | ORAL | Status: AC

## 2017-01-03 MED ORDER — METHYLPREDNISOLONE SODIUM SUCC 125 MG IJ SOLR
125.0000 mg | Freq: Once | INTRAMUSCULAR | Status: AC
  Administered 2017-01-03: 125 mg via INTRAVENOUS

## 2017-01-03 MED ORDER — MONTELUKAST SODIUM 10 MG PO TABS
ORAL_TABLET | ORAL | Status: AC
  Filled 2017-01-03: qty 1

## 2017-01-03 MED ORDER — PROCHLORPERAZINE MALEATE 10 MG PO TABS
ORAL_TABLET | ORAL | Status: AC
  Filled 2017-01-03: qty 1

## 2017-01-03 NOTE — Progress Notes (Signed)
Patient tolerated treatment well. Patient and vital signs stable upon discharge.  

## 2017-01-03 NOTE — Patient Instructions (Signed)
[facility] Discharge Instructions for Patients Receiving Chemotherapy  Today you received the following chemotherapy agents Darzalex.  To help prevent nausea and vomiting after your treatment, we encourage you to take your nausea medication as directed.  If you develop nausea and vomiting that is not controlled by your nausea medication, call the clinic.   BELOW ARE SYMPTOMS THAT SHOULD BE REPORTED IMMEDIATELY:  *FEVER GREATER THAN 100.5 F  *CHILLS WITH OR WITHOUT FEVER  NAUSEA AND VOMITING THAT IS NOT CONTROLLED WITH YOUR NAUSEA MEDICATION  *UNUSUAL SHORTNESS OF BREATH  *UNUSUAL BRUISING OR BLEEDING  TENDERNESS IN MOUTH AND THROAT WITH OR WITHOUT PRESENCE OF ULCERS  *URINARY PROBLEMS  *BOWEL PROBLEMS  UNUSUAL RASH Items with * indicate a potential emergency and should be followed up as soon as possible.  Feel free to call the clinic you have any questions or concerns. The clinic phone number is [phone].  Please show the CHEMO ALERT CARD at check-in to the Emergency Department and triage nurse.    

## 2017-01-03 NOTE — Telephone Encounter (Signed)
Per 1/26 LOS and staff message I have scheduled appts. No available on 2/2 for treatment, moved to 2/1 and advised scheduler.

## 2017-01-03 NOTE — Progress Notes (Signed)
[facility] OFFICE PROGRESS NOTE   Diagnosis: Multiple myeloma  INTERVAL HISTORY:   Richard [name] returns with his daughter. He is living with his daughter after discharge from the skilled nursing facility. He completed another treatment with daratumumab on 12/27/2016. He reports tolerating the treatment well. He has frequent nocturia. He has difficulty ambulating on his on. He denies pain.  Objective:  Vital signs in last 24 hours:  Blood pressure (!) 153/85, pulse 77, temperature 98.5 F (36.9 C), temperature source Oral, resp. rate 18, height _0  (1.727 m), weight 164 lb 1.6 oz (74.4 kg), SpO2 97 %.    HEENT: No thrush Resp: Lungs clear bilaterally Cardio: Regular rhythm with premature beats GI: No hepatosplenomegaly Vascular: No leg edema Neuro: Alert, follows commands, slow unsteady gait, required 2 person assist to get onto the examination table      Lab Results:  Lab Results  Component Value Date   WBC 5.8 01/03/2017   HGB 9.8 (L) 01/03/2017   HCT 29.7 (L) 01/03/2017   MCV 88.5 01/03/2017   PLT 141 01/03/2017   NEUTROABS 4.9 01/03/2017     Medications: I have reviewed the patient's current medications.  Assessment/Plan: 1. Light chains were elevated 08/29/2009. A CT of the chest 08/31/2009 showed a new pleural mass consistent with progression of multiple myeloma. He completed 12 cycles of Revlimid/Decadron. The serum free light chains were improved to 4.34 on 12/29/2009. A restaging CT of the chest 02/19/2010 showed resolution of right axillary lymphadenopathy and a pleural-based mass. The serum free lambda light chains were again elevated.  Salvage Revlimid/Decadron initiated 12/08/2014  Cycle 2 01/05/2015  Serum light chains improved 01/24/2015  Cycle 3 02/01/2015  Cycle 4 03/02/2015  Cycle 5 03/30/2015  Cycle 6 04/27/2015  Cycle 7 05/25/2015  Cycle 8 06/22/2015  Cycle 9 07/20/2015  Cycle 10 08/18/2015  Changed to  maintenance Revlimid beginning 09/23/2015  Progressive anemia and Elevated serum free light chains 11/13/2015  Changed to weekly Cytoxan/Velcade/Decadron beginning 11/21/2015, changed to 3/4 week schedule beginning 01/02/2016  Serum light chains improved on 03/26/2016  Serum light chains increased 04/23/2016  Serum light chains increased 05/21/2016  Cycle 1 pomalidomide/decadron 05/30/2016  Cycle 2 pomalidomide/Decadron 06/27/2016  Serum light chains improved 07/03/2016  Cycle 3 Pomalidomide/Decadron 07/25/2016  Cycle 4 Pomalidomide/Decadron 09/04/2016  Cycle 5 Pomalidomide/Decadron 10/02/2016  Cycle 6 Pomalidomide/Decadron 11/08/2016  Progressive anemia, elevated light chains December 2017  Salvage Daratumumab/dexstarted 12/06/2016 2.History of back pain, likely related to the pleural-based mass at the right chest, resolved. 2. Right axillary/subpectoral lymphadenopathy on a CT of the chest 08/31/2009, likely related to multiple myeloma, resolved. 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 He is maintained on every three-month Zometa. 4. Pancytopenia secondary to Revlimid and multiple myeloma. He has persistent mild thrombocytopenia. 5. Hypertension. 6. Status post hip replacement. 7. History of back surgery. 8. History of trigeminal neuralgia. 9. Status post removal of a lipoma from the right axilla in February 2010. 10. 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 11. Esophageal reflux disease, followed by Dr. Janace Hoard.  12. Esophageal stricture, status post an evaluation by Dr. Henrene Pastor. 13. Right vocal cord lesion, status post a biopsy by Dr. 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 He completed radiation under the direction of Dr. Valere Dross on 03/15/2011. 14. Admission with pneumonia 03/26/2010. 15. History of Atrial flutter/fibrillation while hospitalized August 2011. He is followed by Dr.  Percival Spanish. 16. Elevated prostate specific antigen, status post a biopsy 01/23/2011. He  was found to have a Gleason 6 cancer in 10% of the cores. He is followed with an observation approach. He is now followed by Dr. Diona Fanti. 17. History of Mild hypercalcemia  18. Fall with a left wrist fracture June 2015  19. Right chest wall pain secondary to a right second rib plasmacytoma confirmed on a CT 11/28/2014, resolved 20. History of Hyponatremia 21. history of Thrombocytopenia secondary progression of multiple myeloma and chemotherapy 22. Fall with left femoral neck fracture 06/02/2016, left hemiarthroplasty 06/03/2016 23. Hospital delirium June 2017 24. Zoster rash, left thigh, November 2017 25. Hospitalization 12/12/2016 through 12/14/2016 following a fall with rib fractures.    Disposition:  Richard [name] appears stable. The plan is to continue weekly daratumumab therapy. Hemoglobin is improved today. He will be scheduled for an office visit in 2 weeks. We will check the serum free light chains in 2 weeks.  He now requires 24-hour care in the home. His daughter will meet with the Cancer center social worker to discuss placement options.  He will discontinue the fentanyl patch. We decrease the Decadron dose to 20 mg weekly secondary to insomnia.  I discussed the case with Dr. Diona Fanti. He will see Richard [name] for management of the nocturia.   Betsy Coder, MD  01/03/2017  10:06 AM

## 2017-01-03 NOTE — Telephone Encounter (Signed)
Appointments scheduled per 12/6 LOS. Patient given AVS report and calendars with future scheduled appointments. °

## 2017-01-03 NOTE — Progress Notes (Signed)
[place] Work  Clinical Social Work was referred by Marine scientist and pt's daughter, Rip Harbour for assessment of psychosocial needs due to care needs.  Clinical Social Worker met with daughter in [place] office at [facility] to offer support and assess for needs.  Pt was discharged from [facility] yesterday as he has used up his 100 SNF days for the year. Daughter took him home to her house as that was only affordable option. Daughter works fulltime and was unaware of the level of care pt needed. [facility] did not make d/c plan per daughter, but she plans to call to inquire. CSW provided daughter list of CNAs, but that appears cost prohibitive as well. [facility] would have let pt stay, but at $167 a day. Daughter realizing that may be cheapest and best option for all involved. Paying for CNA daily, would cost more currently. She reports her father was up all night and required help to get to the bathroom. Daughter reports pt has bedside commode, but refuses to use it currently. Daughter reports she has reached out to hospice to explore palliative care as well. She is wondering if her pt is hospice eligible, but understands he would not continue his treatments.   Pt has very limited financial resources and could possibly qualify for LTC medicaid. CSW provided daughter with DSS contact information to explore further. She plans to reach out to [facility] for other possibilities as well. CSW provided info on [facility], DSS and others. CSW to follow closely. Daughter plans to reach out after exploring several options.   Clinical Social Work interventions:  Resource education and referral Supportive listening  Loren Racer, [place]  [place] Phone: [phone] Fax: [phone]

## 2017-01-05 ENCOUNTER — Other Ambulatory Visit: Payer: Self-pay | Admitting: Oncology

## 2017-01-06 ENCOUNTER — Other Ambulatory Visit: Payer: Self-pay | Admitting: Oncology

## 2017-01-07 ENCOUNTER — Encounter: Payer: Self-pay | Admitting: Oncology

## 2017-01-09 ENCOUNTER — Other Ambulatory Visit: Payer: Self-pay | Admitting: *Deleted

## 2017-01-09 ENCOUNTER — Ambulatory Visit: Payer: Medicare Other

## 2017-01-09 ENCOUNTER — Encounter: Payer: Self-pay | Admitting: *Deleted

## 2017-01-09 ENCOUNTER — Other Ambulatory Visit: Payer: Medicare Other

## 2017-01-09 NOTE — Progress Notes (Signed)
[place] Work  Clinical Social Work contacted pt's daughter to follow up on care needs. Per daughter, she went to DSS and pt does qualify for LTC medicaid, but he is not in need of that level of care currently. He really needs more of assisted living care currently. Daughter reports they are managing at home currently with help of friends and neighbors. Pt is staying at her home currently and this is going better. She will keep CSW updated as needs arise and appreciated call to check in. CSW to follow and assist as needed.   Clinical Social Work interventions: Check in  Loren Racer, [place] Social Worker [place]  [place] Phone: [phone] Fax: [phone]

## 2017-01-10 ENCOUNTER — Other Ambulatory Visit (HOSPITAL_BASED_OUTPATIENT_CLINIC_OR_DEPARTMENT_OTHER): Payer: Medicare Other

## 2017-01-10 ENCOUNTER — Ambulatory Visit (HOSPITAL_BASED_OUTPATIENT_CLINIC_OR_DEPARTMENT_OTHER): Payer: Medicare Other

## 2017-01-10 ENCOUNTER — Other Ambulatory Visit: Payer: Medicare Other

## 2017-01-10 VITALS — BP 124/56 | HR 96 | Temp 98.2°F | Resp 17

## 2017-01-10 DIAGNOSIS — C9002 Multiple myeloma in relapse: Secondary | ICD-10-CM

## 2017-01-10 DIAGNOSIS — Z5112 Encounter for antineoplastic immunotherapy: Secondary | ICD-10-CM | POA: Diagnosis not present

## 2017-01-10 DIAGNOSIS — C9000 Multiple myeloma not having achieved remission: Secondary | ICD-10-CM

## 2017-01-10 LAB — BASIC METABOLIC PANEL
ANION GAP: 9 meq/L (ref 3–11)
BUN: 21.1 mg/dL (ref 7.0–26.0)
CALCIUM: 10.1 mg/dL (ref 8.4–10.4)
CO2: 26 mEq/L (ref 22–29)
CREATININE: 0.8 mg/dL (ref 0.7–1.3)
Chloride: 91 mEq/L — ABNORMAL LOW (ref 98–109)
EGFR: 86 mL/min/{1.73_m2} — AB (ref >90)
GLUCOSE: 117 mg/dL (ref 70–140)
Potassium: 3.5 mEq/L (ref 3.5–5.1)
Sodium: 126 mEq/L — ABNORMAL LOW (ref 136–145)

## 2017-01-10 LAB — CBC WITH DIFFERENTIAL/PLATELET
BASO%: 0.8 % (ref 0.0–2.0)
BASOS ABS: 0 10*3/uL (ref 0.0–0.1)
EOS%: 0.6 % (ref 0.0–7.0)
Eosinophils Absolute: 0 10*3/uL (ref 0.0–0.5)
HCT: 30.6 % — ABNORMAL LOW (ref 38.4–49.9)
HGB: 10.1 g/dL — ABNORMAL LOW (ref 13.0–17.1)
LYMPH%: 11.9 % — ABNORMAL LOW (ref 14.0–49.0)
MCH: 28.8 pg (ref 27.2–33.4)
MCHC: 33.2 g/dL (ref 32.0–36.0)
MCV: 87 fL (ref 79.3–98.0)
MONO#: 0.6 10*3/uL (ref 0.1–0.9)
MONO%: 10.1 % (ref 0.0–14.0)
NEUT#: 4.2 10*3/uL (ref 1.5–6.5)
NEUT%: 76.6 % — AB (ref 39.0–75.0)
Platelets: 187 10*3/uL (ref 140–400)
RBC: 3.51 10*6/uL — ABNORMAL LOW (ref 4.20–5.82)
RDW: 18.6 % — ABNORMAL HIGH (ref 11.0–14.6)
WBC: 5.5 10*3/uL (ref 4.0–10.3)
lymph#: 0.7 10*3/uL — ABNORMAL LOW (ref 0.9–3.3)

## 2017-01-10 MED ORDER — PROCHLORPERAZINE MALEATE 10 MG PO TABS
10.0000 mg | ORAL_TABLET | Freq: Once | ORAL | Status: AC
  Administered 2017-01-10: 10 mg via ORAL

## 2017-01-10 MED ORDER — SODIUM CHLORIDE 0.9 % IV SOLN
15.7000 mg/kg | Freq: Once | INTRAVENOUS | Status: AC
  Administered 2017-01-10: 1200 mg via INTRAVENOUS
  Filled 2017-01-10: qty 60

## 2017-01-10 MED ORDER — METHYLPREDNISOLONE SODIUM SUCC 125 MG IJ SOLR
INTRAMUSCULAR | Status: AC
  Filled 2017-01-10: qty 2

## 2017-01-10 MED ORDER — DIPHENHYDRAMINE HCL 25 MG PO CAPS
ORAL_CAPSULE | ORAL | Status: AC
  Filled 2017-01-10: qty 2

## 2017-01-10 MED ORDER — MONTELUKAST SODIUM 10 MG PO TABS
10.0000 mg | ORAL_TABLET | Freq: Once | ORAL | Status: AC
  Administered 2017-01-10: 10 mg via ORAL

## 2017-01-10 MED ORDER — METHYLPREDNISOLONE SODIUM SUCC 125 MG IJ SOLR
125.0000 mg | Freq: Once | INTRAMUSCULAR | Status: AC
  Administered 2017-01-10: 125 mg via INTRAVENOUS

## 2017-01-10 MED ORDER — MONTELUKAST SODIUM 10 MG PO TABS
ORAL_TABLET | ORAL | Status: AC
  Filled 2017-01-10: qty 1

## 2017-01-10 MED ORDER — SODIUM CHLORIDE 0.9 % IV SOLN
Freq: Once | INTRAVENOUS | Status: AC
  Administered 2017-01-10: 09:00:00 via INTRAVENOUS

## 2017-01-10 MED ORDER — DIPHENHYDRAMINE HCL 25 MG PO CAPS
50.0000 mg | ORAL_CAPSULE | Freq: Once | ORAL | Status: AC
  Administered 2017-01-10: 50 mg via ORAL

## 2017-01-10 MED ORDER — ACETAMINOPHEN 325 MG PO TABS
650.0000 mg | ORAL_TABLET | Freq: Once | ORAL | Status: AC
  Administered 2017-01-10: 650 mg via ORAL

## 2017-01-10 MED ORDER — PROCHLORPERAZINE MALEATE 10 MG PO TABS
ORAL_TABLET | ORAL | Status: AC
  Filled 2017-01-10: qty 1

## 2017-01-10 MED ORDER — ACETAMINOPHEN 325 MG PO TABS
ORAL_TABLET | ORAL | Status: AC
  Filled 2017-01-10: qty 2

## 2017-01-10 NOTE — Patient Instructions (Signed)
[facility] Discharge Instructions for Patients Receiving Chemotherapy  Today you received the following chemotherapy agents Darzalex.  To help prevent nausea and vomiting after your treatment, we encourage you to take your nausea medication as directed.  If you develop nausea and vomiting that is not controlled by your nausea medication, call the clinic.   BELOW ARE SYMPTOMS THAT SHOULD BE REPORTED IMMEDIATELY:  *FEVER GREATER THAN 100.5 F  *CHILLS WITH OR WITHOUT FEVER  NAUSEA AND VOMITING THAT IS NOT CONTROLLED WITH YOUR NAUSEA MEDICATION  *UNUSUAL SHORTNESS OF BREATH  *UNUSUAL BRUISING OR BLEEDING  TENDERNESS IN MOUTH AND THROAT WITH OR WITHOUT PRESENCE OF ULCERS  *URINARY PROBLEMS  *BOWEL PROBLEMS  UNUSUAL RASH Items with * indicate a potential emergency and should be followed up as soon as possible.  Feel free to call the clinic you have any questions or concerns. The clinic phone number is [phone].  Please show the CHEMO ALERT CARD at check-in to the Emergency Department and triage nurse.    

## 2017-01-12 ENCOUNTER — Other Ambulatory Visit: Payer: Self-pay | Admitting: Oncology

## 2017-01-16 ENCOUNTER — Telehealth: Payer: Self-pay | Admitting: *Deleted

## 2017-01-16 NOTE — Telephone Encounter (Signed)
Call placed to pt.'s daughter, Rip Harbour and she states that [facility] nurse is still at pt.'s home evaluating patient.  Notified Rip Harbour that I would f/u with her tomorrow and requested that [facility] f/u with [facility] as well regarding patient's condition.

## 2017-01-16 NOTE — Telephone Encounter (Signed)
Call placed back to patient's daughter-Melinda.  Rip Harbour states that hospice is coming to visit her father this evening.  She states that pt has asked her for a gun multiple times and was found with a rope around his neck, apparently attempting to harm himself per [place].  Rip Harbour states that she did notify pt.'s PCP and they have sent a prescription in for Zoloft for patient.  Rip Harbour would like to know Dr. Gearldine Shown opinion regarding Zoloft.  Dr. Benay Spice notified of above information.  Melinda notified per order of Dr. Benay Spice that patient needs to go to the ED now for psych evaluation and to not start Zoloft at this time.  Rip Harbour appreciative of Dr. Gearldine Shown orders and does not want to take her father to the ED at this time.  Phone call ended d/t Rip Harbour needed to answer the door, hopeful it was hospice nurse.  Will follow up with [facility].

## 2017-01-16 NOTE — Telephone Encounter (Signed)
Message received from patient's daughter stating that patient will not be able to make it to his appts tomorrow, d/t his legs are weak and he is unable to get up with assistance.  Message states that pt fell today and pt.'s daughter had to call the fire department to help pt up.  Call back requested.

## 2017-01-17 ENCOUNTER — Telehealth: Payer: Self-pay | Admitting: *Deleted

## 2017-01-17 ENCOUNTER — Other Ambulatory Visit: Payer: Medicare Other

## 2017-01-17 ENCOUNTER — Ambulatory Visit: Payer: Medicare Other | Admitting: Oncology

## 2017-01-17 ENCOUNTER — Ambulatory Visit: Payer: Medicare Other

## 2017-01-17 NOTE — Telephone Encounter (Signed)
Message left on patient's daughters voicemail to check on patient's status.  Call received earlier today from Hall Busing with [facility] stating that she visited patient yesterday and that Dr. Deirdre Evener will be patient's attending MD with hospice.

## 2017-02-07 ENCOUNTER — Telehealth: Payer: Self-pay | Admitting: *Deleted

## 2017-02-07 NOTE — Telephone Encounter (Signed)
Call placed to patient's daughter, Rip Harbour to check on patient's status.  She states that patient is currently staying at her home with hospice care and that hospice has informed Rip Harbour they believe her father has approximately one month to live.  Rip Harbour appreciative of call and has no needs from [facility] at this time.

## 2017-02-25 ENCOUNTER — Telehealth: Payer: Self-pay | Admitting: *Deleted

## 2017-02-25 NOTE — Telephone Encounter (Signed)
Telephone call to daughter- Patient is declining slowly. He has some good days and some not so good days. Rip Harbour states he has not been in any pain. He has some difficulty speaking which is new but otherwise he is in good spirits. The family is doing well and spending time with him. They are all dealing well with the end of life process. Rip Harbour thanked me for the call.  She knows she can call us if she needs anything whatsoever.

## 2017-03-06 ENCOUNTER — Telehealth: Payer: Self-pay | Admitting: *Deleted

## 2017-03-06 NOTE — Telephone Encounter (Signed)
Call from pt's daughter with an update. She wonders if pt's light chains had actually improved after the treatments. Per Richard [name], he is in no pain. He continues to lose weight and his skin seems thin. Breathing is the same.  Offered our support. Clarified with her that he can still be seen by a provider if needed.

## 2017-04-08 DEATH — deceased

## 2017-04-30 ENCOUNTER — Telehealth: Payer: Self-pay | Admitting: *Deleted

## 2017-04-30 NOTE — Telephone Encounter (Signed)
Message left with patient's daughter to call Sharon back with patient's status.

## 2017-06-14 ENCOUNTER — Other Ambulatory Visit: Payer: Self-pay | Admitting: Nurse Practitioner

## 2018-03-29 IMAGING — CT CT HEAD W/O CM
3 of 4 series · 15 of 47 positions shown, 18 images · non-contrast
Comparison: 05/01/2012

CLINICAL DATA: Altered mental status, weakness

EXAM:
CT HEAD WITHOUT CONTRAST
TECHNIQUE: Contiguous axial images were obtained from the base of the skull
through the vertex without intravenous contrast.

[Series 2: head w/o · axial · non-contrast · 0.43mm/px · z∈[-197,-72]mm · 9 of 31 slices shown, 12 images]
[im 3/31  brain]
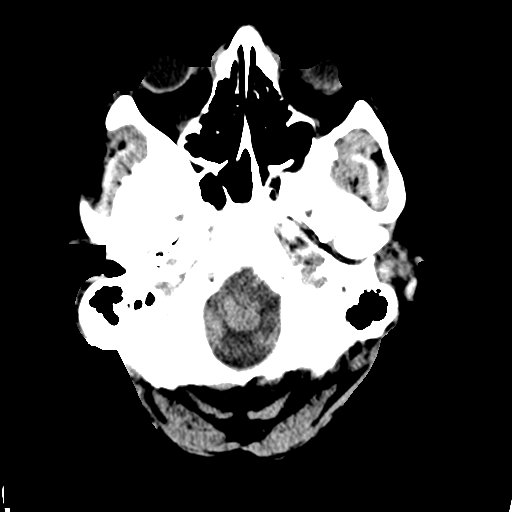
[im 3/31  bone]
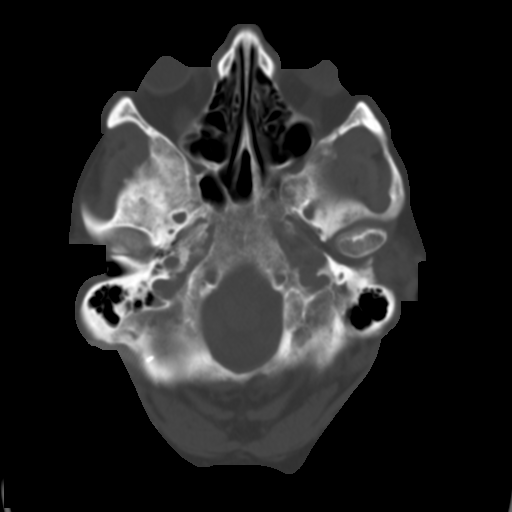
[im 7/31  brain]
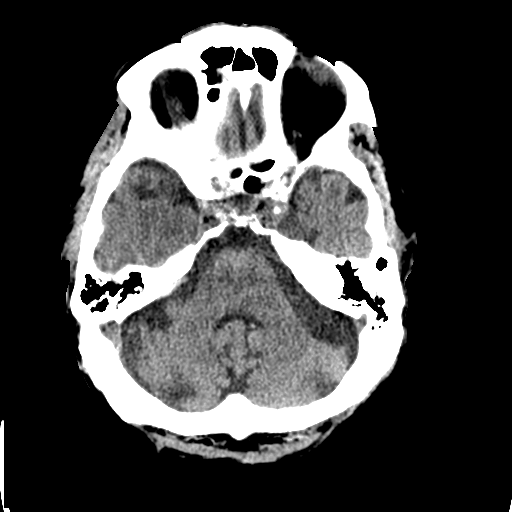
[im 9/31  brain]
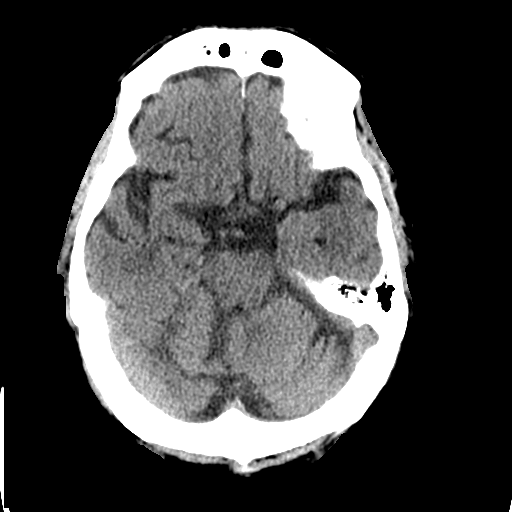
[im 13/31  brain]
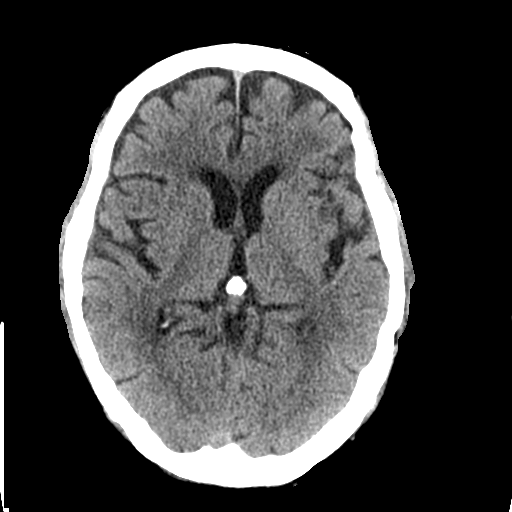
[im 16/31  brain]
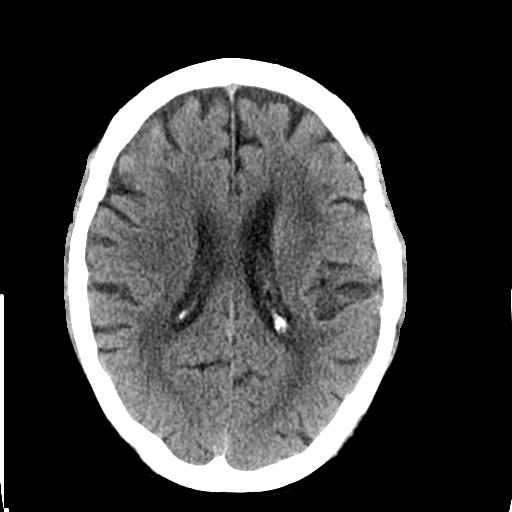
[im 16/31  bone]
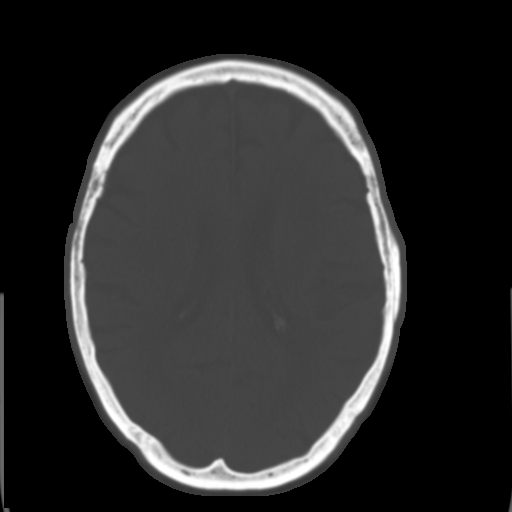
[im 18/31  brain]
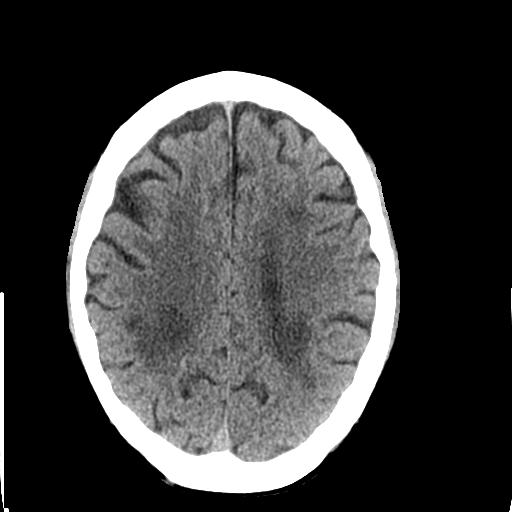
[im 22/31  brain]
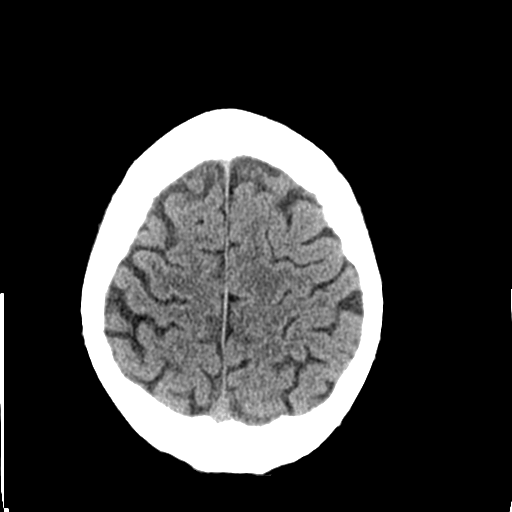
[im 24/31  brain]
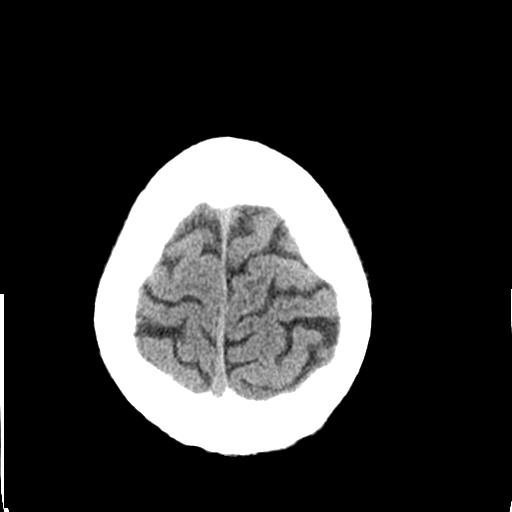
[im 28/31  brain]
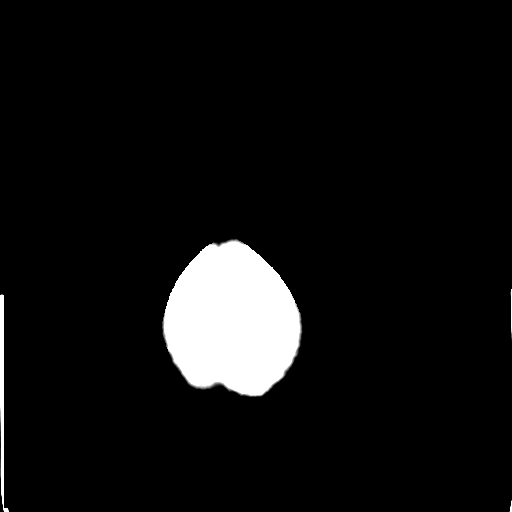
[im 28/31  bone]
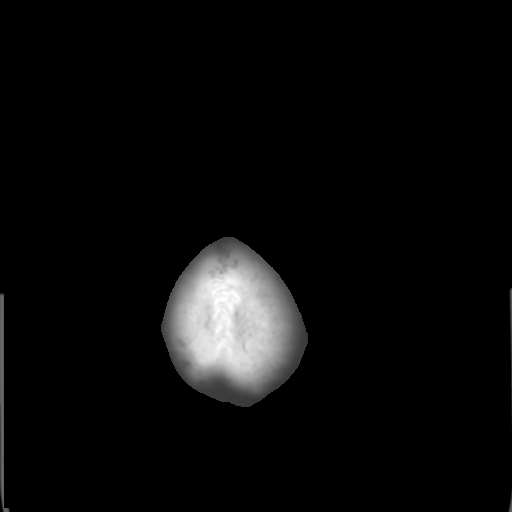

[Series 5: coronal · coronal · 0.29mm/px · 3 of 71 slices shown]
[im 24/71  brain]
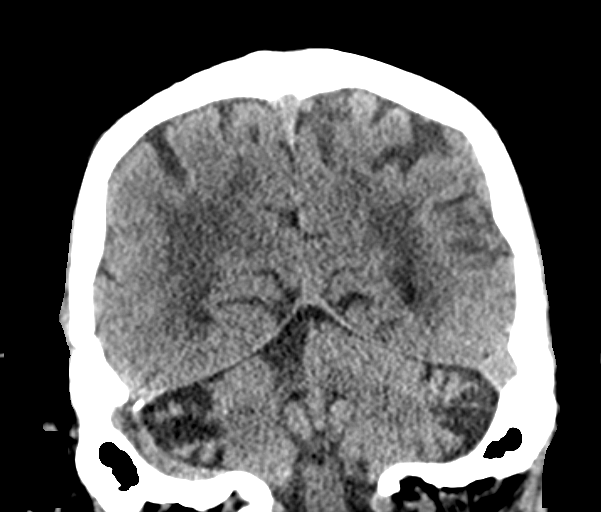
[im 32/71  brain]
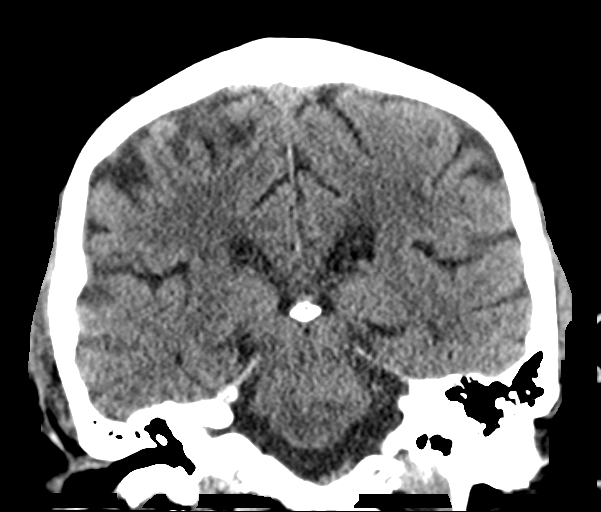
[im 39/71  brain]
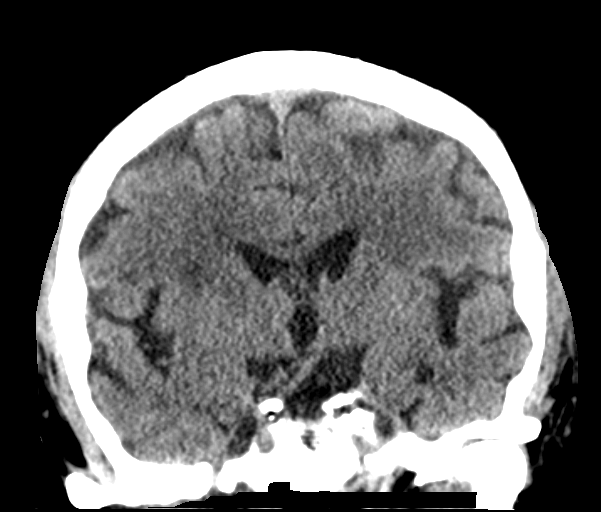

[Series 6: sagittal · sagittal · 0.30mm/px · 3 of 55 slices shown]
[im 19/55  brain]
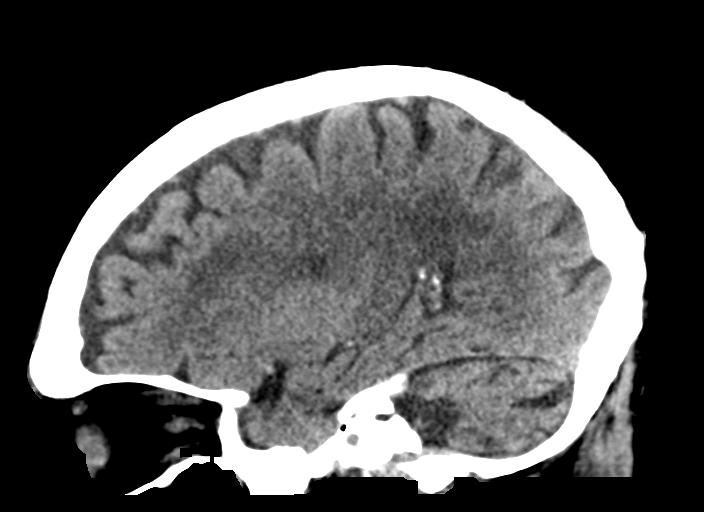
[im 28/55  brain]
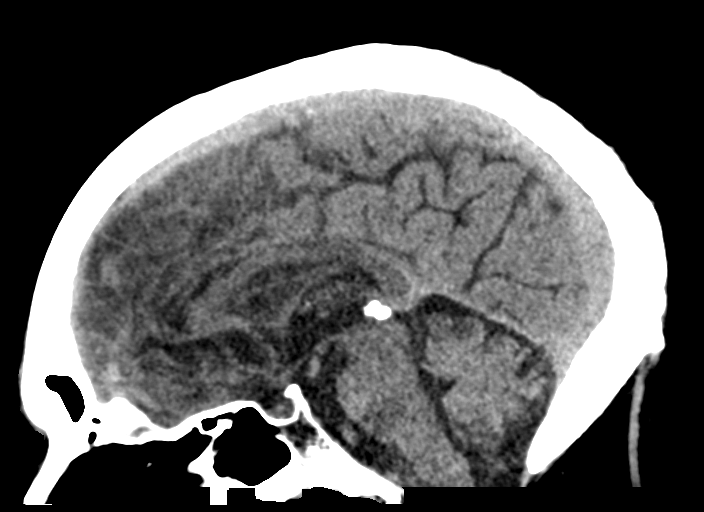
[im 37/55  brain]
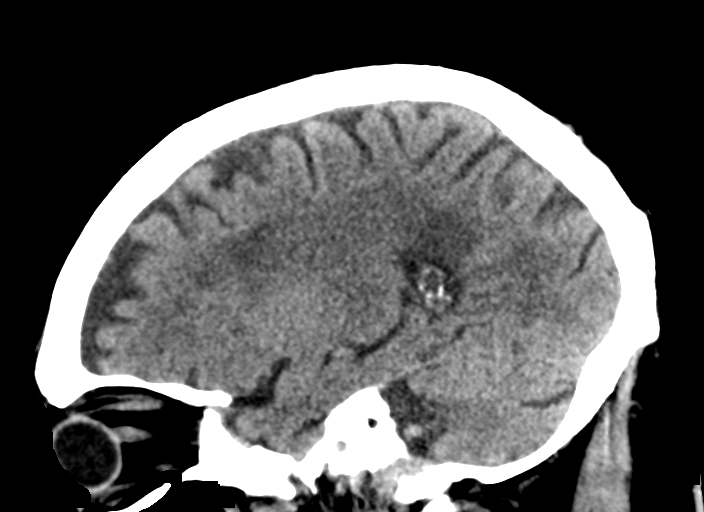

[15 of 47 positions shown; findings below may reference images not displayed]

FINDINGS: Brain: No evidence of acute infarction, hemorrhage, extra-axial
collection, ventriculomegaly, or mass effect. Generalized cerebral
atrophy. Periventricular white matter low attenuation likely
secondary to microangiopathy.

Vascular: Cerebrovascular atherosclerotic calcifications are noted.

Skull: Negative for fracture or focal lesion. Old craniotomy defect
in the right occipital region.

Sinuses/Orbits: Visualized portions of the orbits are unremarkable.
Visualized portions of the paranasal sinuses and mastoid air cells
are unremarkable.

Other: None.
IMPRESSION: 1. No acute intracranial pathology.
2. Chronic microvascular disease and cerebral atrophy.
